# Patient Record
Sex: Male | Born: 1965
Health system: Southern US, Community
[De-identification: ages and names within clinical notes are randomized; demographics above are authoritative.]

## PROBLEM LIST (undated history)

## (undated) DIAGNOSIS — M8700 Idiopathic aseptic necrosis of unspecified bone: Secondary | ICD-10-CM

## (undated) DIAGNOSIS — I10 Essential (primary) hypertension: Secondary | ICD-10-CM

## (undated) DIAGNOSIS — D573 Sickle-cell trait: Secondary | ICD-10-CM

## (undated) DIAGNOSIS — D869 Sarcoidosis, unspecified: Secondary | ICD-10-CM

## (undated) DIAGNOSIS — D571 Sickle-cell disease without crisis: Secondary | ICD-10-CM

## (undated) HISTORY — DX: Sarcoidosis, unspecified: D86.9

## (undated) HISTORY — DX: Sickle-cell trait: D57.3

---

## 1998-05-14 ENCOUNTER — Emergency Department (HOSPITAL_COMMUNITY): Admission: EM | Admit: 1998-05-14 | Discharge: 1998-05-14 | Payer: Self-pay | Admitting: Emergency Medicine

## 2000-02-23 ENCOUNTER — Emergency Department (HOSPITAL_COMMUNITY): Admission: EM | Admit: 2000-02-23 | Discharge: 2000-02-23 | Payer: Self-pay | Admitting: Emergency Medicine

## 2002-09-14 ENCOUNTER — Encounter: Admission: RE | Admit: 2002-09-14 | Discharge: 2002-09-14 | Payer: Self-pay | Admitting: Family Medicine

## 2002-09-14 ENCOUNTER — Encounter: Payer: Self-pay | Admitting: Family Medicine

## 2003-11-01 ENCOUNTER — Encounter: Admission: RE | Admit: 2003-11-01 | Discharge: 2003-11-01 | Payer: Self-pay | Admitting: Family Medicine

## 2004-02-10 ENCOUNTER — Emergency Department (HOSPITAL_COMMUNITY): Admission: EM | Admit: 2004-02-10 | Discharge: 2004-02-11 | Payer: Self-pay | Admitting: Emergency Medicine

## 2004-02-19 ENCOUNTER — Emergency Department (HOSPITAL_COMMUNITY): Admission: EM | Admit: 2004-02-19 | Discharge: 2004-02-19 | Payer: Self-pay | Admitting: *Deleted

## 2004-05-22 ENCOUNTER — Emergency Department (HOSPITAL_COMMUNITY): Admission: EM | Admit: 2004-05-22 | Discharge: 2004-05-22 | Payer: Self-pay | Admitting: Emergency Medicine

## 2004-06-03 ENCOUNTER — Ambulatory Visit: Admission: RE | Admit: 2004-06-03 | Discharge: 2004-06-03 | Payer: Self-pay | Admitting: Internal Medicine

## 2004-06-03 ENCOUNTER — Encounter (INDEPENDENT_AMBULATORY_CARE_PROVIDER_SITE_OTHER): Payer: Self-pay | Admitting: Specialist

## 2004-09-29 ENCOUNTER — Ambulatory Visit: Payer: Self-pay | Admitting: Internal Medicine

## 2004-10-09 ENCOUNTER — Emergency Department (HOSPITAL_COMMUNITY): Admission: EM | Admit: 2004-10-09 | Discharge: 2004-10-09 | Payer: Self-pay | Admitting: Emergency Medicine

## 2004-10-19 ENCOUNTER — Emergency Department (HOSPITAL_COMMUNITY): Admission: EM | Admit: 2004-10-19 | Discharge: 2004-10-19 | Payer: Self-pay | Admitting: Emergency Medicine

## 2004-11-17 ENCOUNTER — Ambulatory Visit: Payer: Self-pay | Admitting: Internal Medicine

## 2004-12-17 ENCOUNTER — Ambulatory Visit: Payer: Self-pay | Admitting: Internal Medicine

## 2005-04-07 ENCOUNTER — Ambulatory Visit: Payer: Self-pay | Admitting: Internal Medicine

## 2005-05-24 ENCOUNTER — Ambulatory Visit: Payer: Self-pay | Admitting: Internal Medicine

## 2005-07-15 ENCOUNTER — Ambulatory Visit: Payer: Self-pay | Admitting: Internal Medicine

## 2005-08-16 ENCOUNTER — Ambulatory Visit: Payer: Self-pay | Admitting: Internal Medicine

## 2005-10-04 HISTORY — PX: SHOULDER OPEN ROTATOR CUFF REPAIR: SHX2407

## 2006-06-02 ENCOUNTER — Ambulatory Visit: Payer: Self-pay | Admitting: Internal Medicine

## 2006-06-23 ENCOUNTER — Ambulatory Visit: Payer: Self-pay | Admitting: Internal Medicine

## 2006-07-15 ENCOUNTER — Ambulatory Visit (HOSPITAL_COMMUNITY): Admission: RE | Admit: 2006-07-15 | Discharge: 2006-07-15 | Payer: Self-pay | Admitting: Orthopaedic Surgery

## 2006-08-16 ENCOUNTER — Encounter: Admission: RE | Admit: 2006-08-16 | Discharge: 2006-09-22 | Payer: Self-pay | Admitting: Orthopaedic Surgery

## 2007-11-10 DIAGNOSIS — D869 Sarcoidosis, unspecified: Secondary | ICD-10-CM | POA: Insufficient documentation

## 2007-11-27 ENCOUNTER — Emergency Department (HOSPITAL_COMMUNITY): Admission: EM | Admit: 2007-11-27 | Discharge: 2007-11-27 | Payer: Self-pay | Admitting: Emergency Medicine

## 2007-12-08 ENCOUNTER — Ambulatory Visit: Payer: Self-pay | Admitting: Internal Medicine

## 2007-12-25 ENCOUNTER — Ambulatory Visit: Payer: Self-pay | Admitting: Internal Medicine

## 2008-02-01 ENCOUNTER — Encounter: Admission: RE | Admit: 2008-02-01 | Discharge: 2008-02-01 | Payer: Self-pay | Admitting: Family Medicine

## 2008-02-01 ENCOUNTER — Ambulatory Visit: Payer: Self-pay | Admitting: Family Medicine

## 2008-02-01 ENCOUNTER — Encounter (INDEPENDENT_AMBULATORY_CARE_PROVIDER_SITE_OTHER): Payer: Self-pay | Admitting: Family Medicine

## 2008-02-01 DIAGNOSIS — G4700 Insomnia, unspecified: Secondary | ICD-10-CM | POA: Insufficient documentation

## 2008-02-02 LAB — CONVERTED CEMR LAB
AST: 20 units/L (ref 0–37)
Albumin: 4.5 g/dL (ref 3.5–5.2)
Chlamydia, Swab/Urine, PCR: NEGATIVE
Cholesterol: 196 mg/dL (ref 0–200)
GC Probe Amp, Urine: NEGATIVE
HCT: 45 % (ref 39.0–52.0)
HDL: 91 mg/dL (ref >39)
Hemoglobin: 16 g/dL (ref 13.0–17.0)
MCHC: 35.6 g/dL (ref 30.0–36.0)
PSA: 1.61 ng/mL (ref 0.10–4.00)
Platelets: 246 10*3/uL (ref 150–400)
Potassium: 4.2 meq/L (ref 3.5–5.3)
RDW: 14.3 % (ref 11.5–15.5)
TSH: 1.729 microintl units/mL (ref 0.350–5.50)
Total CHOL/HDL Ratio: 2.2
VLDL: 13 mg/dL (ref 0–40)

## 2008-08-07 ENCOUNTER — Encounter (INDEPENDENT_AMBULATORY_CARE_PROVIDER_SITE_OTHER): Payer: Self-pay | Admitting: Family Medicine

## 2008-12-13 ENCOUNTER — Ambulatory Visit: Payer: Self-pay | Admitting: Adult Health

## 2008-12-13 ENCOUNTER — Ambulatory Visit: Payer: Self-pay | Admitting: Pulmonary Disease

## 2008-12-13 ENCOUNTER — Encounter: Payer: Self-pay | Admitting: Gastroenterology

## 2008-12-17 ENCOUNTER — Encounter: Payer: Self-pay | Admitting: Adult Health

## 2008-12-17 ENCOUNTER — Telehealth: Payer: Self-pay | Admitting: Adult Health

## 2008-12-17 LAB — CONVERTED CEMR LAB: Angiotensin 1 Converting Enzyme: 55 units/L (ref 9–67)

## 2008-12-18 ENCOUNTER — Ambulatory Visit: Payer: Self-pay | Admitting: Internal Medicine

## 2009-01-02 ENCOUNTER — Ambulatory Visit: Payer: Self-pay | Admitting: Internal Medicine

## 2009-01-02 DIAGNOSIS — J4550 Severe persistent asthma, uncomplicated: Secondary | ICD-10-CM | POA: Insufficient documentation

## 2009-01-20 ENCOUNTER — Ambulatory Visit: Payer: Self-pay | Admitting: Family Medicine

## 2009-01-20 ENCOUNTER — Encounter (INDEPENDENT_AMBULATORY_CARE_PROVIDER_SITE_OTHER): Payer: Self-pay | Admitting: Family Medicine

## 2009-01-20 DIAGNOSIS — J3089 Other allergic rhinitis: Secondary | ICD-10-CM | POA: Insufficient documentation

## 2009-01-20 DIAGNOSIS — F1010 Alcohol abuse, uncomplicated: Secondary | ICD-10-CM | POA: Insufficient documentation

## 2009-01-21 LAB — CONVERTED CEMR LAB
Albumin: 4.6 g/dL (ref 3.5–5.2)
Alkaline Phosphatase: 52 units/L (ref 39–117)
BUN: 17 mg/dL (ref 6–23)
Calcium: 10.1 mg/dL (ref 8.4–10.5)
Creatinine, Ser: 0.98 mg/dL (ref 0.40–1.50)
Glucose, Bld: 118 mg/dL — ABNORMAL HIGH (ref 70–99)
MCV: 81.1 fL (ref 78.0–100.0)
RBC: 5.56 M/uL (ref 4.22–5.81)
RDW: 14.4 % (ref 11.5–15.5)
Sodium: 139 meq/L (ref 135–145)
WBC: 6 10*3/uL (ref 4.0–10.5)

## 2009-02-07 ENCOUNTER — Ambulatory Visit: Payer: Self-pay | Admitting: Internal Medicine

## 2009-11-03 ENCOUNTER — Encounter: Payer: Self-pay | Admitting: Family Medicine

## 2009-11-21 ENCOUNTER — Encounter: Payer: Self-pay | Admitting: *Deleted

## 2009-11-21 ENCOUNTER — Ambulatory Visit: Payer: Self-pay | Admitting: Family Medicine

## 2009-12-04 ENCOUNTER — Encounter: Payer: Self-pay | Admitting: *Deleted

## 2010-03-04 ENCOUNTER — Encounter: Payer: Self-pay | Admitting: Internal Medicine

## 2010-03-04 ENCOUNTER — Ambulatory Visit: Payer: Self-pay | Admitting: Internal Medicine

## 2010-03-09 ENCOUNTER — Encounter: Payer: Self-pay | Admitting: Internal Medicine

## 2010-03-13 ENCOUNTER — Telehealth: Payer: Self-pay | Admitting: Internal Medicine

## 2010-03-18 ENCOUNTER — Telehealth: Payer: Self-pay | Admitting: Family Medicine

## 2010-03-19 ENCOUNTER — Ambulatory Visit: Payer: Self-pay | Admitting: Family Medicine

## 2010-03-19 ENCOUNTER — Encounter: Payer: Self-pay | Admitting: Family Medicine

## 2010-03-19 ENCOUNTER — Encounter: Payer: Self-pay | Admitting: *Deleted

## 2010-03-20 ENCOUNTER — Encounter: Payer: Self-pay | Admitting: *Deleted

## 2010-03-20 ENCOUNTER — Telehealth: Payer: Self-pay | Admitting: *Deleted

## 2010-04-13 ENCOUNTER — Ambulatory Visit: Payer: Self-pay | Admitting: Internal Medicine

## 2010-05-13 ENCOUNTER — Telehealth: Payer: Self-pay | Admitting: Internal Medicine

## 2010-07-26 ENCOUNTER — Encounter: Payer: Self-pay | Admitting: Family Medicine

## 2010-10-06 ENCOUNTER — Ambulatory Visit: Admit: 2010-10-06 | Payer: Self-pay | Admitting: Internal Medicine

## 2010-10-19 ENCOUNTER — Encounter: Payer: Self-pay | Admitting: Family Medicine

## 2010-10-19 ENCOUNTER — Ambulatory Visit: Admission: RE | Admit: 2010-10-19 | Discharge: 2010-10-19 | Payer: Self-pay | Source: Home / Self Care

## 2010-10-19 DIAGNOSIS — M25569 Pain in unspecified knee: Secondary | ICD-10-CM | POA: Insufficient documentation

## 2010-10-19 LAB — CONVERTED CEMR LAB
Chloride: 101 meq/L (ref 96–112)
Glucose, Bld: 84 mg/dL (ref 70–99)
HCT: 44 % (ref 39.0–52.0)
HIV: NONREACTIVE
MCV: 80.1 fL (ref 78.0–100.0)
Platelets: 216 10*3/uL (ref 150–400)
Potassium: 4.3 meq/L (ref 3.5–5.3)
RBC: 5.49 M/uL (ref 4.22–5.81)
RDW: 14.1 % (ref 11.5–15.5)
Sodium: 138 meq/L (ref 135–145)
Total Protein: 7.9 g/dL (ref 6.0–8.3)

## 2010-10-21 ENCOUNTER — Encounter: Payer: Self-pay | Admitting: Family Medicine

## 2010-11-03 ENCOUNTER — Ambulatory Visit: Admit: 2010-11-03 | Payer: Self-pay

## 2010-11-03 NOTE — Assessment & Plan Note (Signed)
Summary: Pulmonary/ ext f/u ov with hfa teaching at 75%   Primary Provider/Referring Provider:  Marisue Ivan  MD  CC:  SOB/Wheezing/...paztient states he is working with chenicals and SOB has been worsened....patient  states he just found out he has been exposed to asbestos for last 5 yrs.  History of Present Illness: 45 yobm former smoker quit around 1995  exp to second hand smoke only dx with sarcoid 8/05 by tbbx on prednisone off and on since then with inconsistent adherence documented.   December 13, 2008--Last seen 3/09- did not return for follow up. Since last viist. Tapered off prednisone, he has taken on/off over last year. Would take when he was dyspneic, wheezing/ and cough. Over last 2 weeks increased cough, tightness, dyspnea, and wheezing. Almost out of prednisone. --CXR hyperinflation of the lungs with enlarged hilar tissue.  The hilar tissue has minimally changed. ACE at 55. Prendisone restarted at 20mg  once daily   December 18, 2008-- Returns compalining that breathing  no better. still has increased SOB since last OV, wheezing, prod cough with white sputum - worse since last OV. Pt misunderstood instructions, only on prednisone 10mg  once daily. Rec 20 mg per day  January 02, 2009 still on 20 mg per day and finally turning the corner with less sob and cough.  rec Prednisone 10 mg 2 each am until 100% better, then 1 daily x 2weeks then one half daily until seen  Feb 07, 2009 ov better sob, much less doe, less SABA use, not using symbicort consistently and so hasn't used up the 80 yet thought should have long before ov.     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 Pt denies any significant sore throat, dysphagia, itching, sneezing,  nasal congestion or excess secretions,  fever, chills, sweats, unintended wt loss, pleuritic or exertional cp,  hempoptysis, change in activity tolerance  orthopnea pnd or leg swelling. increasing need for saba x sev weeks, less responsive now and therefore increased prednisone to 20 mg per day x 2 weeks, minimally better.  Current Medications (verified): 1)  Prednisone 10 Mg  Tabs (Prednisone) .... One Half Daily 2)  Ventolin Hfa 108 (90 Base) Mcg/act Aers (Albuterol Sulfate) .... 2 Puffs Every 4 Hours As Needed  Allergies (verified): No Known Drug Allergies  Past History:  Past Medical History: SARCOIDOSIS (ICD-135).........................................................Marland KitchenWert    - FOB with lung biopsy 2005 positive NCG> variably on prednisone since then Sickle Cell Trait Asthma   - MDI 50% March 04, 2010   Family History: Reviewed history from 02/01/2008 and no changes required. Pos asthma, mother Mother - Sickle Cell Disease, Asthma, CAD s/p MI (prior to 43 yo), s/p CVA (frequent hospitalizations) Father - passed in 45 (59 yo) from Prostate Cancer  4 siblings Sister - multiple medical problems - details unk Brother - ? Cancer Brother - died 2/2 drug abuse  Social History: Married.  Lives with wife [name] and kids.  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  Quit smoking 1995.  Drinks 1 bottle Brandy every other day.  No drugs or hx of IVDU.  Does lots of walking with job, but no regular exercise.  Vital Signs:  Patient profile:   45 year old male Height:      68.5 inches Weight:  141 pounds O2 Sat:      97 % on Room air Temp:     97.9 degrees F Pulse rate:   116 / minute  Vitals Entered By: Vernie Murders (March 04, 2010 10:29 AM)  O2 Sat at Rest %:  97% O2 Flow:  Room air  Reason for Visit patients states SOB has worsened since he has been around chemicals x 1 month, pt states he chest feels tight, coughing which is productive and acconmpanied by brown sputum. pt c/o of feeling lightheaded at end of work day. Vernie Murders  March 04, 2010 10:32 AM   Physical Exam  Additional Exam:  Ambulatory healthy appearing in no acute distress.  wt 142 > 143 Feb 08, 2009 > 141 March 04, 2010  HEENT: nl dentition, turbinates, and orophanx. Nl external ear canals without cough reflex Neck without JVD/Nodes/TM Lungs bilateral mid  exp wheezes and mod increase exp time RRR no s3 or murmur or increase in P2 Abd soft and benign with nl excursion in the supine position. No bruits or organomegaly Ext warm without calf tenderness, cyanosis clubbing or edema Skin warm and dry without lesions   CXR  Procedure date:  03/04/2010  Findings:      IMPRESSION: Similar bilateral hilar soft tissue fullness.  Likely related to the history of sarcoidosis.  No acute superimposed process identified.  Impression & Recommendations:  Problem # 1:  ASTHMA (ICD-493.90)  DDX of  difficult airways managment all start with A and  include Adherence, Ace Inhibitors, Acid Reflux, Active Sinus Disease, Alpha 1 Antitripsin deficiency, Anxiety masquerading as Airways dz,  ABPA,  allergy(esp in young), Aspiration (esp in elderly), Adverse effects of DPI,  Active smokers, plus one B  = Beta blocker use..   In this case Adherence is the biggest issue and starts with  inability to use HFA effectively and also  understand that SABA treats the symptoms but doesn't get to the underlying problem (inflammation).  I used  the ananology of putting steroid cream on a rash to help explain the meaning of topical therapy and the need to get the drug to the target tissue.  Rec challenge with dulera 200  I spent extra time with the patient today explaining optimal mdi  technique.  This improved from  25-75%  Problem # 2:  SARCOIDOSIS (ICD-135) No evidence of progression of ild but clearly this could be the cause of poor airway control  The goal with a chronic steroid dependent illness is always arriving at the lowest effective dose that controls the disease/symptoms and not  accepting a set "formula" which is based on statistics that don't take into accound individual variability or the natural hx of the dz in every individual patient, which may well vary over time. for now use ceiling of 20 and floor of 10  Medications Added to Medication List This Visit: 1)  Dulera 200-5 Mcg/act Aero (Mometasone furo-formoterol fum) .... 2 puffs first thing  in am and 2 puffs again in pm about 12 hours later 2)  Prednisone 10 Mg Tabs (Prednisone) .... 2 daily until better then taper to one half daily once better 3)  Proair Hfa 108 (90 Base) Mcg/act Aers (Albuterol sulfate) .Marland Kitchen.. 1-2 puffs every 4-6 hours as needed  Other Orders: T-2 View CXR (71020TC) Est. Patient Level IV ([address])  Patient Instructions: 1)  Start dulera 200/5 2 puffs first thing  in am and 2 puffs again in pm about  12 hours later  2)  Work on inhaler technique:  relax and blow all the way out then take a nice smooth deep breath back in, triggering the inhaler at same time you start breathing in  3)  ok to use proaire 2 puffs every 4 hours if needed 4)  Prednisone 10 mg 2 daily until better then taper to onehalf daily 5)  Please schedule a follow-up appointment in 4 weeks, sooner if needed with cxr on return  Prevention & Chronic Care Immunizations   Influenza vaccine: Not documented    Tetanus booster: Not documented    Pneumococcal vaccine: Not documented  Other Screening   Smoking status: never  (11/03/2009)  Lipids   Total Cholesterol: 196  (02/01/2008)   LDL: 92  (02/01/2008)   LDL Direct: Not documented   HDL: 91  (02/01/2008)   Triglycerides: 63  (02/01/2008)    CXR  Procedure date:  03/04/2010  Findings:      IMPRESSION: Similar bilateral hilar soft tissue fullness.  Likely related to the history of sarcoidosis.  No acute superimposed process identified.  Appended Document: Pulmonary/ ext f/u ov with hfa teaching at 75% Reviewed

## 2010-11-03 NOTE — Assessment & Plan Note (Signed)
Summary: pt no show   Primary Provider/Referring Provider:  Marisue Ivan  MD   History of Present Illness: pt no show  Allergies: No Known Drug Allergies   Other Orders: No Charge Patient Arrived (NCPA0) (NCPA0)

## 2010-11-03 NOTE — Assessment & Plan Note (Signed)
Summary: Reschedule CPE in 01/2010   Vital Signs:  Patient profile:   45 year old male Height:      68.5 inches Weight:      142 pounds BMI:     21.35 O2 Sat:      96 % on Room air Temp:     98.1 degrees F oral Pulse rate:   102 / minute BP sitting:   141 / 96  (left arm) Cuff size:   regular  Vitals Entered By: Gladstone Pih (November 03, 2009 2:23 PM)  O2 Flow:  Room air CC: CPE, C/O wheezing Is Patient Diabetic? No Pain Assessment Patient in pain? no        CC:  CPE and C/O wheezing.  Habits & Providers  Alcohol-Tobacco-Diet     Tobacco Status: never  Allergies: No Known Drug Allergies   Complete Medication List: 1)  Prednisone 10 Mg Tabs (Prednisone) .... One half daily 2)  Symbicort 160-4.5 Mcg/act Aero (Budesonide-formoterol fumarate) .... 2 puffs inhaled two times a day - brush teeth after using 3)  Ventolin Hfa 108 (90 Base) Mcg/act Aers (Albuterol sulfate) .... 2 puffs every 4 hours as needed 4)  Aerochamber Plus Misc (Spacer/aero-holding chambers) .... Please dispense appropriate spacer to use with hfa inhaler - instruct on proper use  Other Orders: Pulse Oximetry- [facility] (94760) No Charge Patient Arrived (NCPA0) (NCPA0) Prescriptions: SYMBICORT 160-4.5 MCG/ACT AERO (BUDESONIDE-FORMOTEROL FUMARATE) 2 puffs inhaled two times a day - brush teeth after using  #1 x 2   Entered and Authorized by:   Marisue Ivan  MD   Signed by:   Marisue Ivan  MD on 11/03/2009   Method used:   Electronically to        [facility] [phone]* (retail)       [address]       [place], [place]  [address]       Ph: [phone]       Fax: [phone]   RxID:   [id number]

## 2010-11-03 NOTE — Letter (Signed)
Summary: Out of Work  [facility]  [address]   [place], [place] [address]   Phone: [phone]  Fax: [phone]    April 13, 2010   Employee:  [name]    To Whom It May Concern:   For Medical reasons, please excuse the above named employee from work for the following dates:  Start:   July 11th, 2011  End:   July 11th, 2011  If you need additional information, please feel free to contact our office.         Sincerely,    Sandrea Hughs, MD

## 2010-11-03 NOTE — Letter (Signed)
Summary: Out of Work  [facility]  [address]   [place], [place] [address]   Phone: [phone]  Fax: [phone]    November 21, 2009   Employee:  [name]    To Whom It May Concern:   For Medical reasons, please excuse the above named employee from work for the following dates:  Start:   11/21/2009    End:   11/24/2009  If you need additional information, please feel free to contact our office.         Sincerely,    Loralee Pacas CMA

## 2010-11-03 NOTE — Miscellaneous (Signed)
Summary: ROI/TS  Clinical Lists Changes ROI FAXED TO [facility].Arlyss Repress CMA,  March 19, 2010 10:33 AM

## 2010-11-03 NOTE — Miscellaneous (Signed)
  Clinical Lists Changes  Problems: Changed problem from ASTHMA (ICD-493.90) to ASTHMA, PERSISTENT (ICD-493.90) 

## 2010-11-03 NOTE — Letter (Signed)
Summary: Generic Electronics engineer Pulmonary  [address]   [place], [place] [address]   Phone: [phone]  Fax: [phone]    03/09/2010  [facility] [address] [place], [place]  [address]  Dear Mr. Claycomb,   We have tried to reach you with recent test results, but were unfortunately unable to speak with you.  Please contact our office for your results at [phone].  Thank You.        Sincerely,   Nature conservation officer Pulmonary Division

## 2010-11-03 NOTE — Progress Notes (Signed)
Summary: nos appt  Phone Note Call from Patient   Caller: [email or web address]  Call For: wert Summary of Call: LMTCB x2 to rsc nos from 8/9. Initial call taken by: Darletta Moll,  May 13, 2010 3:17 PM

## 2010-11-03 NOTE — Miscellaneous (Signed)
Summary: heel cushions  Clinical Lists Changes  Orders: Added new Service order of Heel Cushions [phone]) - Signed.Loralee Pacas CMA  December 04, 2009 9:32 AM

## 2010-11-03 NOTE — Progress Notes (Signed)
Summary: note needed   Phone Note Call from Patient Call back at Home Phone [phone]   Caller: Patient Summary of Call: Pt needs note saying he is going back to work Monday.  Was seen yesterday.  Fax to [phone]. Initial call taken by: Clydell Hakim,  March 20, 2010 9:11 AM    note written and faxed to number given.Loralee Pacas CMA  March 20, 2010 5:05 PM

## 2010-11-03 NOTE — Assessment & Plan Note (Signed)
Summary: feet feel like they have needles in them,tcb   Vital Signs:  Patient profile:   45 year old male Weight:      143.9 pounds Temp:     98.2 degrees F oral Pulse rate:   81 / minute Pulse rhythm:   regular BP sitting:   146 / 96  (right arm) Cuff size:   regular  Vitals Entered By: Loralee Pacas CMA (November 21, 2009 8:53 AM) CC: foot pain Pain Assessment Patient in pain? yes     Location: foot Intensity: 8 Comments left foot pain starts at high arch and radiates into his heel,  makes it difficult to walk and pain is worse at night.   Primary Care Provider:  Marisue Ivan  MD  CC:  foot pain.  History of Present Illness: 1.  foot pain--left heel.  past 3 weeks.  feels like a toothache.  worse first thing in the morning when he tries to step on it.  does a lot of walking at work.  has not tried any meds.  has tried soaking in epsom salt, which helps a little.   Pt agreed to video precepting.Loralee Pacas CMA  November 21, 2009 9:03 AM  Allergies: No Known Drug Allergies  Review of Systems General:  Denies fever and malaise. MS:  Denies joint pain, joint swelling, and loss of strength.  Physical Exam  General:  Well-developed,well-nourished,in no acute distress; alert,appropriate and cooperative throughout examination Msk:  normal stance.  limping gait, favoring right.  left foot/ankle:  ttp over the heel.  full rom of ankle.  no swelling or redness of foot.   Additional Exam:  vital signs reviewed    Impression & Recommendations:  Problem # 1:  PLANTAR FASCIITIS, LEFT (ICD-728.71) Assessment New  symptoms consistent with plantar fasciitis.  conservative tx with:  nsaids, ice, exercises.  gave pt handout that includes stretching exercises.  gave heel cups to try.  advised good athletic shoes.  advised supportive shoes even when inside house.  rtc 3 weeks.   His updated medication list for this problem includes:    Ibuprofen 800 Mg Tabs (Ibuprofen) .Marland Kitchen... 1  tab by mouth three times a day scheduled for 7 days.  then take three times a day as needed for foot pain  Orders: [facility]- Est Level  3 ([address])  Complete Medication List: 1)  Prednisone 10 Mg Tabs (Prednisone) .... One half daily 2)  Symbicort 160-4.5 Mcg/act Aero (Budesonide-formoterol fumarate) .... 2 puffs inhaled two times a day - brush teeth after using 3)  Ventolin Hfa 108 (90 Base) Mcg/act Aers (Albuterol sulfate) .... 2 puffs every 4 hours as needed 4)  Aerochamber Plus Misc (Spacer/aero-holding chambers) .... Please dispense appropriate spacer to use with hfa inhaler - instruct on proper use 5)  Ibuprofen 800 Mg Tabs (Ibuprofen) .Marland Kitchen.. 1 tab by mouth three times a day scheduled for 7 days.  then take three times a day as needed for foot pain  Other Orders: Heel Cushions-[facility] (U0454)  Patient Instructions: 1)  It was nice to see you today. 2)  Take the ibuprofen three times a day for the next 7 days with food.  then take three times a day as needed. 3)  Ice your foot three times a day. 4)  Try the exercises I gave you. 5)  Please schedule a follow-up appointment in 3 weeks.   If you are considerably better in 2 weeks, you can call and cancel.    Prescriptions: IBUPROFEN  800 MG TABS (IBUPROFEN) 1 tab by mouth three times a day scheduled for 7 days.  Then take three times a day as needed for foot pain  #90 x 1   Entered and Authorized by:   Asher Muir MD   Signed by:   Asher Muir MD on 11/21/2009   Method used:   Electronically to        [facility] [address] [phone]* (retail)       [address]       [place], [place]  [address]       Ph: [phone]       Fax: [phone]   RxID:   [id number]   Appended Document: feet feel like they have needles in them,tcb call from pharmacy that interaction between ibuprofen and inhaled steroids.  will change to naprosyn.   Clinical Lists Changes  Medications: Removed medication of IBUPROFEN 800 MG TABS (IBUPROFEN) 1 tab by  mouth three times a day scheduled for 7 days.  Then take three times a day as needed for foot pain Added new medication of NAPROSYN 500 MG TABS (NAPROXEN) 1 tab by mouth two times a day as needed pain.  dispense generic - Signed Rx of NAPROSYN 500 MG TABS (NAPROXEN) 1 tab by mouth two times a day as needed pain.  dispense generic;  #60 x 1;  Signed;  Entered by: Asher Muir MD;  Authorized by: Asher Muir MD;  Method used: Electronically to [facility] [address] [phone]*, [address] [place], [place]  [address], Ph: [phone], Fax: [phone]    Prescriptions: NAPROSYN 500 MG TABS (NAPROXEN) 1 tab by mouth two times a day as needed pain.  dispense generic  #60 x 1   Entered and Authorized by:   Asher Muir MD   Signed by:   Asher Muir MD on 11/21/2009   Method used:   Electronically to        [facility] [address] [phone]* (retail)       [address]       [place], [place]  [address]       Ph: [phone]       Fax: [phone]   RxID:   [id number]

## 2010-11-03 NOTE — Progress Notes (Signed)
Summary: returning call  Phone Note Call from Patient   Caller: Patient Call For: Terrence Wishon Summary of Call: Returning Leslie's call, can be reached at: [phone] or [phone]. Initial call taken by: Darletta Moll,  March 13, 2010 9:04 AM  Follow-up for Phone Call        pt advised of cxr resutls per append. He states numbers in EMR are correct but he can also be reached at the 2 numbers given in phone note today. I have added these to pt reg. Carron Curie CMA  March 13, 2010 9:19 AM

## 2010-11-03 NOTE — Letter (Signed)
Summary: Out of Work  [facility]  [address]   [place], [place] [address]   Phone: [phone]  Fax: [phone]    March 19, 2010   Employee:  LAURENS MATHENY    To Whom It May Concern:   For Medical reasons, please excuse the above named employee from work for the following dates:  Start:   March 19, 2010  End:   March 19, 2010  If you need additional information, please feel free to contact our office.         Sincerely,    Milinda Antis MD

## 2010-11-03 NOTE — Letter (Signed)
Summary: Work Excuse  [facility]  [address]   [place], [place] [address]   Phone: [phone]  Fax: [phone]    Today's Date: March 20, 2010  Name of Patient: [name]  The above named patient had a medical visit today at:  am / pm.  Please take this into consideration when reviewing the time away from work/school.    Special Instructions:  [  ] None  [  ] To be off the remainder of today, returning to the normal work / school schedule tomorrow.  [  ] To be off until the next scheduled appointment on ______________________.  [ x] Other:  Mr. Alesi can return to work on Monday March 23, 2010   Sincerely yours,   Loralee Pacas CMA

## 2010-11-03 NOTE — Progress Notes (Signed)
Summary: triage  Phone Note Call from Patient Call back at [phone]   Caller: Patient Summary of Call: Pt has a cracked tooth and needs referral to [facility] as soon as possible. Initial call taken by: Clydell Hakim,  March 18, 2010 9:58 AM  Follow-up for Phone Call        explained he will need a visit as they only take emergency pts who are on pain meds & antibiotics. will be here at 8:30am. meantime is taking ibu 800mg  q6h and tylenl & vicodin /oxycodine. Follow-up by: Golden Circle RN,  March 18, 2010 10:01 AM

## 2010-11-03 NOTE — Assessment & Plan Note (Signed)
Summary: broken tooth/[place]   Vital Signs:  Patient profile:   45 year old male Height:      68.75 inches Weight:      142.9 pounds BMI:     21.33 Temp:     98.2 degrees F Pulse rate:   85 / minute BP sitting:   134 / 101  Vitals Entered By: Golden Circle RN (March 19, 2010 8:35 AM)  Serial Vital Signs/Assessments:  Time      Position  BP       Pulse  Resp  Temp     By                     130/86                         Milinda Antis MD   Primary Care Provider:  Marisue Ivan  MD  CC:  Tooth Pain.  History of Present Illness:   Pt presents for tooth pain. 2 weeks ago,filling in right bottom tooth came out after eating "fat-back". Since then has had pain in tooth and gums, with some bleeding. Denies any pus or drainage from gums. Tried to call the [facility] but could not be seen as pt has medical insurance, but does not have dental insurance  ROS- denies fever, sore throat, jaw pain   Note records to be obtained from [facility] as pt being seen for foot injury and shoulder injury and asked if we would write his chronic pain meds. I told him we would have to see medical records prior to making that decision  Habits & Providers  Alcohol-Tobacco-Diet     Alcohol drinks/day: 1     Alcohol type: beer     Tobacco Status: quit > 6 months  Exercise-Depression-Behavior     Does Patient Exercise: yes     Type of exercise: AEROBIC     STD Risk: never     Seat Belt Use: always     Sun Exposure: frequent  Current Medications (verified): 1)  Dulera 200-5 Mcg/act Aero (Mometasone Furo-Formoterol Fum) .... 2 Puffs First Thing  in Am and 2 Puffs Again in Pm About 12 Hours Later 2)  Prednisone 10 Mg  Tabs (Prednisone) .... 2 Daily Until Better Then Taper To One Half Daily Once Better 3)  Proair Hfa 108 (90 Base) Mcg/act  Aers (Albuterol Sulfate) .Marland Kitchen.. 1-2 Puffs Every 4-6 Hours As Needed 4)  Hydrocodone-Acetaminophen 5-500 Mg Tabs  (Hydrocodone-Acetaminophen) .Marland Kitchen.. 1 By Mouth Q 4-6 Hours As Needed Pain 5)  Penicillin V Potassium 500 Mg Tabs (Penicillin V Potassium) .Marland Kitchen.. 1 By Mouth Three Times A Day X 10 Days Take 1 Hour Before Meals  Allergies (verified): No Known Drug Allergies  Social History: Smoking Status:  quit > 6 months Does Patient Exercise:  yes Seat Belt Use:  always Sun Exposure-Excessive:  frequent STD Risk:  never  Physical Exam  General:  NAD, alert Vital signs noted BP rechecked Mouth:  Poor denition - missing many teeth, gingivial inflammation, gums TTP no bleeding on exam, yellow plaque-like lesions on lower gumline Mandibular row- right side3rd tooth from incisors 1/2 of tooth missing with noted hole in middle of tooth, tooth was loose with palpation  Pharynx pink and moist Neck:  Supple  Lungs:  CTAB Cervical Nodes:  no anterior cervical adenopathy and no posterior cervical adenopathy.     Impression & Recommendations:  Problem # 1:  DENTAL PAIN (ICD-525.9) Assessment New  Pt with multiple dental caries as well as gingival disease, with new tooth and very poor dentition concern for developing infection. pt to call dentist for payment plan. Will give PCN and short term hydrocodone  Orders: [facility]- Est Level  3 ([address])  Complete Medication List: 1)  Dulera 200-5 Mcg/act Aero (Mometasone furo-formoterol fum) .... 2 puffs first thing  in am and 2 puffs again in pm about 12 hours later 2)  Prednisone 10 Mg Tabs (Prednisone) .... 2 daily until better then taper to one half daily once better 3)  Proair Hfa 108 (90 Base) Mcg/act Aers (Albuterol sulfate) .Marland Kitchen.. 1-2 puffs every 4-6 hours as needed 4)  Hydrocodone-acetaminophen 5-500 Mg Tabs (Hydrocodone-acetaminophen) .Marland Kitchen.. 1 by mouth q 4-6 hours as needed pain 5)  Penicillin V Potassium 500 Mg Tabs (Penicillin v potassium) .Marland Kitchen.. 1 by mouth three times a day x 10 days take 1 hour before meals  Patient Instructions: 1)  Please call for a dentist  for a payment plan 2)  We have given you a handout for dentist in the area 3)  Take the antibiotics and pain medications 4)  Return if you have incraeased, pain, fever or swelling in your jaw Prescriptions: PENICILLIN V POTASSIUM 500 MG TABS (PENICILLIN V POTASSIUM) 1 by mouth three times a day x 10 days take 1 hour before meals  #30 x 0   Entered and Authorized by:   Milinda Antis MD   Signed by:   Milinda Antis MD on 03/19/2010   Method used:   Electronically to        [facility] [phone]* (retail)       [address]       [place], [place]  [address]       Ph: [phone]       Fax: [phone]   RxID:   [id number] HYDROCODONE-ACETAMINOPHEN 5-500 MG TABS (HYDROCODONE-ACETAMINOPHEN) 1 by mouth q 4-6 hours as needed pain  #60 x 0   Entered by:   Milinda Antis MD   Authorized by:   Marland Kitchen FPCDEFAULTPROVIDER   Signed by:   Milinda Antis MD on 03/19/2010   Method used:   Handwritten   RxID:   [id number]

## 2010-11-03 NOTE — Letter (Signed)
Summary: Out of Work  [employer]  [address]   [place], [place] [address]   Phone: [phone]  Fax: [phone]    March 04, 2010   Employee:  [name]    To Whom It May Concern:   For Medical reasons, please excuse the above named employee from work for the following dates:  Start:   June 1st, 2011  End:   June 2nd, 2011  If you need additional information, please feel free to contact our office.         Sincerely,    Sandrea Hughs, MD

## 2010-11-05 NOTE — Assessment & Plan Note (Signed)
Summary: cpe/kh   Vital Signs:  Patient profile:   45 year old male Height:      68.75 inches Weight:      149.56 pounds BMI:     22.33 Temp:     98.4 degrees F oral Pulse rate:   83 / minute BP sitting:   105 / 70  (right arm)  Vitals Entered By: Renold Don MD (October 19, 2010 3:30 PM) CC: CPE Is Patient Diabetic? No Pain Assessment Patient in pain? yes     Location: right knee Intensity: 7 Type: aching   Primary Care Provider:  Marisue Ivan  MD  CC:  CPE.  History of Present Illness: 1.  CPE:  Patient here for CPE.  45 yo M with PMH significant for pulmonary sarcoid.  Asking to be "checked out head to toe."  Asking for blood work, including STD checks.  When asked, denies any extramarital sex, but states wants to make sure he didn't pick up something unknown in past.  Hx/o alcohol abuse, denies any IV or other drug use.  See PE and CPOE  2.  Knee pain:  Present for past 3 weeks.  Increasing in intensity.  Describes as chronic aching pain along medial aspect of RIGHT knee, aches "like a toothache."  Intermittent sharp stabbing pain at same area during increased activity.  Will notice pain after roughly 1 flight of stairs.  Takes 600-800 mg Ibuprofen two to three times a day with inconsistent relief.  Works as Veterinary surgeon for [employer], not extremely active but does walk stairs everyday at work. Of note, patient has sarcoidosis and has been on chronic Prednisone 20 mg for past several years, though he hasn't taken this for past several months.   No swelling in joint, no erythema.  No fevers or chills.  Wants to be checked out to make sure nothing is wrong with his knee.  No trauma to knee or leg  Habits & Providers  Alcohol-Tobacco-Diet     Tobacco Status: quit > 6 months  Current Problems (verified): 1)  Alcohol Abuse  (ICD-305.00) 2)  Allergic Rhinitis  (ICD-477.9) 3)  Health Maintenance Exam  (ICD-V70.0) 4)  Asthma, Persistent  (ICD-493.90) 5)  Special  Screening Malignant Neoplasm of Prostate  (ICD-V76.44) 6)  Screening Other&unspec Cardiovascular Conditions  (ICD-V81.2) 7)  Insomnia  (ICD-780.52) 8)  Sarcoidosis  (ICD-135)  Current Medications (verified): 1)  Dulera 200-5 Mcg/act Aero (Mometasone Furo-Formoterol Fum) .... 2 Puffs First Thing  in Am and 2 Puffs Again in Pm About 12 Hours Later 2)  Prednisone 10 Mg  Tabs (Prednisone) .... 2 Daily Until Better Then Taper To One Half Daily Once Better 3)  Proair Hfa 108 (90 Base) Mcg/act  Aers (Albuterol Sulfate) .Marland Kitchen.. 1-2 Puffs Every 4-6 Hours As Needed 4)  Hydrocodone-Acetaminophen 5-500 Mg Tabs (Hydrocodone-Acetaminophen) .Marland Kitchen.. 1 By Mouth Q 4-6 Hours As Needed Pain 5)  Penicillin V Potassium 500 Mg Tabs (Penicillin V Potassium) .Marland Kitchen.. 1 By Mouth Three Times A Day X 10 Days Take 1 Hour Before Meals 6)  Tramadol Hcl 50 Mg Tabs (Tramadol Hcl) .... Take 1-2 Tabs Every 6 Hours As Needed For Pain  Allergies (verified): No Known Drug Allergies  Past History:  Past medical, surgical, family and social histories (including risk factors) reviewed, and no changes noted (except as noted below).  Past Medical History: Reviewed history from 03/04/2010 and no changes required. SARCOIDOSIS (ICD-135).........................................................Marland KitchenWert    - FOB with lung biopsy 2005 positive NCG> variably on  prednisone since then Sickle Cell Trait Asthma   - MDI 50% March 04, 2010   Past Surgical History: Reviewed history from 02/01/2008 and no changes required. s/p Shoulder surgery 2007 for Rotator cuff injury - Dr. Rayburn Ma (released)  Family History: Reviewed history from 02/01/2008 and no changes required. Pos asthma, mother Mother - Sickle Cell Disease, Asthma, CAD s/p MI (prior to 43 yo), s/p CVA (frequent hospitalizations) Father - passed in 72 (36 yo) from Prostate Cancer  4 siblings Sister - multiple medical problems - details unk Brother - ? Cancer Brother - died 2/2 drug  abuse  Social History: Reviewed history from 03/04/2010 and no changes required. Married.  Lives with wife [name] and kids.  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  Quit smoking 1995.  Drinks 1 bottle Brandy every other day - decreased to only 1-2 drinks per week 2011.  No drugs or hx of IVDU.  Does lots of walking with job, but no regular exercise.  Review of Systems       ROS:  no headaches, pre-syncopal or syncopal episodes, chest pain, palpitations, shortness of breath or dyspnea, abdominal pain, diarrhea or constipation, melena, hematochezia, lower extremity swelling.  Denies dysuria, penile discharge, easy bruising   Physical Exam  General:  Vital signs reviewed. Well-developed, well-nourished patient in NAD.  Awake and cooperative  Head:  normocephalic and atraumatic.   Eyes:  PERRL, EOMI.  Conjunctiva white and non-icteric Ears:  canals clear, TMS clear Mouth:  Oral mucosa pink and moist Dentition:  fair, missing several teeth Neck:  supple without adenopathy Lungs:  clear to auscultation bilaterally without wheezing, rales, or rhonchi.  Normal work of breathing  Heart:  Regular rate and rhythm without murmur, rub, or gallop.  Normal S1/S2  Abdomen:  soft/nondistended/nontender.  Bowel sounds present and normoactive.  No organomegaly noted.   Msk:  Left knee wnl and without pain Right knee:  no erythema or edema noted.  Full active and passive range of motion.  Some mod tenderness over medial joint line.  Ant/pos drawer negative.  No lateral or medial laxity noted.   Pulses:  distal pulses +2 BL LEs Extremities:  No clubbing, cyanosis, edema, or deformity noted with normal full range of motion of all joints.    Neurologic:  No focal deficits.  CN II-XII intact.  No gait abnormalities, cerebellum testing WNL Skin:  no rashes or lesions noted Psych:  interactive, not depressed or anxious appearing    Impression &  Recommendations:  Problem # 1:  HEALTH MAINTENANCE EXAM (ICD-V70.0) Assessment Comment Only Patient doing well except knee pain, see below.  Low risk for hepatitis, history of unprotected sex when younger, denies sex with men, no IV drug use, no distant drug use in past.  Will obtain screening RPR and HIV today with patient's consent obtained.  BMET and CBC as well.  To fu in 1 year.  No red flags from history.   Orders: Comp Met-[facility] [id number]) CBC-[facility] [id number]) [facility] - Est  40-64 yrs ([address])  Problem # 2:  KNEE PAIN (QMV-784.69) Assessment: New  Acute issue.  Discussed trial of steroid injection today versus referral to sports medicine clinic for more thorough evaluation.  Patient decided on referral, will defer injection today.  Likely some element of osteoarthritis due to prolonged steroid use.  I did not appreciate any ligament laxity or red flags on exam today, appreciate input from Sports Med clinic.  Pt to make appt on his way out today.  Tramadol for pain relief.     The following medications were removed from the medication list:    Hydrocodone-acetaminophen 5-500 Mg Tabs (Hydrocodone-acetaminophen) .Marland Kitchen... 1 by mouth q 4-6 hours as needed pain His updated medication list for this problem includes:    Tramadol Hcl 50 Mg Tabs (Tramadol hcl) .Marland Kitchen... Take 1-2 tabs every 6 hours as needed for pain  Orders: [facility] - Est  40-64 yrs ([address])  Problem # 3:  ALCOHOL ABUSE (ICD-305.00) Assessment: Improved  Orders: HIV-[facility] ([id number]) RPR-[facility] ([id number])  Problem # 4:  ASTHMA, PERSISTENT (ICD-493.90) Refilled both Dulera and Proair.  Encouraged patient to see Dr. Sherene Sires again for sarcoid.  He has been told in past he needs to continue chronic Prednisone use, encouraged to continue as this is Pulmonology recommendation.   His updated medication list for this problem includes:    Dulera 200-5 Mcg/act Aero (Mometasone furo-formoterol fum) .Marland Kitchen... 2 puffs first thing  in am and 2 puffs again in  pm about 12 hours later    Prednisone 10 Mg Tabs (Prednisone) .Marland Kitchen... 2 daily until better then taper to one half daily once better    Proair Hfa 108 (90 Base) Mcg/act Aers (Albuterol sulfate) .Marland Kitchen... 1-2 puffs every 4-6 hours as needed  Complete Medication List: 1)  Dulera 200-5 Mcg/act Aero (Mometasone furo-formoterol fum) .... 2 puffs first thing  in am and 2 puffs again in pm about 12 hours later 2)  Prednisone 10 Mg Tabs (Prednisone) .... 2 daily until better then taper to one half daily once better 3)  Proair Hfa 108 (90 Base) Mcg/act Aers (Albuterol sulfate) .Marland Kitchen.. 1-2 puffs every 4-6 hours as needed 4)  Tramadol Hcl 50 Mg Tabs (Tramadol hcl) .... Take 1-2 tabs every 6 hours as needed for pain  Patient Instructions: 1)  We'll check your blood work today.   2)  We'll send in the Ultram and Dulera to [facility]. 3)  Make an appt to be seen at Sports Medicine hopefully this week.   4)  Come back and see me in 6 months.   Prescriptions: TRAMADOL HCL 50 MG TABS (TRAMADOL HCL) Take 1-2 tabs every 6 hours as needed for pain  #45 x 0   Entered and Authorized by:   Renold Don MD   Signed by:   Renold Don MD on 10/19/2010   Method used:   Electronically to        [facility] [phone]* (retail)       [address]       [place], [place]  [address]       Ph: [phone]       Fax: [phone]   RxID:   [id number] DULERA 200-5 MCG/ACT AERO (MOMETASONE FURO-FORMOTEROL FUM) 2 puffs first thing  in am and 2 puffs again in pm about 12 hours later  #1 x 0   Entered and Authorized by:   Renold Don MD   Signed by:   Renold Don MD on 10/19/2010   Method used:   Electronically to        [facility] [address] [phone]* (retail)       [address]       [place], [place]  [address]       Ph: [phone]       Fax: [phone]   RxID:   [id number]    Orders Added: 1)  Comp Met-[facility] [[id number]] 2)  CBC-[facility] [  [address]] 3)  HIV-[facility] [[id number]] 4)  RPR-[facility] [[id number]] 5)  [facility] - Est   40-64 yrs [[address]]

## 2010-11-05 NOTE — Letter (Signed)
Summary: Generic Letter  [facility]  [address]   [place], [place] [address]   Phone: [phone]  Fax: [phone]    10/21/2010  [name] [address] [place], [place]  [address]  Dear [name],  Your lab results have returned.  We did a blood screening as we discussed.  I will include a copy of your labs below, but in short everything returned completely normal.    Sodium                    138 mEq/L                   135-145   Potassium                 4.3 mEq/L                   3.5-5.3   Chloride                  101 mEq/L                   96-112   CO2                       29 mEq/L                    19-32   Glucose                   84 mg/dL                    77-82   BUN                       15 mg/dL                    4-23   Creatinine                1.16 mg/dL                  0.40-1.50   Bilirubin, Total          0.5 mg/dL                   5.3-6.1   Alkaline Phosphatase      57 U/L                      39-117   AST/SGOT                  19 U/L                      0-37   ALT/SGPT                  13 U/L                      0-53   Total Protein             7.9 g/dL                    4.4-3.1   Albumin                   4.9 g/dL  3.5-5.2   Calcium                   9.6 mg/dL                   1.6-10.9  Tests: (2) CBC NO Diff (Complete Blood Count) (10000)   WBC                       5.1 K/uL                    4.0-10.5   RBC                       5.49 MIL/uL                 4.22-5.81   Hemoglobin                15.9 g/dL                   60.4-54.0   Hematocrit                44.0 %                      39.0-52.0   MCV                       80.1 fL                     78.0-100.0   MCH                       29.0 pg                     26.0-34.0   MCHC                      36.1 g/dL                   98.1-19.1   RDW                       14.1 %                      11.5-15.5   Platelet Count            216 K/uL                     150-400    HIV Antibody              NON REACTIVE                NON REACTIVE   RPR                       NON REAC                    NON REAC   It was good to meet you yesterday and I apologize for the long wait.  I hope your knee is feeling better.  I would also recommend again that you follow up with Dr. Sherene Sires regarding your sarcoidosis.    Good to see you and let me know if you have any questions.    Sincerely,  Renold Don MD   Appended Document: Generic Letter mailed

## 2010-11-13 ENCOUNTER — Encounter: Payer: Self-pay | Admitting: *Deleted

## 2010-12-02 ENCOUNTER — Telehealth: Payer: Self-pay | Admitting: Family Medicine

## 2010-12-02 NOTE — Telephone Encounter (Signed)
Would like to know what vitamins or something he can take for his tiredness.  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  Has a hard time resting also.

## 2010-12-02 NOTE — Telephone Encounter (Signed)
Spoke with patient and told him that Dr. Gwendolyn Grant would like for him to come in to be evaluated to ensure that there are no other health concerns.  Patient understood and agreed. He will call back to schedule an appt.Loralee Pacas [place]

## 2010-12-02 NOTE — Telephone Encounter (Signed)
If he is experiencing this much fatigue he should be seen to make sure its not something else going on.

## 2011-02-19 NOTE — Assessment & Plan Note (Signed)
[facility]                               PULMONARY OFFICE NOTE   [name], [name]                       MRN:          [id number]  DATE:06/02/2006                            DOB:          [birth year]    REFERRING PHYSICIAN:  Dr. Simeon Craft   REASON FOR CONSULTATION:  Preoperative evaluation.   HISTORY:  This is a very complicated 45 year old black male diagnosed with  sarcoid in 2005 by transbronchial biopsy, with recurrent cough that appeared  to be still responsive and was supposed to return for followup every 4 weeks  after his last visit on August 16, 2005, but never returned.  He comes  back now, having been turned down for surgery for active wheezing that has  been present for weeks, for which he has been self-medicating with  albuterol.  He was planning to undergo surgery on the right shoulder for  cuff repair.   The patient denies any ocular or articular complaints, but does complain of  dyspnea with anything more than slow ADLs, with frequent nocturnal awakening  as well with cough and subjective wheezing, with dyspnea with minimal  activity.   The patient presently is on short-term disability because of inability to  lift his arms above his head at work.   PAST MEDICAL HISTORY:  Pulmonary sarcoidosis, documented April 2005, with  previous response to prednisone also documented.   ALLERGIES:  None known.   MEDICATIONS:  Albuterol p.r.n.   SOCIAL HISTORY:  He has smoked socially.   FAMILY HISTORY:  Positive for sickle cell in his mother, who also had  asthma.  No family history of any sarcoid to his knowledge.   REVIEW OF SYSTEMS:  Taken in detail and essentially negative, except for the  respiratory complaints and the right shoulder pain, with no fevers, chills,  sweats, or weight loss.   PHYSICAL EXAMINATION:  GENERAL:  This is a pleasant, ambulatory black male  in no acute distress.  VITAL SIGNS:  Normal, with no  increased work of breathing at rest.  HEENT:  Unremarkable. Nasal turbinates are normal.  Oropharynx is clear.  NECK:  Supple, without cervical adenopathy or tenderness.  Trachea is  midline.  LUNG FIELDS:  Revealed panexpiratory wheezing bilaterally.  HEART:  Regular rate and rhythm, without murmur, gallop, or rub.  ABDOMEN:  Soft benign.  EXTREMITIES:  Warm, without calf tenderness, cyanosis, clubbing, or edema.   Chest x-ray shows prominent adenopathy, but no definite interstitial  disease.  There is mild to moderate hyperinflation.   IMPRESSION:  Poorly controlled asthma is most likely the result of airway  involvement with sarcoid, and previously apparently responded nicely to  prednisone.  In his present exacerbation, I do not believe we can clear him  for surgery.  I would recommend at this point restarting prednisone at 20  mg per day until better, then 10 mg per day until he returns.  In addition,  I recommend Zegerid 40 mg at bedtime in case there is any nocturnal wheeze.   I spent a good deal of time teaching this patient  how to use also rescue  inhaler in the form of Xopenex HFA 2 puffs q.4 h. to reduce the risk of  tachyphylaxis from racemic albuterol, and documented that he could be  improved from approximately 25% effectiveness up to 75% effectiveness.   I do not believe this patient will be able to work until he is both  breathing better and has had a chance to recuperate from his shoulder  surgery, which now will need to be put off several weeks until there has  been time to reduce his perioperative risk for asthma exacerbation.                                   Charlaine Dalton. Sherene Sires, MD, [facility]   MBW/MedQ  DD:  06/02/2006  DT:  06/03/2006  Job #:  [id number]   cc:   Vanita Panda. Magnus Ivan, MD

## 2011-02-19 NOTE — Assessment & Plan Note (Signed)
[facility]                               PULMONARY OFFICE NOTE   [name], [name]                       MRN:          [id number]  DATE:06/23/2006                            DOB:          [birth year]    HISTORY:  This a 45 year old black male with sarcoidosis documented in  August of 2005 and now back on Prednisone consistently since June 02, 2006  for symptoms of dyspnea with subjective wheezing (note that he had severe  cobblestoning at the time of initial evaluation). He states he is 100%  better with no significant dyspnea, cough, chest pain, fever, chills,  sweats, orthopnea, PND or leg swelling. His present regiment consisting of  Prednisone which he has tapered now down to 10 mg per day plus continuing on  Zegerid at bedtime with minimal need for Xopenex.   PHYSICAL EXAMINATION:  GENERAL: He is a pleasant ventilatory black man in no  acute distress.  VITAL SIGNS: He is afebrile with normal vital signs.  HEENT: Unremarkable.  LUNGS: Fields perfectly clear bilaterally.  HEART:  Regular rate and rhythm without murmurs.  ABDOMEN: Soft.  EXTREMITIES:  Warm without calf tenderness.   IMPRESSION:  Complete resolution of all sarcoid symptoms which consist  mostly of subjective wheezing with objective evidence of airflow obstruction  documented a year ago with a significant asthmatic component.   Therefore, it may be well that as he tapers the Prednisone he will have  flare ups.  I want him to stay on the same dose of Prednisone through  surgery and then post-op, I want to see him so we can introduce to the use  of Symbicort or Advair for maintenance purposes (if he will return to the  clinic, since adherence has been a major issue with this patient). I  discussed all these openly and directly with him in detail and will advise  Dr. Simeon Craft that he is cleared for surgery, but will be need perioperative  steroids and may need p.r.n.  nebulizer treatments with albuterol 2.5 q.4  p.r.n.                                   Charlaine Dalton. Sherene Sires, MD, [facility]   MBW/MedQ  DD:  06/24/2006  DT:  06/26/2006  Job #:  [id number]

## 2011-02-19 NOTE — Op Note (Signed)
[name], [name]              ACCOUNT NO.:  [id number]   MEDICAL RECORD NO.:  [id number]          PATIENT TYPE:  AMB   LOCATION:  SDS                          FACILITY:  [facility]   PHYSICIAN:  Vanita Panda. Magnus Ivan, M.D.DATE OF BIRTH:  November 09, 1965   DATE OF PROCEDURE:  07/15/2006  DATE OF DISCHARGE:                                 OPERATIVE REPORT   PREOPERATIVE DIAGNOSES:  Right shoulder impingement and labral tear.   POSTOPERATIVE DIAGNOSES:  Right shoulder impingement and labral tear.   PROCEDURES:  1. Right shoulder arthroscopy with extensive debridement in the      glenohumeral joint and debridement of labral tear.  2. Right shoulder subacromial decompression.   SURGEON:  Vanita Panda. Magnus Ivan, M.D.   ANESTHESIA:  1. Right shoulder regional interscalene block.  2. General anesthesia.   ANTIBIOTICS:  Ancef 1 g IV.   BLOOD LOSS:  Minimal.   COMPLICATIONS:  None.   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  He has  had a history of right shoulder pain and he attributes this to his work and  a possible work-related injury.  He has had difficulty with mainly over-head  activities and pain in the axillary region.  I did obtain an MRI, which was  suggestive of anterior inferior labral tearing.  I recommended he undergo  arthroscopy with the potential for a labral tear versus debridement.  He  never had symptoms of subluxation of the shoulder or instability of the  shoulder.   PROCEDURE DESCRIPTION:  After informed consent was obtained and the right  arm was marked, an interscalene block was obtained by Anesthesia, and he was  brought to the operating room and placed supine on the operating table.  General anesthesia was then obtained and he was placed into a lateral  decubitus position and a beanbag was used with appropriate padding.  His  left shoulder was down and  his right shoulder was up.  He was then placed in  in-line longitudinal traction using the fishing-pole traction with 10 pounds  of traction.  The arm was prepped and draped with DuraPrep and sterile  drapes.  I first made a posterior incision just off the posterolateral edge  of the acromion 2 fingerbreadths distal and one fingerbreadth medial, and I  was able to enter the glenohumeral joint.  Right away, I could see that  there was anterior labral tearing down to the inferior aspect of the  shoulder, but the glenohumeral joint itself was pristine in terms of no  cartilage irregularities whatsoever.  The humeral head seemed to sit nicely  in the socket.  I next made an anterior portal just lateral to the coracoid  process in the interval between the biceps tendon and the subscapularis.  Both the biceps and subscapularis were found to be intact to probing.  There  was extensive labral tearing, but, again, significant fraying of the tissue  as well.  I inserted a soft tissue ablation wand and performed a debridement  extensively in the glenohumeral joint.  I then placed a rasp along the  anterior edge of the glenoid and was able to rasp the bone to get to a  bleeding surface for the labrum to scar down.  I chose not to pursue any  suture anchoring because what I did find, I felt, could star down because  the shoulder was not unstable.  I did notice undersurface tearing of the  rotator cuff and I debrided this as well.  I then next went to the  subacromial space through the posterior portal and found abundant bursitis  in this area.  I made a lateral portal just off the lateral edge of the  acromion about 2 cm and was able to use the ablation wand and a shaver to do  a subacromial decompression.  I did not do a distal clavicle resection, and  a minimal acromioplasty.  I did watch the rotator cuff move as a unit,  putting his arm through a range of motion, and there was only mild  impingement  and I did not do a CA ligament release either.  All  instrumentation was then removed, and I closed the portal sites with  interrupted 4-0 nylon suture to all three portal sites.  A well-padded  sterile dressing was applied.  His arm was placed in a sling.  He was  awakened, extubated and taken to the recovery room in stable condition.  There were no complications whatsoever.  Postoperatively, we will start  extensive rehabilitation of his shoulder, once I give it a period of rest.           ______________________________  Vanita Panda. Magnus Ivan, M.D.     CYB/MEDQ  D:  07/15/2006  T:  07/15/2006  Job:  [id number]

## 2011-02-19 NOTE — Op Note (Signed)
NAMECLANCE, BAQUERO                          ACCOUNT NO.:  [id number]   MEDICAL RECORD NO.:  [id number]                   PATIENT TYPE:  AMB   LOCATION:  CARD                                 FACILITY:  [facility]   PHYSICIAN:  Casimiro Needle B. Sherene Sires, M.D. [facility]           DATE OF BIRTH:  [birth year]   DATE OF PROCEDURE:  06/03/2004  DATE OF DISCHARGE:                                 OPERATIVE REPORT   REFERRED BY:  Carleene Cooper, M.D.   PROCEDURE:  Fiberoptic bronchoscopy with transbronchial biopsy to the right  lower lobe.   HISTORY:  A 45 year old black male with a two year history of asthma  previously responsive to Advair and dramatically improved with prednisone  but on chest x-ray has evidence of bulky adenopathy consistent with sarcoid  and therefore is felt to be appropriate for a transbronchial biopsy to the  extent to which sarcoid involves his airways (explaining may of his  symptoms) and/or is an innocent bystander in terms of his symptoms but  might explain his adenopathy.   The patient agreed to the procedure after a full discussion of the risks,  benefits, and alternatives in the office yesterday.  Please see the dictated  office records for further information.   The procedure was performed in the bronchoscopy suite with continuous  monitoring by surface ECG and oximetry.  He received a total of 5 mg IV  Versed and 25 mg of IV Demerol with adequate sedation and cough suppression.   The right naris was initially prepared with 2% lidocaine jelly and 1%  lidocaine by updraft nebulizer.   Using a standard flexible video bronchoscope, the right naris was easily  cannulated with good visualization of the entire oropharynx and larynx. The  cords moved normally and there were no apparent upper airway lesions. Using  an additional 1% lidocaine as needed, the entire tracheobronchial tree was  explored bilaterally with the following findings:   There was diffuse mild to moderate  airway edema with minimal cobblestoning.  There were no focal endobronchial lesions.   DESCRIPTION OF PROCEDURE:  Using the standard wedge position in the right  lower lobe, multiple transbronchial biopsies were obtained with adequate  tissue.  There was minimal bleeding. There was no evidence of pneumothorax  by fluoroscopy but a followup chest x-ray is pending.   IMPRESSION:  Probable sarcoid with only mild airway involvement.   RECOMMENDATIONS:  Because he has such difficult to control asthma  historically and evidence of radiographic and airway involvement with  sarcoid, albeit relatively mild, I am going to start him on prednisone at 20  mg per day and bring him back to the office with the next week pending  tissue confirmation to review tissue results and evaluate his response to  prednisone.  Charlaine Dalton. Sherene Sires, M.D. [facility]    MBW/MEDQ  D:  06/03/2004  T:  06/03/2004  Job:  [id number]

## 2011-05-21 ENCOUNTER — Other Ambulatory Visit: Payer: Self-pay | Admitting: Internal Medicine

## 2011-05-26 ENCOUNTER — Telehealth: Payer: Self-pay | Admitting: Internal Medicine

## 2011-05-26 MED ORDER — ALBUTEROL SULFATE HFA 108 (90 BASE) MCG/ACT IN AERS: 1.0000 | INHALATION_SPRAY | Freq: Four times a day (QID) | RESPIRATORY_TRACT | Status: DC | PRN

## 2011-05-26 NOTE — Telephone Encounter (Signed)
Ok to give prednisone 10 mg #30 2 daily until better then 1 daily until ov No refills

## 2011-05-26 NOTE — Telephone Encounter (Signed)
Spoke with pt. He has sarcoid, last seen in 2010. He is c/o increased SOB x several days, cough, wheezing and states wakes up at night with SOB. He has been out of prednisone and proair x several wks and is requesting refill. I have sched him for appt on Friday- this was the soonest he could come in. Advised seek emergency care sooner if necc. I advised will refill his proair, but need to ask MW about refilling the prednisone. Last ov with directions to take pred 20 mg per day with floor of 5 mg. Please advise, thanks!

## 2011-05-27 ENCOUNTER — Encounter: Payer: Self-pay | Admitting: Internal Medicine

## 2011-05-27 ENCOUNTER — Telehealth: Payer: Self-pay | Admitting: Internal Medicine

## 2011-05-27 MED ORDER — PREDNISONE 10 MG PO TABS: 20.0000 mg | ORAL_TABLET | Freq: Every day | ORAL | Status: DC

## 2011-05-27 NOTE — Telephone Encounter (Signed)
Called to inform pt of MW's recs and he advised he will not be able to afford medication until next week and requesting sample of rescue inhaler due to increased SOB and wheezing. I called [facility] office and asked Aundra Millet R to place one at the front desk for pt. Pt is aware and is coming in the AM to see MW. I also informed pt if he gets worse before his appointment with MW to go to the nearest emergency center.  Medication sent to [facility].

## 2011-05-27 NOTE — Telephone Encounter (Signed)
Previous phone note discussed with pt that rescue inhaler will be left at front desk at [facility] office. Pt scheduled to see MW in the morning and will address same during visit.

## 2011-05-28 ENCOUNTER — Encounter: Payer: Self-pay | Admitting: *Deleted

## 2011-05-28 ENCOUNTER — Encounter: Payer: Self-pay | Admitting: Internal Medicine

## 2011-05-28 ENCOUNTER — Ambulatory Visit (INDEPENDENT_AMBULATORY_CARE_PROVIDER_SITE_OTHER)
Admission: RE | Admit: 2011-05-28 | Discharge: 2011-05-28 | Disposition: A | Discharge status: Home / Self Care | Payer: BC Managed Care – PPO | Source: Ambulatory Visit | Attending: Internal Medicine | Admitting: Internal Medicine

## 2011-05-28 ENCOUNTER — Ambulatory Visit (INDEPENDENT_AMBULATORY_CARE_PROVIDER_SITE_OTHER): Payer: BC Managed Care – PPO | Admitting: Internal Medicine

## 2011-05-28 VITALS — BP 100/80 | HR 87 | Temp 98.5°F | Ht 71.0 in | Wt 142.8 lb

## 2011-05-28 DIAGNOSIS — D869 Sarcoidosis, unspecified: Secondary | ICD-10-CM

## 2011-05-28 DIAGNOSIS — J45909 Unspecified asthma, uncomplicated: Secondary | ICD-10-CM

## 2011-05-28 MED ORDER — METHYLPREDNISOLONE ACETATE 80 MG/ML IJ SUSP
120.0000 mg | Freq: Once | INTRAMUSCULAR | Status: AC
  Administered 2011-05-28: 120 mg via INTRAMUSCULAR

## 2011-05-28 NOTE — Assessment & Plan Note (Addendum)
The goal with a chronic steroid dependent illness is always arriving at the lowest effective dose that controls the disease/symptoms and not accepting a set "formula" which is based on statistics or guidelines that don't always take into account patient  variability or the natural hx of the dz in every individual patient, which may well vary over time.  For now therefore I recommend the patient maintain  20 mg per day then taper to 5 mg per day and only try to stop the pred  If doing well  while maintaining on dulera 200.    I used  the analogy of putting steroid cream on a rash to help explain the meaning of topical therapy and the need to get the drug to the target tissue.    The proper method of use, as well as anticipated side effects, of this metered-dose inhaler are discussed and demonstrated to the patient. Improved to 75% with coaching

## 2011-05-28 NOTE — Progress Notes (Signed)
Subjective:    Patient ID: [name], male    DOB: [birth year], 45 y.o.   MRN: [id number]  HPI  86 yobm former smoker quit around 1995 exp to second hand smoke only dx with sarcoid 8/05 by tbbx on prednisone off and on since then with inconsistent adherence documented.   December 13, 2008--Last seen 3/09- did not return for follow up. Since last viist. Tapered off prednisone, he has taken on/off over last year. Would take when he was dyspneic, wheezing/ and cough. Over last 2 weeks increased cough, tightness, dyspnea, and wheezing. Almost out of prednisone. --CXR hyperinflation of the lungs with enlarged hilar tissue. The hilar tissue has minimally changed. ACE at 55. Prendisone restarted at 20mg  once daily   December 18, 2008-- Returns compalining that breathing no better. still has increased SOB since last OV, wheezing, prod cough with white sputum - worse since last OV. Pt misunderstood instructions, only on prednisone 10mg  once daily. Rec 20 mg per day   January 02, 2009 still on 20 mg per day and finally turning the corner with less sob and cough. rec Prednisone 10 mg 2 each am until 100% better, then 1 daily x 2weeks then one half daily until seen   Feb 07, 2009 ov better sob, much less doe, less SABA use, not using symbicort consistently and so hasn't used up the 80 yet thought should have long before ov.   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 Pt denies any significant sore throat, dysphagia, itching, sneezing, nasal congestion or excess secretions, fever, chills, sweats, unintended wt loss, pleuritic or exertional cp, hempoptysis, change in activity tolerance orthopnea pnd or leg swelling. increasing need for saba x sev weeks, less responsive now and therefore increased prednisone to 20 mg per day x 2 weeks, minimally better.   rec add dulera 200, taper  pred to 5 mg if tolerates, f/u 4 weeks > lost insurance, did not return   05/28/2011 f/u ov/Wert cc much worse since ran out of prednisone x 2 weeks ago had taperd down to 10 mg per day. Thinks dulera helped while on it but also ran out of it over a year ago and had just been relying on the prednisone.  Main problems are minimally prod cough and sob with minimal adls and noct wheeze with R > L ant chest tightness. Called in prednisone, could not afford to fill rx. No nausea, ocular or articular co's or rash.  Pt denies any significant sore throat, dysphagia, itching, sneezing,  nasal congestion or excess/ purulent secretions,  fever, chills, sweats, unintended wt loss, pleuritic or exertional cp, hempoptysis, orthopnea pnd or leg swelling.    Also denies any obvious fluctuation of symptoms with weather or environmental changes or other aggravating or alleviating factors.     Past Medical History:  SARCOIDOSIS (ICD-135).........................................................Marland KitchenWert  - FOB with lung biopsy 2005 positive NCG> variably on prednisone since then  Sickle Cell Trait  Asthma  - MDI 50% March 04, 2010 > 75% 05/28/11  Family History:  Pos asthma, mother  Mother - Sickle Cell Disease, Asthma, CAD s/p MI (prior to 43 yo), s/p CVA (frequent hospitalizations)  Father - passed in 33 (12 yo) from Prostate Cancer  4 siblings  Sister - multiple medical problems - details unk  Brother - ? Cancer  Brother - died 2/2 drug abuse  Social History:  Married. Lives with wife [name] and kids. 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 Quit smoking 1995. Drinks 1 bottle Brandy every other day. No drugs or hx of IVDU. Does lots of walking with job, but no regular exercise.      Review of Systems     Objective:   Physical Exam Ambulatory healthy bm appearing in no acute distress.  wt 142 > 143 Feb 08, 2009 > 141 March 04, 2010 > 05/28/2011  142 HEENT: nl  dentition, turbinates, and orophanx. Nl external ear canals without cough reflex  Neck without JVD/Nodes/TM  Lungs bilateral mid exp wheezes and mod increase exp time  RRR no s3 or murmur or increase in P2  Abd soft and benign with nl excursion in the supine position. No bruits or organomegaly  Ext warm without calf tenderness, cyanosis clubbing or edema  Skin warm and dry without lesions  CXR  05/28/2011 :  Stable mild bilateral hilar lymphadenopathy. .Stable COPD. No acute findings.         Assessment & Plan:

## 2011-05-28 NOTE — Patient Instructions (Addendum)
Dulera 200 Take 2 puffs first thing in am and then another 2 puffs about 12 hours later.   Work on inhaler technique:  relax and gently blow all the way out then take a nice smooth deep breath back in, triggering the inhaler at same time you start breathing in.  Hold for up to 5 seconds if you can.  Rinse and gargle with water when done   If your mouth or throat starts to bother you,   I suggest you time the inhaler to your dental care and after using the inhaler(s) brush teeth and tongue with a baking soda containing toothpaste and when you rinse this out, gargle with it first to see if this helps your mouth and throat.     Prednisone tablets 10mg   2 daily until 100% then 1 daily x 1 week then 1/2 daily x 1 week then try stopping (only going to work if stay on dulera 200)  Please schedule a follow up office visit in 4 weeks, sooner if needed

## 2011-05-29 ENCOUNTER — Encounter: Payer: Self-pay | Admitting: Internal Medicine

## 2011-05-29 NOTE — Assessment & Plan Note (Signed)
Not clear if this copd/ab or sarcoid/ab but treat it as potentially reversible for now

## 2011-06-08 ENCOUNTER — Encounter: Payer: Self-pay | Admitting: *Deleted

## 2011-06-08 NOTE — Progress Notes (Signed)
Quick Note:  Still unable to reach this pt so will mail letter and ask that he call for results. ______

## 2011-06-14 ENCOUNTER — Telehealth: Payer: Self-pay | Admitting: Internal Medicine

## 2011-06-14 NOTE — Telephone Encounter (Signed)
    Call pt: Reviewed cxr and no acute change so no change in recommendations made at ov   I spoke with patient about results and he verbalized understanding and had no questions 

## 2011-06-22 ENCOUNTER — Ambulatory Visit: Payer: BC Managed Care – PPO | Admitting: Internal Medicine

## 2011-07-14 ENCOUNTER — Encounter: Payer: BC Managed Care – PPO | Admitting: Internal Medicine

## 2011-07-14 NOTE — Progress Notes (Signed)
Subjective:    Patient ID: [name], male    DOB: May 15, 1966, 45 y.o.   MRN: [id number]  HPI  46 yobm former smoker quit around 1995 exp to second hand smoke only dx with sarcoid 8/05 by tbbx on prednisone off and on since then with inconsistent adherence documented.   December 13, 2008--Last seen 3/09- did not return for follow up. Since last viist. Tapered off prednisone, he has taken on/off over last year. Would take when he was dyspneic, wheezing/ and cough. Over last 2 weeks increased cough, tightness, dyspnea, and wheezing. Almost out of prednisone. --CXR hyperinflation of the lungs with enlarged hilar tissue. The hilar tissue has minimally changed. ACE at 55. Prendisone restarted at 20mg  once daily   December 18, 2008-- Returns compalining that breathing no better. still has increased SOB since last OV, wheezing, prod cough with white sputum - worse since last OV. Pt misunderstood instructions, only on prednisone 10mg  once daily. Rec 20 mg per day   January 02, 2009 still on 20 mg per day and finally turning the corner with less sob and cough. rec Prednisone 10 mg 2 each am until 100% better, then 1 daily x 2weeks then one half daily until seen   Feb 07, 2009 ov better sob, much less doe, less SABA use, not using symbicort consistently and so hasn't used up the 80 yet thought should have long before ov.   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 Pt denies any significant sore throat, dysphagia, itching, sneezing, nasal congestion or excess secretions, fever, chills, sweats, unintended wt loss, pleuritic or exertional cp, hempoptysis, change in activity tolerance orthopnea pnd or leg swelling. increasing need for saba x sev weeks, less responsive now and therefore increased prednisone to 20 mg per day x 2 weeks, minimally better.   rec add dulera 200, taper  pred to 5 mg if tolerates, f/u 4 weeks > lost insurance, did not return   05/28/2011 f/u ov/[name] cc much worse since ran out of prednisone x 2 weeks ago had taperd down to 10 mg per day. Thinks dulera helped while on it but also ran out of it over a year ago and had just been relying on the prednisone.  Main problems are minimally prod cough and sob with minimal adls and noct wheeze with R > L ant chest tightness. Called in prednisone, could not afford to fill rx. No nausea, ocular or articular co's or rash. rec Dulera 200 Take 2 puffs first thing in am and then another 2 puffs about 12 hours later.   Work on inhaler technique:  relax and gently blow all the way out then take a nice smooth deep breath back in, triggering the inhaler at same time you start breathing in.  Hold for up to 5 seconds if you can.  Rinse and gargle with water when done   If your mouth or throat starts to bother you,   I suggest you time the inhaler to your dental care and after using the inhaler(s) brush teeth and tongue with a baking soda containing toothpaste and when you rinse this out, gargle with it first to see if this helps your mouth and throat.     Prednisone tablets 10mg   2 daily until 100% then 1 daily x 1 week then 1/2 daily x 1 week then try stopping (only  going to work if stay on dulera 200)  07/14/2011 f/u ov/[name] cc  Pt denies any significant sore throat, dysphagia, itching, sneezing,  nasal congestion or excess/ purulent secretions,  fever, chills, sweats, unintended wt loss, pleuritic or exertional cp, hempoptysis, orthopnea pnd or leg swelling.    Also denies any obvious fluctuation of symptoms with weather or environmental changes or other aggravating or alleviating factors.     Past Medical History:  SARCOIDOSIS (ICD-135).........................................................Marland KitchenWert  - FOB with lung biopsy 2005 positive NCG> variably on prednisone since then  Sickle Cell Trait  Asthma  - MDI 50%  March 04, 2010 > 75% 05/28/11  Family History:  Pos asthma, mother  Mother - Sickle Cell Disease, Asthma, CAD s/p MI (prior to 43 yo), s/p CVA (frequent hospitalizations)  Father - passed in 35 (72 yo) from Prostate Cancer  4 siblings  Sister - multiple medical problems - details unk  Brother - ? Cancer  Brother - died 2/2 drug abuse   Social History:  Married. Lives with wife [name] and kids. 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 Quit smoking 1995. Drinks 1 bottle Brandy every other day. No drugs or hx of IVDU. Does lots of walking with job, but no regular exercise.      Review of Systems     Objective:   Physical Exam Ambulatory healthy bm appearing in no acute distress.  wt 142 > 143 Feb 08, 2009 > 141 March 04, 2010 > 05/28/2011  142 > 07/14/2011  HEENT: nl dentition, turbinates, and orophanx. Nl external ear canals without cough reflex  Neck without JVD/Nodes/TM  Lungs bilateral mid exp wheezes and mod increase exp time  RRR no s3 or murmur or increase in P2  Abd soft and benign with nl excursion in the supine position. No bruits or organomegaly  Ext warm without calf tenderness, cyanosis clubbing or edema  Skin warm and dry without lesions  CXR  05/28/2011 :  Stable mild bilateral hilar lymphadenopathy. .Stable COPD. No acute findings.         Assessment & Plan:

## 2011-07-22 ENCOUNTER — Ambulatory Visit: Payer: BC Managed Care – PPO | Admitting: Internal Medicine

## 2011-07-23 ENCOUNTER — Encounter: Payer: Self-pay | Admitting: *Deleted

## 2011-07-23 ENCOUNTER — Encounter: Payer: Self-pay | Admitting: Internal Medicine

## 2011-07-23 ENCOUNTER — Ambulatory Visit (INDEPENDENT_AMBULATORY_CARE_PROVIDER_SITE_OTHER): Payer: BC Managed Care – PPO | Admitting: Internal Medicine

## 2011-07-23 DIAGNOSIS — D869 Sarcoidosis, unspecified: Secondary | ICD-10-CM

## 2011-07-23 DIAGNOSIS — J45909 Unspecified asthma, uncomplicated: Secondary | ICD-10-CM

## 2011-07-23 MED ORDER — MOMETASONE FURO-FORMOTEROL FUM 200-5 MCG/ACT IN AERO: INHALATION_SPRAY | RESPIRATORY_TRACT | Status: DC

## 2011-07-23 MED ORDER — LEVALBUTEROL TARTRATE 45 MCG/ACT IN AERO: INHALATION_SPRAY | RESPIRATORY_TRACT | Status: DC

## 2011-07-23 NOTE — Patient Instructions (Signed)
Remember the dulera Take 2 puffs first thing in am and then another 2 puffs about 12 hours later.   Only use your xopenex sample as a rescue medication to be used if you can't catch your breath by resting or doing a relaxed purse lip breathing pattern. The less you use it, the better it will work when you need it. .    Please schedule a follow up office visit in 6 weeks, call sooner if needed

## 2011-07-23 NOTE — Assessment & Plan Note (Addendum)
DDX of  difficult airways managment all start with A and  include Adherence, Ace Inhibitors, Acid Reflux, Active Sinus Disease, Alpha 1 Antitripsin deficiency, Anxiety masquerading as Airways dz,  ABPA,  allergy(esp in young), Aspiration (esp in elderly), Adverse effects of DPI,  Active smokers, plus two Bs  = Bronchiectasis and Beta blocker use..and one C= CHF  In this case Adherence is the biggest issue and starts with  inability to use HFA effectively and also  understand that SABA treats the symptoms but doesn't get to the underlying problem (inflammation).  I used  the analogy of putting steroid cream on a rash to help explain the meaning of topical therapy and the need to get the drug to the target tissue.   The proper method of use, as well as anticipated side effects, of this metered-dose inhaler are discussed and demonstrated to the patient. Improved to 90% p  Coaching-   See instructions for specific recommendations which were reviewed directly with the patient who was given a copy with highlighter outlining the key components.

## 2011-07-23 NOTE — Progress Notes (Signed)
Subjective:    Patient ID: [name], male    DOB: 05-30-1966, 45 y.o.   MRN: [id number]  HPI  38 yobm former smoker quit around 1995 exp to second hand smoke only dx with sarcoid 8/05 by tbbx on prednisone off and on since then with inconsistent adherence documented.   December 13, 2008--Last seen 3/09- did not return for follow up. Since last viist. Tapered off prednisone, he has taken on/off over last year. Would take when he was dyspneic, wheezing/ and cough. Over last 2 weeks increased cough, tightness, dyspnea, and wheezing. Almost out of prednisone. --CXR hyperinflation of the lungs with enlarged hilar tissue. The hilar tissue has minimally changed. ACE at 57. Prendisone restarted at 20mg  once daily   December 18, 2008-- Returns compalining that breathing no better. still has increased SOB since last OV, wheezing, prod cough with white sputum - worse since last OV. Pt misunderstood instructions, only on prednisone 10mg  once daily. Rec 20 mg per day   January 02, 2009 still on 20 mg per day and finally turning the corner with less sob and cough. rec Prednisone 10 mg 2 each am until 100% better, then 1 daily x 2weeks then one half daily until seen   Feb 07, 2009 ov better sob, much less doe, less SABA use, not using symbicort consistently and so hasn't used up the 80 yet thought should have long before ov.   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 Pt denies any significant sore throat, dysphagia, itching, sneezing, nasal congestion or excess secretions, fever, chills, sweats, unintended wt loss, pleuritic or exertional cp, hempoptysis, change in activity tolerance orthopnea pnd or leg swelling. increasing need for saba x sev weeks, less responsive now and therefore increased prednisone to 20 mg per day x 2 weeks, minimally better.   rec add dulera 200, taper  pred to 5 mg if tolerates, f/u 4 weeks > lost insurance, did not return   05/28/2011 f/u ov/[name] cc much worse since ran out of prednisone x 2 weeks ago had taperd down to 10 mg per day. Thinks dulera helped while on it but also ran out of it over a year ago and had just been relying on the prednisone.  Main problems are minimally prod cough and sob with minimal adls and noct wheeze with R > L ant chest tightness. Called in prednisone, could not afford to fill rx. No nausea, ocular or articular co's or rash. rec Dulera 200 Take 2 puffs first thing in am and then another 2 puffs about 12 hours later.  Work on inhaler technique: .    Prednisone tablets 10mg   2 daily until 100% then 1 daily x 1 week then 1/2 daily x 1 week then try stopping (only going to work if stay on dulera 200)    07/23/2011 f/u ov/[name] cc 100% while on prednisone and dulera 200 but not consistent and worse p finished prednisone x  Days before ov / has also run out of albuterol.  No purulent  sputum, ocular or articular symptoms   Pt denies any significant sore throat, dysphagia, itching, sneezing,  nasal congestion or excess/ purulent secretions,  fever, chills, sweats, unintended wt loss, pleuritic or exertional cp, hempoptysis, orthopnea pnd or leg swelling.    Also denies any obvious fluctuation of symptoms with weather or environmental changes or other aggravating  or alleviating factors.     Past Medical History:  SARCOIDOSIS (ICD-135).........................................................Marland KitchenWert  - FOB with lung biopsy 2005 positive NCG> variably on prednisone since then  Sickle Cell Trait  Asthma  - MDI 50% March 04, 2010 > 75% 05/28/11  Family History:  Pos asthma, mother  Mother - Sickle Cell Disease, Asthma, CAD s/p MI (prior to 43 yo), s/p CVA (frequent hospitalizations)  Father - passed in 25 (56 yo) from Prostate Cancer  4 siblings  Sister - multiple medical problems - details unk  Brother - ? Cancer    Brother - died 2/2 drug abuse   Social History:  Married. Lives with wife [name] and kids. 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 Quit smoking 1995. Drinks 1 bottle Brandy every other day. No drugs or hx of IVDU. Does lots of walking with job, but no regular exercise.      Review of Systems     Objective:   Physical Exam Ambulatory healthy bm appearing in no acute distress.  wt 142 > 143 Feb 08, 2009 > 141 March 04, 2010 > 05/28/2011  142 > 07/14/2011 > 147 07/23/2011  HEENT: nl dentition, turbinates, and orophanx. Nl external ear canals without cough reflex  Neck without JVD/Nodes/TM  Lungs bilateral mid exp wheezes and mod increase exp time  RRR no s3 or murmur or increase in P2  Abd soft and benign with nl excursion in the supine position. No bruits or organomegaly  Ext warm without calf tenderness, cyanosis clubbing or edema  Skin warm and dry without lesions  CXR  05/28/2011 :  Stable mild bilateral hilar lymphadenopathy. .Stable COPD. No acute findings.         Assessment & Plan:

## 2011-07-25 ENCOUNTER — Encounter: Payer: Self-pay | Admitting: Internal Medicine

## 2011-09-03 ENCOUNTER — Encounter: Payer: BC Managed Care – PPO | Admitting: Internal Medicine

## 2011-09-03 NOTE — Progress Notes (Signed)
Subjective:    Patient ID: [name], male    DOB: 1966-04-28, 45 y.o.   MRN: [id number]  HPI  45 yobm former smoker quit around 1995 exp to second hand smoke only dx with sarcoid 8/05 by tbbx on prednisone off and on since then with inconsistent adherence documented.   December 13, 2008--Last seen 3/09- did not return for follow up. Since last viist. Tapered off prednisone, he has taken on/off over last year. Would take when he was dyspneic, wheezing/ and cough. Over last 2 weeks increased cough, tightness, dyspnea, and wheezing. Almost out of prednisone. --CXR hyperinflation of the lungs with enlarged hilar tissue. The hilar tissue has minimally changed. ACE at 57. Prendisone restarted at 20mg  once daily   December 18, 2008-- Returns compalining that breathing no better. still has increased SOB since last OV, wheezing, prod cough with white sputum - worse since last OV. Pt misunderstood instructions, only on prednisone 10mg  once daily. Rec 20 mg per day   January 02, 2009 still on 20 mg per day and finally turning the corner with less sob and cough. rec Prednisone 10 mg 2 each am until 100% better, then 1 daily x 2weeks then one half daily until seen   Feb 07, 2009 ov better sob, much less doe, less SABA use, not using symbicort consistently and so hasn't used up the 80 yet thought should have long before ov.   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 Pt denies any significant sore throat, dysphagia, itching, sneezing, nasal congestion or excess secretions, fever, chills, sweats, unintended wt loss, pleuritic or exertional cp, hempoptysis, change in activity tolerance orthopnea pnd or leg swelling. increasing need for saba x sev weeks, less responsive now and therefore increased prednisone to 20 mg per day x 2 weeks, minimally better.   rec add dulera 200, taper  pred to 5 mg if tolerates, f/u 4 weeks > lost insurance, did not return   05/28/2011 f/u ov/Waylen Depaolo cc much worse since ran out of prednisone x 2 weeks ago had taperd down to 10 mg per day. Thinks dulera helped while on it but also ran out of it over a year ago and had just been relying on the prednisone.  Main problems are minimally prod cough and sob with minimal adls and noct wheeze with R > L ant chest tightness. Called in prednisone, could not afford to fill rx. No nausea, ocular or articular co's or rash. rec Dulera 200 Take 2 puffs first thing in am and then another 2 puffs about 12 hours later.  Work on inhaler technique: .    Prednisone tablets 10mg   2 daily until 100% then 1 daily x 1 week then 1/2 daily x 1 week then try stopping (only going to work if stay on dulera 200)    07/23/2011 f/u ov/Tameka Hoiland cc 100% while on prednisone and dulera 200 but not consistent and worse p finished prednisone x  Days before ov / has also run out of albuterol.  No purulent  sputum, ocular or articular symptoms rec Remember the dulera Take 2 puffs first thing in am and then another 2 puffs about 12 hours later.   Only use your xopenex sample as a rescue medication    09/03/2011 f/u ov/Jeda Pardue cc  Pt denies any significant sore throat, dysphagia, itching, sneezing,  nasal congestion or excess/  purulent secretions,  fever, chills, sweats, unintended wt loss, pleuritic or exertional cp, hempoptysis, orthopnea pnd or leg swelling.    Also denies any obvious fluctuation of symptoms with weather or environmental changes or other aggravating or alleviating factors.     Past Medical History:  SARCOIDOSIS (ICD-135).........................................................Marland KitchenWert  - FOB with lung biopsy 2005 positive NCG> variably on prednisone since then  Sickle Cell Trait  Asthma  - MDI 50% March 04, 2010 > 75% 05/28/11  Family History:  Pos asthma, mother  Mother - Sickle Cell Disease, Asthma, CAD s/p MI (prior to  43 yo), s/p CVA (frequent hospitalizations)  Father - passed in 5 (65 yo) from Prostate Cancer  4 siblings  Sister - multiple medical problems - details unk  Brother - ? Cancer  Brother - died 2/2 drug abuse   Social History:  Married. Lives with wife [name] and kids. 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 Quit smoking 1995. Drinks 1 bottle Brandy every other day. No drugs or hx of IVDU. Does lots of walking with job, but no regular exercise.           Objective:   Physical Exam Ambulatory healthy bm appearing in no acute distress.  wt 142 > 143 Feb 08, 2009 > 141 March 04, 2010  > 147 07/23/2011 > 09/03/2011  HEENT: nl dentition, turbinates, and orophanx. Nl external ear canals without cough reflex  Neck without JVD/Nodes/TM  Lungs bilateral mid exp wheezes and mod increase exp time  RRR no s3 or murmur or increase in P2  Abd soft and benign with nl excursion in the supine position. No bruits or organomegaly  Ext warm without calf tenderness, cyanosis clubbing or edema  Skin warm and dry without lesions  CXR  05/28/2011 :  Stable mild bilateral hilar lymphadenopathy. .Stable COPD. No acute findings.         Assessment & Plan:

## 2011-09-21 ENCOUNTER — Ambulatory Visit: Payer: BC Managed Care – PPO | Admitting: Internal Medicine

## 2011-09-27 ENCOUNTER — Ambulatory Visit: Payer: BC Managed Care – PPO | Admitting: Internal Medicine

## 2011-10-01 ENCOUNTER — Encounter: Payer: Self-pay | Admitting: Internal Medicine

## 2011-10-01 ENCOUNTER — Ambulatory Visit (INDEPENDENT_AMBULATORY_CARE_PROVIDER_SITE_OTHER): Payer: BC Managed Care – PPO | Admitting: Internal Medicine

## 2011-10-01 ENCOUNTER — Encounter: Payer: Self-pay | Admitting: *Deleted

## 2011-10-01 VITALS — BP 120/90 | HR 93 | Temp 98.3°F | Ht 69.0 in | Wt 150.6 lb

## 2011-10-01 DIAGNOSIS — D869 Sarcoidosis, unspecified: Secondary | ICD-10-CM

## 2011-10-01 DIAGNOSIS — R209 Unspecified disturbances of skin sensation: Secondary | ICD-10-CM

## 2011-10-01 DIAGNOSIS — J45909 Unspecified asthma, uncomplicated: Secondary | ICD-10-CM

## 2011-10-01 DIAGNOSIS — R200 Anesthesia of skin: Secondary | ICD-10-CM

## 2011-10-01 MED ORDER — PREDNISONE (PAK) 10 MG PO TABS: ORAL_TABLET | ORAL | Status: AC

## 2011-10-01 NOTE — Patient Instructions (Addendum)
Remember the dulera Take 2 puffs first thing in am and then another 2 puffs about 12 hours later.   Prednisone 10 mg take  4 each am x 2 days,   2 each am x 2 days,  1 each am x2days and stop   Only use your xopenex sample as a rescue medication to be used if you can't catch your breath by resting or doing a relaxed purse lip breathing pattern. The less you use it, the better it will work when you need it. .    Please schedule a follow up visit in 3 months but call sooner if needed with PFTs

## 2011-10-01 NOTE — Progress Notes (Signed)
Subjective:    Patient ID: [name], male    DOB: Sep 27, 1966     MRN: [id number]  HPI  45 yobm former smoker quit around 1995,  dx with sarcoid 05/2004  by tbbx on prednisone off and on since then with inconsistent adherence documented.   December 13, 2008--Last seen 3/09- did not return for follow up. Since last viist. Tapered off prednisone, he has taken on/off x one year. Would take when he was dyspneic, wheezing/ and cough. Over last 2 weeks increased cough, tightness, dyspnea, and wheezing. Almost out of prednisone. --CXR hyperinflation of the lungs with enlarged hilar tissue  Minimally increased.  ACE at 45. Predisone restarted at 20mg  once daily.  December 18, 2008-- Returns compalining that breathing no better. still has increased SOB since last OV, wheezing, prod cough with white sputum - worse since last OV. Pt misunderstood instructions, only on prednisone 10mg  once daily. Rec 20 mg per day   January 02, 2009 still on 20 mg per day and finally turning the corner with less sob and cough. 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 Pt denies any significant sore throat, dysphagia, itching, sneezing, nasal congestion or excess secretions, fever, chills, sweats, unintended wt loss, pleuritic or exertional cp, hempoptysis, change in activity tolerance orthopnea pnd or leg swelling. increasing need for saba x sev weeks, less responsive now and therefore increased prednisone to 20 mg per day x 2 weeks, minimally better.   rec add dulera 200, taper pred to 5 mg if tolerates, f/u 4 weeks > lost insurance, did not return   05/28/2011 f/u ov/Wert cc much worse since ran out of prednisone x 2 weeks ago had taperd down to 10 mg per day. Thinks dulera helped while  on it but also ran out of it over a year ago and had just been relying on the prednisone.  Main problems are minimally prod cough and sob with minimal adls and noct wheeze with R > L ant chest tightness. Called in prednisone, could not afford to fill rx. No nausea, ocular or articular co's or rash. rec Dulera 200 Take 2 puffs first thing in am and then another 2 puffs about 12 hours later.  Work on inhaler technique: .    Prednisone tablets 10mg   2 daily until 100% then 1 daily x 1 week then 1/2 daily x 1 week then try stopping (only going to work if stay on dulera 200)    07/23/2011 f/u ov/Wert cc 100% while on prednisone and dulera 200 but not consistent and worse p finished prednisone x  Days before ov / has also run out of albuterol.  No purulent  sputum, ocular or articular symptoms rec Remember the dulera Take 2 puffs first thing in am and then another 2 puffs about 12 hours later.  Only use your xopenex sample as a rescue medication    10/01/2011 ov/Wert off prednisone since Oct 2012 again ran out of both inhlaers before ov> worse sob since, minimal cough.   Main cc is indolent onset x sev weeks  of  worsening R shoulder pain radiating down to his forearm assoc with tingling and numbness in all 5 fingers. No neck pain, no fever. Has f/u with  his othopedic doctor already scheduled.  Denies trauma.  Pt denies any significant sore throat, dysphagia, itching, sneezing,  nasal congestion or excess/ purulent secretions,  fever, chills, sweats, unintended wt loss, pleuritic or exertional cp, hempoptysis, orthopnea pnd or leg swelling.    Also denies any obvious fluctuation of symptoms with weather or environmental changes or other aggravating or alleviating factors.     Past Medical History:  SARCOIDOSIS (ICD-135).........................................................Marland KitchenWert  - FOB with lung biopsy 2005 positive NCG> variably on prednisone since then  Sickle Cell Trait  Asthma  - MDI 50%  March 04, 2010 > 75% 05/28/11  Family History:  Pos asthma, mother  Mother - Sickle Cell Disease, Asthma, CAD s/p MI (prior to 43 yo), s/p CVA (frequent hospitalizations)  Father - passed in 22 (33 yo) from Prostate Cancer  4 siblings  Sister - multiple medical problems - details unk  Brother - ? Cancer  Brother - died 2/2 drug abuse   Social History:  Married. Lives with wife [name] and kids. 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 Quit smoking 1995. Drinks 1 bottle Brandy every other day. No drugs or hx of IVDU. Does lots of walking with job, but no regular exercise.           Objective:   Physical Exam Ambulatory healthy bm appearing in no acute distress.  wt 142 > 143 Feb 08, 2009 > 141 March 04, 2010  > 147 07/23/2011 > 150 10/01/2011  HEENT: nl dentition, turbinates, and orophanx. Nl external ear canals without cough reflex  Neck without JVD/Nodes/TM  Lungs bilateral mid exp wheezes and mod increase exp time  RRR no s3 or murmur or increase in P2  Abd soft and benign with nl excursion in the supine position. No bruits or organomegaly  Ext warm without calf tenderness, cyanosis clubbing or edema  Skin warm and dry without lesions R hand nl pulses, pain on clenching fist but good motor fxn   CXR  05/28/2011 :  Stable mild bilateral hilar lymphadenopathy.  Stable COPD. No acute findings.         Assessment & Plan:

## 2011-10-02 DIAGNOSIS — G5601 Carpal tunnel syndrome, right upper limb: Secondary | ICD-10-CM | POA: Insufficient documentation

## 2011-10-02 NOTE — Assessment & Plan Note (Signed)
DDX of  difficult airways managment all start with A and  include Adherence, Ace Inhibitors, Acid Reflux, Active Sinus Disease, Alpha 1 Antitripsin deficiency, Anxiety masquerading as Airways dz,  ABPA,  allergy(esp in young), Aspiration (esp in elderly), Adverse effects of DPI,  Active smokers, plus two Bs  = Bronchiectasis and Beta blocker use..and one C= CHF  Adherence is always the initial "prime suspect" and is a multilayered concern that requires a "trust but verify" approach in every patient - starting with knowing how to use medications, especially inhalers, correctly, keeping up with refills and understanding the fundamental difference between maintenance and prns vs those medications only taken for a very short course and then stopped and not refilled. The proper method of use, as well as anticipated side effects, of this metered-dose inhaler are discussed and demonstrated to the patient. Confirmed 90% effective.  See instructions for specific recommendations which were reviewed directly with the patient who was given a copy with highlighter outlining the key components.

## 2011-10-02 NOTE — Assessment & Plan Note (Signed)
The goal with a chronic steroid dependent illness is always arriving at the lowest effective dose that controls the disease/symptoms and not accepting a set "formula" which is based on statistics or guidelines that don't always take into account patient  variability or the natural hx of the dz in every individual patient, which may well vary over time.  For now therefore I recommend the patient maintain  A floor of 0 with only very short course for airways today.  Strongly doubt the numbness in his hand represents sarcoid and needs evaluation asap as planned

## 2011-10-02 NOTE — Assessment & Plan Note (Signed)
No evidence of radicular features or compartment syndrome, referrred back to ortho first and ? Then needs neuro eval next but strongly doubt sarcoid or a vascular cause for the numbness

## 2011-12-27 ENCOUNTER — Telehealth: Payer: Self-pay | Admitting: Internal Medicine

## 2011-12-27 NOTE — Telephone Encounter (Signed)
Pt requesting sample of Dulera 200 and Xopenex HFa. Also rescheduled PFT and OV with MW to a sooner appt date. This has been rescheduled for Wed., 4/24, PFT @ 12pm and OV with MW @ 2:15pm.

## 2012-01-19 ENCOUNTER — Encounter (HOSPITAL_COMMUNITY)
Admission: EM | Disposition: A | Discharge status: Home / Self Care | Payer: Self-pay | Source: Home / Self Care | Attending: Emergency Medicine

## 2012-01-19 ENCOUNTER — Encounter (HOSPITAL_COMMUNITY): Payer: Self-pay | Admitting: Anesthesiology

## 2012-01-19 ENCOUNTER — Emergency Department (HOSPITAL_COMMUNITY): Payer: BC Managed Care – PPO | Admitting: Anesthesiology

## 2012-01-19 ENCOUNTER — Observation Stay (HOSPITAL_COMMUNITY)
Admission: EM | Admit: 2012-01-19 | Discharge: 2012-01-20 | Disposition: A | Discharge status: Home / Self Care | Payer: BC Managed Care – PPO | Attending: Orthopedic Surgery | Admitting: Orthopedic Surgery

## 2012-01-19 ENCOUNTER — Emergency Department (HOSPITAL_COMMUNITY): Payer: BC Managed Care – PPO

## 2012-01-19 DIAGNOSIS — Z23 Encounter for immunization: Secondary | ICD-10-CM | POA: Insufficient documentation

## 2012-01-19 DIAGNOSIS — W319XXA Contact with unspecified machinery, initial encounter: Secondary | ICD-10-CM | POA: Insufficient documentation

## 2012-01-19 DIAGNOSIS — S91309A Unspecified open wound, unspecified foot, initial encounter: Secondary | ICD-10-CM

## 2012-01-19 DIAGNOSIS — Y92009 Unspecified place in unspecified non-institutional (private) residence as the place of occurrence of the external cause: Secondary | ICD-10-CM | POA: Insufficient documentation

## 2012-01-19 DIAGNOSIS — S91319A Laceration without foreign body, unspecified foot, initial encounter: Secondary | ICD-10-CM

## 2012-01-19 DIAGNOSIS — S91109A Unspecified open wound of unspecified toe(s) without damage to nail, initial encounter: Secondary | ICD-10-CM | POA: Insufficient documentation

## 2012-01-19 DIAGNOSIS — S98139A Complete traumatic amputation of one unspecified lesser toe, initial encounter: Principal | ICD-10-CM | POA: Insufficient documentation

## 2012-01-19 HISTORY — DX: Sickle-cell disease without crisis: D57.1

## 2012-01-19 HISTORY — PX: I & D EXTREMITY: SHX5045

## 2012-01-19 SURGERY — IRRIGATION AND DEBRIDEMENT EXTREMITY
Anesthesia: General | Site: Foot | Laterality: Right | Wound class: Dirty or Infected

## 2012-01-19 MED ORDER — BUPIVACAINE HCL 0.5 % IJ SOLN
50.0000 mL | Freq: Once | INTRAMUSCULAR | Status: AC
  Administered 2012-01-19: 50 mL

## 2012-01-19 MED ORDER — HYDROMORPHONE HCL PF 1 MG/ML IJ SOLN
1.0000 mg | Freq: Once | INTRAMUSCULAR | Status: AC
  Administered 2012-01-19: 1 mg via INTRAVENOUS
  Filled 2012-01-19: qty 1

## 2012-01-19 MED ORDER — FENTANYL CITRATE 0.05 MG/ML IJ SOLN
INTRAMUSCULAR | Status: DC | PRN
  Administered 2012-01-19 (×2): 50 ug via INTRAVENOUS

## 2012-01-19 MED ORDER — MIDAZOLAM HCL 5 MG/5ML IJ SOLN
INTRAMUSCULAR | Status: DC | PRN
  Administered 2012-01-19: 2 mg via INTRAVENOUS

## 2012-01-19 MED ORDER — ONDANSETRON HCL 4 MG/2ML IJ SOLN
4.0000 mg | Freq: Once | INTRAMUSCULAR | Status: AC
  Administered 2012-01-19: 4 mg via INTRAVENOUS
  Filled 2012-01-19: qty 2

## 2012-01-19 MED ORDER — SODIUM CHLORIDE 0.9 % IV SOLN
INTRAVENOUS | Status: DC | PRN
  Administered 2012-01-19: 22:00:00 via INTRAVENOUS

## 2012-01-19 MED ORDER — BUPIVACAINE HCL (PF) 0.5 % IJ SOLN
INTRAMUSCULAR | Status: AC
  Filled 2012-01-19: qty 20

## 2012-01-19 MED ORDER — HYDROMORPHONE HCL PF 1 MG/ML IJ SOLN: 0.2500 mg | INTRAMUSCULAR | Status: DC | PRN

## 2012-01-19 MED ORDER — ALBUTEROL SULFATE (2.5 MG/3ML) 0.083% IN NEBU
INHALATION_SOLUTION | RESPIRATORY_TRACT | Status: DC | PRN
  Administered 2012-01-19: 1 mg via RESPIRATORY_TRACT

## 2012-01-19 MED ORDER — GENTAMICIN IN SALINE 1-0.9 MG/ML-% IV SOLN
100.0000 mg | Freq: Once | INTRAVENOUS | Status: DC
  Filled 2012-01-19: qty 100

## 2012-01-19 MED ORDER — VANCOMYCIN HCL 1000 MG IV SOLR
1000.0000 mg | INTRAVENOUS | Status: DC | PRN
  Administered 2012-01-19: 1000 mg via INTRAVENOUS

## 2012-01-19 MED ORDER — TETANUS-DIPHTH-ACELL PERTUSSIS 5-2.5-18.5 LF-MCG/0.5 IM SUSP
0.5000 mL | Freq: Once | INTRAMUSCULAR | Status: AC
  Administered 2012-01-19: 0.5 mL via INTRAMUSCULAR
  Filled 2012-01-19: qty 0.5

## 2012-01-19 MED ORDER — CEFAZOLIN SODIUM 1-5 GM-% IV SOLN
1.0000 g | Freq: Once | INTRAVENOUS | Status: AC
  Administered 2012-01-19: 1 g via INTRAVENOUS
  Filled 2012-01-19: qty 50

## 2012-01-19 MED ORDER — SODIUM CHLORIDE 0.9 % IV BOLUS (SEPSIS)
1000.0000 mL | Freq: Once | INTRAVENOUS | Status: AC
  Administered 2012-01-19: 1000 mL via INTRAVENOUS

## 2012-01-19 MED ORDER — ONDANSETRON HCL 4 MG/2ML IJ SOLN
INTRAMUSCULAR | Status: DC | PRN
  Administered 2012-01-19: 4 mg via INTRAVENOUS

## 2012-01-19 MED ORDER — ONDANSETRON HCL 4 MG/2ML IJ SOLN: 4.0000 mg | Freq: Once | INTRAMUSCULAR | Status: AC | PRN

## 2012-01-19 MED ORDER — LACTATED RINGERS IV SOLN
INTRAVENOUS | Status: DC | PRN
  Administered 2012-01-19: 22:00:00 via INTRAVENOUS

## 2012-01-19 MED ORDER — SUCCINYLCHOLINE CHLORIDE 20 MG/ML IJ SOLN
INTRAMUSCULAR | Status: DC | PRN
  Administered 2012-01-19: 120 mg via INTRAVENOUS

## 2012-01-19 MED ORDER — SODIUM CHLORIDE 0.9 % IR SOLN
Status: DC | PRN
  Administered 2012-01-19: 1000 mL

## 2012-01-19 MED ORDER — PROPOFOL 10 MG/ML IV EMUL
INTRAVENOUS | Status: DC | PRN
  Administered 2012-01-19: 170 mg via INTRAVENOUS

## 2012-01-19 SURGICAL SUPPLY — 43 items
BANDAGE GAUZE ELAST BULKY 4 IN (GAUZE/BANDAGES/DRESSINGS) ×1 IMPLANT
BLADE SURG 10 STRL SS (BLADE) ×1 IMPLANT
BNDG COHESIVE 4X5 TAN STRL (GAUZE/BANDAGES/DRESSINGS) ×2 IMPLANT
BNDG COHESIVE 6X5 TAN STRL LF (GAUZE/BANDAGES/DRESSINGS) ×2 IMPLANT
BNDG GAUZE STRTCH 6 (GAUZE/BANDAGES/DRESSINGS) ×6 IMPLANT
CLOTH BEACON ORANGE TIMEOUT ST (SAFETY) ×2 IMPLANT
COTTON STERILE ROLL (GAUZE/BANDAGES/DRESSINGS) ×1 IMPLANT
COVER SURGICAL LIGHT HANDLE (MISCELLANEOUS) ×2 IMPLANT
CUFF TOURNIQUET SINGLE 18IN (TOURNIQUET CUFF) ×1 IMPLANT
CUFF TOURNIQUET SINGLE 24IN (TOURNIQUET CUFF) IMPLANT
CUFF TOURNIQUET SINGLE 34IN LL (TOURNIQUET CUFF) IMPLANT
CUFF TOURNIQUET SINGLE 44IN (TOURNIQUET CUFF) IMPLANT
DRAPE U-SHAPE 47X51 STRL (DRAPES) ×2 IMPLANT
DRSG ADAPTIC 3X8 NADH LF (GAUZE/BANDAGES/DRESSINGS) ×1 IMPLANT
DURAPREP 26ML APPLICATOR (WOUND CARE) ×1 IMPLANT
ELECT CAUTERY BLADE 6.4 (BLADE) IMPLANT
ELECT REM PT RETURN 9FT ADLT (ELECTROSURGICAL) ×2
ELECTRODE REM PT RTRN 9FT ADLT (ELECTROSURGICAL) IMPLANT
GLOVE BIOGEL PI IND STRL 9 (GLOVE) ×1 IMPLANT
GLOVE BIOGEL PI INDICATOR 9 (GLOVE) ×1
GLOVE SURG ORTHO 9.0 STRL STRW (GLOVE) ×2 IMPLANT
GOWN PREVENTION PLUS XLARGE (GOWN DISPOSABLE) ×2 IMPLANT
GOWN SRG XL XLNG 56XLVL 4 (GOWN DISPOSABLE) ×1 IMPLANT
GOWN STRL NON-REIN XL XLG LVL4 (GOWN DISPOSABLE) ×2
HANDPIECE INTERPULSE COAX TIP (DISPOSABLE) ×2
KIT BASIN OR (CUSTOM PROCEDURE TRAY) ×2 IMPLANT
KIT ROOM TURNOVER OR (KITS) ×2 IMPLANT
MANIFOLD NEPTUNE II (INSTRUMENTS) ×2 IMPLANT
NS IRRIG 1000ML POUR BTL (IV SOLUTION) ×2 IMPLANT
PACK ORTHO EXTREMITY (CUSTOM PROCEDURE TRAY) ×2 IMPLANT
PAD ARMBOARD 7.5X6 YLW CONV (MISCELLANEOUS) ×4 IMPLANT
PADDING CAST COTTON 6X4 STRL (CAST SUPPLIES) ×2 IMPLANT
SET HNDPC FAN SPRY TIP SCT (DISPOSABLE) IMPLANT
SPONGE GAUZE 4X4 12PLY (GAUZE/BANDAGES/DRESSINGS) ×2 IMPLANT
SPONGE LAP 18X18 X RAY DECT (DISPOSABLE) ×2 IMPLANT
STOCKINETTE IMPERVIOUS 9X36 MD (GAUZE/BANDAGES/DRESSINGS) ×2 IMPLANT
TOWEL OR 17X24 6PK STRL BLUE (TOWEL DISPOSABLE) ×2 IMPLANT
TOWEL OR 17X26 10 PK STRL BLUE (TOWEL DISPOSABLE) ×2 IMPLANT
TUBE ANAEROBIC SPECIMEN COL (MISCELLANEOUS) IMPLANT
TUBE CONNECTING 12X1/4 (SUCTIONS) ×2 IMPLANT
UNDERPAD 30X30 INCONTINENT (UNDERPADS AND DIAPERS) ×2 IMPLANT
WATER STERILE IRR 1000ML POUR (IV SOLUTION) ×2 IMPLANT
YANKAUER SUCT BULB TIP NO VENT (SUCTIONS) ×2 IMPLANT

## 2012-01-19 NOTE — ED Notes (Signed)
Lac to 2 toes on right foot from lawn mower.

## 2012-01-19 NOTE — Progress Notes (Signed)
ANTIBIOTIC CONSULT NOTE - INITIAL  Pharmacy Consult for Gentamicin Indication: open toe injuries, pre-op  No Known Allergies  Patient Measurements:   Wt ~70 kg  Vital Signs: Temp: 97.9 F (36.6 C) (04/17 1742) Temp src: Oral (04/17 1742) BP: 146/88 mmHg (04/17 1853) Pulse Rate: 82  (04/17 1853) Intake/Output from previous day:   Intake/Output from this shift:    Labs: No results found for this basename: WBC:3,HGB:3,PLT:3,LABCREA:3,CREATININE:3 in the last 72 hours The CrCl is unknown because both a height and weight (above a minimum accepted value) are required for this calculation.  Microbiology: No results found for this or any previous visit (from the past 720 hour(s)).  Medical History: Past Medical History  Diagnosis Date  . Asthma   . Sarcoidosis   . Sickle cell trait    Assessment: 46 yo M admitted with open fracture of toes, to go to OR.  Gentamicin ordered x 1 dose, will dose 1.5 mg/kg.  Received Ancef 1 gm IV at 1939 in ED.   Plan:  1.  Gentamicin 100 mg IV x 1 dose pre-op 2.  F/up post OR for additional doses if warranted  Rolland Porter, Pharm.D., BCPS Clinical Pharmacist Pager: [phone]

## 2012-01-19 NOTE — ED Provider Notes (Signed)
History     CSN: [id number]  Arrival date & time 01/19/12  1739   First MD Initiated Contact with Patient 01/19/12 1759      Chief Complaint  Patient presents with  . Laceration    (Consider location/radiation/quality/duration/timing/severity/associated sxs/prior treatment) Patient is a 46 y.o. male presenting with skin laceration. The history is provided by the patient.  Laceration   He was mowing his lawn when he slipped on the grass and his right foot she slid under the mower and he suffered a laceration to the toes of his right foot. He denies other injury. He is up-to-date on tetanus immunizations. Pain is severe and he rates it at 10 out of 10.  Past Medical History  Diagnosis Date  . Asthma   . Sarcoidosis   . Sickle cell trait     Past Surgical History  Procedure Date  . Shoulder open rotator cuff repair 2007    Family History  Problem Relation Age of Onset  . Asthma Mother   . Coronary artery disease Mother   . Stroke Mother   . Heart attack Mother   . Sickle cell trait Mother   . Prostate cancer Father   . Cancer Brother     ? type  . Drug abuse Brother     History  Substance Use Topics  . Smoking status: Former Smoker -- 0.5 packs/day for 10 years    Quit date: 10/04/1993  . Smokeless tobacco: Never Used  . Alcohol Use: 1.5 oz/week    3 drink(s) per week      Review of Systems  All other systems reviewed and are negative.    Allergies  Review of patient's allergies indicates no known allergies.  Home Medications   Current Outpatient Rx  Name Route Sig Dispense Refill  . HYDROCODONE-ACETAMINOPHEN 10-325 MG PO TABS Oral Take 1 tablet by mouth every 6 (six) hours as needed. For pain    . IBUPROFEN 600 MG PO TABS Oral Take 600 mg by mouth every 6 (six) hours as needed. For pain    . LEVALBUTEROL TARTRATE 45 MCG/ACT IN AERO Inhalation Inhale 2 puffs into the lungs every 4 (four) hours as needed. For shortness of breath    . MOMETASONE  FURO-FORMOTEROL FUM 200-5 MCG/ACT IN AERO Inhalation Inhale 2 puffs into the lungs 2 (two) times daily.      BP 152/93  Pulse 108  Temp(Src) 97.9 F (36.6 C) (Oral)  Resp 24  Physical Exam  Nursing note and vitals reviewed.  46 year old male who is in pain. Vital signs are significant for mild tachycardia with heart rate of 108 and mild tachypnea with respiratory rate of 24 and mild hypertension with blood pressure 152/93. Oxygen saturation is 98% which is normal. Head is normocephalic and atraumatic. PERRLA, EOMI. Oropharynx is clear. Neck is nontender and supple. Back is nontender. Lungs are clear without rales, wheezes, or rhonchi. Heart has regular rate and rhythm without murmur. Abdomen is soft, flat, nontender without masses or hepatosplenomegaly. Extremities: Minor abrasions are seen in the anterior aspect of the right lower leg. There are lacerations of the plantar surface of the right first and second toes and a laceration to the nailbed of the first toe. No deformity is seen. No other extremity injuries seen. Skin is diaphoretic without rash. Neurologic: Mental status is normal, cranial nerves are intact, there no focal motor or sensory deficits.  ED Course  Procedures (including critical care time)  Dg Foot Complete Right  01/19/2012  *RADIOLOGY REPORT*  Clinical Data: Foot caught body along normal.  RIGHT FOOT COMPLETE - 3+ VIEW  Comparison: None.  Findings: The patient has a fracture of the tuft of the great toe. Also seen is a chip fracture off the base of the distal phalanx of the great toe on the lateral side.  Tuft fracture of the second toe is identified.  There is a comminuted fracture of the distal phalanx of the third toe.  Nondisplaced fracture of the neck of the second metatarsal is identified.  IMPRESSION: Fractures of the distal phalanges of the first, second and third toes and neck of the second metatarsal as above.  Original Report Authenticated By: Bernadene Bell. D'ALESSIO,  M.D.     X-rays show fractures of the first second and third toes in the distal metatarsal of the second to. Because of the contracture, he was given a dose of Ancef. Consultation has been obtained with Dr. Lajoyce Corners who also requests a dose of gentamicin be given and he will take the patient to the operating room for her wound irrigation..  1. Foot laceration with complication    open fracture of right first and second toes, closed fracture of right third toe    MDM  Lacerations to the toes of the right foot appear to be obtained to rule out underlying fracture.        Dione Booze, MD 01/21/12 [phone]

## 2012-01-19 NOTE — ED Notes (Signed)
Pt reporting cut right big toe and second toe from lawnmower. Wrapped foot in towel. Upon examination pt has laceration to bottom of great toe and second toe. No bone visible. Bleeding controlled. Pt alert, talking, pt anxious. Family at bedside.

## 2012-01-19 NOTE — Anesthesia Preprocedure Evaluation (Addendum)
Anesthesia Evaluation  Patient identified by MRN, date of birth, ID band Patient awake    Reviewed: Allergy & Precautions, H&P , NPO status , Patient's Chart, lab work & pertinent test results  Airway Mallampati: II TM Distance: >3 FB Neck ROM: Full    Dental  (+) Teeth Intact and Dental Advisory Given   Pulmonary asthma ,  Pt has sarcoidosis x 7 yrs. breath sounds clear to auscultation        Cardiovascular Rhythm:Regular Rate:Normal     Neuro/Psych    GI/Hepatic negative GI ROS, Neg liver ROS,   Endo/Other    Renal/GU   negative genitourinary   Musculoskeletal negative musculoskeletal ROS (+)   Abdominal   Peds  Hematology negative hematology ROS (+) Pt Has SS trait   Anesthesia Other Findings   Reproductive/Obstetrics negative OB ROS                          Anesthesia Physical Anesthesia Plan  ASA: II  Anesthesia Plan: General   Post-op Pain Management:    Induction: Intravenous  Airway Management Planned: Oral ETT  Additional Equipment:   Intra-op Plan:   Post-operative Plan: Extubation in OR  Informed Consent: I have reviewed the patients History and Physical, chart, labs and discussed the procedure including the risks, benefits and alternatives for the proposed anesthesia with the patient or authorized representative who has indicated his/her understanding and acceptance.   Dental advisory given  Plan Discussed with: CRNA and Surgeon  Anesthesia Plan Comments: (H/O Sarcoidosis with RAD Wound R. Foot)        Anesthesia Quick Evaluation

## 2012-01-19 NOTE — Anesthesia Postprocedure Evaluation (Signed)
  Anesthesia Post-op Note  Patient: [name]  Procedure(s) Performed: Procedure(s) (LRB): IRRIGATION AND DEBRIDEMENT EXTREMITY (Right)  Patient Location: PACU  Anesthesia Type: General and GA combined with regional for post-op pain  Level of Consciousness: awake, alert  and oriented  Airway and Oxygen Therapy: Patient Spontanous Breathing  Post-op Pain: none  Post-op Assessment: Post-op Vital signs reviewed and Patient's Cardiovascular Status Stable  Post-op Vital Signs: stable  Complications: No apparent anesthesia complications

## 2012-01-19 NOTE — ED Notes (Signed)
Pt. Went to x-ray

## 2012-01-19 NOTE — H&P (Signed)
[name] is an 46 y.o. male.   Chief Complaint: Lawnmower injury to the right foot. HPI: Patient is a 46 year old gentleman who slipped mowing his yard his foot went under the lawnmower and sustained lacerations to the toes of the right foot. Radiographs shows a fracture of the metatarsals and phalanx to the toes patient is open lacerations on the plantar aspect of his foot and patient presents at this time for her foot salvage intervention for irrigation and debridement and wound closure his.  Past Medical History  Diagnosis Date  . Asthma   . Sarcoidosis   . Sickle cell trait     Past Surgical History  Procedure Date  . Shoulder open rotator cuff repair 2007    Family History  Problem Relation Age of Onset  . Asthma Mother   . Coronary artery disease Mother   . Stroke Mother   . Heart attack Mother   . Sickle cell trait Mother   . Prostate cancer Father   . Cancer Brother     ? type  . Drug abuse Brother    Social History:  reports that he quit smoking about 18 years ago. He has never used smokeless tobacco. He reports that he drinks about 1.5 ounces of alcohol per week. His drug history not on file.  Allergies: No Known Allergies  Medications Prior to Admission  Medication Dose Route Frequency Provider Last Rate Last Dose  . bupivacaine (MARCAINE) 0.5 % (with pres) injection 50 mL  50 mL Infiltration Once Dione Booze, MD   50 mL at 01/19/12 2053  . ceFAZolin (ANCEF) IVPB 1 g/50 mL premix  1 g Intravenous Once Dione Booze, MD   1 g at 01/19/12 1939  . gentamicin (GARAMYCIN) IVPB 100 mg  100 mg Intravenous Once Elonda Husky, PHARMD      . HYDROmorphone (DILAUDID) injection 1 mg  1 mg Intravenous Once Dione Booze, MD   1 mg at 01/19/12 1816  . HYDROmorphone (DILAUDID) injection 1 mg  1 mg Intravenous Once Dione Booze, MD   1 mg at 01/19/12 2059  . ondansetron (ZOFRAN) injection 4 mg  4 mg Intravenous Once Dione Booze, MD   4 mg at 01/19/12 1816  . sodium chloride 0.9  % bolus 1,000 mL  1,000 mL Intravenous Once Dione Booze, MD   1,000 mL at 01/19/12 1815  . TDaP (BOOSTRIX) injection 0.5 mL  0.5 mL Intramuscular Once Dione Booze, MD   0.5 mL at 01/19/12 1940   Medications Prior to Admission  Medication Sig Dispense Refill  . ibuprofen (ADVIL,MOTRIN) 600 MG tablet Take 600 mg by mouth every 6 (six) hours as needed. For pain      . levalbuterol (XOPENEX HFA) 45 MCG/ACT inhaler Inhale 2 puffs into the lungs every 4 (four) hours as needed. For shortness of breath      . Mometasone Furo-Formoterol Fum 200-5 MCG/ACT AERO Inhale 2 puffs into the lungs 2 (two) times daily.      Marland Kitchen DISCONTD: levalbuterol (XOPENEX HFA) 45 MCG/ACT inhaler 2 puffs every 4 hours if you can't catch your breath  1 Inhaler  2  . DISCONTD: Mometasone Furo-Formoterol Fum (DULERA) 200-5 MCG/ACT AERO First thing in am and 2 puffs again in pm about 12 hours later  1 Inhaler  11    No results found for this or any previous visit (from the past 48 hour(s)). Dg Foot Complete Right  01/19/2012  *RADIOLOGY REPORT*  Clinical Data: Foot caught body along  normal.  RIGHT FOOT COMPLETE - 3+ VIEW  Comparison: None.  Findings: The patient has a fracture of the tuft of the great toe. Also seen is a chip fracture off the base of the distal phalanx of the great toe on the lateral side.  Tuft fracture of the second toe is identified.  There is a comminuted fracture of the distal phalanx of the third toe.  Nondisplaced fracture of the neck of the second metatarsal is identified.  IMPRESSION: Fractures of the distal phalanges of the first, second and third toes and neck of the second metatarsal as above.  Original Report Authenticated By: Bernadene Bell. Maricela Curet, M.D.    Review of Systems  All other systems reviewed and are negative.    Blood pressure 146/88, pulse 82, temperature 97.9 F (36.6 C), temperature source Oral, resp. rate 14, SpO2 99.00%. Physical Exam  On examination patient has multiple lacerations to  his toes and forefoot. He has good pulses. There are no ischemic changes. His foot is generally neurovascularly intact. Radiographs shows the multiple fractures of the forefoot. Assessment/Plan Assessment: Multiple fractures of the right forefoot with multiple lacerations and open fractures secondary to a lawnmower injury.  Plan: We will plan for irrigation debridement and closure of the wounds. We'll admit placed on IV Kefzol and IV gentamicin he did receive a tetanus booster.  Shaleka Brines V 01/19/2012, 9:32 PM

## 2012-01-19 NOTE — Transfer of Care (Signed)
Immediate Anesthesia Transfer of Care Note  Patient: [name]  Procedure(s) Performed: Procedure(s) (LRB): IRRIGATION AND DEBRIDEMENT EXTREMITY (Right)  Patient Location: PACU  Anesthesia Type: General  Level of Consciousness: awake, alert  and oriented  Airway & Oxygen Therapy: Patient Spontanous Breathing and Patient connected to nasal cannula oxygen  Post-op Assessment: Report given to PACU RN, Post -op Vital signs reviewed and stable and Patient moving all extremities  Post vital signs: Reviewed and stable  Complications: No apparent anesthesia complications

## 2012-01-19 NOTE — Op Note (Signed)
OPERATIVE REPORT  DATE OF SURGERY: 01/19/2012  PATIENT:  [name],  46 y.o. male  PRE-OPERATIVE DIAGNOSIS:  Right Foot Injury, lawnmower injury to the forefoot  POST-OPERATIVE DIAGNOSIS:  Right Foot lawn mower Injury  PROCEDURE:  Procedure(s): IRRIGATION AND DEBRIDEMENT EXTREMITY. Partial amputation of the tuft of the right great toe. Nail bed repair right great toe. Repair traumatic injury right second toe  SURGEON:  Surgeon(s): Nadara Mustard, MD  ANESTHESIA:   regional and general  EBL:  Minimal ML  SPECIMEN:  No Specimen  TOURNIQUET:  * No tourniquets in log *  PROCEDURE DETAILS: Patient is a 46 year old gentleman who was mowing the yard going down a hill when his foot slipped when the lawnmower and sustained a partial amputations to the great toe and second toe right foot. Patient was brought into the emergency room was not felt to be able to be surgically debrided in the emergency room and presents at this time for surgical debridement repair and foot salvage of the forefoot. Risks and benefits were discussed including infection neurovascular injury nonhealing of the wounds potential for amputation. Patient states he understands and wished to proceed at this time. Description of procedure patient was brought to OR room 1 and underwent a general anesthetic after a popliteal block. After adequate levels of anesthesia were obtained patient's right lower extremity was first prepped using a 9 paint and Betadine scrub dried and then prepped into a sterile field using DuraPrep. The wounds were again irrigated with pulsatile lavage patient had partial amputation of the tuft of the right great toe this was debrided back to a stable viable bone. The nailbed was injured and patient underwent nail bed repair with 3-0 Monocryl. The laceration to the toe was repaired using 2-0 nylon. Examination of the second toe again was irrigated debrided and the traumatic wound 2 cm in length was repaired  using 2-0 nylon. The wounds were irrigated were irrigated covered with Adaptic orthopedic sponges labral and Coban. Patient was extubated taken to the PACU in stable condition plan to continue and obtain 124 hour dose of gentamicin. Plan to discharge tomorrow.  PLAN OF CARE: Admit for overnight observation  PATIENT DISPOSITION:  PACU - hemodynamically stable.   Nadara Mustard, MD 01/19/2012 10:37 PM

## 2012-01-19 NOTE — Anesthesia Procedure Notes (Signed)
Anesthesia Regional Block:  Popliteal block  Pre-Anesthetic Checklist: ,, timeout performed, Correct Patient, Correct Site, Correct Laterality, Correct Procedure, Correct Position, site marked, Risks and benefits discussed,  Surgical consent,  Pre-op evaluation,  At surgeon's request and post-op pain management  Laterality: Right  Prep: chloraprep       Needles:  Injection technique: Single-shot  Needle Type: Echogenic Stimulator Needle     Needle Length:cm 9 cm Needle Gauge: 22 and 22 G    Additional Needles:  Procedures: ultrasound guided and nerve stimulator Popliteal block Narrative:  Start time: 01/19/2012 9:45 PM End time: 01/19/2012 9:55 PM  Performed by: Personally   Additional Notes: 25 cc 0.5% marcaine with 1:200 Epi  Kipp Brood, MD  Popliteal block

## 2012-01-19 NOTE — ED Notes (Signed)
Pt. Back from xray. Family at bedside.

## 2012-01-20 ENCOUNTER — Encounter (HOSPITAL_COMMUNITY): Payer: Self-pay | Admitting: Neurology

## 2012-01-20 MED ORDER — ONDANSETRON HCL 4 MG PO TABS: 4.0000 mg | ORAL_TABLET | Freq: Four times a day (QID) | ORAL | Status: DC | PRN

## 2012-01-20 MED ORDER — OXYCODONE-ACETAMINOPHEN 5-325 MG PO TABS
1.0000 | ORAL_TABLET | ORAL | Status: AC | PRN
Start: 2012-01-20 — End: 2012-01-30

## 2012-01-20 MED ORDER — CEFAZOLIN SODIUM 1-5 GM-% IV SOLN
1.0000 g | Freq: Four times a day (QID) | INTRAVENOUS | Status: DC
  Administered 2012-01-20 (×2): 1 g via INTRAVENOUS
  Filled 2012-01-20 (×3): qty 50

## 2012-01-20 MED ORDER — ONDANSETRON HCL 4 MG/2ML IJ SOLN: 4.0000 mg | Freq: Four times a day (QID) | INTRAMUSCULAR | Status: DC | PRN

## 2012-01-20 MED ORDER — OXYCODONE-ACETAMINOPHEN 5-325 MG PO TABS
1.0000 | ORAL_TABLET | ORAL | Status: DC | PRN
  Administered 2012-01-20: 2 via ORAL
  Filled 2012-01-20 (×2): qty 2

## 2012-01-20 MED ORDER — METOCLOPRAMIDE HCL 5 MG PO TABS: 5.0000 mg | ORAL_TABLET | Freq: Three times a day (TID) | ORAL | Status: DC | PRN

## 2012-01-20 MED ORDER — HYDROMORPHONE HCL PF 1 MG/ML IJ SOLN: 0.5000 mg | INTRAMUSCULAR | Status: DC | PRN

## 2012-01-20 MED ORDER — HYDROCODONE-ACETAMINOPHEN 5-325 MG PO TABS: 1.0000 | ORAL_TABLET | ORAL | Status: DC | PRN

## 2012-01-20 MED ORDER — METOCLOPRAMIDE HCL 5 MG/ML IJ SOLN
5.0000 mg | Freq: Three times a day (TID) | INTRAMUSCULAR | Status: DC | PRN
  Filled 2012-01-20: qty 2

## 2012-01-20 MED ORDER — FLUTICASONE-SALMETEROL 250-50 MCG/DOSE IN AEPB
1.0000 | INHALATION_SPRAY | Freq: Two times a day (BID) | RESPIRATORY_TRACT | Status: DC
  Administered 2012-01-20: 1 via RESPIRATORY_TRACT
  Filled 2012-01-20: qty 14

## 2012-01-20 MED ORDER — LEVALBUTEROL TARTRATE 45 MCG/ACT IN AERO: 1.0000 | INHALATION_SPRAY | Freq: Four times a day (QID) | RESPIRATORY_TRACT | Status: DC | PRN

## 2012-01-20 MED ORDER — SODIUM CHLORIDE 0.9 % IV SOLN
INTRAVENOUS | Status: DC
  Administered 2012-01-20: 02:00:00 via INTRAVENOUS

## 2012-01-20 MED ORDER — LEVALBUTEROL TARTRATE 45 MCG/ACT IN AERO
2.0000 | INHALATION_SPRAY | RESPIRATORY_TRACT | Status: DC | PRN
  Administered 2012-01-20: 2 via RESPIRATORY_TRACT
  Filled 2012-01-20: qty 15

## 2012-01-20 MED ORDER — MOMETASONE FURO-FORMOTEROL FUM 200-5 MCG/ACT IN AERO: 2.0000 | INHALATION_SPRAY | Freq: Two times a day (BID) | RESPIRATORY_TRACT | Status: DC

## 2012-01-20 NOTE — Discharge Summary (Signed)
Physician Discharge Summary  Patient ID: [name] MRN: [id number] DOB/AGE: 1966/07/10 46 y.o.  Admit date: 01/19/2012 Discharge date: 01/20/2012  Admission Diagnoses:lawnmower laceration right foot  Discharge Diagnoses: same Active Problems:  * No active hospital problems. *    Discharged Condition: stable  Hospital Course: irrigation debridment and repair right foot, and IV antibiotics  Consults: None  Significant Diagnostic Studies: labs: routine labs  Treatments: surgery: see operative note  Discharge Exam: Blood pressure 127/83, pulse 88, temperature 97.6 F (36.4 C), temperature source Oral, resp. rate 20, height 5\' 7"  (1.702 m), weight 69.7 kg (153 lb 10.6 oz), SpO2 97.00%. Incision/Wound:clean and dry  Disposition: Final discharge disposition not confirmed  Discharge Orders    Future Appointments: Provider: Department: Dept Phone: Center:   01/26/2012 12:00 PM [facility] [phone] None   01/26/2012 2:15 PM Nyoka Cowden, MD [facility] [phone] None     Future Orders Please Complete By Expires   Diet - low sodium heart healthy      Call MD / Call 911      Comments:   If you experience chest pain or shortness of breath, CALL 911 and be transported to the hospital emergency room.  If you develope a fever above 101 F, pus (white drainage) or increased drainage or redness at the wound, or calf pain, call your surgeon's office.   Constipation Prevention      Comments:   Drink plenty of fluids.  Prune juice may be helpful.  You may use a stool softener, such as Colace (over the counter) 100 mg twice a day.  Use MiraLax (over the counter) for constipation as needed.   Increase activity slowly as tolerated      Weight Bearing as taught in Physical Therapy      Comments:   Use a walker or crutches as instructed.     Medication List  As of 01/20/2012  7:18 AM   TAKE these medications         HYDROcodone-acetaminophen 10-325 MG  per tablet   Commonly known as: NORCO   Take 1 tablet by mouth every 6 (six) hours as needed. For pain      ibuprofen 600 MG tablet   Commonly known as: ADVIL,MOTRIN   Take 600 mg by mouth every 6 (six) hours as needed. For pain      levalbuterol 45 MCG/ACT inhaler   Commonly known as: XOPENEX HFA   Inhale 2 puffs into the lungs every 4 (four) hours as needed. For shortness of breath      Mometasone Furo-Formoterol Fum 200-5 MCG/ACT Aero   Inhale 2 puffs into the lungs 2 (two) times daily.      oxyCODONE-acetaminophen 5-325 MG per tablet   Commonly known as: PERCOCET   Take 1 tablet by mouth every 4 (four) hours as needed for pain.           Follow-up Information    Follow up with Anastaisa Wooding V, MD in 2 weeks.   Contact information:   [address] [place] [address] [phone]          Signed: Nadara Morris 01/20/2012, 7:18 AM

## 2012-01-20 NOTE — Evaluation (Signed)
Agree with student PT evaluation.  Jettie Lazare, PT DPT [phone]  

## 2012-01-20 NOTE — Discharge Instructions (Signed)
Elevation, non weight bearing right foot

## 2012-01-20 NOTE — Evaluation (Signed)
Physical Therapy Evaluation Patient Details Name: [name] MRN: [id number] DOB: Aug 12, 1966 Today's Date: 01/20/2012  Problem List:  Patient Active Problem List  Diagnoses  . Sarcoidosis  . ALCOHOL ABUSE  . ALLERGIC RHINITIS  . ASTHMA, PERSISTENT  . Numbness and tingling in right hand    Past Medical History:  Past Medical History  Diagnosis Date  . Asthma   . Sarcoidosis   . Sickle cell trait   . Sickle cell anemia   . Sarcoidosis    Past Surgical History:  Past Surgical History  Procedure Date  . Shoulder open rotator cuff repair 2007    PT Assessment/Plan/Recommendation PT Assessment Clinical Impression Statement: Pt is a 46 y.o. male admitted s/p laceration to the right first and second toe.  Pt is NWB on RLE and demonstrates resulting decreased moblity, impaired gait, and impaired knowledge of DME.  Pt does not need skilled physical therapy in the acute setting due to adequate care giver support. PT Recommendation/Assessment: Patent does not need any further PT services No Skilled PT: All education completed;Patient will have necessary level of assist by caregiver at discharge PT Recommendation Follow Up Recommendations: No PT follow up;Supervision - Intermittent Equipment Recommended: Rolling walker with 5" wheels;Tub/shower bench PT Goals     PT Evaluation Precautions/Restrictions  Restrictions Weight Bearing Restrictions: Yes RLE Weight Bearing: Non weight bearing Prior Functioning  Home Living Lives With: Family Available Help at Discharge: Family Type of Home: House Home Access: Stairs to enter Secretary/administrator of Steps: 6 Entrance Stairs-Rails: Right;Left;Can reach both Home Layout: One level Bathroom Shower/Tub: Engineer, manufacturing systems: Standard Bathroom Accessibility: Yes How Accessible: Accessible via walker Home Adaptive Equipment: None Prior Function Level of Independence: Independent Able to Take Stairs?:  Reciprically Driving: Yes Vocation: Full time employment Cognition Cognition Overall Cognitive Status: Appears within functional limits for tasks assessed/performed Arousal/Alertness: Awake/alert Orientation Level: Oriented X4 / Intact Behavior During Session: WFL for tasks performed Sensation/Coordination Sensation Light Touch: Appears Intact Coordination Gross Motor Movements are Fluid and Coordinated: Yes Fine Motor Movements are Fluid and Coordinated: Yes Extremity Assessment RLE Assessment RLE Assessment: Exceptions to [facility] RLE Strength RLE Overall Strength: Unable to assess RLE Overall Strength Comments: UTA due to surgery and NWB LLE Assessment LLE Assessment: Within Functional Limits Mobility (including Balance) Bed Mobility Bed Mobility: Yes Supine to Sit: 5: Supervision Supine to Sit Details (indicate cue type and reason): Pt required supervision fro safety. Sitting - Scoot to Edge of Bed: 7: Independent Transfers Transfers: Yes Sit to Stand: 4: Min assist;From bed;From chair/3-in-1;With upper extremity assist;With armrests Sit to Stand Details (indicate cue type and reason): Pt required min assist to initiate transfer due to RLE NWB. Stand to Sit: 4: Min assist;To chair/3-in-1;With upper extremity assist;With armrests Stand to Sit Details: Pt required min assist to control descent into chair. Ambulation/Gait Ambulation/Gait: Yes Ambulation/Gait Assistance: Other (comment) (Min guard) Ambulation/Gait Assistance Details (indicate cue type and reason): Pt required min guard assist for safety. Ambulation Distance (Feet): 230 Feet Assistive device: Rolling walker Gait Pattern:  (Hop on LLE) Gait velocity: Decreased Stairs: Yes Stairs Assistance: Other (comment) (Min guard) Stairs Assistance Details (indicate cue type and reason): Pt required min assist for safety with VC for sequencing. Stair Management Technique: Two rails;Backwards Number of Stairs: 3    Posture/Postural Control Posture/Postural Control: No significant limitations Balance Balance Assessed: Yes Static Sitting Balance Static Sitting - Balance Support: Bilateral upper extremity supported;Feet unsupported Static Sitting - Level of Assistance: 7: Independent Static Sitting -  Comment/# of Minutes: 5 minutes Exercise    End of Session PT - End of Session Equipment Utilized During Treatment: Gait belt Activity Tolerance: Patient limited by fatigue;Patient limited by pain Patient left: in chair;with call bell in reach Nurse Communication: Mobility status for transfers;Mobility status for ambulation General Behavior During Session: [facility] for tasks performed Cognition: [facility] for tasks performed  Ezzard Standing SPT 01/20/2012, 3:58 PM

## 2012-01-20 NOTE — Progress Notes (Signed)
CARE MANAGEMENT NOTE 01/20/2012  Patient:  [name], [name]   Account Number:  [id number]  Date Initiated:  01/20/2012  Documentation initiated by:  Vance Peper  Subjective/Objective Assessment:   46 yr old male s/p I & D and repair of traumatic injury to right second toe, partial amputation of right great toe secondary to lawn mower accident.     Action/Plan:   DME needed   Anticipated DC Date:  01/20/2012   Anticipated DC Plan:  HOME W HOME HEALTH SERVICES      DC Planning Services  CM consult      [facility] Choice  DURABLE MEDICAL EQUIPMENT   Choice offered to / List presented to:     DME arranged  WALKER - ROLLING  TUB BENCH      DME agency  [facility]     HH arranged  NA      HH agency  NA   Status of service:  Completed, signed off  Discharge Disposition:  HOME/SELF CARE

## 2012-01-24 ENCOUNTER — Telehealth: Payer: Self-pay

## 2012-01-24 NOTE — Telephone Encounter (Signed)
Spoke to pt and he stated he decided to keep his appt because his breathing seems to be a little worse than normal and he feels he is wheezing more--so nothing further needed pt will keep appt for Wednesday 01/26/12

## 2012-01-26 ENCOUNTER — Ambulatory Visit (INDEPENDENT_AMBULATORY_CARE_PROVIDER_SITE_OTHER): Payer: BC Managed Care – PPO | Admitting: Internal Medicine

## 2012-01-26 ENCOUNTER — Encounter: Payer: Self-pay | Admitting: Internal Medicine

## 2012-01-26 VITALS — BP 90/60 | HR 100 | Temp 98.2°F | Ht 69.0 in | Wt 158.0 lb

## 2012-01-26 DIAGNOSIS — D869 Sarcoidosis, unspecified: Secondary | ICD-10-CM

## 2012-01-26 DIAGNOSIS — J45909 Unspecified asthma, uncomplicated: Secondary | ICD-10-CM

## 2012-01-26 LAB — PULMONARY FUNCTION TEST

## 2012-01-26 MED ORDER — MOMETASONE FURO-FORMOTEROL FUM 200-5 MCG/ACT IN AERO: 2.0000 | INHALATION_SPRAY | Freq: Two times a day (BID) | RESPIRATORY_TRACT | Status: DC

## 2012-01-26 NOTE — Patient Instructions (Addendum)
dulera 200 Take 2 puffs first thing in am and then another 2 puffs about 12 hours later.   Please schedule a follow up office visit in 6 weeks, call sooner if needed - bring all inhalers with you

## 2012-01-26 NOTE — Assessment & Plan Note (Signed)
-   hfa 90% 07/23/2011 > confirmed 10/01/2011     - PFT's 01/26/2012 FEV1  1.46 (39%) ratio 49 with 31% p B2, DLCO100%  DDX of  difficult airways managment all start with A and  include Adherence, Ace Inhibitors, Acid Reflux, Active Sinus Disease, Alpha 1 Antitripsin deficiency, Anxiety masquerading as Airways dz,  ABPA,  allergy(esp in young), Aspiration (esp in elderly), Adverse effects of DPI,  Active smokers, plus two Bs  = Bronchiectasis and Beta blocker use..and one C= CHF  Adherence is always the initial "prime suspect" and is a multilayered concern that requires a "trust but verify" approach in every patient - starting with knowing how to use medications, especially inhalers, correctly, keeping up with refills and understanding the fundamental difference between maintenance and prns vs those medications only taken for a very short course and then stopped and not refilled. The proper method of use, as well as anticipated side effects, of this metered-dose inhaler are discussed and demonstrated to the patient. Improved to 90%  See instructions for specific recommendations which were reviewed directly with the patient who was given a copy with highlighter outlining the key components.

## 2012-01-26 NOTE — Progress Notes (Signed)
PFT done today. 

## 2012-01-26 NOTE — Progress Notes (Signed)
Subjective:    Patient ID: [name], male    DOB: Sep 27, 1966     MRN: [id number]  HPI  46 yobm former smoker quit around 1995,  dx with sarcoid 05/2004  by tbbx on prednisone off and on since then with inconsistent adherence documented.   December 13, 2008--Last seen 3/09- did not return for follow up. Since last viist. Tapered off prednisone, he has taken on/off x one year. Would take when he was dyspneic, wheezing/ and cough. Over last 2 weeks increased cough, tightness, dyspnea, and wheezing. Almost out of prednisone. --CXR hyperinflation of the lungs with enlarged hilar tissue  Minimally increased.  ACE at 46. Predisone restarted at 20mg  once daily.  December 18, 2008-- Returns compalining that breathing no better. still has increased SOB since last OV, wheezing, prod cough with white sputum - worse since last OV. Pt misunderstood instructions, only on prednisone 10mg  once daily. Rec 20 mg per day   January 02, 2009 still on 20 mg per day and finally turning the corner with less sob and cough. rec Prednisone 10 mg 2 each am until 100% better, then 1 daily x 2weeks then one half daily until seen   March 04, 2010 SOB/Wheezing/...paztient states he is working with [name] and SOB has been worsened....patient states he just found out he has been exposed to asbestos for last 5 yrs. Pt denies any significant sore throat, dysphagia, itching, sneezing, nasal congestion or excess secretions, fever, chills, sweats, unintended wt loss, pleuritic or exertional cp, hempoptysis, change in activity tolerance orthopnea pnd or leg swelling. increasing need for [name] x sev weeks, less responsive now and therefore increased prednisone to 20 mg per day x 2 weeks, minimally better.   rec add dulera 200, taper pred to 5 mg if tolerates, f/u 4 weeks > lost insurance, did not return   05/28/2011 f/u ov/[name] cc much worse since ran out of prednisone x 2 weeks ago had taperd down to 10 mg per day. Thinks dulera helped while  on it but also ran out of it over a year ago and had just been relying on the prednisone.  Main problems are minimally prod cough and sob with minimal adls and noct wheeze with R > L ant chest tightness. Called in prednisone, could not afford to fill rx. No nausea, ocular or articular co's or rash. rec Dulera 200 Take 2 puffs first thing in am and then another 2 puffs about 12 hours later.  Work on inhaler technique: .    Prednisone tablets 10mg   2 daily until 100% then 1 daily x 1 week then 1/2 daily x 1 week then try stopping (only going to work if stay on dulera 200)    07/23/2011 f/u ov/[name] cc 100% while on prednisone and dulera 200 but not consistent and worse p finished prednisone x  Days before ov / has also run out of albuterol.  No purulent  sputum, ocular or articular symptoms rec Remember the dulera Take 2 puffs first thing in am and then another 2 puffs about 12 hours later.  Only use your [name] sample as a rescue medication    10/01/2011 ov/[name] off prednisone since Oct 2012 again ran out of both inhlaers before ov> worse sob since, minimal cough.   Main cc is indolent onset x sev weeks  of  worsening R shoulder pain radiating down to his forearm assoc with tingling and numbness in all 5 fingers. No neck pain, no fever. Has f/u with  his othopedic doctor already scheduled.  Denies trauma. rec Remember the dulera Take 2 puffs first thing in am and then another 2 puffs about 12 hours later.   Prednisone 10 mg take  4 each am x 2 days,   2 each am x 2 days,  1 each am x2days and stop   Only use your [name] sample as a rescue medication    01/26/2012 f/u ov/[name] cc doe x chasing kids, using xopenox every night but none during the day, returns for pft's with outdated dulera 200 down to zero, not clear from records he's filled any precriptions. No purulent sputum, ocular or articular symptoms or rash.  Also denies any obvious fluctuation of symptoms with weather or environmental  changes or other aggravating or alleviating factors except as outlined above   ROS  At present neg for  any significant sore throat, dysphagia, dental problems, itching, sneezing,  nasal congestion or excess/ purulent secretions, ear ache,   fever, chills, sweats, unintended wt loss, pleuritic or exertional cp, hemoptysis, palpitations, orthopnea pnd or leg swelling.  Also denies presyncope, palpitations, heartburn, abdominal pain, anorexia, nausea, vomiting, diarrhea  or change in bowel or urinary habits, change in stools or urine, dysuria,hematuria,  rash, arthralgias, visual complaints, headache, numbness weakness or ataxia or problems with walking or coordination. No noted change in mood/affect or memory.                     Past Medical History:  SARCOIDOSIS (ICD-135).........................................................Marland KitchenWert  - FOB with lung biopsy 2005 positive NCG> variably on prednisone since then  Sickle Cell Trait  Asthma  - MDI 90% 01/26/2012   Family History:  Pos asthma, mother  Mother - Sickle Cell Disease, Asthma, CAD s/p MI (prior to 44 yo), s/p CVA (frequent hospitalizations)  Father - passed in 30 (44 yo) from Prostate Cancer  4 siblings  Sister - multiple medical problems - details unk  Brother - ? Cancer  Brother - died 2/2 drug abuse   Social History:  Married. Lives with wife [name] and kids. 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 Quit smoking 1995. Drinks 1 bottle Brandy every other day. No drugs or hx of IVDU. Does lots of walking with job, but no regular exercise.           Objective:   Physical Exam Ambulatory healthy bm appearing in no acute distress.  wt 142 > 143 Feb 08, 2009 > 141 March 04, 2010  > 147 07/23/2011 > 150 10/01/2011 > 01/26/2012  158 HEENT: nl dentition, turbinates, and orophanx. Nl external ear canals without cough reflex  Neck without JVD/Nodes/TM  Lungs bilateral end  exp wheezes and  mod increase exp time  RRR no s3 or murmur or increase in P2  Abd soft and benign with nl excursion in the supine position. No bruits or organomegaly  Ext warm without calf tenderness, cyanosis clubbing or edema  Skin warm and dry without lesions              Assessment & Plan:

## 2012-01-27 ENCOUNTER — Encounter: Payer: Self-pay | Admitting: Internal Medicine

## 2012-01-28 ENCOUNTER — Encounter: Payer: Self-pay | Admitting: Internal Medicine

## 2012-02-02 ENCOUNTER — Ambulatory Visit: Payer: BC Managed Care – PPO | Admitting: Internal Medicine

## 2012-03-03 ENCOUNTER — Encounter (HOSPITAL_COMMUNITY): Payer: Self-pay | Admitting: *Deleted

## 2012-03-03 ENCOUNTER — Emergency Department (HOSPITAL_COMMUNITY)
Admission: EM | Admit: 2012-03-03 | Discharge: 2012-03-03 | Disposition: A | Discharge status: Home / Self Care | Payer: Worker's Compensation | Attending: Emergency Medicine | Admitting: Emergency Medicine

## 2012-03-03 DIAGNOSIS — R21 Rash and other nonspecific skin eruption: Secondary | ICD-10-CM | POA: Insufficient documentation

## 2012-03-03 DIAGNOSIS — Z79899 Other long term (current) drug therapy: Secondary | ICD-10-CM | POA: Insufficient documentation

## 2012-03-03 DIAGNOSIS — I891 Lymphangitis: Secondary | ICD-10-CM

## 2012-03-03 DIAGNOSIS — IMO0001 Reserved for inherently not codable concepts without codable children: Secondary | ICD-10-CM | POA: Insufficient documentation

## 2012-03-03 DIAGNOSIS — W57XXXA Bitten or stung by nonvenomous insect and other nonvenomous arthropods, initial encounter: Secondary | ICD-10-CM

## 2012-03-03 MED ORDER — METHYLPREDNISOLONE SODIUM SUCC 125 MG IJ SOLR
125.0000 mg | Freq: Once | INTRAMUSCULAR | Status: AC
  Administered 2012-03-03: 125 mg via INTRAVENOUS
  Filled 2012-03-03: qty 2

## 2012-03-03 MED ORDER — IPRATROPIUM BROMIDE 0.02 % IN SOLN
0.5000 mg | Freq: Once | RESPIRATORY_TRACT | Status: AC
  Administered 2012-03-03: 0.5 mg via RESPIRATORY_TRACT
  Filled 2012-03-03: qty 2.5

## 2012-03-03 MED ORDER — ALBUTEROL SULFATE (5 MG/ML) 0.5% IN NEBU
5.0000 mg | INHALATION_SOLUTION | Freq: Once | RESPIRATORY_TRACT | Status: AC
  Administered 2012-03-03: 5 mg via RESPIRATORY_TRACT
  Filled 2012-03-03: qty 1

## 2012-03-03 MED ORDER — PREDNISONE 20 MG PO TABS: 60.0000 mg | ORAL_TABLET | Freq: Every day | ORAL | Status: AC

## 2012-03-03 NOTE — ED Provider Notes (Signed)
History     CSN: [id number]  Arrival date & time 03/03/12  1145   First MD Initiated Contact with Patient 03/03/12 1200      Chief Complaint  Patient presents with  . Insect Bite    RT arm    (Consider location/radiation/quality/duration/timing/severity/associated sxs/prior treatment) The history is provided by the patient.  The pt is a 23 y male with hx of asthma and bee allergy. He says he was bitten by an insect this am.  Now he has pain in his right forearm, swelling with redness extending up his right arm, wheezing.  He DENIES throat tightness, light headedness or sob.  He was not wheezing before he was stung.   Level 5 caveat for urgent need for intervention.  Wheezing after insect bite/sting.  Past Medical History  Diagnosis Date  . Asthma   . Sarcoidosis   . Sickle cell trait   . Sickle cell anemia   . Sarcoidosis     Past Surgical History  Procedure Date  . Shoulder open rotator cuff repair 2007  . I&d extremity 01/19/2012    Procedure: IRRIGATION AND DEBRIDEMENT EXTREMITY;  Surgeon: Nadara Mustard, MD;  Location: [facility] OR;  Service: Orthopedics;  Laterality: Right;    Family History  Problem Relation Age of Onset  . Asthma Mother   . Coronary artery disease Mother   . Stroke Mother   . Heart attack Mother   . Sickle cell trait Mother   . Prostate cancer Father   . Cancer Brother     ? type  . Drug abuse Brother     History  Substance Use Topics  . Smoking status: Former Smoker -- 0.5 packs/day for 10 years    Quit date: 10/04/1993  . Smokeless tobacco: Never Used  . Alcohol Use: 1.5 oz/week    3 drink(s) per week      Review of Systems  HENT: Negative for trouble swallowing and voice change.   Respiratory: Positive for wheezing. Negative for apnea, cough, chest tightness, shortness of breath and stridor.   Gastrointestinal: Negative for nausea, vomiting and abdominal pain.  Skin: Positive for color change and rash.  Neurological: Negative for  light-headedness and headaches.  All other systems reviewed and are negative.    Allergies  Review of patient's allergies indicates no known allergies.  Home Medications   Current Outpatient Rx  Name Route Sig Dispense Refill  . IBUPROFEN 600 MG PO TABS Oral Take 600 mg by mouth every 6 (six) hours as needed. For pain    . LEVALBUTEROL TARTRATE 45 MCG/ACT IN AERO Inhalation Inhale 2 puffs into the lungs every 4 (four) hours as needed. For shortness of breath    . MOMETASONE FURO-FORMOTEROL FUM 200-5 MCG/ACT IN AERO Inhalation Inhale 2 puffs into the lungs 2 (two) times daily. 1 Inhaler 11    BP 141/99  Pulse 97  Temp(Src) 98.3 F (36.8 C) (Oral)  Resp 18  SpO2 98%  Physical Exam  Nursing note and vitals reviewed. Constitutional: He is oriented to person, place, and time. He appears well-developed and well-nourished. No distress.  HENT:  Head: Normocephalic and atraumatic.  Mouth/Throat: Oropharynx is clear and moist.       No OP edema No uvula edema  Eyes: Conjunctivae and EOM are normal.  Neck: Normal range of motion. Neck supple.  Cardiovascular: Normal rate.   No murmur heard. Pulmonary/Chest: Effort normal. No respiratory distress. He has wheezes.  Abdominal: He exhibits no distension.  Musculoskeletal: Normal range of motion. He exhibits edema and tenderness.       Right arm Ulnar forearm.  Approx. 4 X6 cm erythematous and edematous area with 4 mm pustule.   Mild ttp. Erythematous streak extends up to axilla.    Neurological: He is alert and oriented to person, place, and time.  Skin: Skin is warm and dry. There is erythema.  Psychiatric: He has a normal mood and affect. Thought content normal.    ED Course  Procedures (including critical care time) Insect bite with edema, lymphangitis, and wheezing. No hypotension. No resp distress.  Not hypoxic.   Labs Reviewed - No data to display No results found.   No diagnosis found.  2:08 PM Feels better. OP  still no edema.  Lungs cta, wheezing resolved.  Swelling and lymphangitis in right arm still present;  MDM  Insect bite No anaphylaxis OP clear. No wheezing after tx in ed.  No resp distress.  No hypotension        Cheri Guppy, MD 03/03/12 1410

## 2012-03-03 NOTE — Discharge Instructions (Signed)
Use ibuprofen 600 mg every 6 hours to reduce swelling. Use ice to reduce swelling as well.   Use steroids to reduce swelling and prevent recurrence of your symptoms.   Follow up with your doctor as needed. Return for worse symptoms, throat tightness, shortness of breath or light headedness or fainting.

## 2012-03-03 NOTE — ED Notes (Addendum)
Bite to RT arm-unknown culprit.  Swelling and pain.  Pt states a red mark is starting to move up arm.  Pt is wheezing and states it is getting worse.

## 2012-03-03 NOTE — ED Notes (Signed)
Pt reports while at work noticed swelling and pain to right forearm, thinks he got bit by something. Streaking noted to right arm with redness and swelling with blister noted. Inspiratory and expiratory wheezing noted to bilateral lung fields reports hx of asthma with exacerbation today.

## 2012-03-15 ENCOUNTER — Telehealth: Payer: Self-pay | Admitting: Internal Medicine

## 2012-03-15 ENCOUNTER — Ambulatory Visit: Payer: BC Managed Care – PPO | Admitting: Internal Medicine

## 2012-03-15 MED ORDER — LEVALBUTEROL TARTRATE 45 MCG/ACT IN AERO: 2.0000 | INHALATION_SPRAY | RESPIRATORY_TRACT | Status: DC | PRN

## 2012-03-15 NOTE — Telephone Encounter (Signed)
Called, spoke with pt.  States she had an OV scheduled with Dr. Sherene Sires for today but had to cancel this d/t a family emergency and will be leaving to go out of town for this.  He is requesting a sample of dulera 200 and xopenex hfa to take with him.  I have placed 1 sample of dulera at the front for pick up but no xopenex samples available at this time.  Pt is aware of this and would like xopenex hfa rx sent to [facility].  This has been done.  Pt is aware.  Asked to pls keep f/u on April 07, 2012.  He verbalized understanding and voiced no further questions/concerns at this time.  Dulera 200/5 Lot # [id number], Exp Date Jan 2015

## 2012-04-07 ENCOUNTER — Encounter: Payer: Self-pay | Admitting: *Deleted

## 2012-04-07 ENCOUNTER — Encounter: Payer: Self-pay | Admitting: Internal Medicine

## 2012-04-07 ENCOUNTER — Ambulatory Visit (INDEPENDENT_AMBULATORY_CARE_PROVIDER_SITE_OTHER): Payer: BC Managed Care – PPO | Admitting: Internal Medicine

## 2012-04-07 ENCOUNTER — Ambulatory Visit (INDEPENDENT_AMBULATORY_CARE_PROVIDER_SITE_OTHER)
Admission: RE | Admit: 2012-04-07 | Discharge: 2012-04-07 | Disposition: A | Discharge status: Home / Self Care | Payer: BC Managed Care – PPO | Source: Ambulatory Visit | Attending: Internal Medicine | Admitting: Internal Medicine

## 2012-04-07 VITALS — BP 138/72 | HR 93 | Temp 98.0°F | Ht 69.0 in | Wt 151.6 lb

## 2012-04-07 DIAGNOSIS — D869 Sarcoidosis, unspecified: Secondary | ICD-10-CM

## 2012-04-07 DIAGNOSIS — J45909 Unspecified asthma, uncomplicated: Secondary | ICD-10-CM

## 2012-04-07 MED ORDER — PREDNISONE (PAK) 10 MG PO TABS: ORAL_TABLET | ORAL | Status: AC

## 2012-04-07 NOTE — Assessment & Plan Note (Addendum)
- -   FOB with lung biopsy 2005 positive NCG - on and off chronic prednisone until 09/2011 tapered off  No evidence of clinical or radiographic progression off systemic steroids  Will dose x 6 days just to see if helps airway inflammation, sarcoid related or otherwise.

## 2012-04-07 NOTE — Assessment & Plan Note (Signed)
-   hfa 90% 07/23/2011 > confirmed 10/01/2011     - PFT's 01/26/2012 FEV1  1.46 (39%) ratio 49 with 31% p B2, DLCO100%  DDX of  difficult airways managment all start with A and  include Adherence, Ace Inhibitors, Acid Reflux, Active Sinus Disease, Alpha 1 Antitripsin deficiency, Anxiety masquerading as Airways dz,  ABPA,  allergy(esp in young), Aspiration (esp in elderly), Adverse effects of DPI,  Active smokers, plus two Bs  = Bronchiectasis and Beta blocker use..and one C= CHF  Adherence is always the initial "prime suspect" and is a multilayered concern that requires a "trust but verify" approach in every patient - starting with knowing how to use medications, especially inhalers, correctly, keeping up with refills and understanding the fundamental difference between maintenance and prns vs those medications only taken for a very short course and then stopped and not refilled. The proper method of use, as well as anticipated side effects, of a metered-dose inhaler are discussed and demonstrated to the patient. Improved effectiveness after extensive coaching during this visit to a level of approximately  90%  Not clear he's really compliant with dulera 200 so will do a trust but verify approach then consider adding tudorza next ov as this may be more of a fixed asthma component than I had previously appreciated  ? Acid reflux > also consider adding pepcid 20 mg at hs.

## 2012-04-07 NOTE — Patient Instructions (Signed)
Prednisone 10 mg take  4 each am x 2 days,   2 each am x 2 days,  1 each am x 2 days and stop  Please remember to go to the  x-ray department downstairs for your tests - we will call you with the results when they are available.     Please schedule a follow up office visit in 4 weeks, sooner if needed with all inhalers in hand and don't fill any more prescriptions in meantime.

## 2012-04-07 NOTE — Progress Notes (Signed)
Subjective:    Patient ID: [name], male    DOB: 1966-10-01     MRN: [id number]  HPI  71 yobm former smoker quit around 1995,  dx with sarcoid 05/2004  by tbbx on prednisone off and on since then with inconsistent adherence documented.   December 13, 2008--Last seen 3/09- did not return for follow up. Since last viist. Tapered off prednisone, he has taken on/off x one year. Would take when he was dyspneic, wheezing/ and cough. Over last 2 weeks increased cough, tightness, dyspnea, and wheezing. Almost out of prednisone. --CXR hyperinflation of the lungs with enlarged hilar tissue  Minimally increased.  ACE at 57. Predisone restarted at 20mg  once daily.  December 18, 2008-- Returns compalining that breathing no better. still has increased SOB since last OV, wheezing, prod cough with white sputum - worse since last OV. Pt misunderstood instructions, only on prednisone 10mg  once daily. Rec 20 mg per day   January 02, 2009 still on 20 mg per day and finally turning the corner with less sob and cough. rec Prednisone 10 mg 2 each am until 100% better, then 1 daily x 2weeks then one half daily until seen   March 04, 2010 SOB/Wheezing/.. cc working with chemicals and SOB has been worsened....patient states he just found out he has been exposed to asbestos for last 5 yrs.   rec prednisone to 20 mg per day x 2 weeks, minimally better.   rec add dulera 200, taper pred to 5 mg if tolerates, f/u 4 weeks > lost insurance, did not return   05/28/2011 f/u ov/[name] cc much worse since ran out of prednisone x 2 weeks ago had taperd down to 10 mg per day. Thinks dulera helped while on it but also ran out of it over a year ago and had just been relying on the prednisone.  Main problems are minimally prod cough and sob with minimal adls and noct wheeze with R > L ant chest tightness. Called in prednisone, could not afford to fill rx. No nausea, ocular or articular co's or rash. rec Dulera 200 Take 2 puffs first thing in am  and then another 2 puffs about 12 hours later.  Work on inhaler technique: .    Prednisone tablets 10mg   2 daily until 100% then 1 daily x 1 week then 1/2 daily x 1 week then try stopping (only going to work if stay on dulera 200).  07/23/2011 f/u ov/[name] cc 100% while on prednisone and dulera 200 but not consistent and worse p finished prednisone x  Days before ov / has also run out of albuterol.  No purulent  sputum, ocular or articular symptoms rec Remember the dulera Take 2 puffs first thing in am and then another 2 puffs about 12 hours later.  Only use your xopenex sample as a rescue medication    10/01/2011 ov/[name] off prednisone since Oct 2012 again ran out of both inhlaers before ov> worse sob since, minimal cough.   Main cc is indolent onset x sev weeks  of  worsening R shoulder pain radiating down to his forearm assoc with tingling and numbness in all 5 fingers. No neck pain, no fever. Has f/u with his othopedic doctor already scheduled.  Denies trauma. rec Remember the dulera Take 2 puffs first thing in am and then another 2 puffs about 12 hours later.  Prednisone 10 mg take  4 each am x 2 days,   2 each am x 2 days,  1 each am x2days and stop  Only use your xopenex sample as a rescue medication    01/26/2012 f/u ov/[name] cc doe x chasing kids, using xopenox every night but none during the day, returns for pft's with outdated dulera 200 down to zero, not clear from records he's filled any precriptions. No purulent sputum, ocular or articular symptoms or rash. rec dulera 200 Take 2 puffs first thing in am and then another 2 puffs about 12 hours later.  office visit in 6 weeks, call sooner if needed - bring all inhalers with you   04/07/2012 f/u ov/[name] still on dulera 200 bid with second dose 8 pm and wakes up 2-4 am take 1 xopenex and goes back. Unable to cut grass 7/4 due to sob. No purulent sputum or sinus/ hb symptoms.  Also denies any obvious fluctuation of symptoms with weather  or environmental changes or other aggravating or alleviating factors except as outlined above   ROS  At present neg for  any significant sore throat, dysphagia, dental problems, itching, sneezing,  nasal congestion or excess/ purulent secretions, ear ache,   fever, chills, sweats, unintended wt loss, pleuritic or exertional cp, hemoptysis,  orthopnea pnd or leg swelling.  Also denies presyncope, palpitations, heartburn, abdominal pain, anorexia, nausea, vomiting, diarrhea  or change in bowel or urinary habits, change in stools or urine, dysuria,hematuria,  rash, arthralgias, visual complaints, headache, numbness weakness or ataxia or problems with walking or coordination. No noted change in mood/affect or memory.          Past Medical History:  SARCOIDOSIS (ICD-135).........................................................Marland KitchenWert  - FOB with lung biopsy 2005 positive NCG   Sickle Cell Trait  Asthma  - MDI 90% 01/26/2012   Family History:  Pos asthma, mother  Mother - Sickle Cell Disease, Asthma, CAD s/p MI (prior to 44 yo), s/p CVA (frequent hospitalizations)  Father - passed in 36 (5 yo) from Prostate Cancer  4 siblings  Sister - multiple medical problems - details unk  Brother - ? Cancer  Brother - died 2/2 drug abuse   Social History:  Married. Lives with wife [name] and kids. 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 Quit smoking 1995. Drinks 1 bottle Brandy every other day. No drugs or hx of IVDU. Does lots of walking with job, but no regular exercise.           Objective:   Physical Exam Ambulatory healthy bm appearing in no acute distress.  wt   143 Feb 08, 2009 > 141 March 04, 2010  > 147 07/23/2011 >> 01/26/2012  158 > 04/07/2012  151 HEENT: nl dentition, turbinates, and orophanx. Nl external ear canals without cough reflex  Neck without JVD/Nodes/TM  Lungs bilateral end  exp wheezes and mod increase exp time  RRR no s3 or murmur or  increase in P2  Abd soft and benign with nl excursion in the supine position. No bruits or organomegaly  Ext warm without calf tenderness, cyanosis clubbing or edema  Skin warm and dry without lesions     CXR  04/07/2012 :    History given of sarcoidosis. There is a mild hyperinflation  configuration. History given of asthma. No acute superimposed  pulmonary or pleural abnormalities are identified. Stable  appearance of chest          Assessment & Plan:

## 2012-04-10 NOTE — Progress Notes (Signed)
Quick Note:  Called telephone number in patients chart, no option to leave voicemail. WCB ______

## 2012-04-12 NOTE — Progress Notes (Signed)
Quick Note:  ATC pt, still no answer and no option to leave msg. ______

## 2012-05-08 ENCOUNTER — Encounter: Payer: Self-pay | Admitting: Internal Medicine

## 2012-05-08 NOTE — Progress Notes (Signed)
 This encounter was created in error - please disregard.

## 2012-05-19 ENCOUNTER — Encounter: Payer: Self-pay | Admitting: Internal Medicine

## 2012-05-19 NOTE — Progress Notes (Signed)
 This encounter was created in error - please disregard.

## 2012-10-30 ENCOUNTER — Telehealth: Payer: Self-pay | Admitting: Internal Medicine

## 2012-10-30 MED ORDER — MOMETASONE FURO-FORMOTEROL FUM 200-5 MCG/ACT IN AERO: 2.0000 | INHALATION_SPRAY | Freq: Two times a day (BID) | RESPIRATORY_TRACT | Status: DC

## 2012-10-30 NOTE — Telephone Encounter (Signed)
Spoke with patient, patient requesting sample of Dulera be placed upfront for pick up.  Patient states he will not get paid until Friday and needs an inhaler now.  Patient aware inhaler is upfront for pickup.  I have also attempt to schedule patient for f/u with Dr. Sherene Sires. Patient states he will call back to schedule appt.  Nothing further needed at this time.

## 2013-01-26 ENCOUNTER — Telehealth: Payer: Self-pay | Admitting: Internal Medicine

## 2013-01-26 MED ORDER — MOMETASONE FURO-FORMOTEROL FUM 200-5 MCG/ACT IN AERO: 2.0000 | INHALATION_SPRAY | Freq: Two times a day (BID) | RESPIRATORY_TRACT | Status: DC

## 2013-01-26 MED ORDER — ALBUTEROL SULFATE HFA 108 (90 BASE) MCG/ACT IN AERS: 2.0000 | INHALATION_SPRAY | Freq: Four times a day (QID) | RESPIRATORY_TRACT | Status: DC | PRN

## 2013-01-26 NOTE — Telephone Encounter (Signed)
Pt aware sample of dulera and ventolin left upfront for p/u. He isaware he must keep appt for further samples. Nothing further was needed

## 2013-02-09 ENCOUNTER — Ambulatory Visit (INDEPENDENT_AMBULATORY_CARE_PROVIDER_SITE_OTHER): Payer: BC Managed Care – PPO | Admitting: Internal Medicine

## 2013-02-09 ENCOUNTER — Encounter: Payer: Self-pay | Admitting: *Deleted

## 2013-02-09 ENCOUNTER — Encounter: Payer: Self-pay | Admitting: Internal Medicine

## 2013-02-09 VITALS — BP 118/64 | HR 90 | Temp 97.3°F | Ht 69.0 in | Wt 158.4 lb

## 2013-02-09 DIAGNOSIS — J45909 Unspecified asthma, uncomplicated: Secondary | ICD-10-CM

## 2013-02-09 MED ORDER — ALBUTEROL SULFATE HFA 108 (90 BASE) MCG/ACT IN AERS: 2.0000 | INHALATION_SPRAY | Freq: Four times a day (QID) | RESPIRATORY_TRACT | Status: DC | PRN

## 2013-02-09 MED ORDER — PREDNISONE (PAK) 10 MG PO TABS: ORAL_TABLET | ORAL | Status: DC

## 2013-02-09 NOTE — Patient Instructions (Addendum)
Prednisone 10 mg take  4 each am x 2 days,   2 each am x 2 days,  1 each am x2days and stop   Plan A = Automatically Dulera 200 Take 2 puffs first thing in am and then another 2 puffs about 12 hours later.   Only use your albuterol (Plan B ventolin or proaire) as a rescue medication to be used if you can't catch your breath by resting or doing a relaxed purse lip breathing pattern. The less you use it, the better it will work when you need it.  Goal is less than twice a week but ok to use up to 2 puffs every 4h if needed   Please schedule a follow up office visit in 4 weeks, sooner if needed  Given 2 dulera 60's to last 4 weeks

## 2013-02-09 NOTE — Progress Notes (Signed)
Subjective:    Patient ID: [name], male    DOB: 1966-10-01     MRN: [id number]  HPI  71 yobm former smoker quit around 1995,  dx with sarcoid 05/2004  by tbbx on prednisone off and on since then with inconsistent adherence documented.   December 13, 2008--Last seen 3/09- did not return for follow up. Since last viist. Tapered off prednisone, he has taken on/off x one year. Would take when he was dyspneic, wheezing/ and cough. Over last 2 weeks increased cough, tightness, dyspnea, and wheezing. Almost out of prednisone. --CXR hyperinflation of the lungs with enlarged hilar tissue  Minimally increased.  ACE at 57. Predisone restarted at 20mg  once daily.  December 18, 2008-- Returns compalining that breathing no better. still has increased SOB since last OV, wheezing, prod cough with white sputum - worse since last OV. Pt misunderstood instructions, only on prednisone 10mg  once daily. Rec 20 mg per day   January 02, 2009 still on 20 mg per day and finally turning the corner with less sob and cough. rec Prednisone 10 mg 2 each am until 100% better, then 1 daily x 2weeks then one half daily until seen   March 04, 2010 SOB/Wheezing/.. cc working with chemicals and SOB has been worsened....patient states he just found out he has been exposed to asbestos for last 5 yrs.   rec prednisone to 20 mg per day x 2 weeks, minimally better.   rec add dulera 200, taper pred to 5 mg if tolerates, f/u 4 weeks > lost insurance, did not return   05/28/2011 f/u ov/Anthone Prieur cc much worse since ran out of prednisone x 2 weeks ago had taperd down to 10 mg per day. Thinks dulera helped while on it but also ran out of it over a year ago and had just been relying on the prednisone.  Main problems are minimally prod cough and sob with minimal adls and noct wheeze with R > L ant chest tightness. Called in prednisone, could not afford to fill rx. No nausea, ocular or articular co's or rash. rec Dulera 200 Take 2 puffs first thing in am  and then another 2 puffs about 12 hours later.  Work on inhaler technique: .    Prednisone tablets 10mg   2 daily until 100% then 1 daily x 1 week then 1/2 daily x 1 week then try stopping (only going to work if stay on dulera 200).  07/23/2011 f/u ov/Mariona Scholes cc 100% while on prednisone and dulera 200 but not consistent and worse p finished prednisone x  Days before ov / has also run out of albuterol.  No purulent  sputum, ocular or articular symptoms rec Remember the dulera Take 2 puffs first thing in am and then another 2 puffs about 12 hours later.  Only use your xopenex sample as a rescue medication    10/01/2011 ov/Mija Effertz off prednisone since Oct 2012 again ran out of both inhlaers before ov> worse sob since, minimal cough.   Main cc is indolent onset x sev weeks  of  worsening R shoulder pain radiating down to his forearm assoc with tingling and numbness in all 5 fingers. No neck pain, no fever. Has f/u with his othopedic doctor already scheduled.  Denies trauma. rec Remember the dulera Take 2 puffs first thing in am and then another 2 puffs about 12 hours later.  Prednisone 10 mg take  4 each am x 2 days,   2 each am x 2 days,  1 each am x2days and stop  Only use your xopenex sample as a rescue medication    01/26/2012 f/u ov/Aspyn Warnke cc doe x chasing kids, using xopenox every night but none during the day, returns for pft's with outdated dulera 200 down to zero, not clear from records he's filled any precriptions. No purulent sputum, ocular or articular symptoms or rash. rec dulera 200 Take 2 puffs first thing in am and then another 2 puffs about 12 hours later.  office visit in 6 weeks, call sooner if needed - bring all inhalers with you   04/07/2012 f/u ov/Kechia Yahnke still on dulera 200 bid with second dose 8 pm and wakes up 2-4 am take 1 xopenex and goes back. Unable to cut grass 7/4 due to sob. No purulent sputum or sinus/ hb symptoms. rec Prednisone 10 mg take  4 each am x 2 days,   2 each am x 2  days,  1 each am x 2 days and stop Please schedule a follow up office visit in 4 weeks, sooner if needed with all inhalers in hand and don't fill any more prescriptions in meantime  05/08/2012 f/u ov/Dong Nimmons cc missed appt  05/19/2012 f/u ov/Lynessa Almanzar cc missed appt  02/09/2013 f/u ov/Cheyne Bungert  Chief Complaint  Patient presents with  . Follow-up    Pt c/o increased SOB, wheezing, and chest tightness x 3-4 wks. Using rescue inhaler several times per day with little relief. Waking up at least 4 times per night with wheezing and SOB.   worse x one month using rescue once every other day at baseline But states even then uses extra dulera daytime and dulera counts / refills don't add up to adherence, much worse when runs out of dulera.  No obvious daytime variabilty or assoc chronic cough or cp or chest tightness, subjective wheeze overt sinus or hb symptoms. No unusual exp hx or h/o childhood pna/ asthma or premature birth to his knowledge.   Sleeping ok without nocturnal  or early am exacerbation  of respiratory  c/o's or need for noct saba. Also denies any obvious fluctuation of symptoms with weather or environmental changes or other aggravating or alleviating factors except as outlined above  Current Medications, Allergies, Past Medical History, Past Surgical History, Family History, and Social History were reviewed in [employer] record.  ROS  The following are not active complaints unless bolded sore throat, dysphagia, dental problems, itching, sneezing,  nasal congestion or excess/ purulent secretions, ear ache,   fever, chills, sweats, unintended wt loss, pleuritic or exertional cp, hemoptysis,  orthopnea pnd or leg swelling, presyncope, palpitations, heartburn, abdominal pain, anorexia, nausea, vomiting, diarrhea  or change in bowel or urinary habits, change in stools or urine, dysuria,hematuria,  rash, arthralgias, visual complaints, headache, numbness weakness or ataxia or problems  with walking or coordination,  change in mood/affect or memory.                Past Medical History:  SARCOIDOSIS (ICD-135).........................................................Marland KitchenWert  - FOB with lung biopsy 2005 positive NCG   Sickle Cell Trait  Asthma      Family History:  Pos asthma, mother  Mother - Sickle Cell Disease, Asthma, CAD s/p MI (prior to 45 yo), s/p CVA (frequent hospitalizations)  Father - passed in 4 (24 yo) from Prostate Cancer  4 siblings  Sister - multiple medical problems - details unk  Brother - ? Cancer  Brother - died 2/2 drug abuse   Social History:  Married. Lives  with wife [name] and kids. 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 Quit smoking 1995. Drinks 1 bottle Brandy every other day. No drugs or hx of IVDU. Does lots of walking with job, but no regular exercise.           Objective:   Physical Exam Ambulatory healthy bm appearing mild increased wob better p neb xopenex.  wt   143 Feb 08, 2009 > 141 March 04, 2010  >   01/26/2012  158 > 04/07/2012  151>  158 02/09/2013  HEENT: nl dentition, turbinates, and orophanx. Nl external ear canals without cough reflex  Neck without JVD/Nodes/TM  Lungs bilateral early exp wheezes and mod increase exp time  RRR no s3 or murmur or increase in P2  Abd soft and benign with nl excursion in the supine position. No bruits or organomegaly  Ext warm without calf tenderness, cyanosis clubbing or edema  Skin warm and dry without lesions     CXR  04/07/2012 :   History given of sarcoidosis. There is a mild hyperinflation  configuration. History given of asthma. No acute superimposed  pulmonary or pleural abnormalities are identified. Stable  appearance of chest          Assessment & Plan:

## 2013-02-10 NOTE — Assessment & Plan Note (Signed)
-   hfa 75% 02/09/2013     - PFT's 01/26/2012 FEV1  1.46 (39%) ratio 49 with 31% p B2, DLCO100%  Better p xopenex 1.25 mg but he has clearly difficult to control asthma  DDX of  difficult airways managment all start with A and  include Adherence, Ace Inhibitors, Acid Reflux, Active Sinus Disease, Alpha 1 Antitripsin deficiency, Anxiety masquerading as Airways dz,  ABPA,  allergy(esp in young), Aspiration (esp in elderly), Adverse effects of DPI,  Active smokers, plus two Bs  = Bronchiectasis and Beta blocker use..and one C= CHF   Adherence is always the initial "prime suspect" and is a multilayered concern that requires a "trust but verify" approach in every patient - starting with knowing how to use medications, especially inhalers, correctly, keeping up with refills and understanding the fundamental difference between maintenance and prns vs those medications only taken for a very short course and then stopped and not refilled. The proper method of use, as well as anticipated side effects, of a metered-dose inhaler are discussed and demonstrated to the patient. Improved effectiveness after extensive coaching during this visit to a level of approximately  75%  But needs to get to 100%  See instructions for specific recommendations which were reviewed directly with the patient who was given a copy with highlighter outlining the key components.

## 2013-03-09 ENCOUNTER — Encounter: Payer: Self-pay | Admitting: Internal Medicine

## 2013-03-09 ENCOUNTER — Ambulatory Visit (INDEPENDENT_AMBULATORY_CARE_PROVIDER_SITE_OTHER): Payer: BC Managed Care – PPO | Admitting: Internal Medicine

## 2013-03-09 ENCOUNTER — Encounter: Payer: Self-pay | Admitting: *Deleted

## 2013-03-09 VITALS — BP 124/80 | HR 89 | Temp 97.6°F | Ht 67.0 in | Wt 154.0 lb

## 2013-03-09 DIAGNOSIS — J45909 Unspecified asthma, uncomplicated: Secondary | ICD-10-CM

## 2013-03-09 MED ORDER — MOMETASONE FURO-FORMOTEROL FUM 200-5 MCG/ACT IN AERO: 2.0000 | INHALATION_SPRAY | Freq: Two times a day (BID) | RESPIRATORY_TRACT | Status: DC

## 2013-03-09 MED ORDER — LEVALBUTEROL TARTRATE 45 MCG/ACT IN AERO: 2.0000 | INHALATION_SPRAY | RESPIRATORY_TRACT | Status: DC | PRN

## 2013-03-09 NOTE — Progress Notes (Signed)
Subjective:    Patient ID: [name], male    DOB: Aug 12, 1966     MRN: [id number]  HPI  47 yobm former smoker quit around 1995,  dx with sarcoid 05/2004  by tbbx on prednisone off and on since then with inconsistent adherence documented.   December 13, 2008--Last seen 3/09- did not return for follow up. Since last viist. Tapered off prednisone, he has taken on/off x one year. Would take when he was dyspneic, wheezing/ and cough. Over last 2 weeks increased cough, tightness, dyspnea, and wheezing. Almost out of prednisone. --CXR hyperinflation of the lungs with enlarged hilar tissue  Minimally increased.  ACE at 57. Predisone restarted at  once daily.  December 18, 2008-- Returns compalining that breathing no better. still has increased SOB since last OV, wheezing, prod cough with white sputum - worse since last OV. Pt misunderstood instructions, only on prednisone  once daily. Rec 20 mg per day   January 02, 2009 still on 20 mg per day and finally turning the corner with less sob and cough. rec Prednisone 10 mg 2 each am until 100% better, then 1 daily x 2weeks then one half daily until seen   March 04, 2010 SOB/Wheezing/.. cc working with chemicals and SOB has been worsened....patient states he just found out he has been exposed to asbestos for last 5 yrs.   rec prednisone to 20 mg per day x 2 weeks, minimally better.   rec add dulera 200, taper pred to 5 mg if tolerates, f/u 4 weeks > lost insurance, did not return   05/28/2011 f/u ov/[name] cc much worse since ran out of prednisone x 2 weeks ago had taperd down to 10 mg per day. Thinks dulera helped while on it but also ran out of it over a year ago and had just been relying on the prednisone.  Main problems are minimally prod cough and sob with minimal adls and noct wheeze with R > L ant chest tightness. Called in prednisone, could not afford to fill rx. No nausea, ocular or articular co's or rash. rec Dulera 200 Take 2 puffs first thing in am  and then another 2 puffs about 12 hours later.  Work on inhaler technique: .    Prednisone tablets   2 daily until 100% then 1 daily x 1 week then 1/2 daily x 1 week then try stopping (only going to work if stay on dulera 200).  07/23/2011 f/u ov/[name] cc 100% while on prednisone and dulera 200 but not consistent and worse p finished prednisone x  Days before ov / has also run out of albuterol.  No purulent  sputum, ocular or articular symptoms rec Remember the dulera Take 2 puffs first thing in am and then another 2 puffs about 12 hours later.  Only use your xopenex sample as a rescue medication    10/01/2011 ov/[name] off prednisone since Oct 2012 again ran out of both inhlaers before ov> worse sob since, minimal cough.   Main cc is indolent onset x sev weeks  of  worsening R shoulder pain radiating down to his forearm assoc with tingling and numbness in all 5 fingers. No neck pain, no fever. Has f/u with his othopedic doctor already scheduled.  Denies trauma. rec Remember the dulera Take 2 puffs first thing in am and then another 2 puffs about 12 hours later.  Prednisone 10 mg take  4 each am x 2 days,   2 each am x 2 days,  1 each am x2days and stop  Only use your xopenex sample as a rescue medication    01/26/2012 f/u ov/[name] cc doe x chasing kids, using xopenox every night but none during the day, returns for pft's with outdated dulera 200 down to zero, not clear from records he's filled any precriptions. No purulent sputum, ocular or articular symptoms or rash. rec dulera 200 Take 2 puffs first thing in am and then another 2 puffs about 12 hours later.  office visit in 6 weeks, call sooner if needed - bring all inhalers with you   04/07/2012 f/u ov/[name] still on dulera 200 bid with second dose 8 pm and wakes up 2-4 am take 1 xopenex and goes back. Unable to cut grass 7/4 due to sob. No purulent sputum or sinus/ hb symptoms. rec Prednisone 10 mg take  4 each am x 2 days,   2 each am x 2  days,  1 each am x 2 days and stop Please schedule a follow up office visit in 4 weeks, sooner if needed with all inhalers in hand and don't fill any more prescriptions in meantime  05/08/2012 f/u ov/[name] cc missed appt  05/19/2012 f/u ov/[name] cc missed appt  02/09/2013 f/u ov/[name]  Chief Complaint  Patient presents with  . Follow-up    Pt c/o increased SOB, wheezing, and chest tightness x 3-4 wks. Using rescue inhaler several times per day with little relief. Waking up at least 4 times per night with wheezing and SOB.   worse x one month using rescue once every other day at baseline But states even then uses extra dulera daytime and dulera counts / refills don't add up to adherence, much worse when runs out of dulera. rec Prednisone 10 mg take  4 each am x 2 days,   2 each am x 2 days,  1 each am x2days and stop  Plan A = Automatically Dulera 200 Take 2 puffs first thing in am and then another 2 puffs about 12 hours later.  Only use your albuterol (Plan B ventolin or proaire) as a rescue medication   03/09/2013 f/u ov/[name] asthma on dulera 200 2bid and xopenex maybe 2x week, counts add up Chief Complaint  Patient presents with  . Follow-up    Pt states breathing is much improved since last visit. No new co's today.     No obvious daytime variabilty or assoc chronic cough or cp or chest tightness, subjective wheeze overt sinus or hb symptoms. No unusual exp hx or h/o childhood pna/ asthma or premature birth to his knowledge.   Sleeping ok without nocturnal  or early am exacerbation  of respiratory  c/o's or need for noct saba. Also denies any obvious fluctuation of symptoms with weather or environmental changes or other aggravating or alleviating factors except as outlined above  Current Medications, Allergies, Past Medical History, Past Surgical History, Family History, and Social History were reviewed in [employer] record.  ROS  The following are not active  complaints unless bolded sore throat, dysphagia, dental problems, itching, sneezing,  nasal congestion or excess/ purulent secretions, ear ache,   fever, chills, sweats, unintended wt loss, pleuritic or exertional cp, hemoptysis,  orthopnea pnd or leg swelling, presyncope, palpitations, heartburn, abdominal pain, anorexia, nausea, vomiting, diarrhea  or change in bowel or urinary habits, change in stools or urine, dysuria,hematuria,  rash, arthralgias, visual complaints, headache, numbness weakness or ataxia or problems with walking or coordination,  change in mood/affect  or memory.                Past Medical History:  SARCOIDOSIS (ICD-135).........................................................Marland KitchenWert  - FOB with lung biopsy 2005 positive NCG   Sickle Cell Trait  Asthma      Family History:  Pos asthma, mother  Mother - Sickle Cell Disease, Asthma, CAD s/p MI (prior to 45 yo), s/p CVA (frequent hospitalizations)  Father - passed in 75 (78 yo) from Prostate Cancer  4 siblings  Sister - multiple medical problems - details unk  Brother - ? Cancer  Brother - died 2/2 drug abuse   Social History:  Married. Lives with wife [name] and kids. 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 Quit smoking 1995. Drinks 1 bottle Brandy every other day. No drugs or hx of IVDU. Does lots of walking with job, but no regular exercise.           Objective:   Physical Exam Ambulatory healthy bm appearing mild increased wob better p neb xopenex.  wt   143 Feb 08, 2009 > 141 March 04, 2010  >   01/26/2012  158 > 04/07/2012  151>  158 02/09/2013 > 03/10/13 154 HEENT: nl dentition, turbinates, and orophanx. Nl external ear canals without cough reflex  Neck without JVD/Nodes/TM  Lungs completely clear to A and P RRR no s3 or murmur or increase in P2  Abd soft and benign with nl excursion in the supine position. No bruits or organomegaly  Ext warm without calf tenderness,  cyanosis clubbing or edema  Skin warm and dry without lesions     CXR  04/07/2012 :   History given of sarcoidosis. There is a mild hyperinflation  configuration. History given of asthma. No acute superimposed  pulmonary or pleural abnormalities are identified. Stable  appearance of chest          Assessment & Plan:

## 2013-03-09 NOTE — Patient Instructions (Addendum)
Continue dulera 200 Take 2 puffs first thing in am and then another 2 puffs about 12 hours later.   Only use your albuterol(xopenex) as a rescue medication to be used if you can't catch your breath by resting or doing a relaxed purse lip breathing pattern. The less you use it, the better it will work when you need it. Ok to use it up to every 4 hours but goal is less than twice a week. Don't leave home without it  Be sure to let us know if your insurance company prefers alternatives to the above recommendations and I will help you choose one  Please schedule a follow up visit in 3 months but call sooner if needed

## 2013-03-10 NOTE — Assessment & Plan Note (Signed)
-   hfa 90% 03/09/2013     - PFT's 01/26/2012 FEV1  1.46 (39%) ratio 49 with 31% p B2, DLCO100%  All goals of chronic asthma control met including optimal function( though probably not nl)  and elimination of symptoms with minimal need for rescue therapy.  Contingencies discussed in full including contacting this office immediately if not controlling the symptoms using the rule of two's.

## 2013-04-18 ENCOUNTER — Ambulatory Visit (INDEPENDENT_AMBULATORY_CARE_PROVIDER_SITE_OTHER): Payer: BC Managed Care – PPO | Admitting: Family Medicine

## 2013-04-18 VITALS — BP 131/84 | HR 83 | Temp 98.7°F | Ht 69.0 in | Wt 152.1 lb

## 2013-04-18 DIAGNOSIS — F1020 Alcohol dependence, uncomplicated: Secondary | ICD-10-CM

## 2013-04-18 DIAGNOSIS — J45909 Unspecified asthma, uncomplicated: Secondary | ICD-10-CM

## 2013-04-18 DIAGNOSIS — F1010 Alcohol abuse, uncomplicated: Secondary | ICD-10-CM

## 2013-04-18 DIAGNOSIS — D869 Sarcoidosis, unspecified: Secondary | ICD-10-CM

## 2013-04-18 DIAGNOSIS — M6530 Trigger finger, unspecified finger: Secondary | ICD-10-CM

## 2013-04-18 LAB — COMPREHENSIVE METABOLIC PANEL
AST: 23 U/L (ref 0–37)
Alkaline Phosphatase: 46 U/L (ref 39–117)
BUN: 13 mg/dL (ref 6–23)
Glucose, Bld: 102 mg/dL — ABNORMAL HIGH (ref 70–99)
Total Bilirubin: 0.6 mg/dL (ref 0.3–1.2)

## 2013-04-18 LAB — CBC
HCT: 46 % (ref 39.0–52.0)
Hemoglobin: 16.5 g/dL (ref 13.0–17.0)
MCH: 28.7 pg (ref 26.0–34.0)
MCHC: 35.9 g/dL (ref 30.0–36.0)
RDW: 14.8 % (ref 11.5–15.5)

## 2013-04-18 MED ORDER — DICLOFENAC SODIUM 1 % TD GEL: 2.0000 g | Freq: Two times a day (BID) | TRANSDERMAL | Status: DC

## 2013-04-18 NOTE — Assessment & Plan Note (Signed)
F/u by pulmonology.

## 2013-04-18 NOTE — Patient Instructions (Addendum)
Trigger Finger Trigger finger (digital tendinitis and stenosing tenosynovitis) is a common disorder that causes an often painful catching of the fingers or thumb. It occurs as a clicking, snapping or locking of a finger in the palm of the hand. The reason for this is that there is a problem with the tendons which flex the fingers sliding smoothly through their sheaths. The cause of this may be inflammation of the tendon and sheath, or from a thickening or nodule in the tendon. The condition may occur in any finger or a couple fingers at the same time. The cause may be overuse while doing the same activity over and over again with your hands.  Tendons are the tough cords that connect the muscles to bones. Muscles and tendons are part of the system which allows your body to move. When muscles contract in the forearm on the palm side, they pull the tendons toward the elbow and cause the fingers and thumb to bend (flex) toward the palm. These are the flexor tendons. The tendons slide through a slippery smooth membrane (synovium) which is called the tendon sheath. The sheaths have areas of tough fibrous tissues surrounding them which hold the tendons close to the bone. These are called pulleys because they work like a pulley. The first pulley is in the palm of the hand near the crease which runs across your palm. If the area of the tendon thickening is near the pulley, the tendon cannot slide smoothly through the pulley and this causes the trigger finger. The finger may lock with the finger curled or suddenly straighten out with a snap. This is more common in patients with rheumatoid arthritis and diabetes. Left untreated, the condition may get worse to the point where the finger becomes locked in flexion, like making a fist, or less commonly locked with the finger straightened out. DIAGNOSIS  Your caregiver will easily make this diagnosis on examination. TREATMENT   Splinting for 6 to 8 weeks of time may be  helpful. Use the splints as your caregiver suggests.  Heat used for twenty minutes at least four times a day followed by ice packs for twenty minutes unless directed otherwise by your caregiver may be helpful. If you find either heat or cold seems to be making the problem worse, quit using them and ask your caregiver for directions.  Cortisone injections along with splinting may speed up recovery. Several injections may be required. Cortisone may give relief after one injection.  Only take over-the-counter or prescription medicines for pain, discomfort  Surgery is another treatment that may be used if conservative treatments using injection and splinting does not work. Surgery can be minor without incisions (a cut does not have to be made) and can be done with a needle through the skin. No stitches are needed and most patients may return to work the same day.  Other surgical choices involve an open procedure where the surgeon opens the hand through a small incision (cut) and cuts the pulley so the tendon can again slide smoothly. Your hand will still work fine. This small operation requires stitches and the recovery will be a little longer and the incisions will need to be protected until completely healed. You may have to limit your activities for up to 6 months.  Occupational or hand therapy may be required if there is stiffness remaining in the finger. RISKS AND COMPLICATIONS Complications are uncommon but some problems that may occur are:  Recurrence of the trigger finger. This does  not mean that the surgery was not well done. It simply means that you may have formed scar tissue following surgery that causes the problem to reoccur.  Infection which could ruin the results of the surgery and can result in a finger which is frozen and can not move normally.  Nerve injury is possible which could result in permanent numbness of one or more fingers. CARE AFTER SURGERY  Elevate your hand above  your heart and use ice as instructed.  Follow instructions regarding finger motion/exercise.  Keep the surgical wound dry for at least 48 hrs or longer if instructed.  Keep your follow-up appointments.  Return to work and normal activities as instructed. SEEK IMMEDIATE MEDICAL CARE IF:  Your problems are getting worse or you do not obtain relief from the treatment. Document Released: 07/10/2004 Document Revised: 12/13/2011 Document Reviewed: 03/04/2009 [facility] Patient Information 2014 [place], [place].

## 2013-04-18 NOTE — Assessment & Plan Note (Signed)
F/u by Pulmonology. Reports taking Dulera and Ventolin and this seems to control his symptoms.

## 2013-04-18 NOTE — Assessment & Plan Note (Signed)
Drinks 1/2 a pint of Brandy and up to 5 beers daily. Declines resources to quit drinking.

## 2013-04-18 NOTE — Progress Notes (Addendum)
Family Medicine Office Visit Note   Subjective:   Patient ID: [name], male  DOB: [birth year], 47 y.o.. MRN: [id number]   This is my first time seen Mr. [name]. He comes today to re-establish care in our center. He has PMHx of Sarcoidosis and Asthma followed up by Pulmonology. He also reports alcohol abuse and denies desires of changing his habits. He denies the use of recreational drugs or smoking. He reports being a blood donor.   His concern today is about his left hand 3rd finger. He reports waking up in the middle of the night and having to extend his finger with the help of his right hand. Denies pain but states the involuntary flexion and locking of his finger is bothering him. He has had this symptoms for 5-6 months with no apparent relieve.   Review of Systems:  Pt denies SOB, chest pain, palpitations, headaches, dizziness, numbness or weakness. No changes on urinary or BM habits. No unintentional weigh loss/gain.  Objective:   Physical Exam: Gen:  NAD HEENT: Moist mucous membranes  CV: Regular rate and rhythm, no murmurs rubs or gallops PULM: Clear to auscultation bilaterally. No wheezes/rales/rhonchi ABD: Soft, non tender, non distended, normal bowel sounds EXT: No edema or erythema. Left 3rd finger with delay of complete extension after flexion. No masses felt on examination.  Neuro: Alert and oriented x3. No focalization  Assessment & Plan:

## 2013-04-18 NOTE — Assessment & Plan Note (Addendum)
Recommended partial immobilization using splint for 6 weeks. Local Ice/heat F/u if not improvement for SM referral ( steroid injection )

## 2013-04-19 ENCOUNTER — Encounter: Payer: Self-pay | Admitting: Family Medicine

## 2013-04-19 ENCOUNTER — Telehealth: Payer: Self-pay | Admitting: *Deleted

## 2013-04-19 NOTE — Telephone Encounter (Signed)
Received prior authorization form from [facility] for Voltaren gel   Form placed in doctor's box for completion,please complete highlighted areas and fax to number at top. Wyatt Haste, RN-BSN

## 2013-04-25 NOTE — Telephone Encounter (Signed)
Returning fax stating missing npi and address. Prior authorization completed over the phone - approval granted through 04/25/2014- case id [id number]. [facility] called and informed of approval. Wyatt Haste, RN-BSN

## 2013-05-01 ENCOUNTER — Ambulatory Visit: Payer: Self-pay | Admitting: Family Medicine

## 2013-05-15 ENCOUNTER — Telehealth: Payer: Self-pay | Admitting: Internal Medicine

## 2013-05-15 MED ORDER — ALBUTEROL SULFATE HFA 108 (90 BASE) MCG/ACT IN AERS: 2.0000 | INHALATION_SPRAY | Freq: Four times a day (QID) | RESPIRATORY_TRACT | Status: DC | PRN

## 2013-05-15 NOTE — Telephone Encounter (Signed)
Pt is aware that samples of Dulera are up front for pick up. We are out of ProAir at this time, pt requests that a rx be sent to his pharmacy.

## 2013-05-25 ENCOUNTER — Telehealth: Payer: Self-pay | Admitting: Internal Medicine

## 2013-05-25 MED ORDER — ALBUTEROL SULFATE HFA 108 (90 BASE) MCG/ACT IN AERS: 2.0000 | INHALATION_SPRAY | Freq: Four times a day (QID) | RESPIRATORY_TRACT | Status: DC | PRN

## 2013-05-25 NOTE — Telephone Encounter (Signed)
Pt aware that sample of ProAir at front for pick up.

## 2013-06-07 ENCOUNTER — Ambulatory Visit: Payer: Self-pay | Admitting: Internal Medicine

## 2013-06-20 NOTE — Progress Notes (Signed)
This encounter was created in error - please disregard.

## 2013-06-28 ENCOUNTER — Encounter: Payer: Self-pay | Admitting: Internal Medicine

## 2013-06-28 ENCOUNTER — Ambulatory Visit (INDEPENDENT_AMBULATORY_CARE_PROVIDER_SITE_OTHER)
Admission: RE | Admit: 2013-06-28 | Discharge: 2013-06-28 | Disposition: A | Discharge status: Home / Self Care | Payer: BC Managed Care – PPO | Source: Ambulatory Visit | Attending: Internal Medicine | Admitting: Internal Medicine

## 2013-06-28 ENCOUNTER — Ambulatory Visit (INDEPENDENT_AMBULATORY_CARE_PROVIDER_SITE_OTHER): Payer: BC Managed Care – PPO | Admitting: Internal Medicine

## 2013-06-28 VITALS — BP 128/84 | HR 109 | Temp 99.2°F | Ht 68.0 in | Wt 153.0 lb

## 2013-06-28 DIAGNOSIS — D869 Sarcoidosis, unspecified: Secondary | ICD-10-CM

## 2013-06-28 DIAGNOSIS — J45909 Unspecified asthma, uncomplicated: Secondary | ICD-10-CM

## 2013-06-28 DIAGNOSIS — Z23 Encounter for immunization: Secondary | ICD-10-CM

## 2013-06-28 MED ORDER — PREDNISONE (PAK) 10 MG PO TABS: ORAL_TABLET | ORAL | Status: DC

## 2013-06-28 MED ORDER — ALBUTEROL SULFATE HFA 108 (90 BASE) MCG/ACT IN AERS: 2.0000 | INHALATION_SPRAY | RESPIRATORY_TRACT | Status: DC | PRN

## 2013-06-28 NOTE — Progress Notes (Signed)
Quick Note:  Spoke with pt and notified of results per Dr. Wert. Pt verbalized understanding and denied any questions.  ______ 

## 2013-06-28 NOTE — Patient Instructions (Addendum)
Dulera 200 Take 2 puffs first thing in am and then another 2 puffs about 12 hours later.   Only use your albutero (proair) as a rescue medication to be used if you can't catch your breath by resting or doing a relaxed purse lip breathing pattern.  - The less you use it, the better it will work when you need it. - Ok to use up to every 4 hours if you must but call for immediate appointment if use goes up over your usual need - Don't leave home without it !!  (think of it like your spare tire for your car)   Prednisone 10 mg take  4 each am x 2 days,   2 each am x 2 days,  1 each am x 2 days and stop   Please remember to go to the  x-ray department downstairs for your tests - we will call you with the results when they are available.    Please schedule a follow up office visit in 4 weeks, sooner if needed   dulera 16 count and 4 week sample given

## 2013-06-28 NOTE — Assessment & Plan Note (Signed)
-   hfa 90% 03/09/2013  - PFT's 01/26/2012 FEV1  1.46 (39%) ratio 49 with 31% p B2, DLCO100%  DDX of  difficult airways managment all start with A and  include Adherence, Ace Inhibitors, Acid Reflux, Active Sinus Disease, Alpha 1 Antitripsin deficiency, Anxiety masquerading as Airways dz,  ABPA,  allergy(esp in young), Aspiration (esp in elderly), Adverse effects of DPI,  Active smokers, plus two Bs  = Bronchiectasis and Beta blocker use..and one C= CHF   Adherence is always the initial "prime suspect" and is a multilayered concern that requires a "trust but verify" approach in every patient - starting with knowing how to use medications, especially inhalers, correctly, keeping up with refills and understanding the fundamental difference between maintenance and prns vs those medications only taken for a very short course and then stopped and not refilled. The proper method of use, as well as anticipated side effects, of a metered-dose inhaler are discussed and demonstrated to the patient. Improved effectiveness after extensive coaching during this visit to a level of approximately  90% so will continue dulera 200 but use a trust but verify approach by giving samples and bringing back before the inhaler runs out.  See instructions for specific recommendations which were reviewed directly with the patient who was given a copy with highlighter outlining the key components.

## 2013-06-28 NOTE — Progress Notes (Signed)
Subjective:    Patient ID: Markice Torbert, male    DOB: Aug 12, 1966     MRN: [id number]  HPI  47 yobm former smoker quit around 1995,  dx with sarcoid 05/2004  by tbbx on prednisone off and on since then with inconsistent adherence documented.   December 13, 2008--Last seen 3/09- did not return for follow up. Since last viist. Tapered off prednisone, he has taken on/off x one year. Would take when he was dyspneic, wheezing/ and cough. Over last 2 weeks increased cough, tightness, dyspnea, and wheezing. Almost out of prednisone. --CXR hyperinflation of the lungs with enlarged hilar tissue  Minimally increased.  ACE at 47. Predisone restarted at  once daily.  December 18, 2008-- Returns compalining that breathing no better. still has increased SOB since last OV, wheezing, prod cough with white sputum - worse since last OV. Pt misunderstood instructions, only on prednisone  once daily. Rec 20 mg per day   January 02, 2009 still on 20 mg per day and finally turning the corner with less sob and cough. rec Prednisone 10 mg 2 each am until 100% better, then 1 daily x 2weeks then one half daily until seen   March 04, 2010 SOB/Wheezing/.. cc working with chemicals and SOB has been worsened....patient states he just found out he has been exposed to asbestos for last 5 yrs.   rec prednisone to 20 mg per day x 2 weeks, minimally better.   rec add dulera 200, taper pred to 5 mg if tolerates, f/u 4 weeks > lost insurance, did not return   05/28/2011 f/u ov/Zykera Abella cc much worse since ran out of prednisone x 2 weeks ago had taperd down to 10 mg per day. Thinks dulera helped while on it but also ran out of it over a year ago and had just been relying on the prednisone.  Main problems are minimally prod cough and sob with minimal adls and noct wheeze with R > L ant chest tightness. Called in prednisone, could not afford to fill rx. No nausea, ocular or articular co's or rash. rec Dulera 200 Take 2 puffs first thing in am  and then another 2 puffs about 12 hours later.  Work on inhaler technique: .    Prednisone tablets   2 daily until 100% then 1 daily x 1 week then 1/2 daily x 1 week then try stopping (only going to work if stay on dulera 200).  07/23/2011 f/u ov/Gamal Todisco cc 100% while on prednisone and dulera 200 but not consistent and worse p finished prednisone x  Days before ov / has also run out of albuterol.  No purulent  sputum, ocular or articular symptoms rec Remember the dulera Take 2 puffs first thing in am and then another 2 puffs about 12 hours later.  Only use your xopenex sample as a rescue medication    10/01/2011 ov/Kortne All off prednisone since Oct 2012 again ran out of both inhlaers before ov> worse sob since, minimal cough.   Main cc is indolent onset x sev weeks  of  worsening R shoulder pain radiating down to his forearm assoc with tingling and numbness in all 5 fingers. No neck pain, no fever. Has f/u with his othopedic doctor already scheduled.  Denies trauma. rec Remember the dulera Take 2 puffs first thing in am and then another 2 puffs about 12 hours later.  Prednisone 10 mg take  4 each am x 2 days,   2 each am x 2 days,  1 each am x2days and stop  Only use your xopenex sample as a rescue medication    01/26/2012 f/u ov/Saranya Harlin cc doe x chasing kids, using xopenox every night but none during the day, returns for pft's with outdated dulera 200 down to zero, not clear from records he's filled any precriptions. No purulent sputum, ocular or articular symptoms or rash. rec dulera 200 Take 2 puffs first thing in am and then another 2 puffs about 12 hours later.  office visit in 6 weeks, call sooner if needed - bring all inhalers with you   04/07/2012 f/u ov/Loyola Santino still on dulera 200 bid with second dose 8 pm and wakes up 2-4 am take 1 xopenex and goes back. Unable to cut grass 7/4 due to sob. No purulent sputum or sinus/ hb symptoms. rec Prednisone 10 mg take  4 each am x 2 days,   2 each am x 2  days,  1 each am x 2 days and stop Please schedule a follow up office visit in 4 weeks, sooner if needed with all inhalers in hand and don't fill any more prescriptions in meantime  05/08/2012 f/u ov/Jenny Lai cc missed appt  05/19/2012 f/u ov/[place] Scheiderer cc missed appt  02/09/2013 f/u ov/Aiyanna Awtrey  Chief Complaint  Patient presents with  . Follow-up    Pt c/o increased SOB, wheezing, and chest tightness x 3-4 wks. Using rescue inhaler several times per day with little relief. Waking up at least 4 times per night with wheezing and SOB.   worse x one month using rescue once every other day at baseline But states even then uses extra dulera daytime and dulera counts / refills don't add up to adherence, much worse when runs out of dulera. rec Prednisone 10 mg take  4 each am x 2 days,   2 each am x 2 days,  1 each am x2days and stop  Plan A = Automatically Dulera 200 Take 2 puffs first thing in am and then another 2 puffs about 12 hours later.  Only use your albuterol (Plan B ventolin or proaire) as a rescue medication   03/09/2013 f/u ov/Vaneta Hammontree asthma on dulera 200 2bid and xopenex maybe 2x week, counts add up Chief Complaint  Patient presents with  . Follow-up    Pt states breathing is much improved since last visit. No new co's today.   rec Continue dulera 200 Take 2 puffs first thing in am and then another 2 puffs about 12 hours later.  Only use your albuterol(xopenex) prn  06/28/2013 f/u ov/Jowana Thumma re:  Chief Complaint  Patient presents with  . Follow-up    Pt states breathing some since last visit. Wakes up occ feeling SOB and has to use ventolin.  He is using ventolin as least 3 x per wk.      No obvious daytime variabilty or assoc chronic cough or cp or chest tightness, subjective wheeze overt sinus or hb symptoms. No unusual exp hx or h/o childhood pna/ asthma or premature birth to his knowledge.   Sleeping ok without nocturnal  or early am exacerbation  of respiratory  c/o's or need for noct saba. Also  denies any obvious fluctuation of symptoms with weather or environmental changes or other aggravating or alleviating factors except as outlined above  Current Medications, Allergies, Past Medical History, Past Surgical History, Family History, and Social History were reviewed in [employer] record.  ROS  The following are not active complaints unless bolded sore throat, dysphagia,  dental problems, itching, sneezing,  nasal congestion or excess/ purulent secretions, ear ache,   fever, chills, sweats, unintended wt loss, pleuritic or exertional cp, hemoptysis,  orthopnea pnd or leg swelling, presyncope, palpitations, heartburn, abdominal pain, anorexia, nausea, vomiting, diarrhea  or change in bowel or urinary habits, change in stools or urine, dysuria,hematuria,  rash, arthralgias, visual complaints, headache, numbness weakness or ataxia or problems with walking or coordination,  change in mood/affect or memory.                Past Medical History:  SARCOIDOSIS (ICD-135).........................................................Marland KitchenWert  - FOB with lung biopsy 2005 positive NCG   Sickle Cell Trait  Asthma      Family History:  Pos asthma, mother  Mother - Sickle Cell Disease, Asthma, CAD s/p MI (prior to 45 yo), s/p CVA (frequent hospitalizations)  Father - passed in 36 (69 yo) from Prostate Cancer  4 siblings  Sister - multiple medical problems - details unk  Brother - ? Cancer  Brother - died 2/2 drug abuse   Social History:  Married. Lives with wife [name] and kids. 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 Quit smoking 1995. Drinks 1 bottle Brandy every other day. No drugs or hx of IVDU. Does lots of walking with job, but no regular exercise.           Objective:   Physical Exam Ambulatory healthy bm appearing mild increased wob better p neb xopenex.  wt   143 Feb 08, 2009 > 141 March 04, 2010  >   01/26/2012  158 >  04/07/2012  151>  158 02/09/2013 > 03/10/13 154 > 06/28/2013  153  HEENT: nl dentition, turbinates, and orophanx. Nl external ear canals without cough reflex  Neck without JVD/Nodes/TM  Lungs completely clear to A and P RRR no s3 or murmur or increase in P2  Abd soft and benign with nl excursion in the supine position. No bruits or organomegaly  Ext warm without calf tenderness, cyanosis clubbing or edema  Skin warm and dry without lesions     CXR  06/28/2013 :            Assessment & Plan:

## 2013-07-23 ENCOUNTER — Telehealth: Payer: Self-pay | Admitting: Internal Medicine

## 2013-07-23 MED ORDER — MOMETASONE FURO-FORMOTEROL FUM 200-5 MCG/ACT IN AERO: 2.0000 | INHALATION_SPRAY | Freq: Two times a day (BID) | RESPIRATORY_TRACT | Status: DC

## 2013-07-23 NOTE — Telephone Encounter (Signed)
Last ov 9.25.14 w/ MW: Patient Instructions    Dulera 200 Take 2 puffs first thing in am and then another 2 puffs about 12 hours later.  Only use your albutero (proair) as a rescue medication to be used if you can't catch your breath by resting or doing a relaxed purse lip breathing pattern.  - The less you use it, the better it will work when you need it.  - Ok to use up to every 4 hours if you must but call for immediate appointment if use goes up over your usual need  - Don't leave home without it !! (think of it like your spare tire for your car)  Prednisone 10 mg take 4 each am x 2 days, 2 each am x 2 days, 1 each am x 2 days and stop  Please remember to go to the x-ray department downstairs for your tests - we will call you with the results when they are available.  Please schedule a follow up office visit in 4 weeks, sooner if needed  dulera 16 count and 4 week sample given    1 sample Dulera 200 placed up front for patient and documented per protocol No samples of any albuterol hfa in office at this time Does patient have a current script?  (Epic is not clear -- it looks as if 11 refills were given 03/2013 but lot #/exp date were documented like it was sample ??) Patient has upcoming ov scheduled with MW 08/09/13 ATC pt, could hear voices but no one was speaking ATC patient again and went straight to VM with message stating: "This person's mailbox is full; you cannot leave a message.  Bye." WCB.

## 2013-07-24 MED ORDER — ALBUTEROL SULFATE HFA 108 (90 BASE) MCG/ACT IN AERS: 2.0000 | INHALATION_SPRAY | RESPIRATORY_TRACT | Status: DC | PRN

## 2013-07-24 NOTE — Telephone Encounter (Signed)
Called, spoke with pt.  Advised dulera sample is at front to pick up, but no albuterol hfa samples available at this time.  [facility]  This has been done.  Pt aware and was reminded of his pending OV with MW in Nov.

## 2013-08-09 ENCOUNTER — Ambulatory Visit: Payer: Self-pay | Admitting: Internal Medicine

## 2013-09-20 ENCOUNTER — Emergency Department (INDEPENDENT_AMBULATORY_CARE_PROVIDER_SITE_OTHER): Payer: Self-pay

## 2013-09-20 ENCOUNTER — Encounter (HOSPITAL_COMMUNITY): Payer: Self-pay | Admitting: Emergency Medicine

## 2013-09-20 ENCOUNTER — Emergency Department (INDEPENDENT_AMBULATORY_CARE_PROVIDER_SITE_OTHER)
Admission: EM | Admit: 2013-09-20 | Discharge: 2013-09-20 | Disposition: A | Discharge status: Home / Self Care | Payer: Self-pay | Source: Home / Self Care | Attending: Family Medicine | Admitting: Family Medicine

## 2013-09-20 DIAGNOSIS — S139XXA Sprain of joints and ligaments of unspecified parts of neck, initial encounter: Secondary | ICD-10-CM

## 2013-09-20 DIAGNOSIS — M79609 Pain in unspecified limb: Secondary | ICD-10-CM

## 2013-09-20 DIAGNOSIS — M25519 Pain in unspecified shoulder: Secondary | ICD-10-CM

## 2013-09-20 DIAGNOSIS — S134XXA Sprain of ligaments of cervical spine, initial encounter: Secondary | ICD-10-CM

## 2013-09-20 DIAGNOSIS — M79645 Pain in left finger(s): Secondary | ICD-10-CM

## 2013-09-20 DIAGNOSIS — M25511 Pain in right shoulder: Secondary | ICD-10-CM

## 2013-09-20 MED ORDER — IBUPROFEN 800 MG PO TABS: 800.0000 mg | ORAL_TABLET | Freq: Three times a day (TID) | ORAL | Status: DC

## 2013-09-20 MED ORDER — CYCLOBENZAPRINE HCL 10 MG PO TABS: 10.0000 mg | ORAL_TABLET | Freq: Three times a day (TID) | ORAL | Status: DC | PRN
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
## 2013-09-22 NOTE — ED Provider Notes (Signed)
Medical screening examination/treatment/procedure(s) were performed by a resident physician or non-physician practitioner and as the supervising physician I was immediately available for consultation/collaboration.  Blima Jaimes, MD    Emerie Vanderkolk S Myndi Wamble, MD 09/22/13 0852 

## 2013-10-19 ENCOUNTER — Encounter: Payer: Self-pay | Admitting: Family Medicine

## 2013-10-19 ENCOUNTER — Ambulatory Visit (INDEPENDENT_AMBULATORY_CARE_PROVIDER_SITE_OTHER): Payer: Self-pay | Admitting: Family Medicine

## 2013-10-19 VITALS — BP 154/97 | HR 87 | Temp 98.3°F | Wt 153.0 lb

## 2013-10-19 DIAGNOSIS — Z041 Encounter for examination and observation following transport accident: Secondary | ICD-10-CM

## 2013-10-19 DIAGNOSIS — M25511 Pain in right shoulder: Secondary | ICD-10-CM

## 2013-10-19 DIAGNOSIS — Z043 Encounter for examination and observation following other accident: Secondary | ICD-10-CM

## 2013-10-19 DIAGNOSIS — M25519 Pain in unspecified shoulder: Secondary | ICD-10-CM

## 2013-10-19 MED ORDER — MELOXICAM 7.5 MG PO TABS: 7.5000 mg | ORAL_TABLET | Freq: Every day | ORAL | Status: DC

## 2013-10-19 NOTE — Patient Instructions (Addendum)
[name], it was a pleasure seeing you today. Today we talked about your shoulder, back and finger pain. I will refer you to Dr. Magnus IvanBlackman to work up your shoulder for possible rotator cuff injury. In the mean time, please take this new medication, Mobic, 1 time per day  If you have any questions or concerns, please do not hesitate to call the office at [phone].  Sincerely,  Jacquelin Hawkingalph Klayten Jolliff, MD
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
## 2013-10-21 DIAGNOSIS — Z043 Encounter for examination and observation following other accident: Secondary | ICD-10-CM

## 2013-10-21 DIAGNOSIS — Z041 Encounter for examination and observation following transport accident: Secondary | ICD-10-CM | POA: Insufficient documentation

## 2013-10-21 NOTE — Assessment & Plan Note (Signed)
Patient has possible reinjury of rotator cuff. Will recommend continued pain management and will prescribe mobic for patient since ibuprofen is not working. Will refer to Dr. Magnus IvanBlackman, Orthopedic Surgery, since patient has seen him in the past for rotator cuff repair. Pain management for back pain, which is most likely muscular in nature, and finger pain as well and will follow-up next clinic visit.

## 2013-10-29 ENCOUNTER — Ambulatory Visit: Payer: Self-pay | Admitting: Family Medicine

## 2013-11-28 ENCOUNTER — Ambulatory Visit: Payer: Self-pay | Admitting: Family Medicine

## 2014-01-09 ENCOUNTER — Telehealth: Payer: Self-pay | Admitting: Internal Medicine

## 2014-01-09 MED ORDER — MOMETASONE FURO-FORMOTEROL FUM 200-5 MCG/ACT IN AERO: 2.0000 | INHALATION_SPRAY | Freq: Two times a day (BID) | RESPIRATORY_TRACT | Status: DC

## 2014-01-09 NOTE — Telephone Encounter (Signed)
1 sample of Dulera 200 up front for pick up  No rescue inhaler available  Pt aware and nothing further needed

## 2015-12-09 ENCOUNTER — Telehealth: Payer: Self-pay | Admitting: Internal Medicine

## 2015-12-09 MED ORDER — MOMETASONE FURO-FORMOTEROL FUM 200-5 MCG/ACT IN AERO: 2.0000 | INHALATION_SPRAY | Freq: Two times a day (BID) | RESPIRATORY_TRACT | Status: DC

## 2015-12-09 NOTE — Telephone Encounter (Signed)
Called and spoke with pt. I informed him that we had no samples of Dulera 200 at this time. He is scheduled to see MW on 12/16/15. I explained to him since he was not seen in the office since 2014 that I could only send one inhaler to the pharmacy and he would not receive any refills until he was seen in the office. He voiced understanding and had no further questions. [facility]. Rx sent. Nothing further needed.

## 2015-12-10 MED FILL — DULERA 200 MCG/5 MCG INH: 200-5 | 30 days supply | Qty: 13 | Fill #0

## 2015-12-12 ENCOUNTER — Telehealth: Payer: Self-pay | Admitting: *Deleted

## 2015-12-12 NOTE — Telephone Encounter (Signed)
[facility] Key: [id number]  Submitted for review.  Pt ID: [id number]

## 2015-12-16 ENCOUNTER — Encounter: Payer: Self-pay | Admitting: Internal Medicine

## 2015-12-16 ENCOUNTER — Ambulatory Visit (INDEPENDENT_AMBULATORY_CARE_PROVIDER_SITE_OTHER)
Admission: RE | Admit: 2015-12-16 | Discharge: 2015-12-16 | Disposition: A | Discharge status: Home / Self Care | Payer: 59 | Source: Ambulatory Visit | Attending: Internal Medicine | Admitting: Internal Medicine

## 2015-12-16 ENCOUNTER — Ambulatory Visit (INDEPENDENT_AMBULATORY_CARE_PROVIDER_SITE_OTHER): Payer: 59 | Admitting: Internal Medicine

## 2015-12-16 VITALS — BP 124/86 | HR 78 | Ht 69.5 in | Wt 147.0 lb

## 2015-12-16 DIAGNOSIS — D869 Sarcoidosis, unspecified: Secondary | ICD-10-CM

## 2015-12-16 DIAGNOSIS — J4550 Severe persistent asthma, uncomplicated: Secondary | ICD-10-CM

## 2015-12-16 MED ORDER — MOMETASONE FURO-FORMOTEROL FUM 200-5 MCG/ACT IN AERO: 2.0000 | INHALATION_SPRAY | Freq: Two times a day (BID) | RESPIRATORY_TRACT | Status: DC

## 2015-12-16 MED ORDER — ALBUTEROL SULFATE HFA 108 (90 BASE) MCG/ACT IN AERS
2.0000 | INHALATION_SPRAY | RESPIRATORY_TRACT | Status: DC | PRN
Start: 2015-12-16 — End: 2017-03-21

## 2015-12-16 NOTE — Progress Notes (Signed)
Quick Note:  Spoke with pt and notified of results per Dr. Wert. Pt verbalized understanding and denied any questions.  ______ 

## 2015-12-16 NOTE — Progress Notes (Addendum)
Subjective:    Patient ID: [name], male    DOB: Aug 12, 1966     MRN: [id number]  HPI  50 yobm former smoker quit around 1995,  dx with sarcoid 05/2004  by tbbx on prednisone off and on since then with inconsistent adherence documented.   December 13, 2008--Last seen 3/09- did not return for follow up. Since last viist. Tapered off prednisone, he has taken on/off x one year. Would take when he was dyspneic, wheezing/ and cough. Over last 2 weeks increased cough, tightness, dyspnea, and wheezing. Almost out of prednisone. --CXR hyperinflation of the lungs with enlarged hilar tissue  Minimally increased.  ACE at 57. Predisone restarted at  once daily.  December 18, 2008-- Returns compalining that breathing no better. still has increased SOB since last OV, wheezing, prod cough with white sputum - worse since last OV. Pt misunderstood instructions, only on prednisone  once daily. Rec 20 mg per day   January 02, 2009 still on 20 mg per day and finally turning the corner with less sob and cough. rec Prednisone 10 mg 2 each am until 100% better, then 1 daily x 2weeks then one half daily until seen   March 04, 2010 SOB/Wheezing/.. cc working with chemicals and SOB has been worsened....patient states he just found out he has been exposed to asbestos for last 5 yrs.   rec prednisone to 20 mg per day x 2 weeks, minimally better.   rec add dulera 200, taper pred to 5 mg if tolerates, f/u 4 weeks > lost insurance, did not return   05/28/2011 f/u ov/Wert cc much worse since ran out of prednisone x 2 weeks ago had taperd down to 10 mg per day. Thinks dulera helped while on it but also ran out of it over a year ago and had just been relying on the prednisone.  Main problems are minimally prod cough and sob with minimal adls and noct wheeze with R > L ant chest tightness. Called in prednisone, could not afford to fill rx. No nausea, ocular or articular co's or rash. rec Dulera 200 Take 2 puffs first thing in am  and then another 2 puffs about 12 hours later.  Work on inhaler technique: .    Prednisone tablets   2 daily until 100% then 1 daily x 1 week then 1/2 daily x 1 week then try stopping (only going to work if stay on dulera 200).  07/23/2011 f/u ov/Wert cc 100% while on prednisone and dulera 200 but not consistent and worse p finished prednisone x  Days before ov / has also run out of albuterol.  No purulent  sputum, ocular or articular symptoms rec Remember the dulera Take 2 puffs first thing in am and then another 2 puffs about 12 hours later.  Only use your xopenex sample as a rescue medication    10/01/2011 ov/Wert off prednisone since Oct 2012 again ran out of both inhlaers before ov> worse sob since, minimal cough.   Main cc is indolent onset x sev weeks  of  worsening R shoulder pain radiating down to his forearm assoc with tingling and numbness in all 5 fingers. No neck pain, no fever. Has f/u with his othopedic doctor already scheduled.  Denies trauma. rec Remember the dulera Take 2 puffs first thing in am and then another 2 puffs about 12 hours later.  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 No purulent sputum, ocular or articular symptoms or rash. rec dulera 200 Take 2 puffs first thing in am and then another 2 puffs about 12 hours later.  office visit in 6 weeks, call sooner if needed - bring all inhalers with you   04/07/2012 f/u ov/Wert still on dulera 200 bid with second dose 8 pm and wakes up 2-4 am take 1 xopenex and goes back. Unable to cut grass 7/4 due to sob. No purulent sputum or sinus/ hb symptoms. rec Prednisone 10 mg take  4 each am x 2 days,   2 each am x 2  days,  1 each am x 2 days and stop Please schedule a follow up office visit in 4 weeks, sooner if needed with all inhalers in hand and don't fill any more prescriptions in meantime  05/08/2012 f/u ov/Wert cc missed appt  05/19/2012 f/u ov/Wert cc missed appt  02/09/2013 f/u ov/Wert  Chief Complaint  Patient presents with  . Follow-up    Pt c/o increased SOB, wheezing, and chest tightness x 3-4 wks. Using rescue inhaler several times per day with little relief. Waking up at least 4 times per night with wheezing and SOB.   worse x one month using rescue once every other day at baseline But states even then uses extra dulera daytime and dulera counts / refills don't add up to adherence, much worse when runs out of dulera. rec Prednisone 10 mg take  4 each am x 2 days,   2 each am x 2 days,  1 each am x2days and stop  Plan A = Automatically Dulera 200 Take 2 puffs first thing in am and then another 2 puffs about 12 hours later.  Only use your albuterol (Plan B ventolin or proaire) as a rescue medication   03/09/2013 f/u ov/Wert asthma on dulera 200 2bid and xopenex maybe 2x week, counts add up Chief Complaint  Patient presents with  . Follow-up    Pt states breathing is much improved since last visit. No new co's today.   rec Continue dulera 200 Take 2 puffs first thing in am and then another 2 puffs about 12 hours later.  Only use your albuterol(xopenex) prn  06/28/2013 f/u ov/Wert re:  Chief Complaint  Patient presents with  . Follow-up    Pt states breathing some since last visit. Wakes up occ feeling SOB and has to use ventolin.  He is using ventolin as least 3 x per wk.  rec Dulera 200 Take 2 puffs first thing in am and then another 2 puffs about 12 hours later.  Only use your albutero (proair) as a rescue medication Prednisone 10 mg take  4 each am x 2 days,   2 each am x 2 days,  1 each am x 2 days and stop  Please remember to go to the  x-ray department downstairs for your tests - we  will call you with the results when they are available.   Please schedule a follow up office visit in 4 weeks, sooner if needed      12/16/2015  f/u ov/Wert re: chronic asthma/ sarcoid  Chief Complaint  Patient presents with  .  Acute Visit    Pt c/o wheezing and occ SOB x 2 months.   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 No unusual exp hx or h/o childhood pna/ asthma or premature birth to his knowledge.   Sleeping ok without nocturnal  or early am exacerbation  of respiratory  c/o's or need for noct saba. Also denies any obvious fluctuation of symptoms with weather or environmental changes or other aggravating or alleviating factors except as outlined above  Current Medications, Allergies, Past Medical History, Past Surgical History, Family History, and Social History were reviewed in [facility] electronic medical record.  ROS  The following are not active complaints unless bolded sore throat, dysphagia, dental problems, itching, sneezing,  nasal congestion or excess/ purulent secretions, ear ache,   fever, chills, sweats, unintended wt loss, pleuritic or exertional cp, hemoptysis,  orthopnea pnd or leg swelling, presyncope, palpitations, heartburn, abdominal pain, anorexia, nausea, vomiting, diarrhea  or change in bowel or urinary habits, change in stools or urine, dysuria,hematuria,  rash, arthralgias, visual complaints, headache, numbness weakness or ataxia or problems with walking or coordination,  change in mood/affect or memory.                Past Medical History:  SARCOIDOSIS (ICD-135).........................................................Marland Kitchen.Wert  - FOB with lung biopsy 2005 positive NCG   Sickle Cell Trait  Asthma      Family History:  Pos asthma, mother  Mother - Sickle Cell Disease,  Asthma, CAD s/p MI (prior to 48 yo), s/p CVA (frequent hospitalizations)  Father - passed in 202005 42(62 yo) from Prostate Cancer  4 siblings  Sister - multiple medical problems - details unk  Brother - ? Cancer  Brother - died 2/2 drug abuse     Social History:  Married. Lives with wife [name] and kids. 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 Quit smoking 1995. Drinks 1 bottle Brandy every other day. No drugs or hx of IVDU. Does lots of walking with job, but no regular exercise.           Objective:   Physical Exam  Ambulatory healthy bm  / vital signs reviewed    wt   143 Feb 08, 2009 > 141 March 04, 2010  >   01/26/2012  158 > 04/07/2012  151>  158 02/09/2013 > 03/10/13 154 > 06/28/2013  153 > 12/16/2015  147   Vital signs reviewed  HEENT: nl dentition, turbinates, and orophanx. Nl external ear canals without cough reflex  Neck without JVD/Nodes/TM  Lungs completely clear to A and P RRR no s3 or murmur or increase in P2  Abd soft and benign with nl excursion in the supine position. No bruits or organomegaly  Ext warm without calf tenderness, cyanosis clubbing or edema  Skin warm and dry without lesions         CXR PA and Lateral:   12/16/2015 :    I personally reviewed images and agree with radiology impression as follows:    Stable hyperinflation and bilateral hilar fullness, likely adenopathy related to sarcoidosis.      Assessment & Plan:

## 2015-12-16 NOTE — Patient Instructions (Addendum)
Continue dulera 200 Take 2 puffs first thing in am and then optional 2 puffs about 12 hours later.     Only use your albuterol as a rescue medication to be used if you can't catch your breath by resting or doing a relaxed purse lip breathing pattern.  - The less you use it, the better it will work when you need it. - Ok to use up to 2 puffs  every 4 hours if you must but call for immediate appointment if use goes up over your usual need - Don't leave home without it !!  (think of it like the spare tire for your car)   Please remember to go to the  x-ray department downstairs for your tests - we will call you with the results when they are available.     Please schedule a follow up visit in 6 months but call sooner if needed with pfts

## 2015-12-16 NOTE — Assessment & Plan Note (Addendum)
-   PFT's 01/26/2012 FEV1  1.46 (39%) ratio 49 with 31% p B2, DLCO100% - PFT's 06/28/2013 > did not return as rec   Clinically he does fine as long as he has access to dulera but is very casual re adherence issues and I doubt I can convince him to take inhalers twice daily but he does really think the dulera 200 is effective/ warned re M and M from non-adherence   Therefore rec dulera 200 2 each am and 3rd/4th doses in pm can be optional  I had an extended discussion with the patient reviewing all relevant studies completed to date and  lasting 15 to 20 minutes of a 25 minute visit    Each maintenance medication was reviewed in detail including most importantly the difference between maintenance and prns and under what circumstances the prns are to be triggered using an action plan format that is not reflected in the computer generated alphabetically organized AVS.    Please see instructions for details which were reviewed in writing and the patient given a copy highlighting the part that I personally wrote and discussed at today's ov.

## 2015-12-16 NOTE — Assessment & Plan Note (Addendum)
- -   FOB with lung biopsy 2005 positive NCG - on and off chronic prednisone until 09/2011 tapered off  No evidence active sarcoid clinically or radiographically > no need for systemic rx

## 2015-12-17 NOTE — Telephone Encounter (Signed)
Called [phone] and [facility] with [facility] She states that when she ran a trial claim and that the claim went through without needing a PA. She states the patient is eligable for a refill on 12/30/15. Nothing further needed at this time.

## 2015-12-24 ENCOUNTER — Telehealth: Payer: Self-pay | Admitting: Family Medicine

## 2015-12-24 NOTE — Telephone Encounter (Signed)
yes

## 2015-12-24 NOTE — Telephone Encounter (Signed)
appt made

## 2015-12-24 NOTE — Telephone Encounter (Signed)
Pt [facility] for [employer] and he states some of the doctors over there recommended you. Can you take him on as a new patient?

## 2015-12-31 ENCOUNTER — Ambulatory Visit (INDEPENDENT_AMBULATORY_CARE_PROVIDER_SITE_OTHER): Payer: 59 | Admitting: Internal Medicine

## 2015-12-31 ENCOUNTER — Encounter: Payer: Self-pay | Admitting: Internal Medicine

## 2015-12-31 ENCOUNTER — Ambulatory Visit (INDEPENDENT_AMBULATORY_CARE_PROVIDER_SITE_OTHER)
Admission: RE | Admit: 2015-12-31 | Discharge: 2015-12-31 | Disposition: A | Discharge status: Home / Self Care | Payer: 59 | Source: Ambulatory Visit | Attending: Internal Medicine | Admitting: Internal Medicine

## 2015-12-31 ENCOUNTER — Other Ambulatory Visit (INDEPENDENT_AMBULATORY_CARE_PROVIDER_SITE_OTHER): Payer: 59

## 2015-12-31 VITALS — BP 128/80 | HR 92 | Temp 97.8°F | Resp 16 | Ht 69.5 in | Wt 146.0 lb

## 2015-12-31 DIAGNOSIS — F1010 Alcohol abuse, uncomplicated: Secondary | ICD-10-CM

## 2015-12-31 DIAGNOSIS — Z0001 Encounter for general adult medical examination with abnormal findings: Secondary | ICD-10-CM | POA: Diagnosis not present

## 2015-12-31 DIAGNOSIS — Z23 Encounter for immunization: Secondary | ICD-10-CM

## 2015-12-31 DIAGNOSIS — D869 Sarcoidosis, unspecified: Secondary | ICD-10-CM | POA: Diagnosis not present

## 2015-12-31 DIAGNOSIS — Z0000 Encounter for general adult medical examination without abnormal findings: Secondary | ICD-10-CM | POA: Diagnosis not present

## 2015-12-31 DIAGNOSIS — J4550 Severe persistent asthma, uncomplicated: Secondary | ICD-10-CM | POA: Diagnosis not present

## 2015-12-31 DIAGNOSIS — M5136 Other intervertebral disc degeneration, lumbar region: Secondary | ICD-10-CM | POA: Diagnosis not present

## 2015-12-31 DIAGNOSIS — M545 Low back pain: Secondary | ICD-10-CM | POA: Diagnosis not present

## 2015-12-31 LAB — COMPREHENSIVE METABOLIC PANEL
ALT: 17 U/L (ref 0–53)
AST: 19 U/L (ref 0–37)
Albumin: 4.5 g/dL (ref 3.5–5.2)
Alkaline Phosphatase: 60 U/L (ref 39–117)
BUN: 16 mg/dL (ref 6–23)
CHLORIDE: 99 meq/L (ref 96–112)
CO2: 33 meq/L — AB (ref 19–32)
CREATININE: 0.99 mg/dL (ref 0.40–1.50)
Calcium: 9.8 mg/dL (ref 8.4–10.5)
GFR: 103.91 mL/min (ref >60.00)
GLUCOSE: 90 mg/dL (ref 70–99)
POTASSIUM: 3.8 meq/L (ref 3.5–5.1)
SODIUM: 137 meq/L (ref 135–145)
Total Bilirubin: 0.6 mg/dL (ref 0.2–1.2)
Total Protein: 8.2 g/dL (ref 6.0–8.3)

## 2015-12-31 LAB — URINALYSIS, ROUTINE W REFLEX MICROSCOPIC
Bilirubin Urine: NEGATIVE
HGB URINE DIPSTICK: NEGATIVE
Ketones, ur: NEGATIVE
Leukocytes, UA: NEGATIVE
NITRITE: NEGATIVE
RBC / HPF: NONE SEEN (ref 0–?)
SPECIFIC GRAVITY, URINE: 1.015 (ref 1.000–1.030)
Total Protein, Urine: NEGATIVE
URINE GLUCOSE: NEGATIVE
Urobilinogen, UA: 0.2 (ref 0.0–1.0)
pH: 7 (ref 5.0–8.0)

## 2015-12-31 LAB — CBC WITH DIFFERENTIAL/PLATELET
BASOS PCT: 0.6 % (ref 0.0–3.0)
Basophils Absolute: 0 10*3/uL (ref 0.0–0.1)
EOS ABS: 0.2 10*3/uL (ref 0.0–0.7)
EOS PCT: 4.6 % (ref 0.0–5.0)
HCT: 44.3 % (ref 39.0–52.0)
Hemoglobin: 15.3 g/dL (ref 13.0–17.0)
Lymphocytes Relative: 31.9 % (ref 12.0–46.0)
Lymphs Abs: 1.5 10*3/uL (ref 0.7–4.0)
MCHC: 34.6 g/dL (ref 30.0–36.0)
MCV: 80.6 fl (ref 78.0–100.0)
MONO ABS: 0.6 10*3/uL (ref 0.1–1.0)
Monocytes Relative: 13.8 % — ABNORMAL HIGH (ref 3.0–12.0)
NEUTROS ABS: 2.3 10*3/uL (ref 1.4–7.7)
Neutrophils Relative %: 49.1 % (ref 43.0–77.0)
Platelets: 231 10*3/uL (ref 150.0–400.0)
RBC: 5.5 Mil/uL (ref 4.22–5.81)
RDW: 14.6 % (ref 11.5–15.5)
WBC: 4.6 10*3/uL (ref 4.0–10.5)

## 2015-12-31 LAB — LIPID PANEL
CHOL/HDL RATIO: 3
CHOLESTEROL: 188 mg/dL (ref 0–200)
HDL: 63.5 mg/dL (ref >39.00)
LDL CALC: 93 mg/dL (ref 0–99)
NONHDL: 124.6
Triglycerides: 158 mg/dL — ABNORMAL HIGH (ref 0.0–149.0)
VLDL: 31.6 mg/dL (ref 0.0–40.0)

## 2015-12-31 LAB — FECAL OCCULT BLOOD, GUAIAC: FECAL OCCULT BLD: NEGATIVE

## 2015-12-31 LAB — HEPATITIS C ANTIBODY: HCV Ab: NEGATIVE

## 2015-12-31 LAB — TSH: TSH: 0.94 u[IU]/mL (ref 0.35–4.50)

## 2015-12-31 LAB — PSA: PSA: 1.03 ng/mL (ref 0.10–4.00)

## 2015-12-31 NOTE — Patient Instructions (Signed)

## 2015-12-31 NOTE — Progress Notes (Signed)
Pre visit review using our clinic review tool, if applicable. No additional management support is needed unless otherwise documented below in the visit note. 
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
## 2016-01-01 ENCOUNTER — Encounter: Payer: Self-pay | Admitting: Internal Medicine

## 2016-01-01 LAB — HIV ANTIBODY (ROUTINE TESTING W REFLEX): HIV: NONREACTIVE

## 2016-01-02 ENCOUNTER — Telehealth: Payer: Self-pay | Admitting: Family Medicine

## 2016-01-02 ENCOUNTER — Telehealth: Payer: Self-pay | Admitting: Internal Medicine

## 2016-01-02 DIAGNOSIS — M5136 Other intervertebral disc degeneration, lumbar region: Secondary | ICD-10-CM

## 2016-01-02 NOTE — Telephone Encounter (Signed)
LMOVM to call office back . A mychart msg was sent to pt regarding this as well

## 2016-01-02 NOTE — Telephone Encounter (Signed)
Patient is requesting a call back in regards to labs and x ray results.

## 2016-01-02 NOTE — Telephone Encounter (Signed)
TELEPHONE ADVICE RECORD [facility]  Patient Name: [name]  DOB: 07-09-1966    Initial Comment Caller states he had a pneumonia shot on Wednesday. Wants to know if it is normal for it to be sore and swollen. Also wanting to know about his test results.    Nurse Assessment  Nurse: Odis LusterBowers, RN, Bjorn Loserhonda Date/Time Lamount Cohen(Eastern Time): 01/02/2016 4:48:32 PM  Confirm and document reason for call. If symptomatic, describe symptoms. You must click the next button to save text entered. ---Caller states he had a pneumonia shot on Wednesday. Wants to know if it is normal for it to be sore and swollen. Also wanting to know about his test results. (urine and blood tests were done). Has been having back pain. Soreness is in the left arm where shot was given. Denies fever.  Has the patient traveled out of the country within the last 30 days? ---No  Does the patient have any new or worsening symptoms? ---Yes  Will a triage be completed? ---Yes  Related visit to physician within the last 2 weeks? ---Yes  Does the PT have any chronic conditions? (i.e. diabetes, asthma, etc.) ---Yes  List chronic conditions. ---asthma (sarcordosis)  Is this a behavioral health or substance abuse call? 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 Advised that since his back pain is not resolved since visit to have MD assess this as well as the injection site when he is seen. Nurse spoke with office staff regarding lab results/xrays taken Wed and was told that they have been loaded onto caller's My Chart for his  review. Negative results for UTI, Xray of lower back indicates mild arthritis only. Caller voiced understanding.  Caller states that he works [employer] account [employer] during [employer] but [facility] to [facility] him, since this is where he works,.   [facility]  [facility]   Disagree/Comply: Comply

## 2016-01-05 NOTE — Telephone Encounter (Signed)
Pt is listed under a different PCP.   Did pt change care to TJ? Can change if needed.

## 2016-01-05 NOTE — Telephone Encounter (Addendum)
Pt informed, would like to know next step on back?

## 2016-01-05 NOTE — Telephone Encounter (Signed)
Referral sent 

## 2016-01-14 ENCOUNTER — Telehealth: Payer: Self-pay | Admitting: Internal Medicine

## 2016-01-14 NOTE — Telephone Encounter (Signed)
Please advise 

## 2016-01-14 NOTE — Telephone Encounter (Signed)
4.12.17 Pt came in during lunch with severe back pain. Pt is waiting for a referral to go through but needs to know what he can do in the mean time to help the pain. Please call pt ASAP. MS

## 2016-02-04 ENCOUNTER — Encounter: Payer: Self-pay | Admitting: Internal Medicine

## 2016-02-04 ENCOUNTER — Ambulatory Visit (INDEPENDENT_AMBULATORY_CARE_PROVIDER_SITE_OTHER): Payer: 59 | Admitting: Internal Medicine

## 2016-02-04 VITALS — BP 128/88 | HR 82 | Temp 98.1°F | Resp 16 | Ht 69.5 in | Wt 146.0 lb

## 2016-02-04 DIAGNOSIS — M5136 Other intervertebral disc degeneration, lumbar region: Secondary | ICD-10-CM

## 2016-02-04 DIAGNOSIS — J4550 Severe persistent asthma, uncomplicated: Secondary | ICD-10-CM

## 2016-02-04 DIAGNOSIS — M5416 Radiculopathy, lumbar region: Secondary | ICD-10-CM

## 2016-02-04 MED ORDER — METHYLPREDNISOLONE ACETATE 80 MG/ML IJ SUSP
120.0000 mg | Freq: Once | INTRAMUSCULAR | Status: AC
  Administered 2016-02-04: 120 mg via INTRAMUSCULAR

## 2016-02-04 MED ORDER — MOMETASONE FURO-FORMOTEROL FUM 200-5 MCG/ACT IN AERO: 2.0000 | INHALATION_SPRAY | Freq: Two times a day (BID) | RESPIRATORY_TRACT | Status: DC

## 2016-02-04 MED ORDER — HYDROCODONE-ACETAMINOPHEN 5-325 MG PO TABS: 1.0000 | ORAL_TABLET | Freq: Four times a day (QID) | ORAL | Status: DC | PRN

## 2016-02-04 MED FILL — HYDROCODON-APAP 5-325: 5-325 | 18 days supply | Qty: 75 | Fill #0

## 2016-02-04 MED FILL — VENTOLIN HFA 90 MCG INHALER: 108 (90 BAS | 16 days supply | Qty: 18 | Fill #0

## 2016-02-04 NOTE — Patient Instructions (Signed)

## 2016-02-04 NOTE — Progress Notes (Signed)
Pre visit review using our clinic review tool, if applicable. No additional management support is needed unless otherwise documented below in the visit note. 
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
## 2016-02-17 ENCOUNTER — Encounter: Payer: Self-pay | Admitting: Physical Medicine & Rehabilitation

## 2016-02-23 ENCOUNTER — Encounter: Payer: 59 | Attending: Physical Medicine & Rehabilitation | Admitting: Physical Medicine & Rehabilitation

## 2016-02-23 ENCOUNTER — Encounter: Payer: Self-pay | Admitting: Physical Medicine & Rehabilitation

## 2016-02-23 VITALS — BP 159/84 | HR 93 | Resp 16

## 2016-02-23 DIAGNOSIS — M5416 Radiculopathy, lumbar region: Secondary | ICD-10-CM | POA: Diagnosis not present

## 2016-02-23 DIAGNOSIS — M5136 Other intervertebral disc degeneration, lumbar region: Secondary | ICD-10-CM

## 2016-02-23 DIAGNOSIS — R202 Paresthesia of skin: Secondary | ICD-10-CM | POA: Diagnosis not present

## 2016-02-23 DIAGNOSIS — Z87828 Personal history of other (healed) physical injury and trauma: Secondary | ICD-10-CM | POA: Diagnosis not present

## 2016-02-23 DIAGNOSIS — D869 Sarcoidosis, unspecified: Secondary | ICD-10-CM

## 2016-02-23 DIAGNOSIS — R200 Anesthesia of skin: Secondary | ICD-10-CM | POA: Insufficient documentation

## 2016-02-23 DIAGNOSIS — D573 Sickle-cell trait: Secondary | ICD-10-CM | POA: Insufficient documentation

## 2016-02-23 DIAGNOSIS — Z87891 Personal history of nicotine dependence: Secondary | ICD-10-CM | POA: Insufficient documentation

## 2016-02-23 DIAGNOSIS — Z79899 Other long term (current) drug therapy: Secondary | ICD-10-CM | POA: Diagnosis not present

## 2016-02-23 DIAGNOSIS — R208 Other disturbances of skin sensation: Secondary | ICD-10-CM | POA: Diagnosis not present

## 2016-02-23 DIAGNOSIS — Z9889 Other specified postprocedural states: Secondary | ICD-10-CM | POA: Insufficient documentation

## 2016-02-23 DIAGNOSIS — J45909 Unspecified asthma, uncomplicated: Secondary | ICD-10-CM | POA: Diagnosis not present

## 2016-02-23 MED ORDER — METHOCARBAMOL 500 MG PO TABS: 500.0000 mg | ORAL_TABLET | Freq: Four times a day (QID) | ORAL | Status: DC | PRN

## 2016-02-23 MED ORDER — NAPROXEN 500 MG PO TABS: 500.0000 mg | ORAL_TABLET | Freq: Two times a day (BID) | ORAL | Status: DC

## 2016-02-23 MED FILL — METHOCARBAMOL 500 MG TABLET: 500 | 15 days supply | Qty: 60 | Fill #0

## 2016-02-23 MED FILL — NAPROXEN 500 MG TABLET: 500 | 30 days supply | Qty: 60 | Fill #0
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
## 2016-02-23 NOTE — Patient Instructions (Addendum)
PLEASE CALL ME WITH ANY PROBLEMS OR QUESTIONS (#[phone]).     YOU SHOULD RESTRICT YOUR LIFTING 25 LBS OR LESS

## 2016-03-02 ENCOUNTER — Ambulatory Visit (HOSPITAL_COMMUNITY)
Admission: RE | Admit: 2016-03-02 | Discharge: 2016-03-02 | Disposition: A | Discharge status: Home / Self Care | Payer: PRIVATE HEALTH INSURANCE | Source: Ambulatory Visit | Attending: Physical Medicine & Rehabilitation | Admitting: Physical Medicine & Rehabilitation

## 2016-03-02 DIAGNOSIS — M5416 Radiculopathy, lumbar region: Secondary | ICD-10-CM | POA: Insufficient documentation

## 2016-03-02 DIAGNOSIS — D869 Sarcoidosis, unspecified: Secondary | ICD-10-CM | POA: Diagnosis not present

## 2016-03-02 DIAGNOSIS — M5136 Other intervertebral disc degeneration, lumbar region: Secondary | ICD-10-CM | POA: Insufficient documentation

## 2016-03-02 DIAGNOSIS — R202 Paresthesia of skin: Secondary | ICD-10-CM | POA: Diagnosis not present

## 2016-03-02 DIAGNOSIS — M5126 Other intervertebral disc displacement, lumbar region: Secondary | ICD-10-CM | POA: Diagnosis not present

## 2016-03-02 DIAGNOSIS — R938 Abnormal findings on diagnostic imaging of other specified body structures: Secondary | ICD-10-CM | POA: Diagnosis not present

## 2016-03-02 DIAGNOSIS — R200 Anesthesia of skin: Secondary | ICD-10-CM

## 2016-03-03 ENCOUNTER — Other Ambulatory Visit: Payer: Self-pay

## 2016-03-03 DIAGNOSIS — M5136 Other intervertebral disc degeneration, lumbar region: Secondary | ICD-10-CM

## 2016-03-03 DIAGNOSIS — M5416 Radiculopathy, lumbar region: Secondary | ICD-10-CM

## 2016-03-03 MED ORDER — HYDROCODONE-ACETAMINOPHEN 5-325 MG PO TABS: 1.0000 | ORAL_TABLET | Freq: Four times a day (QID) | ORAL | Status: DC | PRN

## 2016-03-03 NOTE — Telephone Encounter (Signed)
HYDROcodone-acetaminophen (NORCO/VICODIN) 5-325   Patient is requesting a refill on his RX.  Also he wanted to make sure that you got his results for the CT, he goes back there on June 14th. He wanted to know if he needed to keep the app with you on the 14th as well or if he needs to come in soon. Please follow up. THank you.

## 2016-03-03 NOTE — Telephone Encounter (Signed)
Ok to fill. Pain med referral sent 5/3. Has not been called for appt yet

## 2016-03-04 NOTE — Telephone Encounter (Signed)
Pt informed in front cabinet

## 2016-03-05 ENCOUNTER — Telehealth: Payer: Self-pay | Admitting: Physical Medicine & Rehabilitation

## 2016-03-05 MED FILL — HYDROCODON-APAP 5-325: 5-325 | 18 days supply | Qty: 75 | Fill #0

## 2016-03-05 NOTE — Telephone Encounter (Signed)
I reviewed Mr. [name]'s MRI. Results are as follows: 1. Mild broad-based disc protrusion and facet hypertrophy at L4-5 resulting and mild subarticular narrowing, worse on the right. 2. Leftward disc bulge at L3-4 without significant stenosis. 3. T2 marrow changes in the pedicles bilaterally at L4-5 and L5-S1 is worse on the left. These are compatible with stress reactions. No definite fracture is present. CT of the lumbar spine may be useful for further evaluation of possible fracture.  At this point I would recommend physical therapy before pursuing any injections. Please contact him and find out how he would like to proceed.   thanks

## 2016-03-11 NOTE — Telephone Encounter (Signed)
Left message to return call 

## 2016-03-17 ENCOUNTER — Encounter: Payer: Self-pay | Admitting: Internal Medicine

## 2016-03-17 ENCOUNTER — Encounter: Payer: Self-pay | Admitting: Physical Medicine & Rehabilitation

## 2016-03-17 ENCOUNTER — Encounter: Payer: 59 | Attending: Physical Medicine & Rehabilitation | Admitting: Physical Medicine & Rehabilitation

## 2016-03-17 ENCOUNTER — Ambulatory Visit (INDEPENDENT_AMBULATORY_CARE_PROVIDER_SITE_OTHER)
Admission: RE | Admit: 2016-03-17 | Discharge: 2016-03-17 | Disposition: A | Discharge status: Home / Self Care | Payer: 59 | Source: Ambulatory Visit | Attending: Internal Medicine | Admitting: Internal Medicine

## 2016-03-17 ENCOUNTER — Ambulatory Visit (INDEPENDENT_AMBULATORY_CARE_PROVIDER_SITE_OTHER): Payer: 59 | Admitting: Internal Medicine

## 2016-03-17 VITALS — BP 128/77 | HR 109 | Resp 14

## 2016-03-17 VITALS — BP 122/86 | HR 85 | Temp 98.3°F | Resp 20 | Ht 69.5 in | Wt 146.0 lb

## 2016-03-17 DIAGNOSIS — M5136 Other intervertebral disc degeneration, lumbar region: Secondary | ICD-10-CM | POA: Diagnosis not present

## 2016-03-17 DIAGNOSIS — R0602 Shortness of breath: Secondary | ICD-10-CM | POA: Diagnosis not present

## 2016-03-17 DIAGNOSIS — R202 Paresthesia of skin: Secondary | ICD-10-CM | POA: Diagnosis not present

## 2016-03-17 DIAGNOSIS — D573 Sickle-cell trait: Secondary | ICD-10-CM | POA: Diagnosis not present

## 2016-03-17 DIAGNOSIS — Z87828 Personal history of other (healed) physical injury and trauma: Secondary | ICD-10-CM | POA: Insufficient documentation

## 2016-03-17 DIAGNOSIS — R05 Cough: Secondary | ICD-10-CM | POA: Diagnosis not present

## 2016-03-17 DIAGNOSIS — Z9889 Other specified postprocedural states: Secondary | ICD-10-CM | POA: Insufficient documentation

## 2016-03-17 DIAGNOSIS — Z87891 Personal history of nicotine dependence: Secondary | ICD-10-CM | POA: Insufficient documentation

## 2016-03-17 DIAGNOSIS — R059 Cough, unspecified: Secondary | ICD-10-CM | POA: Insufficient documentation

## 2016-03-17 DIAGNOSIS — D869 Sarcoidosis, unspecified: Secondary | ICD-10-CM | POA: Insufficient documentation

## 2016-03-17 DIAGNOSIS — J45909 Unspecified asthma, uncomplicated: Secondary | ICD-10-CM | POA: Diagnosis not present

## 2016-03-17 DIAGNOSIS — Z79899 Other long term (current) drug therapy: Secondary | ICD-10-CM | POA: Insufficient documentation

## 2016-03-17 DIAGNOSIS — J4550 Severe persistent asthma, uncomplicated: Secondary | ICD-10-CM | POA: Diagnosis not present

## 2016-03-17 DIAGNOSIS — R208 Other disturbances of skin sensation: Secondary | ICD-10-CM | POA: Diagnosis not present

## 2016-03-17 DIAGNOSIS — R200 Anesthesia of skin: Secondary | ICD-10-CM

## 2016-03-17 DIAGNOSIS — M5416 Radiculopathy, lumbar region: Secondary | ICD-10-CM | POA: Insufficient documentation

## 2016-03-17 MED ORDER — MOMETASONE FURO-FORMOTEROL FUM 200-5 MCG/ACT IN AERO: 2.0000 | INHALATION_SPRAY | Freq: Two times a day (BID) | RESPIRATORY_TRACT | Status: DC

## 2016-03-17 MED ORDER — CYCLOBENZAPRINE HCL 5 MG PO TABS: 5.0000 mg | ORAL_TABLET | Freq: Three times a day (TID) | ORAL | Status: DC | PRN

## 2016-03-17 MED ORDER — HYDROCODONE-ACETAMINOPHEN 5-325 MG PO TABS: 1.0000 | ORAL_TABLET | Freq: Four times a day (QID) | ORAL | Status: DC | PRN

## 2016-03-17 MED ORDER — METHYLPREDNISOLONE ACETATE 80 MG/ML IJ SUSP
120.0000 mg | Freq: Once | INTRAMUSCULAR | Status: AC
  Administered 2016-03-17: 120 mg via INTRAMUSCULAR

## 2016-03-17 NOTE — Progress Notes (Signed)
Subjective:HPI    Review of Systems      Objective:  Physical Exam
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
## 2016-03-17 NOTE — Telephone Encounter (Signed)
Patient was present for appointment 03-17-16

## 2016-03-17 NOTE — Progress Notes (Signed)
Pre visit review using our clinic review tool, if applicable. No additional management support is needed unless otherwise documented below in the visit note. 

## 2016-03-17 NOTE — Patient Instructions (Signed)

## 2016-03-17 NOTE — Progress Notes (Signed)
Subjective:    Patient ID: [name], male    DOB: 09/25/1966, 50 y.o.   MRN: [id number]  HPI   [name] is here in follow up of his back and right arm pain. His back pain seems to have gotten worse. It is primarily in the left low back with some radiation to the left thigh. It bothers him most with prolonged sitting, standing and walking. 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 I asked him to stay below 25lb with work but it sounds as if he's having to do more.   The MRI of his lumbar spine revealed:  L1-2 and L2-3: Normal  L3-4: A leftward disc bulge is present without focal stenosis. Mild facet hypertrophy is noted.  L4-5: Moderate facet hypertrophy is slightly worse on the right. A broad-based disc protrusion is present. Mild subarticular narrowing is worse on the right. The foramina are patent.  The naproxen hasn't helped. The robaxin made him too sleepy. He uses a vicodin occasionally for pain. He is not using any heat,ice, or stretching.  Pain Inventory Average Pain 9 Pain Right Now 9 My pain is sharp, stabbing, tingling and aching  In the last 24 hours, has pain interfered with the following? General activity 7 Relation with others 4 Enjoyment of life 10 What TIME of day is your pain at its worst? all Sleep (in general) Poor  Pain is worse with: walking, bending, sitting, standing and some activites Pain improves with: medication Relief from Meds: 2  Mobility walk without assistance ability to climb steps?  no do you drive?  yes  Function employed # of hrs/week 40  Neuro/Psych tingling spasms  Prior Studies Any changes since last visit?  no CT/MRI  Physicians involved in your care Any changes since last visit?  no   Family History  Problem Relation Age of Onset  . Asthma Mother   . Coronary artery disease Mother   . Stroke Mother   . Heart attack Mother   . Sickle cell trait Mother   . Prostate cancer Father    . Cancer Father [id number]    prostate  . Alcohol abuse Brother   . Early death Brother   . Drug abuse Brother   . Diabetes Neg Hx   . Heart disease Neg Hx   . Hyperlipidemia Neg Hx   . Hypertension Neg Hx   . Kidney disease Neg Hx    Social History   Social History  . Marital Status: Married    Spouse Name: N/A  . Number of Children: N/A  . Years of Education: N/A   Social History Main Topics  . Smoking status: Former Smoker -- 0.50 packs/day for 10 years    Quit date: 10/04/1993  . Smokeless tobacco: Never Used  . Alcohol Use: 28.8 oz/week    3 Standard drinks or equivalent, 25 Shots of liquor, 20 Cans of beer per week  . Drug Use: No  . Sexual Activity: Yes   Other Topics Concern  . None   Social History Narrative   Past Surgical History  Procedure Laterality Date  . Shoulder open rotator cuff repair  2007  . I&d extremity  01/19/2012    Procedure: IRRIGATION AND DEBRIDEMENT EXTREMITY;  Surgeon: Nadara MustardMarcus V Duda, MD;  Location: [facility] OR;  Service: Orthopedics;  Laterality: Right;   Past Medical History  Diagnosis Date  . Asthma   . Sarcoidosis (HCC)   . Sickle cell trait (  HCC)   . Sickle cell anemia (HCC)   . Sarcoidosis (HCC)    BP 128/77 mmHg  Pulse 109  Resp 14  SpO2 96%  Opioid Risk Score:   Fall Risk Score:  `1  Depression screen PHQ 2/9  Depression screen PHQ 2/9 02/23/2016  Decreased Interest 1  Down, Depressed, Hopeless 0  PHQ - 2 Score 1  Altered sleeping 2  Tired, decreased energy 2  Change in appetite 0  Feeling bad or failure about yourself  0  Trouble concentrating 0  Moving slowly or fidgety/restless 2  Suicidal thoughts 0  PHQ-9 Score 7  Difficult doing work/chores Somewhat difficult     Review of Systems  All other systems reviewed and are negative.      Objective:   Physical Exam  General: Alert and oriented x 3, No apparent distress  HEENT: Head is normocephalic, atraumatic, PERRLA, EOMI, sclera anicteric, oral mucosa pink and  moist, dentition intact, ext ear canals clear,  Neck: Supple without JVD or lymphadenopathy  Heart: Reg rate and rhythm. No murmurs rubs or gallops  Chest: CTA bilaterally without wheezes, rales, or rhonchi; no distress  Abdomen: Soft, non-tender, non-distended, bowel sounds positive.  Extremities: No clubbing, cyanosis, or edema. Pulses are 2+  Skin: Clean and intact without signs of breakdown  Neuro: Pt is cognitively appropriate with normal insight, memory, and awareness. Cranial nerves 2-12 are intact. Sensory exam is normal except subjectively at the palmar thumb and ring finger. Tinel's remains equivocal to negative at right wrist and elbow. Reflexes are 2+ in all 4's. Fine motor coordination is intact. No tremors. Motor function is grossly 5/5 except for pain inhibition at the right shoulder and left hip.  Musculoskeletal: Bilateral lumbar paraspinals are tight. He has difficulty moving in all planes. any movement tended to exacerbate left low back pain. SLR and SST only precipitated back pain. Extension provocation exacerbated his back pian the most as did facet maneuvers. Pelvic positioning fairly equal. Gait was well balanced and only mild antalgia noted on left. Spurling's negative  Psych: Pt's affect is appropriate. Pt is cooperative    Assessment & Plan:   1. Lumbar spondylosis with DDD and Facet arthropathy. His exam is most consistent with facet-mediated pain. His MRI is not overwhelming. The majority of his pain is left/axial and the referred pain in his left leg is not consistent with a true radiculopathy on exam.  2. Right hand dysesthesias. ?Carpal Tunnel Syndrome?--exam findings inconsistent  3. Right rotator cuff injury with repair  4. Remote hx of right foot injury/near toe amputations    Plan:  1. Refer to outpt PT for low back strengthening, lengthening, modalities, HEP 2. Continue naproxen  BID  3. Flexeril  for spasms --hopefully this will be less sedating.  Asked him to use heat and ice also. 4. Change to light duty with rest breaks every 2 hours. If unable to tolerate this, will need to pull from work all together until symptoms subside 5. Arrange NCS/EMG RUE  6.Refilled his hydrocodone 5/325 for breakthrough pain.  7. Follow up in about a month. Twenty-five minutes of face to face patient care time were spent during this visit. All questions were encouraged and answered.

## 2016-03-17 NOTE — Patient Instructions (Signed)
Light duty work only until you follow up with me next month

## 2016-03-21 MED FILL — HYDROCODON-APAP 5-325: 5-325 | 18 days supply | Qty: 75 | Fill #0

## 2016-04-08 MED FILL — HYDROCODON-APAP 5-325: 5-325 | 30 days supply | Qty: 60 | Fill #0

## 2016-04-13 ENCOUNTER — Ambulatory Visit: Payer: PRIVATE HEALTH INSURANCE | Attending: Orthopedic Surgery

## 2016-04-13 DIAGNOSIS — R252 Cramp and spasm: Secondary | ICD-10-CM | POA: Diagnosis present

## 2016-04-13 DIAGNOSIS — M545 Low back pain: Secondary | ICD-10-CM | POA: Insufficient documentation

## 2016-04-13 NOTE — Patient Instructions (Signed)
Knee to Chest    Lying supine, bend involved knee to chest 3-5___ times. Repeat with other leg.   10-20 sec stretch Do __3Rotation: Lumbar Cranial - Side-Lying    Lie on right side, hips and knees bent 90, neck supported. Lower top arm behind, breathing out. Hold for _3-5___ breaths. Repeat ___3_ times per set. Do _1___ sets per session. Do __3__ sessions per week.   RIGHT AND LEFTprone on elbow On Elbows (Prone)    Rise up on elbows as high as possible, keeping hips on floor. Hold 30-60____ seconds. Repeat _3___ times per set. Do _2___ sets per session. Do __3__ sessions per day.  http://[email or web address].exer.[email or web address]   Copyright  VHI. All rights reserved.    Copyright  VHI. All rights reserved.  _ times per day.  Copyright  VHI. All rights reserved.  TRY HEAT

## 2016-04-13 NOTE — Therapy (Signed)
[facility] Outpatient Rehabilitation [facility] [address] [place], [place], [address] Phone: [phone]   Fax:  [phone]  Physical Therapy Evaluation  Patient Details  Name: [name] MRN: [id number] Date of Birth: [birth year] Referring Provider: Gean Birchwood , MD    Faith Rogue, MD  Encounter Date: 04/13/2016      PT End of Session - 04/13/16 1453    Visit Number 1   Number of Visits 12   Date for PT Re-Evaluation 05/28/16   Authorization Type workers compensation   Authorization - Visit Number 1   Authorization - Number of Visits 12   PT Start Time 0215   PT Stop Time 0310   PT Time Calculation (min) 55 min   Activity Tolerance Patient tolerated treatment well   Behavior During Therapy [facility] for tasks assessed/performed      Past Medical History  Diagnosis Date  . Asthma   . Sarcoidosis (HCC)   . Sickle cell trait (HCC)   . Sickle cell anemia (HCC)   . Sarcoidosis [facility])     Past Surgical History  Procedure Laterality Date  . Shoulder open rotator cuff repair  2007  . I&d extremity  01/19/2012    Procedure: IRRIGATION AND DEBRIDEMENT EXTREMITY;  Surgeon: Nadara Mustard, MD;  Location: [facility] OR;  Service: Orthopedics;  Laterality: Right;    There were no vitals filed for this visit.       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  He is now on light duty  running T3 on floors. 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    Pertinent History labrum repair on RT shoulder.    Limitations --  Not able to do normal work activity and leisure activity  and sleep is disturbed   How long can you sit comfortably? 15 min   How long can you stand comfortably? 20 min   How long can you walk comfortably? 200 feet   Diagnostic tests MRI:  worse on LT side of lower back   Patient Stated Goals To get back 100%    Currently in Pain? Yes   Pain Score 7    Pain Location Back   Pain Orientation Lower;Posterior;Right;Left   Pain Descriptors / Indicators Sharp;Shooting;Aching   Pain Type Acute pain   Pain Radiating Towards Lt leg anterior thigh   Pain Onset More than a month ago   Pain Frequency Constant   Aggravating Factors  activity and static postitions   Pain Relieving Factors heat, medication   Multiple Pain Sites No            [facility] PT Assessment - 04/13/16 1413    Assessment   Medical Diagnosis lumbar radiculopathy, facet arthropathy   Referring Provider Gean Birchwood , MD    Faith Rogue, MD   Onset Date/Surgical Date --  LBP for 6 months then Feb 12, 2016 he was stripping floor,   Hand Dominance Right   Next MD Visit 04/29/16   Prior Therapy No   Precautions   Precautions None;Other (comment)   Precaution Comments lifting limit of 8 pounds, light duty for work   Restrictions   Weight Bearing Restrictions No   Balance Screen   Has the patient fallen in the past 6 months Yes   How many times? 5  stripping floor at work   Has  the patient had a decrease in activity level because of a fear of falling?  Yes   Is the patient reluctant to leave their home because of a fear of falling?  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,  change curtains in patient rooms,  , clean carpet   Cognition   Overall Cognitive Status Within Functional Limits for tasks assessed   Posture/Postural Control   Posture/Postural Control No significant limitations   ROM / Strength   AROM / PROM / Strength Strength;AROM   AROM   AROM Assessment Site Lumbar   Lumbar Flexion he can reacch mid tibia   Lumbar Extension 20   Lumbar - Right Side Bend 10   Lumbar - Left Side Bend 15   Lumbar - Right Rotation --  passive WNL    Lumbar - Left Rotation --  WNL   Strength   Overall Strength Comments WNL in LE with some cogwheeling   Flexibility   Soft Tissue Assessment /Muscle Length yes   Hamstrings 45-50 degrees bilateral    Palpation   SI assessment  posterior PSIS    Palpation comment muscle guardiing and tenderness bilateral paraspinals and quads  and quadratus   Ambulation/Gait   Gait Comments WNL                   [facility] Adult PT Treatment/Exercise - 04/13/16 1413    Modalities   Modalities Electrical Stimulation;Moist Heat;Ultrasound   Moist Heat Therapy   Number Minutes Moist Heat 18 Minutes   Moist Heat Location Lumbar Spine   Electrical Stimulation   Electrical Stimulation Location lower back   Electrical Stimulation Action IFC   Electrical Stimulation Parameters L15   Electrical Stimulation Goals Pain                PT Education - 04/13/16 1459    Education provided Yes   Education Details POC, HEP   Person(s) Educated Patient   Methods Explanation;Verbal cues;Handout   Comprehension Returned demonstration;Verbalized understanding          PT Short Term Goals - 04/13/16 1411    PT SHORT TERM GOAL #1   Title He will be independent with inital HEP   Time 2   Period Weeks   Status New   PT SHORT TERM GOAL #2   Title He wil report pain decreased 25% or more in leg   Time 3   Period Weeks   Status New   PT SHORT TERM GOAL #3   Title He will report improved tolerance for walking , standing and sitting with pain  decr 35% or more and increased to > 30 min for each   Time 3   Period Weeks   Status New   PT SHORT TERM GOAL #4   Title PAssive knee flexion prone full with min to no quad pain RT and LT   Time 3   Period Weeks   Status New   PT SHORT TERM GOAL #5   Title He will walk 500 feet or more without increased pain   Time 3   Period Weeks   Status New           PT Long Term Goals - 04/13/16 1412    PT LONG TERM GOAL #1  Title He will be  independent with all HEP issued   Time 6   Period Weeks   Status New   PT LONG TERM GOAL #2   Title He will report pain in leg intermittant and decr 75% or more   Time 6   Period Weeks   Status New   PT LONG TERM GOAL #3   Title He will be able to touch his toes with min to no pain to allow for bending at work   Time 6   Period Weeks   Status New   PT LONG TERM GOAL #4   Title He will be able to stand at work for 60 min or more without increased pain   Time 6   Period Weeks   Status New   PT LONG TERM GOAL #5   Title He will be able to sit for 45-60 min without incr pain   Time 6   Period Weeks   Status New   Additional Long Term Goals   Additional Long Term Goals Yes   PT LONG TERM GOAL #6   Title He will be able to lift 30 pounds x10 floor to waist with 1-2/10 pain to begin to tolerate lifting at work   Time 6   Period Weeks   Status New               Plan - 04/13/16 1410    Clinical Impression Statement Mr Willeen CassBennett presents for moderate complexity evaluation after slipping while cleaning floor with buffer and sustained back injury with pain , trunk stiffness ,spasms, limiting ability to perform normal home and work tasks He should improve with PT  He may need more than 6 weeks to RTW   Rehab Potential Good   PT Frequency 2x / week   PT Duration 6 weeks   PT Treatment/Interventions Cryotherapy;Electrical Stimulation;Iontophoresis 4mg /ml Dexamethasone;Moist Heat;Ultrasound;Therapeutic exercise;Patient/family education;Passive range of motion;Dry needling;Manual techniques;Taping   PT Next Visit Plan Stabilization exercises , modalities for pain releif,  manual   PT Home Exercise Plan knee to chest, LTR, prone lye   Consulted and Agree with Plan of Care Patient      Patient will benefit from skilled therapeutic intervention in order to improve the following deficits and impairments:  Pain, Postural dysfunction, Increased muscle spasms, Decreased activity tolerance,  Decreased range of motion  Visit Diagnosis: Cramp and spasm  Bilateral low back pain, with sciatica presence unspecified     Problem List Patient Active Problem List   Diagnosis Date Noted  . Cough 03/17/2016  . Numbness and tingling in right hand 02/23/2016  . Left lumbar radiculitis 02/04/2016  . Routine general medical examination at a health care facility 12/31/2015  . Degenerative disc disease, lumbar 12/31/2015  . Alcohol abuse 01/20/2009  . ALLERGIC RHINITIS 01/20/2009  . Severe persistent chronic asthma without complication 01/02/2009  . Sarcoidosis (HCC) 11/10/2007    Caprice RedChasse, Imari Reen M PT 04/13/2016, 3:21 PM  [facility] [address] [place], [place], [address] Phone: [phone]   Fax:  [phone]  Name: [name] MRN: [id number] Date of Birth: 02-May-1966

## 2016-04-20 ENCOUNTER — Ambulatory Visit: Payer: PRIVATE HEALTH INSURANCE | Attending: Physical Medicine & Rehabilitation

## 2016-04-20 DIAGNOSIS — R252 Cramp and spasm: Secondary | ICD-10-CM | POA: Insufficient documentation

## 2016-04-20 DIAGNOSIS — M545 Low back pain: Secondary | ICD-10-CM | POA: Insufficient documentation

## 2016-04-20 NOTE — Therapy (Signed)
[facility] [address] [place], [place], [address] Phone: [phone]   Fax:  [phone]  Physical Therapy Treatment  Patient Details  Name: [name] MRN: [id number] Date of Birth: Apr 15, 1966 Referring Provider: Gean BirchwoodFrank Rowan , MD    Faith RogueZachary Swartz, MD  Encounter Date: 04/20/2016      PT End of Session - 04/20/16 1454    Visit Number 2   Number of Visits 12   Date for PT Re-Evaluation 05/28/16   Authorization Type workers compensation   Authorization - Visit Number 2   Authorization - Number of Visits 12   PT Start Time 0252   PT Stop Time 0350   PT Time Calculation (min) 58 min   Activity Tolerance Patient tolerated treatment well;Patient limited by pain   Behavior During Therapy [facility] for tasks assessed/performed      Past Medical History  Diagnosis Date  . Asthma   . Sarcoidosis (HCC)   . Sickle cell trait (HCC)   . Sickle cell anemia (HCC)   . Sarcoidosis [facility](HCC)     Past Surgical History  Procedure Laterality Date  . Shoulder open rotator cuff repair  2007  . I&d extremity  01/19/2012    Procedure: IRRIGATION AND DEBRIDEMENT EXTREMITY;  Surgeon: Nadara MustardMarcus V Duda, MD;  Location: [facility] OR;  Service: Orthopedics;  Laterality: Right;    There were no vitals filed for this visit.      Subjective Assessment - 04/20/16 1459    Subjective Hanging in .  Leg pain still bothering him. Doing exercises.    Currently in Pain? Yes   Pain Score 7    Pain Location Back   Pain Orientation Right;Left;Lower;Posterior   Pain Descriptors / Indicators Sharp;Aching;Shooting   Pain Type Acute pain   Pain Onset More than a month ago   Pain Frequency Constant   Aggravating Factors  activity and static positions   Pain Relieving Factors meds , heat   Multiple Pain Sites No                         [facility] Adult PT Treatment/Exercise - 04/20/16 1455    Lumbar Exercises: Stretches   Single Knee to Chest Stretch 2  reps;20 seconds  RT and LT    Double Knee to Chest Stretch 2 reps;30 seconds   Pelvic Tilt Limitations 15 reps with cues  tactile and manual for technique.  and also side to sid epaelvic tilt x 15  both followed bu bilateral knee to chest 15-25 sec.    Lumbar Exercises: Aerobic   Stationary Bike Nustep L3  5 min  UE and LE   Lumbar Exercises: Supine   Clam 10 reps   Clam Limitations single leg RT and LT    Other Supine Lumbar Exercises ball squeeze x 12    Lumbar Exercises: Sidelying   Clam 10 reps  RT and LT    Moist Heat Therapy   Number Minutes Moist Heat 18 Minutes   Moist Heat Location Lumbar Spine   Electrical Stimulation   Electrical Stimulation Location lower back   Electrical Stimulation Action IFC   Electrical Stimulation Parameters L13   Electrical Stimulation Goals Pain   Manual Therapy   Manual Therapy Manual Traction;Joint mobilization;Soft tissue mobilization   Joint Mobilization PA gr 2 L1-5 and sacrum    Soft tissue mobilization to both quadratus RT and LT and fascial release to central and diagonals    Manual  Traction Both legs in supine 30 sec x 5                   PT Short Term Goals - 04/13/16 1411    PT SHORT TERM GOAL #1   Title He will be independent with inital HEP   Time 2   Period Weeks   Status New   PT SHORT TERM GOAL #2   Title He wil report pain decreased 25% or more in leg   Time 3   Period Weeks   Status New   PT SHORT TERM GOAL #3   Title He will report improved tolerance for walking , standing and sitting with pain  decr 35% or more and increased to > 30 min for each   Time 3   Period Weeks   Status New   PT SHORT TERM GOAL #4   Title PAssive knee flexion prone full with min to no quad pain RT and LT   Time 3   Period Weeks   Status New   PT SHORT TERM GOAL #5   Title He will walk 500 feet or more without increased pain   Time 3   Period Weeks   Status New           PT Long Term Goals - 04/13/16 1412    PT LONG  TERM GOAL #1   Title He will be independent with all HEP issued   Time 6   Period Weeks   Status New   PT LONG TERM GOAL #2   Title He will report pain in leg intermittant and decr 75% or more   Time 6   Period Weeks   Status New   PT LONG TERM GOAL #3   Title He will be able to touch his toes with min to no pain to allow for bending at work   Time 6   Period Weeks   Status New   PT LONG TERM GOAL #4   Title He will be able to stand at work for 60 min or more without increased pain   Time 6   Period Weeks   Status New   PT LONG TERM GOAL #5   Title He will be able to sit for 45-60 min without incr pain   Time 6   Period Weeks   Status New   Additional Long Term Goals   Additional Long Term Goals Yes   PT LONG TERM GOAL #6   Title He will be able to lift 30 pounds x10 floor to waist with 1-2/10 pain to begin to tolerate lifting at work   Time 6   Period Weeks   Status New               Plan - 04/20/16 1454    Clinical Impression Statement All activity cause some pain . He did bette rat breathing and relaxing with stretching but still use whole body at times to express  level of pain.  Continue manual and exercise to see if he can decr pain   PT Treatment/Interventions Cryotherapy;Electrical Stimulation;Iontophoresis /ml Dexamethasone;Moist Heat;Ultrasound;Therapeutic exercise;Patient/family education;Passive range of motion;Dry needling;Manual techniques;Taping   PT Next Visit Plan Stabilization exercises , modalities for pain releif,  manual   Consulted and Agree with Plan of Care Patient      Patient will benefit from skilled therapeutic intervention in order to improve the following deficits and impairments:  Pain, Postural dysfunction, Increased muscle spasms, Decreased activity  tolerance, Decreased range of motion  Visit Diagnosis: Cramp and spasm  Bilateral low back pain, with sciatica presence unspecified     Problem List Patient Active Problem  List   Diagnosis Date Noted  . Cough 03/17/2016  . Numbness and tingling in right hand 02/23/2016  . Left lumbar radiculitis 02/04/2016  . Routine general medical examination at a health care facility 12/31/2015  . Degenerative disc disease, lumbar 12/31/2015  . Alcohol abuse 01/20/2009  . ALLERGIC RHINITIS 01/20/2009  . Severe persistent chronic asthma without complication 01/02/2009  . Sarcoidosis (HCC) 11/10/2007    Caprice Red PT 04/20/2016, 3:38 PM  [facility] [address] [place], [place], [address] Phone: [phone]   Fax:  [phone]  Name: [name] MRN: [id number] Date of Birth: 09/15/1966

## 2016-04-22 ENCOUNTER — Ambulatory Visit: Payer: PRIVATE HEALTH INSURANCE

## 2016-04-22 ENCOUNTER — Telehealth: Payer: Self-pay | Admitting: Physical Medicine & Rehabilitation

## 2016-04-22 DIAGNOSIS — R252 Cramp and spasm: Secondary | ICD-10-CM

## 2016-04-22 DIAGNOSIS — M5136 Other intervertebral disc degeneration, lumbar region: Secondary | ICD-10-CM

## 2016-04-22 DIAGNOSIS — M545 Low back pain: Secondary | ICD-10-CM

## 2016-04-22 DIAGNOSIS — M5416 Radiculopathy, lumbar region: Secondary | ICD-10-CM

## 2016-04-22 NOTE — Patient Instructions (Signed)
Issued from cabinet prone pelvic lift RT and LT x15  2x/day

## 2016-04-22 NOTE — Therapy (Signed)
[facility] [address] [place], [place], [address] Phone: [phone]   Fax:  [phone]  Physical Therapy Treatment  Patient Details  Name: [name] MRN: [id number] Date of Birth: Mar 01, 1966 Referring Provider: Gean Birchwood , MD    Faith Rogue, MD  Encounter Date: 04/22/2016      PT End of Session - 04/22/16 1146    Visit Number 3   Number of Visits 12   Date for PT Re-Evaluation 05/28/16   Authorization Type workers compensation   PT Start Time 1145   PT Stop Time 1245   PT Time Calculation (min) 60 min   Activity Tolerance Patient tolerated treatment well;Patient limited by pain   Behavior During Therapy [facility] for tasks assessed/performed      Past Medical History  Diagnosis Date  . Asthma   . Sarcoidosis (HCC)   . Sickle cell trait (HCC)   . Sickle cell anemia (HCC)   . Sarcoidosis [facility])     Past Surgical History  Procedure Laterality Date  . Shoulder open rotator cuff repair  2007  . I&d extremity  01/19/2012    Procedure: IRRIGATION AND DEBRIDEMENT EXTREMITY;  Surgeon: Nadara Mustard, MD;  Location: [facility] OR;  Service: Orthopedics;  Laterality: Right;    There were no vitals filed for this visit.      Subjective Assessment - 04/22/16 1151    Subjective Doing some better but leg and hip on LT bothering alot. NCV to hand next week. See MD's next week. Going to torchure me today?   Currently in Pain? Yes   Pain Score 7    Pain Location Back   Pain Orientation Left;Right;Lower;Posterior   Pain Descriptors / Indicators Spasm;Sharp;Aching   Pain Type Acute pain   Pain Radiating Towards LT leg ant thigh   Pain Onset More than a month ago   Pain Frequency Constant   Aggravating Factors  activity   Pain Relieving Factors meds heat   Multiple Pain Sites No                         [facility] Adult PT Treatment/Exercise - 04/22/16 1146    Lumbar Exercises: Stretches   Single Knee to Chest  Stretch 2 reps;20 seconds  RT ,LT   Pelvic Tilt Limitations 15 reps with cues  tactile and manual for technique.  and also side to side pelvic tilt x 15  both  in prone for multifidus      Prone on Elbows Stretch 60 seconds;2 reps   Lumbar Exercises: Aerobic   Stationary Bike Nustep L3  5 min  UE and LE   Moist Heat Therapy   Number Minutes Moist Heat 15 Minutes   Electrical Stimulation   Electrical Stimulation Location lower back   Electrical Stimulation Action IFC   Electrical Stimulation Parameters L12   Electrical Stimulation Goals Pain   Manual Therapy   Joint Mobilization PA gr 2 L1-5 and sacrum    Soft tissue mobilization to both quadratus RT and LT and fascial release to central and diagonals    Manual Traction LT leg in sidelye 30 sec x 5 then quad stretch RT and LT x 3 20 sec each                PT Education - 04/22/16 1225    Education provided Yes   Education Details multifidus, transvers abdominus   Person(s) Educated Patient   Methods Explanation;Tactile cues;Verbal  cues;Handout   Comprehension Returned demonstration;Verbalized understanding          PT Short Term Goals - 04/22/16 1227    PT SHORT TERM GOAL #1   Title He will be independent with inital HEP   Status On-going   PT SHORT TERM GOAL #2   Title He wil report pain decreased 25% or more in leg   Status On-going   PT SHORT TERM GOAL #3   Title He will report improved tolerance for walking , standing and sitting with pain  decr 35% or more and increased to > 30 min for each   Status On-going   PT SHORT TERM GOAL #4   Title PAssive knee flexion prone full with min to no quad pain RT and LT   Status On-going   PT SHORT TERM GOAL #5   Title He will walk 500 feet or more without increased pain   Status On-going           PT Long Term Goals - 04/13/16 1412    PT LONG TERM GOAL #1   Title He will be independent with all HEP issued   Time 6   Period Weeks   Status New   PT LONG TERM  GOAL #2   Title He will report pain in leg intermittant and decr 75% or more   Time 6   Period Weeks   Status New   PT LONG TERM GOAL #3   Title He will be able to touch his toes with min to no pain to allow for bending at work   Time 6   Period Weeks   Status New   PT LONG TERM GOAL #4   Title He will be able to stand at work for 60 min or more without increased pain   Time 6   Period Weeks   Status New   PT LONG TERM GOAL #5   Title He will be able to sit for 45-60 min without incr pain   Time 6   Period Weeks   Status New   Additional Long Term Goals   Additional Long Term Goals Yes   PT LONG TERM GOAL #6   Title He will be able to lift 30 pounds x10 floor to waist with 1-2/10 pain to begin to tolerate lifting at work   Time 6   Period Weeks   Status New               Plan - 04/22/16 1225    Clinical Impression Statement Still has difficulty doing exercise  except for stretches. He reacts less with complaint of pain and "locking up" in LT lower back.    PT Treatment/Interventions Cryotherapy;Electrical Stimulation;Iontophoresis 4mg /ml Dexamethasone;Moist Heat;Ultrasound;Therapeutic exercise;Patient/family education;Passive range of motion;Dry needling;Manual techniques;Taping   PT Next Visit Plan Stabilization exercises , modalities for pain relief,  manual   PT Home Exercise Plan mutifidus contraction   Consulted and Agree with Plan of Care Patient      Patient will benefit from skilled therapeutic intervention in order to improve the following deficits and impairments:  Pain, Postural dysfunction, Increased muscle spasms, Decreased activity tolerance, Decreased range of motion  Visit Diagnosis: Cramp and spasm  Bilateral low back pain, with sciatica presence unspecified     Problem List Patient Active Problem List   Diagnosis Date Noted  . Cough 03/17/2016  . Numbness and tingling in right hand 02/23/2016  . Left lumbar radiculitis 02/04/2016  .  Routine general  medical examination at a health care facility 12/31/2015  . Degenerative disc disease, lumbar 12/31/2015  . Alcohol abuse 01/20/2009  . ALLERGIC RHINITIS 01/20/2009  . Severe persistent chronic asthma without complication 01/02/2009  . Sarcoidosis (HCC) 11/10/2007    Caprice Red  PT 04/22/2016, 12:29 PM  [facility] [address] [place], [place], [address] Phone: [phone]   Fax:  [phone]  Name: [name] MRN: [id number] Date of Birth: 08/23/1966

## 2016-04-22 NOTE — Telephone Encounter (Signed)
I checked and Mr [name] received hydrocodone from Dr Riley KillSwartz on 03/17/16 #75 and this was filled 03/21/16 according to [facility].  He also got #60 from Dr Turner Danielsowan filled on 04/08/16.  I spoke with him and explained he is not to get pain meds from 2 different MDs.  He says he did not know that. He will need to sign a CSA if Dr Riley KillSwartz is going to continue to prescribe.  He has an appt 04/28/16 with Dr Riley KillSwartz.

## 2016-04-22 NOTE — Telephone Encounter (Signed)
Patient is needing a refill on pain medication.  Please call him at [phone].

## 2016-04-23 NOTE — Telephone Encounter (Signed)
No meds without CSA

## 2016-04-23 NOTE — Telephone Encounter (Signed)
Noted on schedule note next visit NEEDS CSA.

## 2016-04-27 ENCOUNTER — Ambulatory Visit: Payer: PRIVATE HEALTH INSURANCE | Admitting: Physical Therapy

## 2016-04-27 DIAGNOSIS — M545 Low back pain: Secondary | ICD-10-CM

## 2016-04-27 DIAGNOSIS — R252 Cramp and spasm: Secondary | ICD-10-CM | POA: Diagnosis not present

## 2016-04-27 NOTE — Therapy (Signed)
[facility] [address] [place], [place], [address] Phone: [phone]   Fax:  [phone]  Physical Therapy Treatment  Patient Details  Name: [name] MRN: [id number] Date of Birth: [birth year] Referring Provider: Gean Birchwood , MD    Faith Rogue, MD  Encounter Date: 04/27/2016      PT End of Session - 04/27/16 1524    Visit Number 4   Number of Visits 12   Date for PT Re-Evaluation 05/28/16   Authorization Type workers compensation   PT Start Time 0215   PT Stop Time 0315   PT Time Calculation (min) 60 min      Past Medical History:  Diagnosis Date  . Asthma   . Sarcoidosis (HCC)   . Sarcoidosis (HCC)   . Sickle cell anemia (HCC)   . Sickle cell trait [facility])     Past Surgical History:  Procedure Laterality Date  . I&D EXTREMITY  01/19/2012   Procedure: IRRIGATION AND DEBRIDEMENT EXTREMITY;  Surgeon: Nadara Mustard, MD;  [facility] OR;  Service: Orthopedics;  Laterality: Right;  . SHOULDER OPEN ROTATOR CUFF REPAIR  2007    There were no vitals filed for this visit.      Subjective Assessment - 04/27/16 1428    Currently in Pain? Yes   Pain Score 8    Pain Location Back   Pain Orientation Left;Lower   Pain Radiating Towards Lt leg and thigh                         [facility] Adult PT Treatment/Exercise - 04/27/16 0001      Lumbar Exercises: Stretches   Prone on Elbows Stretch 60 seconds;2 reps     Lumbar Exercises: Standing   Other Standing Lumbar Exercises Right hip press into wall 5 sec x 5     Lumbar Exercises: Prone   Straight Leg Raise 10 reps   Straight Leg Raises Limitations alternating with Tra contract   Other Prone Lumbar Exercises Prone right lateral shift of pelvis- abolished leg pain   Other Prone Lumbar Exercises Prone Tra contract requires max cues for correct technique , added h/s curls x 10 each     Electrical Stimulation   Electrical Stimulation Location low  back   Electrical Stimulation Action IFC   Electrical Stimulation Parameters L 12   Electrical Stimulation Goals Pain                  PT Short Term Goals - 04/22/16 1227      PT SHORT TERM GOAL #1   Title He will be independent with inital HEP   Status On-going     PT SHORT TERM GOAL #2   Title He wil report pain decreased 25% or more in leg   Status On-going     PT SHORT TERM GOAL #3   Title He will report improved tolerance for walking , standing and sitting with pain  decr 35% or more and increased to > 30 min for each   Status On-going     PT SHORT TERM GOAL #4   Title PAssive knee flexion prone full with min to no quad pain RT and LT   Status On-going     PT SHORT TERM GOAL #5   Title He will walk 500 feet or more without increased pain   Status On-going           PT Long Term Goals -  04/13/16 1412      PT LONG TERM GOAL #1   Title He will be independent with all HEP issued   Time 6   Period Weeks   Status New     PT LONG TERM GOAL #2   Title He will report pain in leg intermittant and decr 75% or more   Time 6   Period Weeks   Status New     PT LONG TERM GOAL #3   Title He will be able to touch his toes with min to no pain to allow for bending at work   Time 6   Period Weeks   Status New     PT LONG TERM GOAL #4   Title He will be able to stand at work for 60 min or more without increased pain   Time 6   Period Weeks   Status New     PT LONG TERM GOAL #5   Title He will be able to sit for 45-60 min without incr pain   Time 6   Period Weeks   Status New     Additional Long Term Goals   Additional Long Term Goals Yes     PT LONG TERM GOAL #6   Title He will be able to lift 30 pounds x10 floor to waist with 1-2/10 pain to begin to tolerate lifting at work   Time 6   Period Weeks   Status New               Plan - 04/27/16 1453    Clinical Impression Statement Pt reports decreased lumbar pain since beginning PT however  no change to side and leg pain. Began in prone on elbows with no change in leg pain. After shifting pelvis laterally to the right pt reports abolishment of left leg pain and centralization in low back. Worked on prone Tra contraction with pt requiring max cues. Upon standing leg pain returned. Right hip press into wall again abolished radicular pain. Encouraged pt to try right lateral shift in prone and standing for HEP when leg pain present. Repeated IFC and HMP as pt reports this is helpful. Primary PT may consider adding mechanical traction to plan if pt guarding decreases.    PT Next Visit Plan Stabilization exercises , modalities for pain relief,  manual, check radicualr pain and if right shift is helping?      Patient will benefit from skilled therapeutic intervention in order to improve the following deficits and impairments:  Pain, Postural dysfunction, Increased muscle spasms, Decreased activity tolerance, Decreased range of motion  Visit Diagnosis: Cramp and spasm  Bilateral low back pain, with sciatica presence unspecified     Problem List Patient Active Problem List   Diagnosis Date Noted  . Cough 03/17/2016  . Numbness and tingling in right hand 02/23/2016  . Left lumbar radiculitis 02/04/2016  . Routine general medical examination at a health care facility 12/31/2015  . Degenerative disc disease, lumbar 12/31/2015  . Alcohol abuse 01/20/2009  . ALLERGIC RHINITIS 01/20/2009  . Severe persistent chronic asthma without complication 01/02/2009  . Sarcoidosis (HCC) 11/10/2007    Sherrie Mustache, PTA 04/27/2016, 3:45 PM  [facility] [address] [place], [place], [address] Phone: [phone]   Fax:  [phone]  Name: Harol Shabazz MRN: [id number] Date of Birth: [birth year]

## 2016-04-28 ENCOUNTER — Encounter: Payer: PRIVATE HEALTH INSURANCE | Admitting: Physical Medicine & Rehabilitation

## 2016-04-29 ENCOUNTER — Ambulatory Visit: Payer: PRIVATE HEALTH INSURANCE | Admitting: Physical Therapy

## 2016-05-04 ENCOUNTER — Ambulatory Visit: Payer: PRIVATE HEALTH INSURANCE | Attending: Orthopedic Surgery | Admitting: Physical Therapy

## 2016-05-04 ENCOUNTER — Ambulatory Visit: Payer: PRIVATE HEALTH INSURANCE | Admitting: Physical Therapy

## 2016-05-04 DIAGNOSIS — M545 Low back pain: Secondary | ICD-10-CM | POA: Diagnosis not present

## 2016-05-04 DIAGNOSIS — R252 Cramp and spasm: Secondary | ICD-10-CM | POA: Diagnosis present

## 2016-05-04 NOTE — Therapy (Signed)
[facility] [address] [place], [place], [address] Phone: [phone]   Fax:  [phone]  Physical Therapy Treatment  Patient Details  Name: [name] MRN: [id number] Date of Birth: [birth year] Referring Provider: Gean Birchwood , MD    Faith Rogue, MD  Encounter Date: 05/04/2016      PT End of Session - 05/04/16 1020    Visit Number 5   Number of Visits 12   Date for PT Re-Evaluation 05/28/16   Authorization Type workers compensation   PT Start Time 1015   PT Stop Time 1115   PT Time Calculation (min) 60 min      Past Medical History:  Diagnosis Date  . Asthma   . Sarcoidosis (HCC)   . Sarcoidosis (HCC)   . Sickle cell anemia (HCC)   . Sickle cell trait [facility])     Past Surgical History:  Procedure Laterality Date  . I&D EXTREMITY  01/19/2012   Procedure: IRRIGATION AND DEBRIDEMENT EXTREMITY;  Surgeon: Nadara Mustard, MD;  [facility];  Service: Orthopedics;  Laterality: Right;  . SHOULDER OPEN ROTATOR CUFF REPAIR  2007    There were no vitals filed for this visit.                       [facility] Adult PT Treatment/Exercise - 05/04/16 0001      Lumbar Exercises: Supine   Clam 10 reps   Clam Limitations bilateral   Bent Knee Raise 20 reps     Lumbar Exercises: Sidelying   Clam 10 reps  RT and LT    Hip Abduction 10 reps     Lumbar Exercises: Prone   Single Arm Raise --  h/o rt shoulder surgery    Straight Leg Raise 10 reps   Straight Leg Raises Limitations alternating with Tra contract   Other Prone Lumbar Exercises Prone scap squeeze x 10   Other Prone Lumbar Exercises Prone Tra contract requires max cues for correct technique , added h/s curls x 10 each     Moist Heat Therapy   Number Minutes Moist Heat 15 Minutes   Moist Heat Location Lumbar Spine     Electrical Stimulation   Electrical Stimulation Location low back   Electrical Stimulation Action IFC x 15 minutes   Electrical Stimulation Parameters L 12   Electrical Stimulation Goals Pain                  PT Short Term Goals - 04/22/16 1227      PT SHORT TERM GOAL #1   Title He will be independent with inital HEP   Status On-going     PT SHORT TERM GOAL #2   Title He wil report pain decreased 25% or more in leg   Status On-going     PT SHORT TERM GOAL #3   Title He will report improved tolerance for walking , standing and sitting with pain  decr 35% or more and increased to > 30 min for each   Status On-going     PT SHORT TERM GOAL #4   Title PAssive knee flexion prone full with min to no quad pain RT and LT   Status On-going     PT SHORT TERM GOAL #5   Title He will walk 500 feet or more without increased pain   Status On-going           PT Long Term Goals - 04/13/16 1412  PT LONG TERM GOAL #1   Title He will be independent with all HEP issued   Time 6   Period Weeks   Status New     PT LONG TERM GOAL #2   Title He will report pain in leg intermittant and decr 75% or more   Time 6   Period Weeks   Status New     PT LONG TERM GOAL #3   Title He will be able to touch his toes with min to no pain to allow for bending at work   Time 6   Period Weeks   Status New     PT LONG TERM GOAL #4   Title He will be able to stand at work for 60 min or more without increased pain   Time 6   Period Weeks   Status New     PT LONG TERM GOAL #5   Title He will be able to sit for 45-60 min without incr pain   Time 6   Period Weeks   Status New     Additional Long Term Goals   Additional Long Term Goals Yes     PT LONG TERM GOAL #6   Title He will be able to lift 30 pounds x10 floor to waist with 1-2/10 pain to begin to tolerate lifting at work   Time 6   Period Weeks   Status New               Plan - 05/04/16 1021    Clinical Impression Statement 10 minutes limitation for sitting, standing 30 minutes. He has been referred for injections. He is  concerned about his constant leg pain. Prone position centralizes leg pain. He c/ o "back locking up" twice duing treatment today which was releived by rest. No leg pain with sidelying exercises today.    PT Next Visit Plan Stabilization exercises , modalities for pain relief,  manual, check radicualr pain, check specific goals.       Patient will benefit from skilled therapeutic intervention in order to improve the following deficits and impairments:  Pain, Postural dysfunction, Increased muscle spasms, Decreased activity tolerance, Decreased range of motion  Visit Diagnosis: Bilateral low back pain, with sciatica presence unspecified  Cramp and spasm     Problem List Patient Active Problem List   Diagnosis Date Noted  . Cough 03/17/2016  . Numbness and tingling in right hand 02/23/2016  . Left lumbar radiculitis 02/04/2016  . Routine general medical examination at a health care facility 12/31/2015  . Degenerative disc disease, lumbar 12/31/2015  . Alcohol abuse 01/20/2009  . ALLERGIC RHINITIS 01/20/2009  . Severe persistent chronic asthma without complication 01/02/2009  . Sarcoidosis (HCC) 11/10/2007    Sherrie Mustache, PTA 05/04/2016, 12:25 PM  [facility] [address] [place], [place], [address] Phone: [phone]   Fax:  [phone]  Name: [name] MRN: [id number] Date of Birth: May 05, 1966

## 2016-05-06 ENCOUNTER — Ambulatory Visit: Payer: PRIVATE HEALTH INSURANCE | Admitting: Physical Therapy

## 2016-05-06 DIAGNOSIS — M545 Low back pain: Secondary | ICD-10-CM | POA: Diagnosis not present

## 2016-05-06 DIAGNOSIS — R252 Cramp and spasm: Secondary | ICD-10-CM

## 2016-05-06 MED FILL — HYDROCODON-APAP 5-325: 5-325 | 30 days supply | Qty: 60 | Fill #0

## 2016-05-06 NOTE — Therapy (Signed)
[place], [place], [address] Phone: [phone]   Fax:  [phone]  Physical Therapy Treatment  Patient Details  Name: [name] MRN: [id number] Date of Birth: Dec 26, 1965 Referring Provider: Frederik Pear , MD    Alger Simons, MD  Encounter Date: 05/06/2016      PT End of Session - 05/06/16 1503    Visit Number 6   Number of Visits 12   Date for PT Re-Evaluation 05/28/16   Authorization Type workers compensation   PT Start Time 0300   PT Stop Time 0350   PT Time Calculation (min) 50 min      Past Medical History:  Diagnosis Date  . Asthma   . Sarcoidosis ([place])   . Sarcoidosis ([place])   . Sickle cell anemia (HCC)   . Sickle cell trait [facility])     Past Surgical History:  Procedure Laterality Date  . I&D EXTREMITY  01/19/2012   Procedure: IRRIGATION AND DEBRIDEMENT EXTREMITY;  Surgeon: Newt Minion, MD;  Location: [place];  Service: Orthopedics;  Laterality: Right;  . SHOULDER OPEN ROTATOR CUFF REPAIR  2007    There were no vitals filed for this visit.      Subjective Assessment - 05/06/16 1502    Currently in Pain? Yes   Pain Score 8    Pain Location Back   Pain Orientation Left   Pain Descriptors / Indicators Aching   Pain Radiating Towards LT leg and thigh   Aggravating Factors  job duties, bending   Pain Relieving Factors meds, heat, e stim                         [facility] Adult PT Treatment/Exercise - 05/06/16 0001      Lumbar Exercises: Stretches   Prone on Elbows Stretch 60 seconds;2 reps   Press Ups 5 reps;10 seconds     Lumbar Exercises: Aerobic   Stationary Bike Nustep L5  5 min  UE and LE     Lumbar Exercises: Sidelying   Clam 20 reps  RT and LT    Hip Abduction --     Lumbar Exercises: Prone   Straight Leg Raise 10 reps   Straight Leg Raises Limitations alternating with Tra contract   Other Prone Lumbar Exercises Prone Tra contract requires max  cues for correct technique , added h/s curls x 10 each     Modalities   Modalities Traction     Traction   Type of Traction Lumbar   Min (lbs) 30   Max (lbs) 60   Hold Time 60   Rest Time 10   Time 15     Manual Therapy   Manual Therapy Manual Traction   Manual Traction Both legs in supine 30 sec x 5                   PT Short Term Goals - 05/06/16 1548      PT SHORT TERM GOAL #1   Title He will be independent with inital HEP   Time 2   Period Weeks   Status Achieved     PT SHORT TERM GOAL #2   Title He wil report pain decreased 25% or more in leg   Baseline no change 05/06/2016   Time 3   Period Weeks   Status On-going     PT SHORT TERM GOAL #3   Title He will report improved tolerance  for walking , standing and sitting with pain  decr 35% or more and increased to > 30 min for each   Baseline can stand now for 30 minutes however still leg pain   Time 3   Period Weeks   Status Partially Met     PT SHORT TERM GOAL #4   Title PAssive knee flexion prone full with min to no quad pain RT and LT   Time 3   Period Weeks   Status Unable to assess     PT SHORT TERM GOAL #5   Title He will walk 500 feet or more without increased pain   Baseline unable   Time 3   Period Weeks   Status On-going           PT Long Term Goals - 04/13/16 1412      PT LONG TERM GOAL #1   Title He will be independent with all HEP issued   Time 6   Period Weeks   Status New     PT LONG TERM GOAL #2   Title He will report pain in leg intermittant and decr 75% or more   Time 6   Period Weeks   Status New     PT LONG TERM GOAL #3   Title He will be able to touch his toes with min to no pain to allow for bending at work   Time 6   Period Weeks   Status New     PT LONG TERM GOAL #4   Title He will be able to stand at work for 60 min or more without increased pain   Time 6   Period Weeks   Status New     PT LONG TERM GOAL #5   Title He will be able to sit for 45-60  min without incr pain   Time 6   Period Weeks   Status New     Additional Long Term Goals   Additional Long Term Goals Yes     PT LONG TERM GOAL #6   Title He will be able to lift 30 pounds x10 floor to waist with 1-2/10 pain to begin to tolerate lifting at work   Time 6   Period Weeks   Status New               Plan - 05/06/16 1543    Clinical Impression Statement Pt reports radicular sx reduced during manual traction and prone exercises while in PT.  He also feels IFC and HMP are effective however his pain returns in his left leg immediately after PT visits. He continues to work and reports he is still performing most job activites and does not take breaks as he should. He plans to talk to his supervisor tomorrow to discuss possible transfer to  light duty position as prescribed by his MD. He is independnent with HEP. STG#1 Met. He can stand for 30 minutes. Wallking and sitting limited to 10 minutes. STG# 3 partially met. he reports no change in overall leg pain. Trail of Mechanical Traction today per verbal discussion with primjary PT. Pt tolerated well. Will assess benefit next visit.    PT Next Visit Plan assess benefit of mechanical traction. Stabilization exercises , modalities for pain relief,  manual, check radicualr pain,       Patient will benefit from skilled therapeutic intervention in order to improve the following deficits and impairments:  Pain, Postural dysfunction, Increased muscle spasms, Decreased activity  tolerance, Decreased range of motion  Visit Diagnosis: Bilateral low back pain, with sciatica presence unspecified  Cramp and spasm     Problem List Patient Active Problem List   Diagnosis Date Noted  . Cough 03/17/2016  . Numbness and tingling in right hand 02/23/2016  . Left lumbar radiculitis 02/04/2016  . Routine general medical examination at a health care facility 12/31/2015  . Degenerative disc disease, lumbar 12/31/2015  . Alcohol abuse  01/20/2009  . ALLERGIC RHINITIS 01/20/2009  . Severe persistent chronic asthma without complication 95/06/3266  . Sarcoidosis ([place]) 11/10/2007    [name], PTA 05/06/2016, 4:10 PM  [place], [place], [address] Phone: [phone]   Fax:  [phone]  Name: [name] MRN: [id number] Date of Birth: 08-18-1966

## 2016-05-07 MED FILL — NAPROXEN 500 MG TABLET: 500 | 30 days supply | Qty: 60 | Fill #0

## 2016-05-11 ENCOUNTER — Ambulatory Visit: Payer: PRIVATE HEALTH INSURANCE | Attending: Orthopedic Surgery | Admitting: Physical Therapy

## 2016-05-11 DIAGNOSIS — M545 Low back pain: Secondary | ICD-10-CM | POA: Diagnosis not present

## 2016-05-11 DIAGNOSIS — R252 Cramp and spasm: Secondary | ICD-10-CM | POA: Diagnosis present

## 2016-05-11 NOTE — Therapy (Signed)
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 Asthma   . Sarcoidosis ([place])   . Sarcoidosis ([place])   . Sickle cell anemia (HCC)   . Sickle cell trait [facility])     Past Surgical History:  Procedure Laterality Date  . I&D EXTREMITY  01/19/2012   Procedure: IRRIGATION AND DEBRIDEMENT EXTREMITY;  Surgeon: Newt Minion, MD;  Location: [place];  Service: Orthopedics;  Laterality: Right;  . SHOULDER OPEN ROTATOR CUFF REPAIR  2007    There were no vitals filed for this visit.      Subjective Assessment - 05/11/16 1506    Subjective I am having a lumbar injection on Monday. The leg pain was better after traction. Came back the next morning. Back more painful today.    Currently in Pain? Yes   Pain Score 7    Pain Location Back   Pain Orientation Left   Pain Type Acute pain                         [facility] Adult PT Treatment/Exercise - 05/11/16 0001      Lumbar Exercises: Stretches   Standing Extension 3 reps;10 seconds   Standing Extension Limitations and right lateral hip press with left side bend 10sec  x 3      Lumbar Exercises: Aerobic   Stationary Bike Nustep L5  10 min  UE and LE     Modalities   Modalities Ultrasound     Ultrasound   Ultrasound Location L PSIS   Ultrasound Parameters 1 mhz, 1.3 w/cm2, 100% x 8  min    Ultrasound Goals Pain     Manual Therapy   Soft tissue mobilization Right lower lumbar paraspinals and around L PSIS, proximal glutes                  PT Short Term Goals - 05/06/16 1548      PT SHORT TERM GOAL #1   Title He will be independent with inital HEP   Time 2   Period Weeks   Status Achieved     PT SHORT TERM GOAL #2   Title He wil report pain decreased 25% or more in leg   Baseline no change 05/06/2016   Time 3   Period Weeks   Status On-going     PT SHORT TERM GOAL #3   Title He will report improved tolerance for walking , standing and sitting with pain  decr 35% or more and increased to > 30 min for each   Baseline can stand now for 30 minutes however still leg pain   Time 3   Period Weeks   Status Partially Met     PT  SHORT TERM GOAL #4   Title PAssive knee flexion prone full with min to no quad pain RT and LT   Time 3   Period Weeks   Status Unable to assess     PT SHORT TERM GOAL #5   Title He will walk 500 feet or more without increased pain   Baseline unable   Time 3   Period Weeks   Status On-going           PT Long Term Goals - 04/13/16 1412      PT LONG TERM GOAL #1   Title He will be independent with all HEP issued   Time 6   Period Weeks   Status New     PT LONG TERM GOAL #2   Title He will report pain in leg intermittant and decr 75% or more   Time 6   Period Weeks   Status New     PT LONG TERM GOAL #3   Title He will be able to touch his toes with min to no pain to allow for bending at work   Time 6   Period Weeks   Status New     PT LONG TERM GOAL #4   Title He will be able to stand at work for 60 min or more without increased pain   Time 6   Period Weeks   Status New     PT LONG TERM GOAL #5   Title He will be able to sit for 45-60 min without incr pain   Time 6   Period Weeks   Status New     Additional Long Term Goals   Additional Long Term Goals Yes     PT LONG TERM GOAL #6   Title He will  be able to lift 30 pounds x10 floor to waist with 1-2/10 pain to begin to tolerate lifting at work   Time 6   Period Weeks   Status New               Plan - 05/11/16 1509    Clinical Impression Statement Pt reports decreased leg pain after traction that returned the next day as well as increased left side lumbar soreness and pain. He reports lumbar pain is worse than back pain today. Focused soft tissue work to left lower paraspinals and around PSIS with pt reporting abolishment of leg and lumbar pain. Trial of ultrasound at L PSIS. Upon standing, pt reports no lumbar pain however return of leg pain. After standing lumbar extensions and left side bends, left leg pain abolished. Continued to encourage pt to perform lumbar extensions and left side bends throughout the day to decrease radicular symptoms.    PT Next Visit Plan Stabilization exercises , modalities for pain relief,  manual, check radicualr pain, assess benefit of manual/ [place]   PT Home Exercise Plan CHECK LTGs ; mutifidus contraction, prone lye, press ups, standing lumbar extension, standing right lateral hip press/left side bend   Consulted and Agree with Plan of Care Patient      Patient will benefit from skilled therapeutic intervention in order to improve the following deficits and impairments:  Pain, Postural dysfunction, Increased muscle spasms, Decreased activity tolerance, Decreased range of motion  Visit Diagnosis: Bilateral low back pain, with sciatica presence unspecified  Cramp and spasm     Problem List Patient Active Problem List   Diagnosis Date Noted  . Cough 03/17/2016  . Numbness and tingling in right  hand 02/23/2016  . Left lumbar radiculitis 02/04/2016  . Routine general medical examination at a health care facility 12/31/2015  . Degenerative disc disease, lumbar 12/31/2015  . Alcohol abuse 01/20/2009  . ALLERGIC RHINITIS 01/20/2009  . Severe persistent chronic asthma without complication  41/14/6431  . Sarcoidosis ([place]) 11/10/2007    Dorene Ar, PTA 05/11/2016, 3:21 PM  [facility] [address] [place], [place], [address] Phone: [phone]   Fax:  [phone]  Name: [name] MRN: [id number] Date of Birth: [birth year]

## 2016-05-13 ENCOUNTER — Ambulatory Visit: Payer: 59 | Attending: Orthopedic Surgery | Admitting: Physical Therapy

## 2016-05-13 DIAGNOSIS — R252 Cramp and spasm: Secondary | ICD-10-CM | POA: Insufficient documentation

## 2016-05-13 DIAGNOSIS — M545 Low back pain: Secondary | ICD-10-CM | POA: Insufficient documentation

## 2016-05-13 NOTE — Therapy (Signed)
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 Asthma   . Sarcoidosis ([place])   . Sarcoidosis ([place])   . Sickle cell anemia (H[facility])   . Sickle cell trait [facility])     Past Surgical History:  Procedure Laterality Date  . I&D EXTREMITY  01/19/2012   Procedure: IRRIGATION AND DEBRIDEMENT EXTREMITY;  Surgeon: Newt Minion, MD;  Location: [facility];  Service: Orthopedics;  Laterality: Right;  . SHOULDER OPEN ROTATOR CUFF REPAIR  2007    There were no vitals filed for this visit.      Subjective Assessment - 05/13/16 1339    Subjective My job agreed that I can sit down 50% of the time but they still call me for things to do all the time. My pain was 10/10 last night and this morning. I took Naproxen this morning it it finally kicked in this afternoon.    Currently in Pain? Yes   Pain Score 7    Pain Location Back   Pain Orientation Left   Pain Descriptors / Indicators Sharp;Constant   Pain Radiating Towards Lt leg and thigh   Aggravating Factors  twisting, bending   Pain Relieving Factors meds, heat, e-stim, leaning back            [facility] PT Assessment - 05/13/16 0001      AROM   Lumbar Flexion he can reach distal  tibia   Lumbar Extension 20   Lumbar - Right Side Bend 25   Lumbar - Left Side Bend 26     Flexibility   Hamstrings Rt  68 left 62  passive SLR                     [facility] Adult PT Treatment/Exercise - 05/13/16 0001      Lumbar Exercises: Stretches   Standing Extension 5 reps;10 seconds   Standing Extension Limitations and right lateral hip press with left side bend 10sec  x 3    Prone on Elbows Stretch 60 seconds;2 reps   Press Ups 5 reps;10 seconds     Lumbar Exercises: Aerobic   Stationary Bike Nustep L5  10 min  UE and LE     Lumbar Exercises: Standing   Other Standing Lumbar Exercises Right hip press into wall 5 sec x 5     Lumbar Exercises: Prone   Straight Leg Raise 10 reps   Straight Leg Raises Limitations alternating with Tra contract   Other Prone Lumbar Exercises Prone Tra contract requires max cues for correct technique , added h/s curls x 10 each, then bilateral x 10      Electrical Stimulation   Electrical Stimulation Location low back   Electrical Stimulation Action FC x 15 minutes  Electrical Stimulation Parameters L 10   Electrical Stimulation Goals Pain                  PT Short Term Goals - 05/13/16 1400      PT SHORT TERM GOAL #1   Title He will be independent with inital HEP   Time 2   Period Weeks   Status Achieved     PT SHORT TERM GOAL #2   Title He wil report pain decreased 25% or more in leg   Baseline no change 05/13/2016   Time 3   Period Weeks   Status On-going     PT SHORT TERM GOAL #3   Title He will report improved tolerance for walking , standing and sitting with pain  decr 35% or more and increased to > 30 min for each   Baseline can stand now for 30 minutes however still leg pain   Time 3   Period Weeks   Status Partially Met     PT SHORT TERM GOAL #4   Title PAssive knee flexion prone full with min to no quad pain RT and LT   Baseline quad stretch felt right, Low back pain felt left. Left ROM decreased compared to right   Time 3   Period Weeks   Status On-going     PT SHORT TERM GOAL #5   Title He will walk 500 feet  or more without increased pain   Baseline 500 ft with pain 7/10, did not go up   Time 3   Period Weeks   Status Partially Met           PT Long Term Goals - 05/13/16 1403      PT LONG TERM GOAL #1   Title He will be independent with all HEP issued   Time 6   Period Weeks   Status On-going     PT LONG TERM GOAL #2   Title He will report pain in leg intermittant and decr 75% or more   Time 6   Period Weeks   Status On-going     PT LONG TERM GOAL #3   Title He will be able to touch his toes with min to no pain to allow for bending at work   Baseline distal tibia 05/13/16 improved from mids tibia   Time 6   Period Weeks   Status Unable to assess     PT LONG TERM GOAL #4   Title He will be able to stand at work for 60 min or more without increased pain   Baseline 10 minutes max   Time 6   Period Weeks     PT LONG TERM GOAL #5   Title He will be able to sit for 45-60 min without incr pain   Baseline 20 minutes max   Time 6   Period Weeks     PT LONG TERM GOAL #6   Title He will be able to lift 30 pounds x10 floor to waist with 1-2/10 pain to begin to tolerate lifting at work   Time 6   Period Weeks   Status Unable to assess               Plan - 05/13/16 1351    Clinical Impression Statement Pt reports his leg and bak pain returned immediatley after last treatment. He was in alot of pain last night and this morning however did not try his exercises. Reminded him of  standing back extensions and right hip presses that decrease his leg pain. Ambulated in clinic 558f with back and LT leg pain remaining at 7/10, no increase. STG# 5 partially Met. Performed back extensions and right hip press into wall. Pt reports a decrease in intensity of leg pain. In prone, performed 5 prone press ups and pt reports leg pain abolished. Continued prone core and hip strength with good tolerance. Pt scheduled for lumbar injections Monday. He may cancel his next appointment per MD  instructions.    PT Next Visit Plan see how injections went? continue extension biased treatment. possible discharge soon if not better.    PT Home Exercise Plan mutifidus contraction, prone lye, press ups, standing lumbar extension, standing right lateral hip press/left side bend      Patient will benefit from skilled therapeutic intervention in order to improve the following deficits and impairments:  Pain, Postural dysfunction, Increased muscle spasms, Decreased activity tolerance, Decreased range of motion  Visit Diagnosis: Bilateral low back pain, with sciatica presence unspecified  Cramp and spasm     Problem List Patient Active Problem List   Diagnosis Date Noted  . Cough 03/17/2016  . Numbness and tingling in right hand 02/23/2016  . Left lumbar radiculitis 02/04/2016  . Routine general medical examination at a health care facility 12/31/2015  . Degenerative disc disease, lumbar 12/31/2015  . Alcohol abuse 01/20/2009  . ALLERGIC RHINITIS 01/20/2009  . Severe persistent chronic asthma without complication 018/86/7737 . [place]oidosis [place] 11/10/2007    DDorene Ar PTA 05/13/2016, 3:33 PM  [facility] [place] [address]Phone: [phone]  Fax:  [phone] Name: Ryan BonaMRN: [id number] of Birth: [birth year]

## 2016-05-17 ENCOUNTER — Telehealth: Payer: Self-pay | Admitting: Internal Medicine

## 2016-05-17 NOTE — Telephone Encounter (Signed)
Called spoke with pt. He is scheduled to come in and see MW tomorrow at 11:30 for acute visit. Nothing further needed

## 2016-05-18 ENCOUNTER — Ambulatory Visit: Payer: 59

## 2016-05-18 ENCOUNTER — Encounter: Payer: Self-pay | Admitting: Internal Medicine

## 2016-05-18 ENCOUNTER — Ambulatory Visit (INDEPENDENT_AMBULATORY_CARE_PROVIDER_SITE_OTHER): Payer: 59 | Admitting: Internal Medicine

## 2016-05-18 VITALS — BP 134/78 | HR 104 | Ht 69.5 in | Wt 145.6 lb

## 2016-05-18 DIAGNOSIS — J4550 Severe persistent asthma, uncomplicated: Secondary | ICD-10-CM

## 2016-05-18 DIAGNOSIS — D869 Sarcoidosis, unspecified: Secondary | ICD-10-CM | POA: Diagnosis not present

## 2016-05-18 MED ORDER — PREDNISONE 10 MG PO TABS: ORAL_TABLET | ORAL | 0 refills | Status: DC

## 2016-05-18 MED FILL — predniSONE 10 MG TABS: 10 | 6 days supply | Qty: 14 | Fill #0

## 2016-05-18 NOTE — Patient Instructions (Addendum)
Prednisone 10 mg take  4 each am x 2 days,   2 each am x 2 days,  1 each am x 2 days and stop   Plan A = Automatic =  dulera 200 Take 2 puffs first thing in am and then another 2 puffs about 12 hours later.  Take it when you do your dental care  Work on inhaler technique:  relax and gently blow all the way out then take a nice smooth deep breath back in, triggering the inhaler at same time you start breathing in.  Hold for up to 5 seconds if you can. Blow out thru nose. Rinse and gargle with water when done      Plan B = Backup Only use your albuterol as a rescue medication to be used if you can't catch your breath by resting or doing a relaxed purse lip breathing pattern.  - The less you use it, the better it will work when you need it. - Ok to use the inhaler up to 2 puffs  every 4 hours if you must but call for appointment if use goes up over your usual need - Don't leave home without it !!  (think of it like the spare tire for your car)   If your knee continues to bother you, you may need a second opinion    Be sure to keep appt for pfts

## 2016-05-18 NOTE — Progress Notes (Addendum)
Subjective:    Patient ID: [name], male    DOB: 06/07/1966     MRN: [id number]    Brief patient profile:  50 yobm former smoker quit around 1995,  dx with sarcoid 05/2004  by tbbx on prednisone off and on since then with inconsistent adherence documented.   December 13, 2008--Last seen 3/09- did not return for follow up. Since last viist. Tapered off prednisone, he has taken on/off x one year. Would take when he was dyspneic, wheezing/ and cough. Over last 2 weeks increased cough, tightness, dyspnea, and wheezing. Almost out of prednisone. --CXR hyperinflation of the lungs with enlarged hilar tissue  Minimally increased.  ACE at 57. Predisone restarted at 20mg  once daily.  December 18, 2008-- Returns compalining that breathing no better. still has increased SOB since last OV, wheezing, prod cough with white sputum - worse since last OV. Pt misunderstood instructions, only on prednisone 10mg  once daily. Rec 20 mg per day   January 02, 2009 still on 20 mg per day and finally turning the corner with less sob and cough. rec Prednisone 10 mg 2 each am until 100% better, then 1 daily x 2weeks then one half daily until seen   March 04, 2010 SOB/Wheezing/.. cc working with chemicals and SOB has been worsened....patient states he just found out he has been exposed to asbestos for last 5 yrs.   rec prednisone to 20 mg per day x 2 weeks, minimally better.   rec add dulera 200, taper pred to 5 mg if tolerates, f/u 4 weeks > lost insurance, did not return   05/28/2011 f/u ov/[name] cc much worse since ran out of prednisone x 2 weeks ago had taperd down to 10 mg per day. Thinks dulera helped while on it but also ran out of it over a year ago and had just been relying on the prednisone.  Main problems are minimally prod cough and sob with minimal adls and noct wheeze with R > L ant chest tightness. Called in prednisone, could not afford to fill rx. No nausea, ocular or articular co's or rash. rec Dulera 200 Take 2  puffs first thing in am and then another 2 puffs about 12 hours later.  Work on inhaler technique: .    Prednisone tablets 10mg   2 daily until 100% then 1 daily x 1 week then 1/2 daily x 1 week then try stopping (only going to work if stay on dulera 200).  07/23/2011 f/u ov/[name] cc 100% while on prednisone and dulera 200 but not consistent and worse p finished prednisone x  Days before ov / has also run out of albuterol.  No purulent  sputum, ocular or articular symptoms rec Remember the dulera Take 2 puffs first thing in am and then another 2 puffs about 12 hours later.  Only use your xopenex sample as a rescue medication    10/01/2011 ov/[name] off prednisone since Oct 2012 again ran out of both inhlaers before ov> worse sob since, minimal cough.   Main cc is indolent onset x sev weeks  of  worsening R shoulder pain radiating down to his forearm assoc with tingling and numbness in all 5 fingers. No neck pain, no fever. Has f/u with his othopedic doctor already scheduled.  Denies trauma. rec Remember the dulera Take 2 puffs first thing in am and then another 2 puffs about 12 hours later.  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 No purulent sputum, ocular or articular symptoms or rash. rec dulera 200 Take 2 puffs first thing in am and then another 2 puffs about 12 hours later.  office visit in 6 weeks, call sooner if needed - bring all inhalers with you   04/07/2012 f/u ov/[name] still on dulera 200 bid with second dose 8 pm and wakes up 2-4 am take 1 xopenex and goes back. Unable to cut grass 7/4 due to sob. No purulent sputum or sinus/ hb symptoms. rec Prednisone 10 mg take  4 each am x  2 days,   2 each am x 2 days,  1 each am x 2 days and stop Please schedule a follow up office visit in 4 weeks, sooner if needed with all inhalers in hand and don't fill any more prescriptions in meantime  05/08/2012 f/u ov/[name] cc missed appt  05/19/2012 f/u ov/[name] cc missed appt  02/09/2013 f/u ov/[name]  Chief Complaint  Patient presents with  . Follow-up    Pt c/o increased SOB, wheezing, and chest tightness x 3-4 wks. Using rescue inhaler several times per day with little relief. Waking up at least 4 times per night with wheezing and SOB.   worse x one month using rescue once every other day at baseline But states even then uses extra dulera daytime and dulera counts / refills don't add up to adherence, much worse when runs out of dulera. rec Prednisone 10 mg take  4 each am x 2 days,   2 each am x 2 days,  1 each am x2days and stop  Plan A = Automatically Dulera 200 Take 2 puffs first thing in am and then another 2 puffs about 12 hours later.  Only use your albuterol (Plan B ventolin or proaire) as a rescue medication   03/09/2013 f/u ov/[name] asthma on dulera 200 2bid and xopenex maybe 2x week, counts add up Chief Complaint  Patient presents with  . Follow-up    Pt states breathing is much improved since last visit. No new co's today.   rec Continue dulera 200 Take 2 puffs first thing in am and then another 2 puffs about 12 hours later.  Only use your albuterol(xopenex) prn  06/28/2013 f/u ov/[name] re:  Chief Complaint  Patient presents with  . Follow-up    Pt states breathing some since last visit. Wakes up occ feeling SOB and has to use ventolin.  He is using ventolin as least 3 x per wk.  rec Dulera 200 Take 2 puffs first thing in am and then another 2 puffs about 12 hours later.  Only use your albutero (proair) as a rescue medication Prednisone 10 mg take  4 each am x 2 days,   2 each am x 2 days,  1 each am x 2 days and stop  Please remember to go to the  x-ray department  downstairs for your tests - we will call you with the results when they are available.   Please schedule a follow up office visit in 4 weeks, sooner if needed      12/16/2015  f/u ov/[name] re: chronic asthma/ sarcoid  Chief Complaint  Patient presents with  . Acute Visit    Pt c/o wheezing and occ SOB x 2 months.   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  Only use your albuterol as a rescue medication and call if need goes up     05/18/2016  f/u ov/[name] re: dtca asthma/ documented non-adherence  Chief Complaint  Patient presents with  . Acute Visit    Pt c/o increased SOB, back pain, PND, losing voice X1 month.    admits not using dulera regularly, has "new rx" in hand = sample size only Onset of worse sob / chest tightness x one month indolent/ progressive with wheezing some better with saba which overuses at baseline and did not call as instructed    No obvious daytime variabilty or assoc excess/ purulent sputum or mucus plugs  or cp or  overt sinus or hb symptoms. No unusual exp hx or h/o childhood pna/ asthma or premature birth to his knowledge.   Sleeping ok without nocturnal  or early am exacerbation  of respiratory  c/o's or need for noct saba. Also denies any obvious fluctuation of symptoms with weather or environmental changes or other aggravating or alleviating factors except as outlined above  Current Medications, Allergies, Past Medical History, Past Surgical History, Family History, and Social History were reviewed in [facility] electronic medical record.  ROS  The following are not active complaints unless bolded sore throat, dysphagia, dental problems, itching, sneezing,  nasal congestion or excess/ purulent secretions, ear ache,   fever, chills, sweats, unintended wt loss, pleuritic or exertional cp, hemoptysis,   orthopnea pnd or leg swelling, presyncope, palpitations, heartburn, abdominal pain, anorexia, nausea, vomiting, diarrhea  or change in bowel or urinary habits, change in stools or urine, dysuria,hematuria,  rash, arthralgias, visual complaints, headache, numbness weakness or ataxia or problems with walking or coordination,  change in mood/affect or memory.         Past Medical History:  SARCOIDOSIS (ICD-135).........................................................Marland Kitchen.Brooke Steinhilber  - FOB with lung biopsy 2005 positive NCG   Sickle Cell Trait  Asthma      Family History:  Pos asthma, mother  Mother - Sickle Cell Disease, Asthma, CAD s/p MI (prior to 48 yo), s/p CVA (frequent hospitalizations)  Father - passed in 1012005 79(62 yo) from Prostate Cancer  4 siblings  Sister - multiple medical problems - details unk  Brother - ? Cancer  Brother - died 2/2 drug abuse     Social History:  Married. Lives with wife [name] and kids. 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 Quit smoking 1995. Drinks 1 bottle Brandy every other day. No drugs or hx of IVDU. Does lots of walking with job, but no regular exercise.           Objective:   Physical Exam  Ambulatory healthy bm  / vital signs reviewed    wt   143 Feb 08, 2009 > 141 March 04, 2010  >   01/26/2012  158 > 04/07/2012  151>  158 02/09/2013 > 03/10/13 154 > 06/28/2013  153 > 12/16/2015  147 > 05/19/2016 145  Vital signs reviewed  HEENT: nl dentition, turbinates, and orophanx. Nl external ear canals without cough reflex  Neck without JVD/Nodes/TM  Lungs late exp  wheeze bilaterally  RRR no s3 or murmur or increase in P2  Abd soft and benign with nl excursion in the supine position. No bruits or organomegaly  Ext warm without calf tenderness, cyanosis clubbing or edema  Skin warm and dry without lesions      I personally reviewed images and agree with radiology impression as follows:  CXR:   03/17/16 No acute  abnormality noted. Stable hilar adenopathy.      Assessment & Plan:

## 2016-05-19 ENCOUNTER — Encounter: Payer: Self-pay | Admitting: Internal Medicine

## 2016-05-19 NOTE — Assessment & Plan Note (Signed)
- -   FOB with lung biopsy 2005 positive NCG - on and off chronic prednisone until 09/2011 tapered off  No evidence of radiographic worsening but very well could have airway involvement at this point.  The goal with a chronic steroid dependent illness is always arriving at the lowest effective dose that controls the disease/symptoms and not accepting a set "formula" which is based on statistics or guidelines that don't always take into account patient  variability or the natural hx of the dz in every individual patient, which may well vary over time.  For now therefore I recommend only 6 days prednisone then regroup

## 2016-05-19 NOTE — Assessment & Plan Note (Signed)
-   PFT's 01/26/2012 FEV1  1.46 (39%) ratio 49 with 31% p B2, DLCO100% - PFT's 06/28/2013 > did not return as rec  - 05/18/2016  extensive coaching HFA effectiveness =    90%   DDX of  difficult airways management almost all start with A and  include Adherence, Ace Inhibitors, Acid Reflux, Active Sinus Disease, Alpha 1 Antitripsin deficiency, Anxiety masquerading as Airways dz,  ABPA,  Allergy(esp in young), Aspiration (esp in elderly), Adverse effects of meds,  Active smokers, A bunch of PE's (a small clot burden can't cause this syndrome unless there is already severe underlying pulm or vascular dz with poor reserve) plus two Bs  = Bronchiectasis and Beta blocker use..and one C= CHF  In this case Adherence is the biggest issue and starts with  inability to use HFA effectively and also  understand that SABA treats the symptoms but doesn't get to the underlying problem (inflammation).  I used  the analogy of putting steroid cream on a rash to help explain the meaning of topical therapy and the need to get the drug to the target tissue.    ? Allergy component > max dose of dulera/ pred x 6 days only then regroup here   I had an extended discussion with the patient reviewing all relevant studies completed to date and  lasting 15 to 20 minutes of a 25 minute visit    Each maintenance medication was reviewed in detail including most importantly the difference between maintenance and prns and under what circumstances the prns are to be triggered using an action plan format that is not reflected in the computer generated alphabetically organized AVS.    Please see instructions for details which were reviewed in writing and the patient given a copy highlighting the part that I personally wrote and discussed at today's ov.

## 2016-05-20 ENCOUNTER — Ambulatory Visit: Payer: Self-pay | Admitting: Internal Medicine

## 2016-05-20 ENCOUNTER — Ambulatory Visit: Payer: 59 | Admitting: Physical Therapy

## 2016-05-24 ENCOUNTER — Encounter: Payer: Self-pay | Admitting: Internal Medicine

## 2016-05-24 ENCOUNTER — Ambulatory Visit (INDEPENDENT_AMBULATORY_CARE_PROVIDER_SITE_OTHER)
Admission: RE | Admit: 2016-05-24 | Discharge: 2016-05-24 | Disposition: A | Discharge status: Home / Self Care | Payer: 59 | Source: Ambulatory Visit | Attending: Internal Medicine | Admitting: Internal Medicine

## 2016-05-24 ENCOUNTER — Telehealth: Payer: Self-pay

## 2016-05-24 ENCOUNTER — Ambulatory Visit (INDEPENDENT_AMBULATORY_CARE_PROVIDER_SITE_OTHER): Payer: 59 | Admitting: Internal Medicine

## 2016-05-24 VITALS — BP 148/98 | HR 85 | Temp 98.2°F | Resp 16 | Ht 69.5 in | Wt 145.4 lb

## 2016-05-24 DIAGNOSIS — M5136 Other intervertebral disc degeneration, lumbar region: Secondary | ICD-10-CM | POA: Diagnosis not present

## 2016-05-24 DIAGNOSIS — M179 Osteoarthritis of knee, unspecified: Secondary | ICD-10-CM | POA: Diagnosis not present

## 2016-05-24 DIAGNOSIS — I10 Essential (primary) hypertension: Secondary | ICD-10-CM | POA: Diagnosis not present

## 2016-05-24 DIAGNOSIS — M25562 Pain in left knee: Secondary | ICD-10-CM | POA: Diagnosis not present

## 2016-05-24 DIAGNOSIS — Z23 Encounter for immunization: Secondary | ICD-10-CM

## 2016-05-24 DIAGNOSIS — M5416 Radiculopathy, lumbar region: Secondary | ICD-10-CM | POA: Diagnosis not present

## 2016-05-24 DIAGNOSIS — M25569 Pain in unspecified knee: Secondary | ICD-10-CM | POA: Insufficient documentation

## 2016-05-24 MED ORDER — HYDROCODONE-ACETAMINOPHEN 5-325 MG PO TABS: 1.0000 | ORAL_TABLET | Freq: Four times a day (QID) | ORAL | 0 refills | Status: DC | PRN

## 2016-05-24 NOTE — Telephone Encounter (Signed)
OK with me.

## 2016-05-24 NOTE — Telephone Encounter (Signed)
Patient is requesting a note stating that he can not do physical therapy this week.   I can write the note if you agree:   [address] [address]

## 2016-05-24 NOTE — Progress Notes (Signed)
Subjective:  Patient ID: [name], male    DOB: 09/28/1966  Age: 50 y.o. MRN: [id number]  CC: Knee Pain; Hypertension; and Back Pain   HPI [name] presents for concerns about his right knee for 3 weeks. He denies any recent trauma or injury but says that 3 weeks ago his right knee was painful and swollen. He has rested it and applied ice and said the swelling has resolved but now he has pain. His wife has also noticed that there is a scab in front of his right knee but he doesn't know how he acquired that. He is currently undergoing treatment for low back pain and is scheduled to have an epidural injection in his back in the next few weeks. He requests a refill on Norco for pain relief.  Outpatient Medications Prior to Visit  Medication Sig Dispense Refill  . albuterol (PROAIR HFA) 108 (90 Base) MCG/ACT inhaler Inhale 2 puffs into the lungs every 4 (four) hours as needed for wheezing. 1 Inhaler 1  . naproxen (NAPROSYN) 500 MG tablet Take 1 tablet (500 mg total) by mouth 2 (two) times daily with a meal. 60 tablet 3  . cyclobenzaprine (FLEXERIL) 5 MG tablet Take 1 tablet (5 mg total) by mouth 3 (three) times daily as needed for muscle spasms. 90 tablet 2  . HYDROcodone-acetaminophen (NORCO/VICODIN) 5-325 MG tablet Take 1 tablet by mouth every 6 (six) hours as needed for moderate pain. 75 tablet 0  . predniSONE (DELTASONE) 10 MG tablet Take  4 each am x 2 days,   2 each am x 2 days,  1 each am x 2 days and stop 14 tablet 0  . mometasone-formoterol (DULERA) 200-5 MCG/ACT AERO Inhale 2 puffs into the lungs 2 (two) times daily. 1 Inhaler 11   No facility-administered medications prior to visit.     ROS Review of Systems  Constitutional: Negative.  Negative for activity change, chills and fatigue.  HENT: Negative.   Eyes: Negative.  Negative for visual disturbance.  Respiratory: Negative for cough, choking, chest tightness, shortness of breath, wheezing and stridor.     Cardiovascular: Negative for chest pain, palpitations and leg swelling.  Gastrointestinal: Negative.  Negative for abdominal pain, constipation, diarrhea, nausea and vomiting.  Endocrine: Negative.   Genitourinary: Negative.   Musculoskeletal: Positive for arthralgias and back pain. Negative for joint swelling, myalgias and neck pain.  Skin: Positive for wound. Negative for color change.  Allergic/Immunologic: Negative.   Neurological: Negative.  Negative for dizziness, weakness and headaches.  Hematological: Negative.  Negative for adenopathy. Does not bruise/bleed easily.  Psychiatric/Behavioral: Negative.     Objective:  BP (!) 148/98 (BP Location: Left Arm, Patient Position: Sitting, Cuff Size: Normal)   Pulse 85   Temp 98.2 F (36.8 C) (Oral)   Resp 16   Ht 5' 9.5" (1.765 m)   Wt 145 lb 7 oz (66 kg)   SpO2 97%   BMI 21.17 kg/m   BP Readings from Last 3 Encounters:  05/24/16 (!) 148/98  05/18/16 134/78  03/17/16 122/86    Wt Readings from Last 3 Encounters:  05/24/16 145 lb 7 oz (66 kg)  05/18/16 145 lb 9.6 oz (66 kg)  03/17/16 146 lb (66.2 kg)    Physical Exam  Constitutional: No distress.  HENT:  Mouth/Throat: Oropharynx is clear and moist. No oropharyngeal exudate.  Eyes: Conjunctivae are normal. Right eye exhibits no discharge. Left eye exhibits no discharge. No scleral icterus.  Neck: Normal range of  motion. Neck supple. No JVD present. No tracheal deviation present. No thyromegaly present.  Cardiovascular: Normal rate, regular rhythm, normal heart sounds and intact distal pulses.  Exam reveals no gallop and no friction rub.   No murmur heard. Pulmonary/Chest: Effort normal and breath sounds normal. No stridor. No respiratory distress. He has no wheezes. He has no rales.  Abdominal: Soft. Bowel sounds are normal. He exhibits no distension and no mass. There is no tenderness. There is no rebound and no guarding.  Musculoskeletal: Normal range of motion. He  exhibits no edema or tenderness.       Right knee: He exhibits deformity. He exhibits normal range of motion, no swelling, no effusion, no ecchymosis, no erythema, no LCL laxity and no bony tenderness. No tenderness found.       Legs: Lymphadenopathy:    He has no cervical adenopathy.  Skin: Skin is warm and dry. No rash noted. He is not diaphoretic. No erythema. No pallor.  Vitals reviewed.   Lab Results  Component Value Date   WBC 4.6 12/31/2015   HGB 15.3 12/31/2015   HCT 44.3 12/31/2015   PLT 231.0 12/31/2015   GLUCOSE 90 12/31/2015   CHOL 188 12/31/2015   TRIG 158.0 (H) 12/31/2015   HDL 63.50 12/31/2015   LDLCALC 93 12/31/2015   ALT 17 12/31/2015   AST 19 12/31/2015   NA 137 12/31/2015   K 3.8 12/31/2015   CL 99 12/31/2015   CREATININE 0.99 12/31/2015   BUN 16 12/31/2015   CO2 33 (H) 12/31/2015   TSH 0.94 12/31/2015   PSA 1.03 12/31/2015    Dg Chest 2 View  Result Date: 03/17/2016 CLINICAL DATA:  Wheezing and shortness of breath, history of sarcoidosis EXAM: CHEST  2 VIEW COMPARISON:  12/16/2015 FINDINGS: Stable bilateral hilar fullness is noted consistent with the given clinical history. The cardiac shadow is stable. The lungs are well-aerated without focal infiltrate or sizable effusion. No bony abnormality is seen. IMPRESSION: No acute abnormality noted. Stable hilar adenopathy. Electronically Signed   By: Alcide Clever M.D.   On: 03/17/2016 14:50    Assessment & Plan:   [name] was seen today for knee pain, hypertension and back pain.  Diagnoses and all orders for this visit:  Need for prophylactic vaccination and inoculation against influenza -     Flu Vaccine QUAD 36+ mos IM  Left lumbar radiculitis -     HYDROcodone-acetaminophen (NORCO/VICODIN) 5-325 MG tablet; Take 1 tablet by mouth every 6 (six) hours as needed for moderate pain.  Essential hypertension, benign- His blood pressure is not well controlled but he is not willing to start an antihypertensive  medication at this time. He is currently taking a course of prednisone that was prescribed by pain management, this may elevate his blood pressure. I will recheck his blood pressure in 4-6 weeks and advise further.  Acute knee pain, left- plain films are positive for tricompartment DJD, will continue Norco and naproxen for symptom relief. -     DG Knee Complete 4 Views Right; Future -     HYDROcodone-acetaminophen (NORCO/VICODIN) 5-325 MG tablet; Take 1 tablet by mouth every 6 (six) hours as needed for moderate pain.  Degenerative disc disease, lumbar -     HYDROcodone-acetaminophen (NORCO/VICODIN) 5-325 MG tablet; Take 1 tablet by mouth every 6 (six) hours as needed for moderate pain.   I have discontinued Mr. Hillery cyclobenzaprine and predniSONE. I am also having him maintain his albuterol, naproxen, mometasone-formoterol, methocarbamol, and HYDROcodone-acetaminophen.  Meds ordered this encounter  Medications  . methocarbamol (ROBAXIN) 500 MG tablet    Refill:  2  . HYDROcodone-acetaminophen (NORCO/VICODIN) 5-325 MG tablet    Sig: Take 1 tablet by mouth every 6 (six) hours as needed for moderate pain.    Dispense:  75 tablet    Refill:  0     Follow-up: Return in about 6 weeks (around 07/05/2016).  Sanda Lingerhomas Deette Revak, MD

## 2016-05-24 NOTE — Telephone Encounter (Signed)
Left message for pt regarding.   Printed letter. Pt informed.  Fax sent.

## 2016-05-24 NOTE — Patient Instructions (Signed)

## 2016-05-24 NOTE — Progress Notes (Signed)
Pre visit review using our clinic review tool, if applicable. No additional management support is needed unless otherwise documented below in the visit note. 

## 2016-05-25 ENCOUNTER — Ambulatory Visit: Payer: 59

## 2016-06-01 ENCOUNTER — Ambulatory Visit: Payer: 59

## 2016-06-01 DIAGNOSIS — R252 Cramp and spasm: Secondary | ICD-10-CM | POA: Diagnosis not present

## 2016-06-01 DIAGNOSIS — M545 Low back pain: Secondary | ICD-10-CM | POA: Diagnosis not present

## 2016-06-01 NOTE — Therapy (Addendum)
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 Asthma   . Sarcoidosis ([place])   . Sarcoidosis ([place])   . Sickle cell anemia ([facility])   . Sickle cell trait [facility])     Past Surgical History:  Procedure Laterality Date  . I&D EXTREMITY  01/19/2012   Procedure: IRRIGATION AND DEBRIDEMENT EXTREMITY;  Surgeon: Newt Minion, MD;  Location: [place];  Service: Orthopedics;  Laterality: Right;  . SHOULDER OPEN ROTATOR CUFF REPAIR  2007    There were no vitals filed for this visit.      Subjective Assessment - 06/01/16 1333    Subjective Couldn't come as my knee swelled from either meds or sacoidosis.  Injection next week. Prednisone helped back pan until 3 days ago the pain returned   Currently in Pain? Yes   Pain Score 8    Pain Location Back   Pain Orientation Posterior;Left   Pain Type Chronic pain   Pain Radiating Towards into LT leg anterior thigh to lower leg   Pain Onset More than a month ago   Pain Frequency Constant   Aggravating Factors  twist and bend   Pain Relieving Factors meds , heat , stim    Multiple Pain Sites No            [facility] PT Assessment - 06/01/16 1336      Assessment   Medical Diagnosis lumbar radiculopathy, facet arthropathy     AROM   Lumbar Flexion he can reach distal  tibia   Lumbar Extension 20  back nad leg pain LT   Lumbar - Right Side Bend 25   Lumbar - Left Side Bend 26     Strength   Overall Strength Comments LE strength overall WNL with some mild hesitation with LT LE testing                              PT Education - 06/01/16 1402    Education provided Yes   Education Details prone press ups   Person(s) Educated Patient   Methods Explanation;Demonstration;Verbal cues;Handout   Comprehension Returned demonstration;Verbalized understanding          PT Short Term Goals - 05/13/16 1400      PT SHORT TERM GOAL #1   Title He will be independent with inital HEP   Time 2   Period Weeks   Status Achieved     PT SHORT TERM GOAL #2   Title He wil report pain decreased 25% or more in leg   Baseline no change 05/13/2016   Time  3   Period Weeks   Status On-going     PT SHORT TERM GOAL #3   Title He will report improved tolerance for walking , standing and sitting with pain  decr 35% or more and increased to > 30 min for each   Baseline can stand now for 30 minutes however still leg pain   Time 3   Period Weeks   Status Partially Met     PT SHORT TERM GOAL #4   Title PAssive knee flexion prone full with min to no quad pain RT and LT   Baseline quad stretch felt right, Low back pain felt left. Left ROM decreased compared to right   Time 3   Period Weeks   Status On-going     PT SHORT TERM GOAL #5   Title He will walk 500 feet or more without increased pain   Baseline 500 ft with pain 7/10, did not go up   Time 3   Period Weeks   Status Partially Met           PT Long Term Goals - 05/13/16 1403      PT LONG TERM GOAL #1   Title He will be independent with all HEP issued   Time 6   Period Weeks   Status On-going     PT LONG TERM GOAL #2   Title He will report pain in leg  intermittant and decr 75% or more   Time 6   Period Weeks   Status On-going     PT LONG TERM GOAL #3   Title He will be able to touch his toes with min to no pain to allow for bending at work   Baseline distal tibia 05/13/16 improved from mids tibia   Time 6   Period Weeks   Status Unable to assess     PT LONG TERM GOAL #4   Title He will be able to stand at work for 60 min or more without increased pain   Baseline 10 minutes max   Time 6   Period Weeks     PT LONG TERM GOAL #5   Title He will be able to sit for 45-60 min without incr pain   Baseline 20 minutes max   Time 6   Period Weeks     PT LONG TERM GOAL #6   Title He will be able to lift 30 pounds x10 floor to waist with 1-2/10 pain to begin to tolerate lifting at work   Time 6   Period Weeks   Status Unable to assess               Plan - 06/01/16 1340    Clinical Impression Statement Visible indicators of pain with end range movement of spine and sometimes with transitional  movement but able to move with good speed . Gait pattern normal and when in supine and sitting postions no real indication of pain. Steroids helped pain so injection may give some releif. Pain levels unchange from intial Eval. He does appear better  than I remember at eval but with no eal report of pain improvement it does not appear PT  indicated until after injection.     PT Frequency 2x / week   PT Duration 3 weeks  to assess benefit of injection and progress with PT   PT Treatment/Interventions Cryotherapy;Electrical Stimulation;Iontophoresis '4mg'$ /ml Dexamethasone;Moist Heat;Ultrasound;Therapeutic exercise;Patient/family education;Passive range of motion;Dry needling;Manual techniques;Taping   PT Next Visit Plan Hold  until after injections and see if more visits authorized   Consulted and Agree with Plan of Care Patient      Patient will benefit from skilled therapeutic intervention in order to improve the following deficits and  impairments:  Pain, Postural dysfunction, Increased muscle spasms, Decreased activity tolerance, Decreased range of motion  Visit Diagnosis: Bilateral low back pain, with sciatica presence unspecified - Plan: PT plan of care cert/re-cert  Cramp and spasm - Plan: PT plan of care cert/re-cert     Problem List Patient Active Problem List   Diagnosis Date Noted  . Essential hypertension, benign 05/24/2016  . Acute knee pain 05/24/2016  . Numbness and tingling in right hand 02/23/2016  . Left lumbar radiculitis 02/04/2016  . Routine general medical examination at a health care facility 12/31/2015  . Degenerative disc disease, lumbar 12/31/2015  . Alcohol abuse 01/20/2009  . ALLERGIC RHINITIS 01/20/2009  . Severe persistent chronic asthma without complication 68/34/1962  . Sarcoidosis ([place]) 11/10/2007    Darrel Hoover  PT 06/01/2016, 2:10 PM  [facility] [address] [place], [place], [address] Phone: [phone]   Fax:  [phone]  Name: [name] MRN: [id number] Date of Birth: [birth year]  PHYSICAL THERAPY DISCHARGE SUMMARY  Visits from Start of Care: 9  Current functional level related to goals / functional outcomes: See above   Remaining deficits: See above. He has seen his MD and reported to MD PT was not helpful   Education / Equipment: HEP Plan: Patient agrees to discharge.  Patient goals were partially met. Patient is being discharged due to                                                     ?????    Lack of progress.   Lillette Boxer Jaysha Lasure  PT   07/19/16    2:55 PM

## 2016-06-01 NOTE — Patient Instructions (Signed)
From cabinet prone press ups x2x5 reps  For home 1-3x/day, 3-5 sec hold relaxing pelvis and hips

## 2016-06-03 MED FILL — HYDROCODON-APAP 5-325: 5-325 | 18 days supply | Qty: 75 | Fill #0

## 2016-06-15 ENCOUNTER — Ambulatory Visit: Payer: 59 | Attending: Orthopedic Surgery

## 2016-06-15 DIAGNOSIS — M545 Low back pain: Secondary | ICD-10-CM | POA: Diagnosis not present

## 2016-06-15 DIAGNOSIS — R252 Cramp and spasm: Secondary | ICD-10-CM | POA: Insufficient documentation

## 2016-06-15 NOTE — Therapy (Signed)
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 Asthma   . Sarcoidosis ([place])   . Sarcoidosis ([place])   . Sickle cell anemia (H[facility])   . Sickle cell trait [facility])     Past Surgical History:  Procedure Laterality Date  . I&D EXTREMITY  01/19/2012   Procedure: IRRIGATION AND DEBRIDEMENT EXTREMITY;  Surgeon: Newt Minion, MD;  Location: [facility];  Service: Orthopedics;  Laterality: Right;  . SHOULDER OPEN ROTATOR CUFF REPAIR  2007    There were no vitals filed for this visit.      Subjective Assessment - 06/15/16 1410    Subjective No pain in lower back post injection but pain still in legs and LT buttock. Pain worse in legs post injection and can't sleep well now.  Nurse said to put ice and meds . Staid home 3 days  post shot then returned to work.     Currently in Pain? Yes   Pain Score 9    Pain Location Leg   Pain Orientation Left;Anterior   Pain Descriptors / Indicators Constant   Pain Type Chronic pain   Pain Radiating Towards Lt anterior thigh   Pain Onset More than a month ago   Pain Frequency Constant   Aggravating Factors  twist and bend , work with machines   Pain Relieving Factors mes ,  TENS   Multiple Pain Sites No                         [facility] Adult PT Treatment/Exercise - 06/15/16 0001      Modalities   Modalities Traction     Traction   Type of Traction Lumbar   Min (lbs) 15   Max (lbs) 40   Hold Time 90   Rest Time 15   Time 15                  PT Short Term Goals - 05/13/16 1400      PT SHORT TERM GOAL #1   Title He will be independent with inital HEP   Time 2   Period Weeks   Status Achieved     PT SHORT TERM GOAL #2   Title He wil report pain decreased 25% or more in leg   Baseline no change 05/13/2016   Time 3   Period Weeks   Status On-going     PT SHORT TERM GOAL #3   Title He will report improved tolerance for walking , standing and sitting with pain  decr 35% or more and increased to > 30 min for each  Baseline can stand now for 30 minutes however still leg pain   Time 3   Period Weeks   Status Partially Met     PT SHORT TERM GOAL #4   Title PAssive knee flexion prone full with min to no quad pain RT and LT   Baseline quad stretch felt right, Low back pain felt left. Left ROM decreased compared to right   Time 3   Period Weeks   Status On-going     PT SHORT TERM GOAL #5   Title He will walk 500 feet or more without increased pain   Baseline 500 ft with pain 7/10, did not go up   Time 3   Period Weeks   Status Partially Met           PT Long Term Goals - 05/13/16 1403      PT LONG TERM GOAL #1   Title He will be independent with all HEP issued   Time 6   Period Weeks   Status On-going     PT LONG TERM GOAL #2   Title He will report pain in leg intermittant and decr 75% or more   Time 6   Period Weeks   Status On-going     PT LONG TERM GOAL #3   Title He will be able to touch his toes with min to no pain to allow for bending at work   Baseline distal tibia 05/13/16 improved from mids tibia   Time 6   Period Weeks   Status Unable to assess     PT LONG TERM GOAL #4   Title He will be  able to stand at work for 60 min or more without increased pain   Baseline 10 minutes max   Time 6   Period Weeks     PT LONG TERM GOAL #5   Title He will be able to sit for 45-60 min without incr pain   Baseline 20 minutes max   Time 6   Period Weeks     PT LONG TERM GOAL #6   Title He will be able to lift 30 pounds x10 floor to waist with 1-2/10 pain to begin to tolerate lifting at work   Time 6   Period Weeks   Status Unable to assess               Plan - 06/15/16 1405    Clinical Impression Statement Today he returns after injection that helped with back pain but leg pain into LT thigh is worse now at level of 9/10 today. He has not responded to anything we have done in past. Traction has only been tried once (and [name] repported it had not been tried) so will do a trial of 2-3 sessions to use up authorized visits but if it makes him worse will stop PT and discharge to MD for management. He was no better or worse after todays session   PT Treatment/Interventions Cryotherapy;Electrical Stimulation;Iontophoresis 46m/ml Dexamethasone;Moist Heat;Ultrasound;Therapeutic exercise;Patient/family education;Passive range of motion;Dry needling;Manual techniques;Taping   PT Next Visit Plan continue to finish 2 visits left with traction and extension treatment and possible discharge   Consulted and Agree with Plan of Care Patient      Patient will benefit from skilled therapeutic intervention in order to improve the following deficits and impairments:  Pain, Postural dysfunction, Increased muscle spasms, Decreased activity tolerance, Decreased range of motion  Visit Diagnosis: Bilateral low back pain, with sciatica presence unspecified  Cramp and spasm     Problem List Patient Active Problem List   Diagnosis Date Noted  . Essential hypertension, benign 05/24/2016  . Acute knee pain 05/24/2016  . Numbness and tingling in right hand 02/23/2016  . Left lumbar radiculitis  02/04/2016  . Routine general medical examination at a health care facility 12/31/2015  . Degenerative disc disease, lumbar 12/31/2015  . Alcohol abuse 01/20/2009  . ALLERGIC RHINITIS 01/20/2009  . Severe persistent chronic asthma without complication 55/97/4163  . Sarcoidosis ([place]) 11/10/2007    Darrel Hoover  PT 06/15/2016, 2:43 PM  [facility] [address] [place], [place], [address] Phone: [phone]   Fax:  [phone]  Name: [name] MRN: [id number] Date of Birth: 06/11/1966

## 2016-06-17 ENCOUNTER — Ambulatory Visit: Payer: 59

## 2016-06-17 DIAGNOSIS — M545 Low back pain: Secondary | ICD-10-CM | POA: Diagnosis not present

## 2016-06-17 DIAGNOSIS — R252 Cramp and spasm: Secondary | ICD-10-CM | POA: Diagnosis not present

## 2016-06-17 NOTE — Therapy (Signed)
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 Asthma   . Sarcoidosis ([place])   . Sarcoidosis ([place])   . Sickle cell anemia ([facility])   . Sickle cell trait [facility]     Past Surgical History:  Procedure Laterality Date  . I&D EXTREMITY  01/19/2012   Procedure: IRRIGATION AND DEBRIDEMENT EXTREMITY;  Surgeon: Newt Minion, MD;  Location: [place];  Service: Orthopedics;  Laterality: Right;  . SHOULDER OPEN ROTATOR CUFF REPAIR  2007    There were no vitals filed for this visit.      Subjective Assessment - 06/17/16 1506    Subjective It about the same Did stretching  with back bending. Pain in waist started back    Currently in Pain? Yes   Pain Score 7    Pain Location Back   Pain Orientation Left;Anterior   Pain Descriptors / Indicators Constant   Pain Type Chronic pain   Pain Radiating Towards Lt ant thigh   Pain Onset More than a month ago   Pain Frequency Constant   Aggravating Factors  twist   Pain Relieving Factors meds , TENS , stretch   Multiple Pain Sites No                         [facility] Adult PT Treatment/Exercise - 06/17/16 1510      Lumbar Exercises: Stretches   Prone  on Elbows Stretch 2 reps;60 seconds   Prone on Elbows Stretch Limitations no leg pain only back pain   Press Ups 5 reps  3 sets , 5 sec hold     Traction   Type of Traction Lumbar   Min (lbs) 15   Max (lbs) 60   Hold Time 90   Rest Time 15   Time 18                  PT Short Term Goals - 05/13/16 1400      PT SHORT TERM GOAL #1   Title He will be independent with inital HEP   Time 2   Period Weeks   Status Achieved     PT SHORT TERM GOAL #2   Title He wil report pain decreased 25% or more in leg   Baseline no change 05/13/2016   Time 3   Period Weeks   Status On-going     PT SHORT TERM GOAL #3   Title He will report improved tolerance for walking , standing and sitting with pain  decr 35% or more and increased to > 30 min for each   Baseline can  stand now for 30 minutes however still leg pain   Time 3   Period Weeks   Status Partially Met     PT SHORT TERM GOAL #4   Title PAssive knee flexion prone full with min to no quad pain RT and LT   Baseline quad stretch felt right, Low back pain felt left. Left ROM decreased compared to right   Time 3   Period Weeks   Status On-going     PT SHORT TERM GOAL #5   Title He will walk 500 feet or more without increased pain   Baseline 500 ft with pain 7/10, did not go up   Time 3   Period Weeks   Status Partially Met           PT Long Term Goals - 05/13/16 1403      PT LONG TERM GOAL #1   Title He will be independent with all HEP issued   Time 6   Period Weeks   Status On-going     PT LONG TERM GOAL #2   Title He will report pain in leg intermittant and decr 75% or more   Time 6   Period Weeks   Status On-going     PT LONG TERM GOAL #3   Title He will be able to touch his toes with min to no pain to allow for bending at work   Baseline distal tibia 05/13/16 improved from mids tibia   Time 6   Period Weeks   Status Unable to assess     PT LONG TERM GOAL #4   Title He will be able to stand at work  for 60 min or more without increased pain   Baseline 10 minutes max   Time 6   Period Weeks     PT LONG TERM GOAL #5   Title He will be able to sit for 45-60 min without incr pain   Baseline 20 minutes max   Time 6   Period Weeks     PT LONG TERM GOAL #6   Title He will be able to lift 30 pounds x10 floor to waist with 1-2/10 pain to begin to tolerate lifting at work   Time 6   Period Weeks   Status Unable to assess               Plan - 06/17/16 1509    Clinical Impression Statement His pain levels were down some today. His gait pattern is normal. After extension exercises he had no leg symptoms  only back pain but as soon as he stood is leg pain returned immediately. Will see 1x more and if pain levels not changed and leg pain does not remain at least short periods with sit or standing  will discharge.  Post traction his leg pain was better but again  returned after standing   PT Treatment/Interventions Cryotherapy;Electrical Stimulation;Iontophoresis '4mg'$ /ml Dexamethasone;Moist Heat;Ultrasound;Therapeutic exercise;Patient/family education;Passive range of motion;Dry needling;Manual techniques;Taping   PT Next Visit Plan continue to finish 1 visit left with traction and extension treatment and possible discharge   Consulted and Agree with Plan of Care Patient      Patient will benefit from skilled therapeutic intervention in order to improve the following deficits and impairments:  Pain, Postural dysfunction, Increased muscle spasms, Decreased activity tolerance, Decreased range of motion, Cardiopulmonary status limiting activity  Visit Diagnosis: Bilateral low back pain, with sciatica presence unspecified  Cramp and spasm     Problem  List Patient Active Problem List   Diagnosis Date Noted  . Essential hypertension, benign 05/24/2016  . Acute knee pain 05/24/2016  . Numbness and tingling in right hand 02/23/2016  . Left lumbar radiculitis 02/04/2016  . Routine  general medical examination at a health care facility 12/31/2015  . Degenerative disc disease, lumbar 12/31/2015  . Alcohol abuse 01/20/2009  . ALLERGIC RHINITIS 01/20/2009  . Severe persistent chronic asthma without complication 43/32/9518  . Sarcoidosis ([place]) 11/10/2007    Darrel Hoover  PT 06/17/2016, 3:29 PM  [place] [facility] [address] [place], [place], [address] Phone: [phone]   Fax:  [phone]  Name: [name] MRN: [id number] Date of Birth: Feb 17, 1966

## 2016-06-22 ENCOUNTER — Ambulatory Visit: Payer: 59

## 2016-06-24 ENCOUNTER — Telehealth: Payer: Self-pay | Admitting: Internal Medicine

## 2016-06-24 DIAGNOSIS — M5416 Radiculopathy, lumbar region: Secondary | ICD-10-CM

## 2016-06-24 DIAGNOSIS — M25562 Pain in left knee: Secondary | ICD-10-CM

## 2016-06-24 DIAGNOSIS — M5136 Other intervertebral disc degeneration, lumbar region: Secondary | ICD-10-CM

## 2016-06-24 NOTE — Telephone Encounter (Signed)
Patient states [facility] will not prescribe pain medication.  He will be out of medication over the weekend.  Can another provider give him a small script to get him through until?

## 2016-06-25 MED ORDER — HYDROCODONE-ACETAMINOPHEN 5-325 MG PO TABS: 1.0000 | ORAL_TABLET | Freq: Four times a day (QID) | ORAL | 0 refills | Status: DC | PRN

## 2016-06-25 NOTE — Telephone Encounter (Signed)
Pt informed rx is up front for pick up.  

## 2016-06-25 NOTE — Telephone Encounter (Signed)
Please advise in PCP absence.  

## 2016-06-25 NOTE — Telephone Encounter (Signed)
Small script provided.

## 2016-06-29 ENCOUNTER — Ambulatory Visit: Payer: 59

## 2016-06-29 DIAGNOSIS — M545 Low back pain: Secondary | ICD-10-CM | POA: Diagnosis not present

## 2016-06-29 DIAGNOSIS — R252 Cramp and spasm: Secondary | ICD-10-CM

## 2016-06-29 NOTE — Therapy (Addendum)
[place], [place], [address] Phone: [phone]   Fax:  [phone]  Physical Therapy Treatment/ Discharge  Patient Details  Name: [name] MRN: [id number] Date of Birth: 1965/11/08 Referring Provider: Frederik Pear , MD    Ryan Simons, MD  Encounter Date: 06/29/2016      PT End of Session - 06/29/16 1521    Visit Number 12   Number of Visits 12   Authorization Type workers compensation   PT Start Time 0308   PT Stop Time 0325   PT Time Calculation (min) 17 min   Activity Tolerance Patient tolerated treatment well   Behavior During Therapy [facility] for tasks assessed/performed      Past Medical History:  Diagnosis Date  . Asthma   . Sarcoidosis ([place])   . Sarcoidosis ([place])   . Sickle cell anemia ([facility])   . Sickle cell trait [facility])     Past Surgical History:  Procedure Laterality Date  . I&D EXTREMITY  01/19/2012   Procedure: IRRIGATION AND DEBRIDEMENT EXTREMITY;  Surgeon: Newt Minion, MD;  Location: [facility];  Service: Orthopedics;  Laterality: Right;  . SHOULDER OPEN ROTATOR CUFF REPAIR  2007    There were no vitals filed for this visit.      Subjective Assessment - 06/29/16 1500    Subjective He reports he is not better.    Pain Score 7    Pain Location Back   Pain Orientation Left   Pain Descriptors / Indicators Constant   Pain Type Chronic pain   Pain Radiating Towards Lt anteriorthigh   Pain Onset More than a month ago   Pain Frequency Constant   Aggravating Factors  twisting   Pain Relieving Factors meds , TENS, stretch            [facility] PT Assessment - 06/29/16 0001      AROM   Lumbar Flexion he can reach distal  tibia   Lumbar Extension 20  back and leg pain   Lumbar - Right Side Bend 25   Lumbar - Left Side Bend 26     Strength   Overall Strength Comments LE strength overall WNL with some mild hesitation with LT LE testing      Flexibility   Hamstrings 65  degrees bilaterlaly       Reviewed extension exercises for home and need to stretch regularly and he reports he does. He is able to do correctly                         PT Short Term Goals - 06/29/16 1536      PT SHORT TERM GOAL #1   Title He will be independent with inital HEP   Status Achieved     PT SHORT TERM GOAL #2   Title He wil report pain decreased 25% or more in leg   Status Not Met     PT SHORT TERM GOAL #3   Title He will report improved tolerance for walking , standing and sitting with pain  decr 35% or more and increased to > 30 min for each   Baseline can stand now for 30 minutes however still leg pain   Status Partially Met     PT SHORT TERM GOAL #4   Title PAssive knee flexion prone full with min to no quad pain RT and LT   Baseline quad stretch felt right, Low back pain  felt left. Left ROM decreased compared to right   Status Partially Met     PT SHORT TERM GOAL #5   Title He will walk 500 feet or more without increased pain   Baseline 500 ft with pain 7/10, did not go up   Status Partially Met           PT Long Term Goals - 06/29/16 1537      PT LONG TERM GOAL #1   Title He will be independent with all HEP issued   Status Achieved     PT LONG TERM GOAL #2   Title He will report pain in leg intermittant and decr 75% or more   Status Not Met     PT LONG TERM GOAL #3   Title He will be able to touch his toes with min to no pain to allow for bending at work   Status Not Met     PT LONG TERM GOAL #4   Title He will be able to stand at work for 60 min or more without increased pain   Status Not Met     PT LONG TERM GOAL #5   Title He will be able to sit for 45-60 min without incr pain   Status Not Met     PT LONG TERM GOAL #6   Title He will be able to lift 30 pounds x10 floor to waist with 1-2/10 pain to begin to tolerate lifting at work   Status Not Met               Plan - 06/29/16 1522    Clinical Impression  Statement [name] reports no improvement even though he is able to centralize his pain with extension he continues with Lt thigh pain and back pain. He is doing all exercise na dmoves better than initail visit. His movement patterns are better. Generally after centralization his leg pain returns in a very short time or immediately.  PT has not been able to provide consitent releif  and [name] reports his MD is looking into nerve block and other options to help his nerve pain. He agreed to discharge    PT Treatment/Interventions Cryotherapy;Electrical Stimulation;Iontophoresis '4mg'$ /ml Dexamethasone;Moist Heat;Ultrasound;Therapeutic exercise;Patient/family education;Passive range of motion;Dry needling;Manual techniques;Taping   PT Next Visit Plan Pt to continue HEP and will be discharged today.    Consulted and Agree with Plan of Care Patient      Patient will benefit from skilled therapeutic intervention in order to improve the following deficits and impairments:  Pain, Postural dysfunction, Increased muscle spasms, Decreased activity tolerance, Decreased range of motion, Cardiopulmonary status limiting activity  Visit Diagnosis: Bilateral low back pain, with sciatica presence unspecified  Cramp and spasm     Problem List Patient Active Problem List   Diagnosis Date Noted  . Essential hypertension, benign 05/24/2016  . Acute knee pain 05/24/2016  . Numbness and tingling in right hand 02/23/2016  . Left lumbar radiculitis 02/04/2016  . Routine general medical examination at a health care facility 12/31/2015  . Degenerative disc disease, lumbar 12/31/2015  . Alcohol abuse 01/20/2009  . ALLERGIC RHINITIS 01/20/2009  . Severe persistent chronic asthma without complication 15/17/6160  . Sarcoidosis ([place]) 11/10/2007    [name]  PT  06/29/2016, 3:38 PM  [facility] [address] [place], [place], [address] Phone:  [phone]   Fax:  [phone]  Name: [name] MRN: [id number] Date of Birth: 12-28-1965  PHYSICAL THERAPY DISCHARGE SUMMARY  Visits from Start of Care: 12  Current functional level related to goals / functional outcomes: See above    Remaining deficits: See Above   Education / Equipment: HEP Plan: Patient agrees to discharge.  Patient goals were partially met. Patient is being discharged due to lack of progress.  ?????

## 2016-07-01 MED FILL — HYDROCODON-APAP 5-325: 5-325 | 3 days supply | Qty: 15 | Fill #0

## 2016-07-01 MED FILL — traMADol HCL 50 MG TABS: 50 | 15 days supply | Qty: 30 | Fill #0

## 2016-07-05 ENCOUNTER — Ambulatory Visit (INDEPENDENT_AMBULATORY_CARE_PROVIDER_SITE_OTHER): Payer: 59 | Admitting: Internal Medicine

## 2016-07-05 ENCOUNTER — Encounter: Payer: Self-pay | Admitting: Internal Medicine

## 2016-07-05 VITALS — BP 130/100 | HR 104 | Temp 98.1°F | Resp 20 | Ht 69.5 in | Wt 153.0 lb

## 2016-07-05 DIAGNOSIS — Z79891 Long term (current) use of opiate analgesic: Secondary | ICD-10-CM | POA: Diagnosis not present

## 2016-07-05 DIAGNOSIS — M5136 Other intervertebral disc degeneration, lumbar region: Secondary | ICD-10-CM | POA: Diagnosis not present

## 2016-07-05 DIAGNOSIS — J4550 Severe persistent asthma, uncomplicated: Secondary | ICD-10-CM

## 2016-07-05 DIAGNOSIS — I10 Essential (primary) hypertension: Secondary | ICD-10-CM | POA: Diagnosis not present

## 2016-07-05 DIAGNOSIS — M5416 Radiculopathy, lumbar region: Secondary | ICD-10-CM

## 2016-07-05 MED ORDER — MONTELUKAST SODIUM 10 MG PO TABS: 10.0000 mg | ORAL_TABLET | Freq: Every day | ORAL | 3 refills | Status: DC

## 2016-07-05 MED ORDER — HYDROCODONE-ACETAMINOPHEN 5-325 MG PO TABS: 1.0000 | ORAL_TABLET | Freq: Four times a day (QID) | ORAL | 0 refills | Status: DC | PRN

## 2016-07-05 MED ORDER — CHLORTHALIDONE 25 MG PO TABS: 25.0000 mg | ORAL_TABLET | Freq: Every day | ORAL | 1 refills | Status: DC

## 2016-07-05 MED FILL — CHLORTHALIDONE 25 MG TABLET: 25 | 90 days supply | Qty: 90 | Fill #0

## 2016-07-05 MED FILL — HYDROCODON-APAP 5-325: 5-325 | 18 days supply | Qty: 75 | Fill #0

## 2016-07-05 MED FILL — MONTELUKAST SOD 10 MG TAB: 10 | 90 days supply | Qty: 90 | Fill #0

## 2016-07-05 NOTE — Progress Notes (Signed)
Pre visit review using our clinic review tool, if applicable. No additional management support is needed unless otherwise documented below in the visit note. 

## 2016-07-05 NOTE — Patient Instructions (Signed)
Hypertension Hypertension, commonly called high blood pressure, is when the force of blood pumping through your arteries is too strong. Your arteries are the blood vessels that carry blood from your heart throughout your body. A blood pressure reading consists of a higher number over a lower number, such as 110/72. The higher number (systolic) is the pressure inside your arteries when your heart pumps. The lower number (diastolic) is the pressure inside your arteries when your heart relaxes. Ideally you want your blood pressure below 120/80. Hypertension forces your heart to work harder to pump blood. Your arteries may become narrow or stiff. Having untreated or uncontrolled hypertension can cause heart attack, stroke, kidney disease, and other problems. RISK FACTORS Some risk factors for high blood pressure are controllable. Others are not.  Risk factors you cannot control include:   Race. You may be at higher risk if you are African American.  Age. Risk increases with age.  Gender. Men are at higher risk than women before age 45 years. After age 65, women are at higher risk than men. Risk factors you can control include:  Not getting enough exercise or physical activity.  Being overweight.  Getting too much fat, sugar, calories, or salt in your diet.  Drinking too much alcohol. SIGNS AND SYMPTOMS Hypertension does not usually cause signs or symptoms. Extremely high blood pressure (hypertensive crisis) may cause headache, anxiety, shortness of breath, and nosebleed. DIAGNOSIS To check if you have hypertension, your health care provider will measure your blood pressure while you are seated, with your arm held at the level of your heart. It should be measured at least twice using the same arm. Certain conditions can cause a difference in blood pressure between your right and left arms. A blood pressure reading that is higher than normal on one occasion does not mean that you need treatment. If  it is not clear whether you have high blood pressure, you may be asked to return on a different day to have your blood pressure checked again. Or, you may be asked to monitor your blood pressure at home for 1 or more weeks. TREATMENT Treating high blood pressure includes making lifestyle changes and possibly taking medicine. Living a healthy lifestyle can help lower high blood pressure. You may need to change some of your habits. Lifestyle changes may include:  Following the DASH diet. This diet is high in fruits, vegetables, and whole grains. It is low in salt, red meat, and added sugars.  Keep your sodium intake below 2,300 mg per day.  Getting at least 30-45 minutes of aerobic exercise at least 4 times per week.  Losing weight if necessary.  Not smoking.  Limiting alcoholic beverages.  Learning ways to reduce stress. Your health care provider may prescribe medicine if lifestyle changes are not enough to get your blood pressure under control, and if one of the following is true:  You are 18-59 years of age and your systolic blood pressure is above 140.  You are 60 years of age or older, and your systolic blood pressure is above 150.  Your diastolic blood pressure is above 90.  You have diabetes, and your systolic blood pressure is over 140 or your diastolic blood pressure is over 90.  You have kidney disease and your blood pressure is above 140/90.  You have heart disease and your blood pressure is above 140/90. Your personal target blood pressure may vary depending on your medical conditions, your age, and other factors. HOME CARE INSTRUCTIONS    Have your blood pressure rechecked as directed by your health care provider.   Take medicines only as directed by your health care provider. Follow the directions carefully. Blood pressure medicines must be taken as prescribed. The medicine does not work as well when you skip doses. Skipping doses also puts you at risk for  problems.  Do not smoke.   Monitor your blood pressure at home as directed by your health care provider. SEEK MEDICAL CARE IF:   You think you are having a reaction to medicines taken.  You have recurrent headaches or feel dizzy.  You have swelling in your ankles.  You have trouble with your vision. SEEK IMMEDIATE MEDICAL CARE IF:  You develop a severe headache or confusion.  You have unusual weakness, numbness, or feel faint.  You have severe chest or abdominal pain.  You vomit repeatedly.  You have trouble breathing. MAKE SURE YOU:   Understand these instructions.  Will watch your condition.  Will get help right away if you are not doing well or get worse.   This information is not intended to replace advice given to you by your health care provider. Make sure you discuss any questions you have with your health care provider.   Document Released: 09/20/2005 Document Revised: 02/04/2015 Document Reviewed: 07/13/2013 Elsevier Interactive Patient Education 2016 [employer]  

## 2016-07-05 NOTE — Progress Notes (Signed)
Subjective:  Patient ID: [name], male    DOB: [birth year]  Age: 50 y.o. MRN: [id number]  CC: Hypertension; Back Pain; and Asthma   HPI [name] presents for follow-up on low back pain, asthma, and a blood pressure check. He tells me that since I last saw him he has seen a pain management doctor, Dr. Modesto Charon, and his had a steroid injection in his lower back. He has tried physical therapy and tells me it did not help him. He tells me it did not help much. He tells me Dr. Modesto Charon told him to come back to see me for refills on the Norco. He tells me the back pain does not radiate into his lower extremities and he denies paresthesias.  He is concerned about his blood pressure. He has had a few headaches recently. He denies blurred vision, chest pain, diaphoresis, palpitations, edema, syncope, or fatigue.  He continues to struggle with intermittent asthma and says he is compliant with the LABA/ICS inhaler. He is scheduled for PFTs later this week. He has an intermittent nonproductive cough and wheezing.  Outpatient Medications Prior to Visit  Medication Sig Dispense Refill  . albuterol (PROAIR HFA) 108 (90 Base) MCG/ACT inhaler Inhale 2 puffs into the lungs every 4 (four) hours as needed for wheezing. 1 Inhaler 1  . methocarbamol (ROBAXIN) 500 MG tablet   2  . naproxen (NAPROSYN) 500 MG tablet Take 1 tablet (500 mg total) by mouth 2 (two) times daily with a meal. 60 tablet 3  . HYDROcodone-acetaminophen (NORCO/VICODIN) 5-325 MG tablet Take 1 tablet by mouth every 6 (six) hours as needed for moderate pain. 15 tablet 0  . mometasone-formoterol (DULERA) 200-5 MCG/ACT AERO Inhale 2 puffs into the lungs 2 (two) times daily. 1 Inhaler 11   No facility-administered medications prior to visit.     ROS Review of Systems  Constitutional: Negative.  Negative for appetite change, chills, fatigue and fever.  HENT: Negative for congestion, facial swelling, sinus pressure and trouble swallowing.     Eyes: Negative.  Negative for visual disturbance.  Respiratory: Positive for cough and wheezing. Negative for apnea, choking, chest tightness, shortness of breath and stridor.   Cardiovascular: Negative.  Negative for chest pain, palpitations and leg swelling.  Gastrointestinal: Negative.  Negative for abdominal pain, constipation, diarrhea and nausea.  Endocrine: Negative.   Genitourinary: Negative.  Negative for difficulty urinating, dysuria, frequency, testicular pain and urgency.  Musculoskeletal: Positive for back pain. Negative for arthralgias, joint swelling, myalgias and neck pain.  Skin: Negative.  Negative for color change, pallor and rash.  Allergic/Immunologic: Negative.   Neurological: Positive for headaches. Negative for dizziness, tremors, seizures, syncope, facial asymmetry, speech difficulty, weakness and numbness.  Hematological: Negative.  Negative for adenopathy. Does not bruise/bleed easily.  Psychiatric/Behavioral: Negative.     Objective:  BP (!) 130/100 (BP Location: Left Arm, Patient Position: Sitting, Cuff Size: Normal)   Pulse (!) 104   Temp 98.1 F (36.7 C) (Oral)   Resp 20   Ht 5' 9.5" (1.765 m)   Wt 153 lb (69.4 kg)   SpO2 96%   BMI 22.27 kg/m   BP Readings from Last 3 Encounters:  07/05/16 (!) 130/100  05/24/16 (!) 148/98  05/18/16 134/78    Wt Readings from Last 3 Encounters:  07/05/16 153 lb (69.4 kg)  05/24/16 145 lb 7 oz (66 kg)  05/18/16 145 lb 9.6 oz (66 kg)    Physical Exam  Constitutional: He is oriented to person,  place, and time.  Non-toxic appearance. He does not have a sickly appearance. He does not appear ill. No distress.  HENT:  Mouth/Throat: Oropharynx is clear and moist. No oropharyngeal exudate.  Eyes: Conjunctivae are normal. Right eye exhibits no discharge. Left eye exhibits no discharge. No scleral icterus.  Neck: Normal range of motion. Neck supple. No JVD present. No tracheal deviation present. No thyromegaly present.   Cardiovascular: Normal rate, regular rhythm, normal heart sounds and intact distal pulses.  Exam reveals no gallop and no friction rub.   No murmur heard. EKG ----  Sinus  Rhythm  -Left axis.   -Right atrial enlargement.   ABNORMAL   Pulmonary/Chest: Effort normal and breath sounds normal. No accessory muscle usage or stridor. No tachypnea. No respiratory distress. He has no decreased breath sounds. He has no wheezes. He has no rhonchi. He has no rales. He exhibits no tenderness.  Abdominal: Soft. Bowel sounds are normal. He exhibits no distension and no mass. There is no tenderness. There is no rebound and no guarding.  Musculoskeletal: Normal range of motion. He exhibits no edema, tenderness or deformity.  Lymphadenopathy:    He has no cervical adenopathy.  Neurological: He is oriented to person, place, and time.  Neg SLR in BLE  Skin: Skin is warm and dry. No rash noted. He is not diaphoretic. No erythema. No pallor.  Psychiatric: He has a normal mood and affect. His behavior is normal. Judgment and thought content normal.  Vitals reviewed.   Lab Results  Component Value Date   WBC 4.6 12/31/2015   HGB 15.3 12/31/2015   HCT 44.3 12/31/2015   PLT 231.0 12/31/2015   GLUCOSE 90 12/31/2015   CHOL 188 12/31/2015   TRIG 158.0 (H) 12/31/2015   HDL 63.50 12/31/2015   LDLCALC 93 12/31/2015   ALT 17 12/31/2015   AST 19 12/31/2015   NA 137 12/31/2015   K 3.8 12/31/2015   CL 99 12/31/2015   CREATININE 0.99 12/31/2015   BUN 16 12/31/2015   CO2 33 (H) 12/31/2015   TSH 0.94 12/31/2015   PSA 1.03 12/31/2015    Dg Knee Complete 4 Views Right  Result Date: 05/24/2016 CLINICAL DATA:  Pain.  No known injury.  Initial evaluation EXAM: RIGHT KNEE - COMPLETE 4+ VIEW COMPARISON:  No recent prior . FINDINGS: No acute bony or joint abnormality identified. Prominent tricompartment degenerative change. Lucency noted over the lateral tibial plateau on one view only is most likely secondary to  overlying soft tissues. No definite evidence fracture of dislocation. IMPRESSION: Prominent tricompartment degenerative change. No acute bony abnormality. Electronically Signed   By: Maisie Fus  Register   On: 05/24/2016 10:30    Assessment & Plan:   [name] was seen today for hypertension, back pain and asthma.  Diagnoses and all orders for this visit:  Essential hypertension, benign- his EKG is negative for LVH but there is right ventricular hypertrophy consistent with his lung disease, will start a thiazide diuretic to control his blood pressure. -     chlorthalidone (HYGROTON) 25 MG tablet; Take 1 tablet (25 mg total) by mouth daily. -     EKG 12-Lead  Left lumbar radiculitis- I will continue Norco as needed for pain and he will follow up with his pain specialist as directed. -     HYDROcodone-acetaminophen (NORCO/VICODIN) 5-325 MG tablet; Take 1 tablet by mouth every 6 (six) hours as needed for moderate pain.  Degenerative disc disease, lumbar- as above -  HYDROcodone-acetaminophen (NORCO/VICODIN) 5-325 MG tablet; Take 1 tablet by mouth every 6 (six) hours as needed for moderate pain.  Severe persistent chronic asthma without complication- will continue the LABA/ICS combination but will also add a leukotriene antagonist for additional symptom relief. -     montelukast (SINGULAIR) 10 MG tablet; Take 1 tablet (10 mg total) by mouth at bedtime.   I am having [name] start on chlorthalidone and montelukast. I am also having him maintain his albuterol, naproxen, mometasone-formoterol, methocarbamol, and HYDROcodone-acetaminophen.  Meds ordered this encounter  Medications  . chlorthalidone (HYGROTON) 25 MG tablet    Sig: Take 1 tablet (25 mg total) by mouth daily.    Dispense:  90 tablet    Refill:  1  . HYDROcodone-acetaminophen (NORCO/VICODIN) 5-325 MG tablet    Sig: Take 1 tablet by mouth every 6 (six) hours as needed for moderate pain.    Dispense:  75 tablet    Refill:  0  .  montelukast (SINGULAIR) 10 MG tablet    Sig: Take 1 tablet (10 mg total) by mouth at bedtime.    Dispense:  90 tablet    Refill:  3     Follow-up: Return in about 6 weeks (around 08/16/2016).  Sanda Lingerhomas Maritsa Hunsucker, MD

## 2016-07-07 ENCOUNTER — Other Ambulatory Visit: Payer: Self-pay | Admitting: Internal Medicine

## 2016-07-07 DIAGNOSIS — R0600 Dyspnea, unspecified: Secondary | ICD-10-CM

## 2016-08-12 ENCOUNTER — Other Ambulatory Visit: Payer: Self-pay | Admitting: *Deleted

## 2016-08-12 NOTE — Telephone Encounter (Signed)
Called pt back to verify which med he is requesting a refill on the Hydrocodone. Inform med is not on med list he states he doesn't take it often.Marland Kitchen.Raechel Chute/lmb

## 2016-08-12 NOTE — Telephone Encounter (Signed)
Left msg on triage requesting refill on pain med

## 2016-08-13 NOTE — Telephone Encounter (Signed)
Called pt to inform that he needed to make his 6 week follow per the AVS on 07/05/2016. Pt has made a follow up for 08/17/16 at 9:15am.

## 2016-08-13 NOTE — Telephone Encounter (Signed)
Rec'd call pt requesting status on the Hydrocodone 5/32g mg inform MD is out of the office today may be Monday before he address msg...Raechel Chute/lmb

## 2016-08-17 ENCOUNTER — Encounter: Payer: Self-pay | Admitting: Internal Medicine

## 2016-08-17 ENCOUNTER — Ambulatory Visit (INDEPENDENT_AMBULATORY_CARE_PROVIDER_SITE_OTHER): Payer: 59 | Admitting: Internal Medicine

## 2016-08-17 VITALS — BP 150/110 | HR 87 | Temp 98.3°F | Resp 16 | Ht 69.5 in | Wt 151.0 lb

## 2016-08-17 DIAGNOSIS — I10 Essential (primary) hypertension: Secondary | ICD-10-CM

## 2016-08-17 DIAGNOSIS — M5136 Other intervertebral disc degeneration, lumbar region: Secondary | ICD-10-CM

## 2016-08-17 DIAGNOSIS — J4550 Severe persistent asthma, uncomplicated: Secondary | ICD-10-CM

## 2016-08-17 MED ORDER — HYDROCODONE-ACETAMINOPHEN 5-325 MG PO TABS: 1.0000 | ORAL_TABLET | Freq: Two times a day (BID) | ORAL | 0 refills | Status: DC | PRN

## 2016-08-17 MED ORDER — NEBIVOLOL HCL 5 MG PO TABS: 5.0000 mg | ORAL_TABLET | Freq: Every day | ORAL | 3 refills | Status: DC

## 2016-08-17 MED ORDER — MOMETASONE FURO-FORMOTEROL FUM 200-5 MCG/ACT IN AERO: 2.0000 | INHALATION_SPRAY | Freq: Two times a day (BID) | RESPIRATORY_TRACT | 11 refills | Status: DC

## 2016-08-17 MED FILL — HYDROCODON-APAP 5-325: 5-325 | 30 days supply | Qty: 60 | Fill #0

## 2016-08-17 NOTE — Progress Notes (Signed)
Pre visit review using our clinic review tool, if applicable. No additional management support is needed unless otherwise documented below in the visit note. 

## 2016-08-17 NOTE — Patient Instructions (Signed)
Hypertension Hypertension, commonly called high blood pressure, is when the force of blood pumping through your arteries is too strong. Your arteries are the blood vessels that carry blood from your heart throughout your body. A blood pressure reading consists of a higher number over a lower number, such as 110/72. The higher number (systolic) is the pressure inside your arteries when your heart pumps. The lower number (diastolic) is the pressure inside your arteries when your heart relaxes. Ideally you want your blood pressure below 120/80. Hypertension forces your heart to work harder to pump blood. Your arteries may become narrow or stiff. Having untreated or uncontrolled hypertension can cause heart attack, stroke, kidney disease, and other problems. What increases the risk? Some risk factors for high blood pressure are controllable. Others are not. Risk factors you cannot control include:  Race. You may be at higher risk if you are African American.  Age. Risk increases with age.  Gender. Men are at higher risk than women before age 45 years. After age 65, women are at higher risk than men. Risk factors you can control include:  Not getting enough exercise or physical activity.  Being overweight.  Getting too much fat, sugar, calories, or salt in your diet.  Drinking too much alcohol. What are the signs or symptoms? Hypertension does not usually cause signs or symptoms. Extremely high blood pressure (hypertensive crisis) may cause headache, anxiety, shortness of breath, and nosebleed. How is this diagnosed? To check if you have hypertension, your health care provider will measure your blood pressure while you are seated, with your arm held at the level of your heart. It should be measured at least twice using the same arm. Certain conditions can cause a difference in blood pressure between your right and left arms. A blood pressure reading that is higher than normal on one occasion does  not mean that you need treatment. If it is not clear whether you have high blood pressure, you may be asked to return on a different day to have your blood pressure checked again. Or, you may be asked to monitor your blood pressure at home for 1 or more weeks. How is this treated? Treating high blood pressure includes making lifestyle changes and possibly taking medicine. Living a healthy lifestyle can help lower high blood pressure. You may need to change some of your habits. Lifestyle changes may include:  Following the DASH diet. This diet is high in fruits, vegetables, and whole grains. It is low in salt, red meat, and added sugars.  Keep your sodium intake below 2,300 mg per day.  Getting at least 30-45 minutes of aerobic exercise at least 4 times per week.  Losing weight if necessary.  Not smoking.  Limiting alcoholic beverages.  Learning ways to reduce stress. Your health care provider may prescribe medicine if lifestyle changes are not enough to get your blood pressure under control, and if one of the following is true:  You are 18-59 years of age and your systolic blood pressure is above 140.  You are 60 years of age or older, and your systolic blood pressure is above 150.  Your diastolic blood pressure is above 90.  You have diabetes, and your systolic blood pressure is over 140 or your diastolic blood pressure is over 90.  You have kidney disease and your blood pressure is above 140/90.  You have heart disease and your blood pressure is above 140/90. Your personal target blood pressure may vary depending on your medical   conditions, your age, and other factors. Follow these instructions at home:  Have your blood pressure rechecked as directed by your health care provider.  Take medicines only as directed by your health care provider. Follow the directions carefully. Blood pressure medicines must be taken as prescribed. The medicine does not work as well when you skip  doses. Skipping doses also puts you at risk for problems.  Do not smoke.  Monitor your blood pressure at home as directed by your health care provider. Contact a health care provider if:  You think you are having a reaction to medicines taken.  You have recurrent headaches or feel dizzy.  You have swelling in your ankles.  You have trouble with your vision. Get help right away if:  You develop a severe headache or confusion.  You have unusual weakness, numbness, or feel faint.  You have severe chest or abdominal pain.  You vomit repeatedly.  You have trouble breathing. This information is not intended to replace advice given to you by your health care provider. Make sure you discuss any questions you have with your health care provider. Document Released: 09/20/2005 Document Revised: 02/26/2016 Document Reviewed: 07/13/2013 Elsevier Interactive Patient Education  2017 [employer]  

## 2016-08-21 NOTE — Progress Notes (Signed)
Subjective:  Patient ID: [name], male    DOB: [birth year]  Age: 50 y.o. MRN: [id number]  CC: Hypertension and Back Pain   HPI Ryan Rudyrone Kuc presents for a blood pressure check and follow-up on low back pain. I had been prescribing Norco for him but his recent urine drug screen was negative for hydrocodone so I discontinued it. He comes back complaining that he needs another prescription for Norco to control his back pain. He says he takes it mostly at night when the back pain keeps him awake. He says the back pain sometimes radiates into his left lower extremity. He denies paresthesias. He has received an epidural steroid injection but says it did not help.  He tells me his blood pressure has not been well controlled. He has had a few episodes of blurred vision but denies headache. He denies any recent episodes of chest pain or shortness of breath.  He has had a few episodes of wheezing and for some reason [facility]  Outpatient Medications Prior to Visit  Medication Sig Dispense Refill  . albuterol (PROAIR HFA) 108 (90 Base) MCG/ACT inhaler Inhale 2 puffs into the lungs every 4 (four) hours as needed for wheezing. 1 Inhaler 1  . chlorthalidone (HYGROTON) 25 MG tablet Take 1 tablet (25 mg total) by mouth daily. 90 tablet 1  . methocarbamol (ROBAXIN) 500 MG tablet   2  . montelukast (SINGULAIR) 10 MG tablet Take 1 tablet (10 mg total) by mouth at bedtime. 90 tablet 3  . naproxen (NAPROSYN) 500 MG tablet Take 1 tablet (500 mg total) by mouth 2 (two) times daily with a meal. 60 tablet 3  . mometasone-formoterol (DULERA) 200-5 MCG/ACT AERO Inhale 2 puffs into the lungs 2 (two) times daily. 1 Inhaler 11   No facility-administered medications prior to visit.     ROS Review of Systems  Constitutional: Negative.  Negative for diaphoresis and fatigue.  HENT: Negative.   Eyes: Negative for photophobia and visual disturbance.  Respiratory: Positive for wheezing.  Negative for cough, choking, chest tightness, shortness of breath and stridor.   Cardiovascular: Negative.  Negative for chest pain, palpitations and leg swelling.  Gastrointestinal: Negative.  Negative for abdominal pain, constipation, diarrhea, nausea and vomiting.  Endocrine: Negative.   Genitourinary: Negative.   Musculoskeletal: Positive for back pain. Negative for gait problem, myalgias and neck pain.  Skin: Negative.  Negative for color change.  Allergic/Immunologic: Negative.   Neurological: Negative.  Negative for dizziness, syncope, weakness, numbness and headaches.  Hematological: Negative.  Negative for adenopathy. Does not bruise/bleed easily.  Psychiatric/Behavioral: Negative.     Objective:  BP (!) 150/110 (BP Location: Left Arm, Patient Position: Sitting, Cuff Size: Normal)   Pulse 87   Temp 98.3 F (36.8 C) (Oral)   Resp 16   Ht 5' 9.5" (1.765 m)   Wt 151 lb (68.5 kg)   SpO2 97%   BMI 21.98 kg/m   BP Readings from Last 3 Encounters:  08/17/16 (!) 150/110  07/05/16 (!) 130/100  05/24/16 (!) 148/98    Wt Readings from Last 3 Encounters:  08/17/16 151 lb (68.5 kg)  07/05/16 153 lb (69.4 kg)  05/24/16 145 lb 7 oz (66 kg)    Physical Exam  Constitutional: He is oriented to person, place, and time. No distress.  HENT:  Mouth/Throat: Oropharynx is clear and moist. No oropharyngeal exudate.  Eyes: Conjunctivae are normal. Right eye exhibits no discharge. Left eye exhibits no discharge.  No scleral icterus.  Neck: Normal range of motion. Neck supple. No JVD present. No tracheal deviation present. No thyromegaly present.  Cardiovascular: Normal rate, regular rhythm, normal heart sounds and intact distal pulses.  Exam reveals no gallop and no friction rub.   No murmur heard. Pulmonary/Chest: Effort normal and breath sounds normal. No stridor. No respiratory distress. He has no wheezes. He has no rales. He exhibits no tenderness.  Abdominal: Soft. Bowel sounds are  normal. He exhibits no distension and no mass. There is no tenderness. There is no rebound and no guarding.  Musculoskeletal: Normal range of motion. He exhibits no edema, tenderness or deformity.  Lymphadenopathy:    He has no cervical adenopathy.  Neurological: He is alert and oriented to person, place, and time. He has normal reflexes. He displays normal reflexes. No cranial nerve deficit. He exhibits normal muscle tone. Coordination normal.  Neg SLR in BLE  Skin: Skin is warm and dry. No rash noted. He is not diaphoretic. No erythema. No pallor.  Psychiatric: He has a normal mood and affect. His behavior is normal. Judgment and thought content normal.    Lab Results  Component Value Date   WBC 4.6 12/31/2015   HGB 15.3 12/31/2015   HCT 44.3 12/31/2015   PLT 231.0 12/31/2015   GLUCOSE 90 12/31/2015   CHOL 188 12/31/2015   TRIG 158.0 (H) 12/31/2015   HDL 63.50 12/31/2015   LDLCALC 93 12/31/2015   ALT 17 12/31/2015   AST 19 12/31/2015   NA 137 12/31/2015   K 3.8 12/31/2015   CL 99 12/31/2015   CREATININE 0.99 12/31/2015   BUN 16 12/31/2015   CO2 33 (H) 12/31/2015   TSH 0.94 12/31/2015   PSA 1.03 12/31/2015    Dg Knee Complete 4 Views Right  Result Date: 05/24/2016 CLINICAL DATA:  Pain.  No known injury.  Initial evaluation EXAM: RIGHT KNEE - COMPLETE 4+ VIEW COMPARISON:  No recent prior . FINDINGS: No acute bony or joint abnormality identified. Prominent tricompartment degenerative change. Lucency noted over the lateral tibial plateau on one view only is most likely secondary to overlying soft tissues. No definite evidence fracture of dislocation. IMPRESSION: Prominent tricompartment degenerative change. No acute bony abnormality. Electronically Signed   By: Maisie Fus  Register   On: 05/24/2016 10:30    Assessment & Plan:   Parks was seen today for hypertension and back pain.  Diagnoses and all orders for this visit:  Essential hypertension, benign- his blood pressure is  not adequately well controlled, I will add nebivolol to the thiazide diuretic. -     nebivolol (BYSTOLIC) 5 MG tablet; Take 1 tablet (5 mg total) by mouth daily.  Severe persistent chronic asthma without complication -     mometasone-formoterol (DULERA) 200-5 MCG/ACT AERO; Inhale 2 puffs into the lungs 2 (two) times daily.  Degenerative disc disease, lumbar -     HYDROcodone-acetaminophen (NORCO/VICODIN) 5-325 MG tablet; Take 1 tablet by mouth 2 (two) times daily as needed for moderate pain.   I have discontinued Mr. [place]'s naproxen. I am also having him start on nebivolol and HYDROcodone-acetaminophen. Additionally, I am having him maintain his albuterol, methocarbamol, chlorthalidone, montelukast, and mometasone-formoterol.  Meds ordered this encounter  Medications  . mometasone-formoterol (DULERA) 200-5 MCG/ACT AERO    Sig: Inhale 2 puffs into the lungs 2 (two) times daily.    Dispense:  1 Inhaler    Refill:  11    Order Specific Question:   Lot Number?  Answer:   [id number]    Order Specific Question:   Expiration Date?    Answer:   01/03/2015    Order Specific Question:   Manufacturer?    Answer:   [employer] [[id number]]    Order Specific Question:   Quantity    Answer:   1    Comments:   1 sample at 8.8g/60 metered actuations  . nebivolol (BYSTOLIC) 5 MG tablet    Sig: Take 1 tablet (5 mg total) by mouth daily.    Dispense:  30 tablet    Refill:  3  . HYDROcodone-acetaminophen (NORCO/VICODIN) 5-325 MG tablet    Sig: Take 1 tablet by mouth 2 (two) times daily as needed for moderate pain.    Dispense:  60 tablet    Refill:  0     Follow-up: Return in about 4 weeks (around 09/14/2016).  Sanda Lingerhomas Cherilyn Sautter, MD

## 2016-09-15 ENCOUNTER — Ambulatory Visit (INDEPENDENT_AMBULATORY_CARE_PROVIDER_SITE_OTHER): Payer: 59 | Admitting: Internal Medicine

## 2016-09-15 ENCOUNTER — Encounter: Payer: Self-pay | Admitting: Internal Medicine

## 2016-09-15 VITALS — BP 118/80 | HR 83 | Temp 97.5°F | Resp 16 | Ht 69.5 in | Wt 149.1 lb

## 2016-09-15 DIAGNOSIS — I10 Essential (primary) hypertension: Secondary | ICD-10-CM | POA: Diagnosis not present

## 2016-09-15 DIAGNOSIS — J4550 Severe persistent asthma, uncomplicated: Secondary | ICD-10-CM

## 2016-09-15 MED ORDER — MOMETASONE FURO-FORMOTEROL FUM 200-5 MCG/ACT IN AERO: 2.0000 | INHALATION_SPRAY | Freq: Two times a day (BID) | RESPIRATORY_TRACT | 11 refills | Status: DC

## 2016-09-15 MED FILL — HYDROCODON-APAP 5-325: 5-325 | 15 days supply | Qty: 30 | Fill #0

## 2016-09-15 NOTE — Patient Instructions (Signed)
Hypertension Hypertension, commonly called high blood pressure, is when the force of blood pumping through your arteries is too strong. Your arteries are the blood vessels that carry blood from your heart throughout your body. A blood pressure reading consists of a higher number over a lower number, such as 110/72. The higher number (systolic) is the pressure inside your arteries when your heart pumps. The lower number (diastolic) is the pressure inside your arteries when your heart relaxes. Ideally you want your blood pressure below 120/80. Hypertension forces your heart to work harder to pump blood. Your arteries may become narrow or stiff. Having untreated or uncontrolled hypertension can cause heart attack, stroke, kidney disease, and other problems. What increases the risk? Some risk factors for high blood pressure are controllable. Others are not. Risk factors you cannot control include:  Race. You may be at higher risk if you are African American.  Age. Risk increases with age.  Gender. Men are at higher risk than women before age 45 years. After age 65, women are at higher risk than men. Risk factors you can control include:  Not getting enough exercise or physical activity.  Being overweight.  Getting too much fat, sugar, calories, or salt in your diet.  Drinking too much alcohol. What are the signs or symptoms? Hypertension does not usually cause signs or symptoms. Extremely high blood pressure (hypertensive crisis) may cause headache, anxiety, shortness of breath, and nosebleed. How is this diagnosed? To check if you have hypertension, your health care provider will measure your blood pressure while you are seated, with your arm held at the level of your heart. It should be measured at least twice using the same arm. Certain conditions can cause a difference in blood pressure between your right and left arms. A blood pressure reading that is higher than normal on one occasion does  not mean that you need treatment. If it is not clear whether you have high blood pressure, you may be asked to return on a different day to have your blood pressure checked again. Or, you may be asked to monitor your blood pressure at home for 1 or more weeks. How is this treated? Treating high blood pressure includes making lifestyle changes and possibly taking medicine. Living a healthy lifestyle can help lower high blood pressure. You may need to change some of your habits. Lifestyle changes may include:  Following the DASH diet. This diet is high in fruits, vegetables, and whole grains. It is low in salt, red meat, and added sugars.  Keep your sodium intake below 2,300 mg per day.  Getting at least 30-45 minutes of aerobic exercise at least 4 times per week.  Losing weight if necessary.  Not smoking.  Limiting alcoholic beverages.  Learning ways to reduce stress. Your health care provider may prescribe medicine if lifestyle changes are not enough to get your blood pressure under control, and if one of the following is true:  You are 18-59 years of age and your systolic blood pressure is above 140.  You are 60 years of age or older, and your systolic blood pressure is above 150.  Your diastolic blood pressure is above 90.  You have diabetes, and your systolic blood pressure is over 140 or your diastolic blood pressure is over 90.  You have kidney disease and your blood pressure is above 140/90.  You have heart disease and your blood pressure is above 140/90. Your personal target blood pressure may vary depending on your medical   conditions, your age, and other factors. Follow these instructions at home:  Have your blood pressure rechecked as directed by your health care provider.  Take medicines only as directed by your health care provider. Follow the directions carefully. Blood pressure medicines must be taken as prescribed. The medicine does not work as well when you skip  doses. Skipping doses also puts you at risk for problems.  Do not smoke.  Monitor your blood pressure at home as directed by your health care provider. Contact a health care provider if:  You think you are having a reaction to medicines taken.  You have recurrent headaches or feel dizzy.  You have swelling in your ankles.  You have trouble with your vision. Get help right away if:  You develop a severe headache or confusion.  You have unusual weakness, numbness, or feel faint.  You have severe chest or abdominal pain.  You vomit repeatedly.  You have trouble breathing. This information is not intended to replace advice given to you by your health care provider. Make sure you discuss any questions you have with your health care provider. Document Released: 09/20/2005 Document Revised: 02/26/2016 Document Reviewed: 07/13/2013 Elsevier Interactive Patient Education  2017 [employer]  

## 2016-09-15 NOTE — Progress Notes (Signed)
Subjective:  Patient ID: [name], male    DOB: 22-Dec-1965  Age: 50 y.o. MRN: [id number]  CC: Hypertension; Back Pain; and Asthma   HPI [name] presents for follow-up on hypertension, asthma, and chronic low back pain. He tells me that he was seen by a specialist at [name] and was told that surgery is not an option for his low back. He has been referred to a pain specialist to consider having a nerve block done. He is getting relief from the back pain with Norco. He has had some wheezing of last few weeks but unfortunately has not been using his Dulera inhaler. He offers no other complaints today. He tells me his blood pressure has been well controlled.  Outpatient Medications Prior to Visit  Medication Sig Dispense Refill  . albuterol (PROAIR HFA) 108 (90 Base) MCG/ACT inhaler Inhale 2 puffs into the lungs every 4 (four) hours as needed for wheezing. 1 Inhaler 1  . chlorthalidone (HYGROTON) 25 MG tablet Take 1 tablet (25 mg total) by mouth daily. 90 tablet 1  . HYDROcodone-acetaminophen (NORCO/VICODIN) 5-325 MG tablet Take 1 tablet by mouth 2 (two) times daily as needed for moderate pain. 60 tablet 0  . methocarbamol (ROBAXIN) 500 MG tablet   2  . montelukast (SINGULAIR) 10 MG tablet Take 1 tablet (10 mg total) by mouth at bedtime. 90 tablet 3  . nebivolol (BYSTOLIC) 5 MG tablet Take 1 tablet (5 mg total) by mouth daily. 30 tablet 3  . mometasone-formoterol (DULERA) 200-5 MCG/ACT AERO Inhale 2 puffs into the lungs 2 (two) times daily. 1 Inhaler 11   No facility-administered medications prior to visit.     ROS Review of Systems  Constitutional: Negative for appetite change, chills, diaphoresis, fatigue, fever and unexpected weight change.  HENT: Negative.   Eyes: Negative for visual disturbance.  Respiratory: Positive for wheezing. Negative for cough, choking, chest tightness and shortness of breath.   Cardiovascular: Negative for chest pain, palpitations and leg swelling.    Gastrointestinal: Negative for abdominal pain, constipation, diarrhea, nausea and vomiting.  Genitourinary: Negative.  Negative for difficulty urinating.  Musculoskeletal: Positive for back pain.  Skin: Negative.  Negative for color change and rash.  Allergic/Immunologic: Negative.   Neurological: Negative.  Negative for dizziness, weakness, numbness and headaches.  Hematological: Negative.  Negative for adenopathy. Does not bruise/bleed easily.  Psychiatric/Behavioral: Negative.     Objective:  BP 118/80 (BP Location: Left Arm, Patient Position: Sitting, Cuff Size: Normal)   Pulse 83   Temp 97.5 F (36.4 C) (Oral)   Resp 16   Ht 5' 9.5" (1.765 m)   Wt 149 lb 2 oz (67.6 kg)   SpO2 98%   BMI 21.71 kg/m   BP Readings from Last 3 Encounters:  09/15/16 118/80  08/17/16 (!) 150/110  07/05/16 (!) 130/100    Wt Readings from Last 3 Encounters:  09/15/16 149 lb 2 oz (67.6 kg)  08/17/16 151 lb (68.5 kg)  07/05/16 153 lb (69.4 kg)    Physical Exam  Constitutional: He is oriented to person, place, and time. No distress.  HENT:  Mouth/Throat: Oropharynx is clear and moist. No oropharyngeal exudate.  Eyes: Conjunctivae are normal. Right eye exhibits no discharge. Left eye exhibits no discharge. No scleral icterus.  Neck: Normal range of motion. Neck supple. No JVD present. No tracheal deviation present. No thyromegaly present.  Cardiovascular: Normal rate, regular rhythm, normal heart sounds and intact distal pulses.  Exam reveals no gallop and no  friction rub.   No murmur heard. Pulmonary/Chest: Effort normal and breath sounds normal. No stridor. No respiratory distress. He has no wheezes. He has no rales. He exhibits no tenderness.  Abdominal: Soft. Bowel sounds are normal. He exhibits no distension and no mass. There is no tenderness. There is no rebound and no guarding.  Musculoskeletal: Normal range of motion. He exhibits no edema, tenderness or deformity.  Lymphadenopathy:     He has no cervical adenopathy.  Neurological: He is oriented to person, place, and time.  Skin: Skin is warm and dry. No rash noted. He is not diaphoretic. No erythema. No pallor.  Vitals reviewed.   Lab Results  Component Value Date   WBC 4.6 12/31/2015   HGB 15.3 12/31/2015   HCT 44.3 12/31/2015   PLT 231.0 12/31/2015   GLUCOSE 90 12/31/2015   CHOL 188 12/31/2015   TRIG 158.0 (H) 12/31/2015   HDL 63.50 12/31/2015   LDLCALC 93 12/31/2015   ALT 17 12/31/2015   AST 19 12/31/2015   NA 137 12/31/2015   K 3.8 12/31/2015   CL 99 12/31/2015   CREATININE 0.99 12/31/2015   BUN 16 12/31/2015   CO2 33 (H) 12/31/2015   TSH 0.94 12/31/2015   PSA 1.03 12/31/2015    Dg Knee Complete 4 Views Right  Result Date: 05/24/2016 CLINICAL DATA:  Pain.  No known injury.  Initial evaluation EXAM: RIGHT KNEE - COMPLETE 4+ VIEW COMPARISON:  No recent prior . FINDINGS: No acute bony or joint abnormality identified. Prominent tricompartment degenerative change. Lucency noted over the lateral tibial plateau on one view only is most likely secondary to overlying soft tissues. No definite evidence fracture of dislocation. IMPRESSION: Prominent tricompartment degenerative change. No acute bony abnormality. Electronically Signed   By: Maisie Fushomas  Register   On: 05/24/2016 10:30    Assessment & Plan:   [name] was seen today for hypertension, back pain and asthma.  Diagnoses and all orders for this visit:  Severe persistent chronic asthma without complication- he will restart use of the LABA/ICS combination to control his wheezing. -     mometasone-formoterol (DULERA) 200-5 MCG/ACT AERO; Inhale 2 puffs into the lungs 2 (two) times daily.  Essential hypertension, benign- his blood pressure is adequately well controlled.   I am having [name] maintain his albuterol, methocarbamol, chlorthalidone, montelukast, nebivolol, HYDROcodone-acetaminophen, and mometasone-formoterol.  Meds ordered this encounter    Medications  . mometasone-formoterol (DULERA) 200-5 MCG/ACT AERO    Sig: Inhale 2 puffs into the lungs 2 (two) times daily.    Dispense:  1 Inhaler    Refill:  11    Order Specific Question:   Lot Number?    Answer:   [id number]    Order Specific Question:   Expiration Date?    Answer:   01/03/2015    Order Specific Question:   Manufacturer?    Answer:   [employer] [[id number]]    Order Specific Question:   Quantity    Answer:   1    Comments:   1 sample at 8.8g/60 metered actuations     Follow-up: Return in about 6 months (around 03/16/2017).  Sanda Lingerhomas Aralynn Brake, MD

## 2016-09-15 NOTE — Progress Notes (Signed)
Pre visit review using our clinic review tool, if applicable. No additional management support is needed unless otherwise documented below in the visit note. 

## 2016-10-06 ENCOUNTER — Telehealth: Payer: Self-pay | Admitting: *Deleted

## 2016-10-06 ENCOUNTER — Other Ambulatory Visit: Payer: Self-pay | Admitting: Internal Medicine

## 2016-10-06 DIAGNOSIS — M5136 Other intervertebral disc degeneration, lumbar region: Secondary | ICD-10-CM

## 2016-10-06 MED ORDER — HYDROCODONE-ACETAMINOPHEN 5-325 MG PO TABS: 1.0000 | ORAL_TABLET | Freq: Two times a day (BID) | ORAL | 0 refills | Status: DC | PRN

## 2016-10-06 MED FILL — HYDROCODON-APAP 5-325: 5-325 | 30 days supply | Qty: 60 | Fill #0

## 2016-10-06 NOTE — Telephone Encounter (Signed)
Rec'd call pt requesting refill on his Hydrocodone.../lmb 

## 2016-10-06 NOTE — Telephone Encounter (Signed)
RX written 

## 2016-10-06 NOTE — Telephone Encounter (Signed)
Notified pt rx ready for pick-up.../lmb 

## 2016-11-05 ENCOUNTER — Other Ambulatory Visit: Payer: Self-pay | Admitting: Internal Medicine

## 2016-11-05 ENCOUNTER — Telehealth: Payer: Self-pay

## 2016-11-05 DIAGNOSIS — M5136 Other intervertebral disc degeneration, lumbar region: Secondary | ICD-10-CM

## 2016-11-05 MED ORDER — HYDROCODONE-ACETAMINOPHEN 5-325 MG PO TABS: 1.0000 | ORAL_TABLET | Freq: Two times a day (BID) | ORAL | 0 refills | Status: DC | PRN

## 2016-11-05 NOTE — Telephone Encounter (Signed)
Pt is rq rf for pain medication. Pt stated that he will be out on Monday but wanted to let Dr. Yetta BarreJones know. Pt stated he knows Dr. Yetta BarreJones is out of the office today.

## 2016-11-08 NOTE — Telephone Encounter (Signed)
Tried to call number x 4 but phone never rang.

## 2016-11-08 NOTE — Telephone Encounter (Signed)
rx written. Placed up front for pick up.

## 2016-11-12 MED FILL — HYDROCODON-APAP 5-325: 5-325 | 30 days supply | Qty: 60 | Fill #0

## 2016-11-16 DIAGNOSIS — Z0289 Encounter for other administrative examinations: Secondary | ICD-10-CM

## 2016-12-14 ENCOUNTER — Telehealth: Payer: Self-pay | Admitting: Internal Medicine

## 2016-12-14 NOTE — Telephone Encounter (Signed)
Pt came by office stating he needs a refill on his Hydrocodone.  Pt also dropped off a copy of the Workers' Compensation Medical Status [employer]/restrictions for review.  Pt will go back to Dr. Turner Danielsowan on 12/23/16.  Form placed in box for pick up by CMA.

## 2016-12-15 ENCOUNTER — Other Ambulatory Visit: Payer: Self-pay | Admitting: Internal Medicine

## 2016-12-15 DIAGNOSIS — M5136 Other intervertebral disc degeneration, lumbar region: Secondary | ICD-10-CM

## 2016-12-15 MED ORDER — HYDROCODONE-ACETAMINOPHEN 5-325 MG PO TABS: 1.0000 | ORAL_TABLET | Freq: Two times a day (BID) | ORAL | 0 refills | Status: DC | PRN

## 2016-12-15 MED FILL — HYDROCODON-APAP 5-325: 5-325 | 30 days supply | Qty: 60 | Fill #0

## 2016-12-15 NOTE — Telephone Encounter (Signed)
Do you have the form for patients Workers Comp?

## 2016-12-15 NOTE — Telephone Encounter (Signed)
RX written 

## 2016-12-15 NOTE — Telephone Encounter (Signed)
lvm that rx is up front for pick up.

## 2017-01-12 MED FILL — NAPROXEN 500 MG TABLET: 500 | 30 days supply | Qty: 60 | Fill #0

## 2017-01-12 MED FILL — HYDROCODON-APAP 5-325: 5-325 | 30 days supply | Qty: 60 | Fill #0

## 2017-02-07 ENCOUNTER — Other Ambulatory Visit: Payer: Self-pay | Admitting: Internal Medicine

## 2017-02-07 ENCOUNTER — Telehealth: Payer: Self-pay | Admitting: Internal Medicine

## 2017-02-07 DIAGNOSIS — M5136 Other intervertebral disc degeneration, lumbar region: Secondary | ICD-10-CM

## 2017-02-07 MED ORDER — HYDROCODONE-ACETAMINOPHEN 5-325 MG PO TABS: 1.0000 | ORAL_TABLET | Freq: Two times a day (BID) | ORAL | 0 refills | Status: DC | PRN

## 2017-02-07 NOTE — Telephone Encounter (Signed)
RX written 

## 2017-02-07 NOTE — Telephone Encounter (Signed)
Pt would like a refill of HYDROcodone-acetaminophen (NORCO/VICODIN) 5-325 MG tablet    [facility]

## 2017-02-08 NOTE — Telephone Encounter (Signed)
Notified pt rx ready for pick-up.../lmb 

## 2017-02-09 MED FILL — HYDROCODON-APAP 5-325: 5-325 | 30 days supply | Qty: 60 | Fill #0

## 2017-03-09 MED FILL — NAPROXEN 500 MG TABLET: 500 | 30 days supply | Qty: 60 | Fill #1

## 2017-03-15 ENCOUNTER — Telehealth: Payer: Self-pay | Admitting: *Deleted

## 2017-03-15 NOTE — Telephone Encounter (Signed)
Rec'd call pt requesting refill on his Hydrocodone.../lmb 

## 2017-03-15 NOTE — Telephone Encounter (Signed)
Called pt made appt for 03/21/17...[name]/lmb

## 2017-03-15 NOTE — Telephone Encounter (Signed)
I think he is due for a UDS

## 2017-03-21 ENCOUNTER — Ambulatory Visit (INDEPENDENT_AMBULATORY_CARE_PROVIDER_SITE_OTHER): Payer: 59 | Admitting: Internal Medicine

## 2017-03-21 ENCOUNTER — Encounter: Payer: Self-pay | Admitting: Internal Medicine

## 2017-03-21 VITALS — BP 140/90 | HR 72 | Temp 97.7°F | Resp 20 | Ht 69.5 in | Wt 146.8 lb

## 2017-03-21 DIAGNOSIS — M5136 Other intervertebral disc degeneration, lumbar region: Secondary | ICD-10-CM | POA: Diagnosis not present

## 2017-03-21 DIAGNOSIS — J4550 Severe persistent asthma, uncomplicated: Secondary | ICD-10-CM | POA: Diagnosis not present

## 2017-03-21 DIAGNOSIS — R062 Wheezing: Secondary | ICD-10-CM | POA: Diagnosis not present

## 2017-03-21 DIAGNOSIS — Z79891 Long term (current) use of opiate analgesic: Secondary | ICD-10-CM | POA: Diagnosis not present

## 2017-03-21 LAB — POCT EXHALED NITRIC OXIDE: FENO LEVEL (PPB): 19

## 2017-03-21 MED ORDER — METHYLPREDNISOLONE 4 MG PO TBPK: ORAL_TABLET | ORAL | 0 refills | Status: DC

## 2017-03-21 MED ORDER — ALBUTEROL SULFATE HFA 108 (90 BASE) MCG/ACT IN AERS: 2.0000 | INHALATION_SPRAY | RESPIRATORY_TRACT | 3 refills | Status: DC | PRN

## 2017-03-21 MED ORDER — MOMETASONE FURO-FORMOTEROL FUM 200-5 MCG/ACT IN AERO: 2.0000 | INHALATION_SPRAY | Freq: Two times a day (BID) | RESPIRATORY_TRACT | 3 refills | Status: DC

## 2017-03-21 MED ORDER — HYDROCODONE-ACETAMINOPHEN 5-325 MG PO TABS: 1.0000 | ORAL_TABLET | Freq: Two times a day (BID) | ORAL | 0 refills | Status: DC | PRN

## 2017-03-21 MED FILL — HYDROCODON-APAP 5-325: 5-325 | 30 days supply | Qty: 60 | Fill #0

## 2017-03-21 MED FILL — VENTOLIN HFA 90 MCG INHALER: 108 (90 BAS | 16 days supply | Qty: 18 | Fill #0

## 2017-03-21 MED FILL — METHYLPREDNISOLONE 4 MG TAB: 4 | 6 days supply | Qty: 21 | Fill #0

## 2017-03-21 NOTE — Patient Instructions (Signed)

## 2017-03-22 NOTE — Progress Notes (Signed)
Subjective:  Patient ID: [name], male    DOB: 1965/11/17  Age: 51 y.o. MRN: [id number]  CC: Cough; Asthma; and Back Pain   HPI [name] presents for a 3 day history of nonproductive cough and wheezing. He recently lost his Dulera inhaler and subsequently had a recurrence of symptoms. He has gotten some symptom relief with albuterol. He also complains of chronic, intermittent, nonradiating low back pain and wants a refill on Norco.  Outpatient Medications Prior to Visit  Medication Sig Dispense Refill  . chlorthalidone (HYGROTON) 25 MG tablet Take 1 tablet (25 mg total) by mouth daily. 90 tablet 1  . methocarbamol (ROBAXIN) 500 MG tablet   2  . montelukast (SINGULAIR) 10 MG tablet Take 1 tablet (10 mg total) by mouth at bedtime. 90 tablet 3  . nebivolol (BYSTOLIC) 5 MG tablet Take 1 tablet (5 mg total) by mouth daily. 30 tablet 3  . albuterol (PROAIR HFA) 108 (90 Base) MCG/ACT inhaler Inhale 2 puffs into the lungs every 4 (four) hours as needed for wheezing. 1 Inhaler 1  . HYDROcodone-acetaminophen (NORCO/VICODIN) 5-325 MG tablet Take 1 tablet by mouth 2 (two) times daily as needed for moderate pain. 60 tablet 0  . mometasone-formoterol (DULERA) 200-5 MCG/ACT AERO Inhale 2 puffs into the lungs 2 (two) times daily. 1 Inhaler 11   No facility-administered medications prior to visit.     ROS Review of Systems  Constitutional: Negative.  Negative for chills, diaphoresis, fatigue and unexpected weight change.  HENT: Negative.  Negative for sore throat and trouble swallowing.   Eyes: Negative.   Respiratory: Positive for cough, shortness of breath and wheezing. Negative for choking, chest tightness and stridor.   Cardiovascular: Negative.  Negative for chest pain, palpitations and leg swelling.  Gastrointestinal: Negative for abdominal pain, blood in stool, constipation, diarrhea, nausea and vomiting.  Endocrine: Negative.   Genitourinary: Negative.  Negative for difficulty  urinating.  Musculoskeletal: Positive for back pain. Negative for arthralgias, joint swelling and myalgias.  Skin: Negative.  Negative for color change, pallor and rash.  Neurological: Negative.  Negative for dizziness, seizures, weakness, numbness and headaches.  Hematological: Negative.  Negative for adenopathy. Does not bruise/bleed easily.  Psychiatric/Behavioral: Negative.     Objective:  BP 140/90 (BP Location: Left Arm, Patient Position: Sitting, Cuff Size: Normal)   Pulse 72   Temp 97.7 F (36.5 C) (Oral)   Resp 20   Ht 5' 9.5" (1.765 m)   Wt 146 lb 12 oz (66.6 kg)   SpO2 99%   BMI 21.36 kg/m   BP Readings from Last 3 Encounters:  03/21/17 140/90  09/15/16 118/80  08/17/16 (!) 150/110    Wt Readings from Last 3 Encounters:  03/21/17 146 lb 12 oz (66.6 kg)  09/15/16 149 lb 2 oz (67.6 kg)  08/17/16 151 lb (68.5 kg)    Physical Exam  Constitutional:  Non-toxic appearance. He does not have a sickly appearance. He does not appear ill. He appears distressed.  HENT:  Mouth/Throat: No oropharyngeal exudate.  Eyes: Conjunctivae are normal. Right eye exhibits no discharge. Left eye exhibits no discharge. No scleral icterus.  Neck: Normal range of motion. Neck supple. No JVD present. No thyromegaly present.  Cardiovascular: Normal rate, regular rhythm and intact distal pulses.  Exam reveals no gallop.   No murmur heard. Pulmonary/Chest: No accessory muscle usage. Tachypnea noted. He is in respiratory distress. He has no decreased breath sounds. He has wheezes in the right middle field and  the left middle field. He has rhonchi in the right middle field and the left middle field. He has no rales.  He has mild respiratory distress and audible wheezing. He has diffuse bilateral late inspiratory wheezes and rhonchi but good air movement.  Abdominal: Soft. Bowel sounds are normal. He exhibits no distension and no mass. There is no tenderness. There is no rebound and no guarding.    Lymphadenopathy:    He has no cervical adenopathy.  Neurological: He has normal strength. He displays no atrophy, no tremor and normal reflexes. No cranial nerve deficit or sensory deficit. He exhibits normal muscle tone. He displays a negative Romberg sign. He displays no seizure activity. Coordination and gait normal.  Neg SLR in BLE  Skin: He is not diaphoretic.  Vitals reviewed.   Lab Results  Component Value Date   WBC 4.6 12/31/2015   HGB 15.3 12/31/2015   HCT 44.3 12/31/2015   PLT 231.0 12/31/2015   GLUCOSE 90 12/31/2015   CHOL 188 12/31/2015   TRIG 158.0 (H) 12/31/2015   HDL 63.50 12/31/2015   LDLCALC 93 12/31/2015   ALT 17 12/31/2015   AST 19 12/31/2015   NA 137 12/31/2015   K 3.8 12/31/2015   CL 99 12/31/2015   CREATININE 0.99 12/31/2015   BUN 16 12/31/2015   CO2 33 (H) 12/31/2015   TSH 0.94 12/31/2015   PSA 1.03 12/31/2015    Dg Knee Complete 4 Views Right  Result Date: 05/24/2016 CLINICAL DATA:  Pain.  No known injury.  Initial evaluation EXAM: RIGHT KNEE - COMPLETE 4+ VIEW COMPARISON:  No recent prior . FINDINGS: No acute bony or joint abnormality identified. Prominent tricompartment degenerative change. Lucency noted over the lateral tibial plateau on one view only is most likely secondary to overlying soft tissues. No definite evidence fracture of dislocation. IMPRESSION: Prominent tricompartment degenerative change. No acute bony abnormality. Electronically Signed   By: Maisie Fus  Register   On: 05/24/2016 10:30    Assessment & Plan:   [name] was seen today for cough, asthma and back pain.  Diagnoses and all orders for this visit:  Wheezing- he has a mildly elevated FeNO score so I recommended that he take a course of systemic steroids. Will also restart the LABA/ICS inhaler. -     POCT EXHALED NITRIC OXIDE -     methylPREDNISolone (MEDROL DOSEPAK) 4 MG TBPK tablet; TAKE AS DIRECTED -     mometasone-formoterol (DULERA) 200-5 MCG/ACT AERO; Inhale 2 puffs  into the lungs 2 (two) times daily.  Severe persistent chronic asthma without complication- he is having a mild exacerbation, will restart the [facility] and of also recommended that he take a course of systemic steroids. -     albuterol (PROAIR HFA) 108 (90 Base) MCG/ACT inhaler; Inhale 2 puffs into the lungs every 4 (four) hours as needed for wheezing. -     methylPREDNISolone (MEDROL DOSEPAK) 4 MG TBPK tablet; TAKE AS DIRECTED -     mometasone-formoterol (DULERA) 200-5 MCG/ACT AERO; Inhale 2 puffs into the lungs 2 (two) times daily.  Degenerative disc disease, lumbar -     HYDROcodone-acetaminophen (NORCO/VICODIN) 5-325 MG tablet; Take 1 tablet by mouth 2 (two) times daily as needed for moderate pain.   I am having [name] start on methylPREDNISolone. I am also having him maintain his methocarbamol, chlorthalidone, montelukast, nebivolol, albuterol, HYDROcodone-acetaminophen, and mometasone-formoterol.  Meds ordered this encounter  Medications  . albuterol (PROAIR HFA) 108 (90 Base) MCG/ACT inhaler    Sig:  Inhale 2 puffs into the lungs every 4 (four) hours as needed for wheezing.    Dispense:  1 Inhaler    Refill:  3    Order Specific Question:   Lot Number?    Answer:   [id number]    Order Specific Question:   Expiration Date?    Answer:   08/04/2014    Order Specific Question:   Quantity    Answer:   1    Comments:   inhaler  . methylPREDNISolone (MEDROL DOSEPAK) 4 MG TBPK tablet    Sig: TAKE AS DIRECTED    Dispense:  21 tablet    Refill:  0  . HYDROcodone-acetaminophen (NORCO/VICODIN) 5-325 MG tablet    Sig: Take 1 tablet by mouth 2 (two) times daily as needed for moderate pain.    Dispense:  60 tablet    Refill:  0  . mometasone-formoterol (DULERA) 200-5 MCG/ACT AERO    Sig: Inhale 2 puffs into the lungs 2 (two) times daily.    Dispense:  3 Inhaler    Refill:  3    Order Specific Question:   Lot Number?    Answer:   [id number]    Order Specific Question:   Expiration Date?     Answer:   01/03/2015    Order Specific Question:   Manufacturer?    Answer:   [employer] [18]    Order Specific Question:   Quantity    Answer:   1    Comments:   1 sample at 8.8g/60 metered actuations     Follow-up: Return in about 3 weeks (around 04/11/2017).  Sanda Linger, MD

## 2017-03-23 ENCOUNTER — Encounter: Payer: Self-pay | Admitting: Internal Medicine

## 2017-04-07 DIAGNOSIS — Z0279 Encounter for issue of other medical certificate: Secondary | ICD-10-CM

## 2017-04-25 ENCOUNTER — Other Ambulatory Visit: Payer: Self-pay | Admitting: Internal Medicine

## 2017-04-25 ENCOUNTER — Telehealth: Payer: Self-pay | Admitting: Internal Medicine

## 2017-04-25 DIAGNOSIS — M5136 Other intervertebral disc degeneration, lumbar region: Secondary | ICD-10-CM

## 2017-04-25 MED ORDER — HYDROCODONE-ACETAMINOPHEN 5-325 MG PO TABS: 1.0000 | ORAL_TABLET | Freq: Two times a day (BID) | ORAL | 0 refills | Status: DC | PRN

## 2017-04-25 MED FILL — HYDROCODON-APAP 5-325: 5-325 | 30 days supply | Qty: 60 | Fill #0

## 2017-04-25 NOTE — Telephone Encounter (Signed)
Called pt no answer [facility] rx ready for pick-up.../lmb 

## 2017-04-25 NOTE — Telephone Encounter (Signed)
HYDROcodone-acetaminophen (NORCO/VICODIN) 5-325 MG   Patient is requesting a refill on this medication. Please advise. Thank you.

## 2017-04-25 NOTE — Telephone Encounter (Signed)
Check [place] registry last filled 03/21/2017 @ [facility], last UDS 03/21/17...Raechel Chute/lmb

## 2017-04-25 NOTE — Telephone Encounter (Signed)
RX ordered.

## 2017-05-11 ENCOUNTER — Ambulatory Visit: Payer: Self-pay | Admitting: Internal Medicine

## 2017-05-12 ENCOUNTER — Ambulatory Visit (INDEPENDENT_AMBULATORY_CARE_PROVIDER_SITE_OTHER): Payer: 59 | Admitting: Internal Medicine

## 2017-05-12 ENCOUNTER — Encounter: Payer: Self-pay | Admitting: Internal Medicine

## 2017-05-12 VITALS — BP 140/84 | HR 84 | Temp 98.2°F | Ht 69.5 in | Wt 153.5 lb

## 2017-05-12 DIAGNOSIS — I10 Essential (primary) hypertension: Secondary | ICD-10-CM

## 2017-05-12 DIAGNOSIS — J4550 Severe persistent asthma, uncomplicated: Secondary | ICD-10-CM | POA: Diagnosis not present

## 2017-05-12 DIAGNOSIS — R200 Anesthesia of skin: Secondary | ICD-10-CM | POA: Diagnosis not present

## 2017-05-12 DIAGNOSIS — R202 Paresthesia of skin: Secondary | ICD-10-CM

## 2017-05-12 MED ORDER — MONTELUKAST SODIUM 10 MG PO TABS: 10.0000 mg | ORAL_TABLET | Freq: Every day | ORAL | 3 refills | Status: DC

## 2017-05-12 MED FILL — MONTELUKAST SOD 10 MG TAB: 10 | 90 days supply | Qty: 90 | Fill #0

## 2017-05-12 NOTE — Patient Instructions (Signed)

## 2017-05-12 NOTE — Progress Notes (Signed)
Subjective:  Patient ID: [name], male    DOB: 1966/04/10  Age: 51 y.o. MRN: [id number]  CC: Hypertension and Asthma   HPI [name] presents for concerns about a 4 day history of numbness and tingling in his right hand. He has had this happen before and is concerned that it may be related to a prior right shoulder surgery. He has had some discomfort in his right shoulder but he denies neck pain. He says he is also waking up at night wheezing and needs a follow-up with his lung doctor. He is using his inhalers but doesn't think that he is taking Singulair.  Outpatient Medications Prior to Visit  Medication Sig Dispense Refill  . albuterol (PROAIR HFA) 108 (90 Base) MCG/ACT inhaler Inhale 2 puffs into the lungs every 4 (four) hours as needed for wheezing. 1 Inhaler 3  . chlorthalidone (HYGROTON) 25 MG tablet Take 1 tablet (25 mg total) by mouth daily. 90 tablet 1  . HYDROcodone-acetaminophen (NORCO/VICODIN) 5-325 MG tablet Take 1 tablet by mouth 2 (two) times daily as needed for moderate pain. 60 tablet 0  . methocarbamol (ROBAXIN) 500 MG tablet   2  . nebivolol (BYSTOLIC) 5 MG tablet Take 1 tablet (5 mg total) by mouth daily. 30 tablet 3  . montelukast (SINGULAIR) 10 MG tablet Take 1 tablet (10 mg total) by mouth at bedtime. 90 tablet 3  . mometasone-formoterol (DULERA) 200-5 MCG/ACT AERO Inhale 2 puffs into the lungs 2 (two) times daily. 3 Inhaler 3   No facility-administered medications prior to visit.     ROS Review of Systems  Constitutional: Negative.  Negative for appetite change, diaphoresis, fatigue, fever and unexpected weight change.  HENT: Negative.   Eyes: Negative for visual disturbance.  Respiratory: Positive for shortness of breath and wheezing. Negative for cough, choking, chest tightness and stridor.   Cardiovascular: Negative for chest pain, palpitations and leg swelling.  Gastrointestinal: Negative for abdominal pain, constipation, diarrhea, nausea and  vomiting.  Endocrine: Negative.   Genitourinary: Negative.  Negative for decreased urine volume, difficulty urinating, dysuria, hematuria and urgency.  Musculoskeletal: Positive for arthralgias.  Skin: Negative.  Negative for color change and rash.  Allergic/Immunologic: Negative.   Neurological: Positive for numbness. Negative for dizziness, syncope and weakness.  Hematological: Negative for adenopathy. Does not bruise/bleed easily.  Psychiatric/Behavioral: Negative.     Objective:  BP 140/84 (BP Location: Left Arm, Patient Position: Sitting, Cuff Size: Normal)   Pulse 84   Temp 98.2 F (36.8 C) (Oral)   Ht 5' 9.5" (1.765 m)   Wt 153 lb 8 oz (69.6 kg)   SpO2 98%   BMI 22.34 kg/m   BP Readings from Last 3 Encounters:  05/12/17 140/84  03/21/17 140/90  09/15/16 118/80    Wt Readings from Last 3 Encounters:  05/12/17 153 lb 8 oz (69.6 kg)  03/21/17 146 lb 12 oz (66.6 kg)  09/15/16 149 lb 2 oz (67.6 kg)    Physical Exam  Constitutional: He is oriented to person, place, and time. No distress.  HENT:  Mouth/Throat: Oropharynx is clear and moist. No oropharyngeal exudate.  Eyes: Conjunctivae are normal. Right eye exhibits no discharge. Left eye exhibits no discharge. No scleral icterus.  Neck: Normal range of motion. Neck supple. No JVD present. No thyromegaly present.  Cardiovascular: Normal rate, regular rhythm and intact distal pulses.  Exam reveals no gallop and no friction rub.   No murmur heard. Pulses:      Carotid pulses  are 1+ on the right side, and 1+ on the left side.      Radial pulses are 1+ on the right side, and 1+ on the left side.       Femoral pulses are 1+ on the right side, and 1+ on the left side.      Popliteal pulses are 1+ on the right side, and 1+ on the left side.       Dorsalis pedis pulses are 1+ on the right side, and 1+ on the left side.       Posterior tibial pulses are 1+ on the right side, and 1+ on the left side.  Pulmonary/Chest: Effort  normal and breath sounds normal. No respiratory distress. He has no wheezes. He has no rales. He exhibits no tenderness.  Abdominal: Soft. Bowel sounds are normal. He exhibits no distension and no mass. There is no tenderness. There is no rebound and no guarding.  Musculoskeletal: He exhibits no edema, tenderness or deformity.       Right hand: Normal. He exhibits normal range of motion, no tenderness, no bony tenderness, normal two-point discrimination, normal capillary refill, no deformity and no swelling. Normal sensation noted. Normal strength noted.  Lymphadenopathy:    He has no cervical adenopathy.  Neurological: He is alert and oriented to person, place, and time. He has normal strength. He displays no atrophy, no tremor and normal reflexes. No cranial nerve deficit or sensory deficit. He exhibits normal muscle tone. He displays no seizure activity. Coordination and gait normal. He displays no Babinski's sign on the right side. He displays no Babinski's sign on the left side.  Reflex Scores:      Tricep reflexes are 1+ on the right side and 1+ on the left side.      Bicep reflexes are 1+ on the right side and 1+ on the left side.      Brachioradialis reflexes are 1+ on the right side and 1+ on the left side.      Patellar reflexes are 1+ on the right side and 1+ on the left side.      Achilles reflexes are 1+ on the right side and 1+ on the left side. Skin: Skin is warm and dry. No rash noted. He is not diaphoretic. No erythema. No pallor.  Vitals reviewed.   Lab Results  Component Value Date   WBC 4.6 12/31/2015   HGB 15.3 12/31/2015   HCT 44.3 12/31/2015   PLT 231.0 12/31/2015   GLUCOSE 90 12/31/2015   CHOL 188 12/31/2015   TRIG 158.0 (H) 12/31/2015   HDL 63.50 12/31/2015   LDLCALC 93 12/31/2015   ALT 17 12/31/2015   AST 19 12/31/2015   NA 137 12/31/2015   K 3.8 12/31/2015   CL 99 12/31/2015   CREATININE 0.99 12/31/2015   BUN 16 12/31/2015   CO2 33 (H) 12/31/2015   TSH  0.94 12/31/2015   PSA 1.03 12/31/2015    Dg Knee Complete 4 Views Right  Result Date: 05/24/2016 CLINICAL DATA:  Pain.  No known injury.  Initial evaluation EXAM: RIGHT KNEE - COMPLETE 4+ VIEW COMPARISON:  No recent prior . FINDINGS: No acute bony or joint abnormality identified. Prominent tricompartment degenerative change. Lucency noted over the lateral tibial plateau on one view only is most likely secondary to overlying soft tissues. No definite evidence fracture of dislocation. IMPRESSION: Prominent tricompartment degenerative change. No acute bony abnormality. Electronically Signed   By: Maisie Fus  Register   On: 05/24/2016  10:30    Assessment & Plan:   Endy was seen today for hypertension and asthma.  Diagnoses and all orders for this visit:  Severe persistent chronic asthma without complication- I have asked him to continue the current inhalers and to restart Singulair. He'll also follow-up with his pulmonologist. -     Ambulatory referral to Pulmonology -     montelukast (SINGULAIR) 10 MG tablet; Take 1 tablet (10 mg total) by mouth at bedtime. -     CBC with Differential/Platelet; Future -     C-reactive protein; Future -     Sedimentation rate; Future  Essential hypertension, benign- his blood pressure is well controlled. Electrolytes and renal function are normal. -     Comprehensive metabolic panel; Future  Numbness and tingling in right hand- the neurovascular examination is normal. He does not have increased eosinophils on his CBC and his sedimentation rate and CRP are normal. This is reassuring that he doesn't have neuropathy or vasculitis related to Jake Shark syndrome as can be associated with severe asthma. I've asked him to undergo a NCS/EMG to try to identify the cause for his symptoms of numbness and tingling.  -     Ambulatory referral to Neurology -     CBC with Differential/Platelet; Future -     Vitamin B12; Future -     C-reactive protein; Future -      Sedimentation rate; Future   I am having Mr. Haverstock maintain his methocarbamol, chlorthalidone, nebivolol, albuterol, mometasone-formoterol, HYDROcodone-acetaminophen, and montelukast.  Meds ordered this encounter  Medications  . montelukast (SINGULAIR) 10 MG tablet    Sig: Take 1 tablet (10 mg total) by mouth at bedtime.    Dispense:  90 tablet    Refill:  3     Follow-up: Return in about 3 months (around 08/12/2017).  Sanda Linger, MD

## 2017-05-13 ENCOUNTER — Other Ambulatory Visit (INDEPENDENT_AMBULATORY_CARE_PROVIDER_SITE_OTHER): Payer: 59

## 2017-05-13 DIAGNOSIS — J4550 Severe persistent asthma, uncomplicated: Secondary | ICD-10-CM

## 2017-05-13 DIAGNOSIS — I10 Essential (primary) hypertension: Secondary | ICD-10-CM | POA: Diagnosis not present

## 2017-05-13 DIAGNOSIS — R200 Anesthesia of skin: Secondary | ICD-10-CM

## 2017-05-13 DIAGNOSIS — R202 Paresthesia of skin: Secondary | ICD-10-CM

## 2017-05-13 LAB — CBC WITH DIFFERENTIAL/PLATELET
BASOS PCT: 0.5 % (ref 0.0–3.0)
Basophils Absolute: 0 10*3/uL (ref 0.0–0.1)
Eosinophils Absolute: 0.2 10*3/uL (ref 0.0–0.7)
Eosinophils Relative: 4.8 % (ref 0.0–5.0)
HEMATOCRIT: 43.9 % (ref 39.0–52.0)
Hemoglobin: 15.5 g/dL (ref 13.0–17.0)
LYMPHS ABS: 1.6 10*3/uL (ref 0.7–4.0)
LYMPHS PCT: 36.9 % (ref 12.0–46.0)
MCHC: 35.3 g/dL (ref 30.0–36.0)
MCV: 82.6 fl (ref 78.0–100.0)
MONOS PCT: 12.1 % — AB (ref 3.0–12.0)
Monocytes Absolute: 0.5 10*3/uL (ref 0.1–1.0)
NEUTROS ABS: 2 10*3/uL (ref 1.4–7.7)
NEUTROS PCT: 45.7 % (ref 43.0–77.0)
PLATELETS: 195 10*3/uL (ref 150.0–400.0)
RBC: 5.31 Mil/uL (ref 4.22–5.81)
RDW: 13.8 % (ref 11.5–15.5)
WBC: 4.4 10*3/uL (ref 4.0–10.5)

## 2017-05-13 LAB — COMPREHENSIVE METABOLIC PANEL
ALT: 32 U/L (ref 0–53)
AST: 34 U/L (ref 0–37)
Albumin: 4.5 g/dL (ref 3.5–5.2)
Alkaline Phosphatase: 48 U/L (ref 39–117)
BILIRUBIN TOTAL: 0.6 mg/dL (ref 0.2–1.2)
BUN: 12 mg/dL (ref 6–23)
CALCIUM: 8.9 mg/dL (ref 8.4–10.5)
CHLORIDE: 104 meq/L (ref 96–112)
CO2: 30 meq/L (ref 19–32)
CREATININE: 1.47 mg/dL (ref 0.40–1.50)
GFR: 65.47 mL/min (ref >60.00)
GLUCOSE: 90 mg/dL (ref 70–99)
Potassium: 3.8 mEq/L (ref 3.5–5.1)
Sodium: 138 mEq/L (ref 135–145)
Total Protein: 7.9 g/dL (ref 6.0–8.3)

## 2017-05-13 LAB — C-REACTIVE PROTEIN: CRP: 0.1 mg/dL — ABNORMAL LOW (ref 0.5–20.0)

## 2017-05-13 LAB — SEDIMENTATION RATE: Sed Rate: 4 mm/hr (ref 0–15)

## 2017-05-13 LAB — VITAMIN B12: Vitamin B-12: 393 pg/mL (ref 211–911)

## 2017-05-14 ENCOUNTER — Encounter: Payer: Self-pay | Admitting: Internal Medicine

## 2017-05-23 ENCOUNTER — Encounter: Payer: Self-pay | Admitting: Neurology

## 2017-05-23 ENCOUNTER — Telehealth: Payer: Self-pay | Admitting: Internal Medicine

## 2017-05-23 NOTE — Telephone Encounter (Signed)
Pt was referred to neurology but the first available appointment that they have for him is not until September 17th. He said that he is still having tingling in his right hand as well as pain in his back and legs. He wanted to know if he could have a refill on his pain medication to get him through until his appointment. He has 5 pills left.

## 2017-05-25 ENCOUNTER — Other Ambulatory Visit: Payer: Self-pay | Admitting: Internal Medicine

## 2017-05-25 DIAGNOSIS — M5136 Other intervertebral disc degeneration, lumbar region: Secondary | ICD-10-CM

## 2017-05-25 MED ORDER — HYDROCODONE-ACETAMINOPHEN 5-325 MG PO TABS: 1.0000 | ORAL_TABLET | Freq: Two times a day (BID) | ORAL | 0 refills | Status: DC | PRN

## 2017-05-25 NOTE — Telephone Encounter (Signed)
Notified pt rx ready for pick-up.../lmb 

## 2017-05-25 NOTE — Telephone Encounter (Signed)
RX written 

## 2017-05-25 NOTE — Telephone Encounter (Signed)
Check [place] registry last filled 04/25/2017.../lmb  

## 2017-05-25 NOTE — Telephone Encounter (Signed)
Pt called for  A refill of his HYDROcodone-acetaminophen (NORCO/VICODIN) 5-325 MG tablet  Last OV 05/12/2017 Please advise

## 2017-05-26 MED FILL — HYDROCODON-APAP 5-325: 5-325 | 30 days supply | Qty: 60 | Fill #0

## 2017-06-02 ENCOUNTER — Telehealth: Payer: Self-pay | Admitting: Internal Medicine

## 2017-06-02 NOTE — Telephone Encounter (Signed)
Patient would like to know if Dr. Yetta BarreJones could complete another handicap parking placard form for him?  States he is still having back pain.  States he has an appointment with Neurologist on 9/17.

## 2017-06-08 ENCOUNTER — Encounter: Payer: Self-pay | Admitting: Internal Medicine

## 2017-06-08 ENCOUNTER — Ambulatory Visit (INDEPENDENT_AMBULATORY_CARE_PROVIDER_SITE_OTHER): Payer: 59 | Admitting: Internal Medicine

## 2017-06-08 VITALS — BP 138/90 | HR 90 | Temp 97.6°F | Resp 20 | Ht 69.5 in | Wt 152.0 lb

## 2017-06-08 DIAGNOSIS — I10 Essential (primary) hypertension: Secondary | ICD-10-CM

## 2017-06-08 DIAGNOSIS — Z79891 Long term (current) use of opiate analgesic: Secondary | ICD-10-CM | POA: Diagnosis not present

## 2017-06-08 DIAGNOSIS — J4550 Severe persistent asthma, uncomplicated: Secondary | ICD-10-CM | POA: Diagnosis not present

## 2017-06-08 MED ORDER — AZITHROMYCIN 500 MG PO TABS: 500.0000 mg | ORAL_TABLET | ORAL | 2 refills | Status: DC

## 2017-06-08 MED ORDER — METHYLPREDNISOLONE ACETATE 40 MG/ML IJ SUSP
40.0000 mg | Freq: Once | INTRAMUSCULAR | Status: AC
  Administered 2017-06-08: 40 mg via INTRAMUSCULAR

## 2017-06-08 MED ORDER — METHYLPREDNISOLONE ACETATE 80 MG/ML IJ SUSP
80.0000 mg | Freq: Once | INTRAMUSCULAR | Status: AC
  Administered 2017-06-08: 80 mg via INTRAMUSCULAR

## 2017-06-08 MED FILL — AZITHROMYCIN 500 MG TABLET: 500 | 30 days supply | Qty: 15 | Fill #0

## 2017-06-08 NOTE — Progress Notes (Signed)
Subjective:  Patient ID: [name], male    DOB: 07-Oct-1965  Age: 51 y.o. MRN: [id number]  CC: Hypertension and Asthma   HPI [name] presents for f/up with worsening wheezing over the last week. When I last saw him a few weeks ago for an asthma exacerbation I asked to see him as lung doctor. He tells me his lung doctor cannot see him anytime soon. He tells me [facility] but he doesn't think he is taking Singulair. He wasn't taking Singulair during the last visit either so I refilled it but it sounds like he has not started taking it. He is using his albuterol inhaler quite a bit.  Outpatient Medications Prior to Visit  Medication Sig Dispense Refill  . chlorthalidone (HYGROTON) 25 MG tablet Take 1 tablet (25 mg total) by mouth daily. 90 tablet 1  . HYDROcodone-acetaminophen (NORCO/VICODIN) 5-325 MG tablet Take 1 tablet by mouth 2 (two) times daily as needed for moderate pain. 60 tablet 0  . methocarbamol (ROBAXIN) 500 MG tablet   2  . montelukast (SINGULAIR) 10 MG tablet Take 1 tablet (10 mg total) by mouth at bedtime. 90 tablet 3  . nebivolol (BYSTOLIC) 5 MG tablet Take 1 tablet (5 mg total) by mouth daily. 30 tablet 3  . albuterol (PROAIR HFA) 108 (90 Base) MCG/ACT inhaler Inhale 2 puffs into the lungs every 4 (four) hours as needed for wheezing. 1 Inhaler 3  . mometasone-formoterol (DULERA) 200-5 MCG/ACT AERO Inhale 2 puffs into the lungs 2 (two) times daily. 3 Inhaler 3   No facility-administered medications prior to visit.     ROS Review of Systems  Constitutional: Negative.  Negative for chills, diaphoresis, fatigue and fever.  HENT: Negative.  Negative for facial swelling, sinus pressure, sore throat and trouble swallowing.   Eyes: Negative.   Respiratory: Positive for cough (NP cough), shortness of breath and wheezing. Negative for chest tightness.   Cardiovascular: Negative.  Negative for chest pain, palpitations and leg swelling.    Gastrointestinal: Negative for abdominal pain, constipation, diarrhea, nausea and vomiting.  Endocrine: Negative.   Genitourinary: Negative.  Negative for difficulty urinating and urgency.  Musculoskeletal: Positive for arthralgias. Negative for back pain and myalgias.  Skin: Negative.  Negative for pallor and rash.  Allergic/Immunologic: Negative.   Neurological: Negative.  Negative for dizziness and weakness.  Hematological: Negative for adenopathy. Does not bruise/bleed easily.  Psychiatric/Behavioral: Negative.     Objective:  BP 138/90 (BP Location: Left Arm, Patient Position: Sitting, Cuff Size: Normal)   Pulse 90   Temp 97.6 F (36.4 C) (Oral)   Resp 20   Ht 5' 9.5" (1.765 m)   Wt 152 lb (68.9 kg)   SpO2 99%   BMI 22.12 kg/m   BP Readings from Last 3 Encounters:  06/08/17 138/90  05/12/17 140/84  03/21/17 140/90    Wt Readings from Last 3 Encounters:  06/08/17 152 lb (68.9 kg)  05/12/17 153 lb 8 oz (69.6 kg)  03/21/17 146 lb 12 oz (66.6 kg)    Physical Exam  Constitutional: He is oriented to person, place, and time.  Non-toxic appearance. He does not have a sickly appearance. He does not appear ill. He appears distressed.  HENT:  Mouth/Throat: Oropharynx is clear and moist. No oropharyngeal exudate.  Eyes: Conjunctivae are normal. Right eye exhibits no discharge. Left eye exhibits no discharge. No scleral icterus.  Neck: Normal range of motion. Neck supple. No JVD present. No thyromegaly  present.  Cardiovascular: Normal rate, regular rhythm and intact distal pulses.  Exam reveals no gallop and no friction rub.   No murmur heard. Pulmonary/Chest: No accessory muscle usage. Tachypnea noted. He is in respiratory distress. He has no decreased breath sounds. He has wheezes in the right middle field and the left middle field. He has rhonchi in the right upper field, the right middle field, the right lower field, the left middle field and the left lower field. He has no  rales. He exhibits no tenderness.  He has diffuse, bilateral inspiratory rhonchi and exp wheezing. He has good air movement.  Abdominal: Soft. Bowel sounds are normal. He exhibits no distension and no mass. There is no tenderness. There is no rebound and no guarding.  Musculoskeletal: Normal range of motion. He exhibits no edema, tenderness or deformity.  Lymphadenopathy:    He has no cervical adenopathy.  Neurological: He is alert and oriented to person, place, and time.  Skin: Skin is warm and dry. No rash noted. He is not diaphoretic. No erythema. No pallor.  Psychiatric: He has a normal mood and affect. His behavior is normal. Judgment and thought content normal.  Vitals reviewed.   Lab Results  Component Value Date   WBC 4.4 05/13/2017   HGB 15.5 05/13/2017   HCT 43.9 05/13/2017   PLT 195.0 05/13/2017   GLUCOSE 90 05/13/2017   CHOL 188 12/31/2015   TRIG 158.0 (H) 12/31/2015   HDL 63.50 12/31/2015   LDLCALC 93 12/31/2015   ALT 32 05/13/2017   AST 34 05/13/2017   NA 138 05/13/2017   K 3.8 05/13/2017   CL 104 05/13/2017   CREATININE 1.47 05/13/2017   BUN 12 05/13/2017   CO2 30 05/13/2017   TSH 0.94 12/31/2015   PSA 1.03 12/31/2015    Dg Knee Complete 4 Views Right  Result Date: 05/24/2016 CLINICAL DATA:  Pain.  No known injury.  Initial evaluation EXAM: RIGHT KNEE - COMPLETE 4+ VIEW COMPARISON:  No recent prior . FINDINGS: No acute bony or joint abnormality identified. Prominent tricompartment degenerative change. Lucency noted over the lateral tibial plateau on one view only is most likely secondary to overlying soft tissues. No definite evidence fracture of dislocation. IMPRESSION: Prominent tricompartment degenerative change. No acute bony abnormality. Electronically Signed   By: Maisie Fus  Register   On: 05/24/2016 10:30    Assessment & Plan:   [name] was seen today for hypertension and asthma.  Diagnoses and all orders for this visit:  Essential hypertension, benign-  his blood pressure is adequately well-controlled.  Severe persistent chronic asthma without complication- he is having another exacerbation. I encouraged him to continue compliance with [facility] and to start the Singulair as directed. I gave him another course of systemic steroids today. Also, since he is having such frequent exacerbations I asked him to take Zithromax 500 mg every other day for the next few months as this has been shown to have an anti-inflammatory property and to reduce asthma symptoms and exacerbations. Also, since his lung doctor can't see him I have asked him to see an allergist. -     azithromycin (ZITHROMAX) 500 MG tablet; Take 1 tablet (500 mg total) by mouth every other day. -     Ambulatory referral to Allergy -     methylPREDNISolone acetate (DEPO-MEDROL) injection 40 mg; Inject 1 mL (40 mg total) into the muscle once. -     methylPREDNISolone acetate (DEPO-MEDROL) injection 80 mg; Inject 1 mL (80 mg total)  into the muscle once. -     albuterol (PROAIR HFA) 108 (90 Base) MCG/ACT inhaler; Inhale 2 puffs into the lungs every 4 (four) hours as needed for wheezing.   I am having [name] start on azithromycin. I am also having him maintain his methocarbamol, chlorthalidone, nebivolol, mometasone-formoterol, montelukast, HYDROcodone-acetaminophen, and albuterol. We administered methylPREDNISolone acetate and methylPREDNISolone acetate.  Meds ordered this encounter  Medications  . azithromycin (ZITHROMAX) 500 MG tablet    Sig: Take 1 tablet (500 mg total) by mouth every other day.    Dispense:  15 tablet    Refill:  2  . methylPREDNISolone acetate (DEPO-MEDROL) injection 40 mg  . methylPREDNISolone acetate (DEPO-MEDROL) injection 80 mg  . albuterol (PROAIR HFA) 108 (90 Base) MCG/ACT inhaler    Sig: Inhale 2 puffs into the lungs every 4 (four) hours as needed for wheezing.    Dispense:  1 Inhaler    Refill:  11    Order Specific Question:   Lot Number?    Answer:    [id number]    Order Specific Question:   Expiration Date?    Answer:   08/04/2014    Order Specific Question:   Quantity    Answer:   1    Comments:   inhaler     Follow-up: Return in about 1 week (around 06/15/2017).  Sanda Linger, MD

## 2017-06-08 NOTE — Patient Instructions (Signed)

## 2017-06-09 MED ORDER — ALBUTEROL SULFATE HFA 108 (90 BASE) MCG/ACT IN AERS: 2.0000 | INHALATION_SPRAY | RESPIRATORY_TRACT | 11 refills | Status: DC | PRN

## 2017-06-09 MED FILL — VENTOLIN HFA 90 MCG INHALER: 108 (90 BAS | 16 days supply | Qty: 18 | Fill #0

## 2017-06-16 ENCOUNTER — Other Ambulatory Visit: Payer: Self-pay | Admitting: Internal Medicine

## 2017-06-20 ENCOUNTER — Ambulatory Visit: Payer: Self-pay | Admitting: Internal Medicine

## 2017-06-20 ENCOUNTER — Other Ambulatory Visit: Payer: Self-pay | Admitting: Internal Medicine

## 2017-06-20 ENCOUNTER — Encounter: Payer: Self-pay | Admitting: Internal Medicine

## 2017-06-20 ENCOUNTER — Other Ambulatory Visit: Payer: Self-pay | Admitting: *Deleted

## 2017-06-20 ENCOUNTER — Encounter: Payer: Self-pay | Admitting: Neurology

## 2017-06-20 ENCOUNTER — Ambulatory Visit (INDEPENDENT_AMBULATORY_CARE_PROVIDER_SITE_OTHER): Payer: 59 | Admitting: Neurology

## 2017-06-20 DIAGNOSIS — R202 Paresthesia of skin: Secondary | ICD-10-CM

## 2017-06-20 DIAGNOSIS — R200 Anesthesia of skin: Secondary | ICD-10-CM

## 2017-06-20 DIAGNOSIS — G5601 Carpal tunnel syndrome, right upper limb: Secondary | ICD-10-CM

## 2017-06-20 NOTE — Procedures (Signed)
[facility]  [address] [place], [address]  [place], [place] [address] Tel: [phone] Fax:  [phone] Test Date:  06/20/2017  Patient: [name] DOB: 19-Jan-1966 Physician: Nita Sickle, DO  Sex: Male Height:  Ref Phys: Sanda Linger, M.D.  ID#: [id number] Temp: 37.1C Technician:    Patient Complaints: This is a 51 year old man referred for evaluation of right hand paresthesias and shoulder pain.  NCV & EMG Findings: Extensive electrodiagnostic testing of the right upper extremity shows:  1. Right median, ulnar, radial, medial, and lateral antebrachial cutaneous sensory responses are within normal limits. Right mixed palmar sensory responses are mildly prolonged. 2. Right median and ulnar motor responses are within normal limits. 3. There is no evidence of active or chronic motor axon loss changes affecting any of the tested muscles. Motor unit configuration and recruitment pattern is within normal limits.   Impression: 1. Right median neuropathy at or distal to the wrist, consistent with the clinical diagnosis of carpal tunnel syndrome. Overall, these findings are very mild in degree electrically. 2. There is no evidence of a cervical radiculopathy or brachial plexopathy affecting the right upper extremity.   ___________________________ Nita Sickle, DO    Nerve Conduction Studies Anti Sensory Summary Table   Site NR Peak (ms) Norm Peak (ms) P-T Amp (V) Norm P-T Amp  Right Lat Ante Brach Cutan Anti Sensory (Lat Forearm)  37.1C  Lat Biceps    2.6 <2.9 31.5 >14  Right Med Ante Brach Cutan Anti Sensory (Med Forearm)  37.1C  Elbow    2.5  21.6   Right Median Anti Sensory (2nd Digit)  37.1C  Wrist    3.1 <3.4 27.5 >20  Right Radial Anti Sensory (Base 1st Digit)  37.1C  Wrist    2.1 <2.7 40.0 >18  Right Ulnar Anti Sensory (5th Digit)  37.1C  Wrist    2.6 <3.1 29.6 >12   Motor Summary Table   Site NR Onset (ms) Norm Onset (ms) O-P Amp (mV) Norm O-P  Amp Site1 Site2 Delta-0 (ms) Dist (cm) Vel (m/s) Norm Vel (m/s)  Right Median Motor (Abd Poll Brev)  37.1C  Wrist    3.0 <3.9 13.2 >6 Elbow Wrist 5.1 31.0 61 >50  Elbow    8.1  12.6         Right Ulnar Motor (Abd Dig Minimi)  37.1C  Wrist    2.2 <3.1 10.8 >7 B Elbow Wrist 4.1 25.0 61 >50  B Elbow    6.3  10.6  A Elbow B Elbow 1.8 10.0 56 >50  A Elbow    8.1  9.6          Comparison Summary Table   Site NR Peak (ms) Norm Peak (ms) P-T Amp (V) Site1 Site2 Delta-P (ms) Norm Delta (ms)  Right Median/Ulnar Palm Comparison (Wrist - 8cm)  37.1C  Median Palm    1.8 <2.2 49.3 Median Palm Ulnar Palm 0.4   Ulnar Palm    1.4 <2.2 14.0       EMG   Side Muscle Ins Act Fibs Psw Fasc Number Recrt Dur Dur. Amp Amp. Poly Poly. Comment  Right 1stDorInt Nml Nml Nml Nml Nml Nml Nml Nml Nml Nml Nml Nml N/A  Right Ext Indicis Nml Nml Nml Nml Nml Nml Nml Nml Nml Nml Nml Nml N/A  Right Abd Poll Brev Nml Nml Nml Nml Nml Nml Nml Nml Nml Nml Nml Nml N/A  Right PronatorTeres Nml Nml Nml Nml Nml Nml  Nml Nml Nml Nml Nml Nml N/A  Right Biceps Nml Nml Nml Nml Nml Nml Nml Nml Nml Nml Nml Nml N/A  Right Triceps Nml Nml Nml Nml Nml Nml Nml Nml Nml Nml Nml Nml N/A  Right Deltoid Nml Nml Nml Nml Nml Nml Nml Nml Nml Nml Nml Nml N/A  Right Cervical Parasp Low Nml Nml Nml Nml NE - - - - - - - N/A      Waveforms:

## 2017-06-23 ENCOUNTER — Telehealth: Payer: Self-pay | Admitting: Internal Medicine

## 2017-06-23 NOTE — Telephone Encounter (Signed)
NO His last UDS was negative He is not taking it

## 2017-06-23 NOTE — Telephone Encounter (Signed)
Patient called in and was notified.

## 2017-06-23 NOTE — Telephone Encounter (Signed)
Patient requesting script for hydrocodone.   °

## 2017-06-24 ENCOUNTER — Telehealth: Payer: Self-pay

## 2017-06-24 NOTE — Telephone Encounter (Signed)
Called pt no answer LMOM w/MD response../lmb 

## 2017-06-24 NOTE — Telephone Encounter (Signed)
UDS came back as negative for Hydrocodone. Per PCP note - "UDS is negative for hyrdrocodone; Norco. rx has been discontinued".   Letter sent to patient address on file stating same.

## 2017-07-18 ENCOUNTER — Ambulatory Visit: Payer: 59 | Admitting: Allergy and Immunology

## 2017-07-26 ENCOUNTER — Encounter: Payer: Self-pay | Admitting: Occupational Therapy

## 2017-07-26 ENCOUNTER — Ambulatory Visit: Payer: 59 | Attending: Internal Medicine | Admitting: Occupational Therapy

## 2017-07-26 DIAGNOSIS — M79641 Pain in right hand: Secondary | ICD-10-CM | POA: Diagnosis not present

## 2017-07-26 DIAGNOSIS — M6281 Muscle weakness (generalized): Secondary | ICD-10-CM | POA: Diagnosis not present

## 2017-07-26 DIAGNOSIS — R29818 Other symptoms and signs involving the nervous system: Secondary | ICD-10-CM | POA: Insufficient documentation

## 2017-07-26 NOTE — Therapy (Signed)
[facility] [address] [place], [place], [address] Phone: [phone]   Fax:  [phone]  Occupational Therapy Evaluation  Patient Details  Name: Ryan Rudyrone Morris MRN: [id number] Date of Birth: 1966/01/27 Referring Provider: Dr. Sanda Lingerhomas Jones  Encounter Date: 07/26/2017      OT End of Session - 07/26/17 0901    Visit Number 1   Number of Visits 16  pt will likely not need full 8 weeks   Date for OT Re-Evaluation 09/20/17   Authorization Type UMR no visit limits no auth   OT Start Time 0802   OT Stop Time 0845   OT Time Calculation (min) 43 min   Activity Tolerance Patient tolerated treatment well      Past Medical History:  Diagnosis Date  . Asthma   . Sarcoidosis   . Sarcoidosis   . Sickle cell anemia (HCC)   . Sickle cell trait [facility](HCC)     Past Surgical History:  Procedure Laterality Date  . I&D EXTREMITY  01/19/2012   Procedure: IRRIGATION AND DEBRIDEMENT EXTREMITY;  Surgeon: Nadara MustardMarcus V Duda, MD;  Location: [facility] OR;  Service: Orthopedics;  Laterality: Right;  . SHOULDER OPEN ROTATOR CUFF REPAIR  2007    There were no vitals filed for this visit.      Subjective Assessment - 07/26/17 0801    Subjective  I just want this to get better I use my hand at work alot.   Pertinent History R carpal tunnel syndrome.   Patient Stated Goals get rid of the pain and be able to use my hand normally   Currently in Pain? Yes   Pain Score 7    Pain Location Hand   Pain Orientation Right   Pain Descriptors / Indicators Tingling;Pins and needles;Numbness   Pain Type Acute pain   Pain Onset Other (comment)  at least 5-6 months   Aggravating Factors  worse first thing in the morning, working aggravates it   Pain Relieving Factors rubbing it for awhile but starts right back up once I stop           [facility] - 07/26/17 0001      Assessment   Diagnosis R carpal tunnel syndrome   Referring Provider Dr.  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, supplies, biohazard   Leisure watch movie, yard work and work around my house     ADL   Eating/Feeding Independent   Grooming Independent   Education officer, environmentalUpper Body Bathing Independent   Lower Body Bathing Independent   Upper Body Dressing Increased time  buttons, zippers   Lower Body Dressing Increased time   OceanographerToilet Transfer Independent   Toileting - Conservator, museum/galleryClothing Manipulation Independent   Toileting -  Tour managerHygiene Independent   Tub/Shower Transfer Independent     IADL   Shopping Takes care of all shopping needs independently   Light Housekeeping Maintains house alone or with occasional assistance   Meal Prep Plans, prepares and serves adequate meals independently   Data processing managerCommunity Mobility Drives own vehicle   Medication Management Is responsible for taking  medication in correct dosages at correct time     Mobility   Mobility Status Independent     Written Expression   Dominant Hand Right   Handwriting 100% legible  increases symptoms to write     Sensation   Light Touch Appears Intact  hypersensitive in R hand   Hot/Cold Appears Intact     Coordination   Gross Motor Movements are Fluid and Coordinated Yes   Fine Motor Movements are Fluid and Coordinated Yes   9 Hole Peg Test Right     Edema   Edema none at present at time;  pt states he does not think he has had any swelling     ROM / Strength   AROM / PROM / Strength AROM;Strength     AROM   Overall AROM  Within functional limits for tasks performed     Hand Function   Right Hand Gross Grasp Impaired   Right Hand Grip (lbs) 70  limited by pain   Left Hand Gross Grasp  Functional   Left Hand Grip (lbs) 85                         OT Education - 07/26/17 0852    Education provided Yes   Education Details HEP for glides, splint wear and care   Person(s) Educated Patient   Methods Explanation;Demonstration;Verbal cues;Handout   Comprehension Verbalized understanding;Returned demonstration          OT Short Term Goals - 07/26/17 1610      OT SHORT TERM GOAL #1   Title Pt will be mod I with HEP - 08/23/2017   Status New     OT SHORT TERM GOAL #2   Title Pt will rate pain no greater than 4/10 with functional use of R hand   Status New     OT SHORT TERM GOAL #3   Title Pt will demonstrate improved grip strength by at least 5 pounds to assist with functional tasks (baseline = 70)   Status New     OT SHORT TERM GOAL #4   Title Pt will report greater ease in buttoning, zipping   Status New     OT SHORT TERM GOAL #5   Title Pt will be  mod I with splint wear and care.   Status New           OT Long Term Goals - 07/26/17 0854      OT LONG TERM GOAL #1   Title Pt will be mod I with upgraded HEP - 09/20/2017 (pt may not need all 8 weeks to reach goals)   Status New     OT LONG TERM GOAL #2   Title Pt will rate pain no greater than 2/10 in R hand with functional activities.    Status New     OT LONG TERM GOAL #3   Title Pt will demonstrate improved grip strength by at least 8 pounds to assist with functional tasks (baseline= 70)   Status New     OT LONG TERM GOAL #4   Title Pt will demonstrate understanding of task adaptation for work and leisure activities.   Status New               Plan - 07/26/17 0856    Clinical Impression Statement Pt is a 51 year old male who presents today with diagnosis of R carpal tunnel syndrome (conservative mgmt).  Pt presents with  the following impairments that impact his ability to use his R hand for ADL's, IADL's, sleep, work, and leisure: pain in R hand, parasthesias in R  hand, decreased grip strength, decreased functional use of R hand.  Pt will benefit from skilled OT to address these deficits and maximize painfree functional use of RUE.    Occupational Profile and client history currently impacting functional performance asthma, sickle cell anemia, HTN,    Occupational performance deficits (Please refer to evaluation for details): ADL's;IADL's;Rest and Sleep;Work;Leisure   Rehab Potential Good   OT Frequency 2x / week   OT Duration 8 weeks  pt likely will not need full 8 weeks   OT Treatment/Interventions Self-care/ADL training;Cryotherapy;Moist Heat;Contrast Bath;Fluidtherapy;Therapeutic exercise;DME and/or AE instruction;Passive range of motion;Manual Therapy;Splinting;Therapeutic activities;Patient/family education   Plan review goals, fluido,check and review HEP, add next exercises per protocol.    Consulted and Agree with Plan of Care Patient      Patient will benefit from skilled therapeutic intervention in order to improve the following deficits and impairments:  Decreased strength, Impaired UE functional use, Impaired sensation, Pain  Visit Diagnosis: Pain in right hand - Plan: Ot plan of care cert/re-cert  Other symptoms and signs involving the nervous system - Plan: Ot plan of care cert/re-cert  Muscle weakness (generalized) - Plan: Ot plan of care cert/re-cert    Problem List Patient Active Problem List   Diagnosis Date Noted  . Essential hypertension, benign 05/24/2016  . Left lumbar radiculitis 02/04/2016  . Routine general medical examination at a health care facility 12/31/2015  . Degenerative disc disease, lumbar 12/31/2015  . Carpal tunnel syndrome of right wrist 10/02/2011  . Alcohol abuse 01/20/2009  . ALLERGIC RHINITIS 01/20/2009  . Severe persistent chronic asthma without complication 01/02/2009  . Sarcoidosis (HCC) 11/10/2007    Norton Pastel, OTR/L 07/26/2017, 9:11 AM  [facility] [address] [place], [place], [address] Phone: [phone]   Fax:  [phone]  Name: [name] MRN: [id number] Date of Birth: Sep 10, 1966

## 2017-07-26 NOTE — Patient Instructions (Signed)
Pt instructed in splint wear and care, initially nerve glides and tendon glides, use of heat for pain prior to exercise.

## 2017-08-01 ENCOUNTER — Ambulatory Visit: Payer: 59 | Admitting: Occupational Therapy

## 2017-08-03 ENCOUNTER — Ambulatory Visit: Payer: 59 | Admitting: Occupational Therapy

## 2017-08-11 ENCOUNTER — Ambulatory Visit: Payer: 59 | Admitting: Occupational Therapy

## 2017-08-12 ENCOUNTER — Ambulatory Visit (INDEPENDENT_AMBULATORY_CARE_PROVIDER_SITE_OTHER)
Admission: RE | Admit: 2017-08-12 | Discharge: 2017-08-12 | Disposition: A | Discharge status: Home / Self Care | Payer: 59 | Source: Ambulatory Visit | Attending: Internal Medicine | Admitting: Internal Medicine

## 2017-08-12 ENCOUNTER — Ambulatory Visit (INDEPENDENT_AMBULATORY_CARE_PROVIDER_SITE_OTHER): Payer: 59 | Admitting: Internal Medicine

## 2017-08-12 ENCOUNTER — Encounter: Payer: Self-pay | Admitting: Internal Medicine

## 2017-08-12 ENCOUNTER — Encounter: Payer: Self-pay | Admitting: *Deleted

## 2017-08-12 ENCOUNTER — Ambulatory Visit: Payer: 59 | Admitting: Occupational Therapy

## 2017-08-12 VITALS — BP 132/84 | HR 109 | Ht 71.0 in | Wt 151.6 lb

## 2017-08-12 DIAGNOSIS — J4550 Severe persistent asthma, uncomplicated: Secondary | ICD-10-CM | POA: Diagnosis not present

## 2017-08-12 DIAGNOSIS — D869 Sarcoidosis, unspecified: Secondary | ICD-10-CM

## 2017-08-12 DIAGNOSIS — R0600 Dyspnea, unspecified: Secondary | ICD-10-CM | POA: Diagnosis not present

## 2017-08-12 MED ORDER — PREDNISONE 10 MG PO TABS: ORAL_TABLET | ORAL | 0 refills | Status: DC

## 2017-08-12 MED ORDER — BUDESONIDE-FORMOTEROL FUMARATE 160-4.5 MCG/ACT IN AERO: INHALATION_SPRAY | RESPIRATORY_TRACT | 11 refills | Status: DC

## 2017-08-12 MED ORDER — BUDESONIDE-FORMOTEROL FUMARATE 160-4.5 MCG/ACT IN AERO: 2.0000 | INHALATION_SPRAY | Freq: Two times a day (BID) | RESPIRATORY_TRACT | 0 refills | Status: DC

## 2017-08-12 NOTE — Patient Instructions (Addendum)
Plan A = Automatic = symbicort 160 Take 2 puffs first thing in am and then another 2 puffs about 12 hours later.    Work on Musicianperfecting  inhaler technique:  relax and gently blow all the way out then take a nice smooth deep breath back in, triggering the inhaler at same time you start breathing in.  Hold for up to 5 seconds if you can. Blow out thru nose. Rinse and gargle with water when done      Plan B = Backup Only use your albuterol as a rescue medication to be used if you can't catch your breath by resting or doing a relaxed purse lip breathing pattern.  - The less you use it, the better it will work when you need it. - Ok to use the inhaler up to 2 puffs  every 4 hours if you must but call for appointment if use goes up over your usual need - Don't leave home without it !!  (think of it like the spare tire for your car)    Prednisone 10 mg take  4 each am x 2 days,   2 each am x 2 days,  1 each am x 2 days and stop      Please schedule a follow up office visit in 6 weeks, call sooner if needed with pfts

## 2017-08-12 NOTE — Progress Notes (Signed)
Subjective:    Patient ID: [name], male    DOB: 16-Jan-1966     MRN: [id number]    Brief patient profile:  [age] yobm former smoker quit around 1995,  dx with sarcoid 05/2004  by tbbx on prednisone off and on since then with inconsistent adherence documented.     History of Present Illness  10/01/2011 ov/Nino Amano off prednisone since Oct 2012 again ran out of both inhlaers before ov> worse sob since, minimal cough.   Main cc is indolent onset x sev weeks  of  worsening R shoulder pain radiating down to his forearm assoc with tingling and numbness in all 5 fingers. No neck pain, no fever. Has f/u with his othopedic doctor already scheduled.  Denies trauma. rec Remember the dulera Take 2 puffs first thing in am and then another 2 puffs about 12 hours later.  Prednisone 10 mg take  4 each am x 2 days,   2 each am x 2 days,  1 each am x2days and stop  Only use your xopenex sample as a rescue medication     05/08/2012 f/u ov/Truitt Cruey cc missed appt  05/19/2012 f/u ov/Keahi Mccarney cc missed appt    05/18/2016  f/u ov/Adalaya Irion re: dtca asthma/ documented non-adherence  Chief Complaint  Patient presents with  . Acute Visit    Pt c/o increased SOB, back pain, PND, losing voice X1 month.    admits not using dulera regularly, has "new rx" in hand = sample size only Onset of worse sob / chest tightness x one month indolent/ progressive with wheezing some better with saba which overuses at baseline and did not call as instructed. rec Prednisone 10 mg take  4 each am x 2 days,   2 each am x 2 days,  1 each am x 2 days and stop  Plan A = Automatic =  dulera 200 Take 2 puffs first thing in am and then another 2 puffs about 12 hours later.  Take it when you do your dental care Work on inhaler technique:   Plan B = Backup Only use your albuterol as a rescue medication     08/12/2017  f/u ov/Treena Cosman re:  dtca asthma  Chief Complaint  Patient presents with  . Acute Visit    Increased SOB x 3 months. He gets winded  while at work and then also wakes up in the night feeling SOB and having night sweats.  He is using his albuterol 3-4 x per day.   Using saba nightly x one month attributes to wife turing heat up too high     Brought dulera on zero - costs 50 per m/ note over use of saba with increased doe x 3 months    No obvious day to day or daytime variability or assoc excess/ purulent sputum or mucus plugs or hemoptysis or cp or chest tightness, subjective wheeze or overt sinus or hb symptoms. No unusual exp hx or h/o childhood pna/ asthma or knowledge of premature birth.   Also denies any obvious fluctuation of symptoms with weather or environmental changes or other aggravating or alleviating factors except as outlined above   Current Allergies, Complete Past Medical History, Past Surgical History, Family History, and Social History were reviewed in [facility] electronic medical record.  ROS  The following are not active complaints unless bolded Hoarseness, sore throat, dysphagia, dental problems, itching, sneezing,  nasal congestion or discharge of excess mucus or purulent secretions, ear ache,   fever, chills,  sweats, unintended wt loss or wt gain, classically pleuritic or exertional cp,  orthopnea pnd or leg swelling, presyncope, palpitations, abdominal pain, anorexia, nausea, vomiting, diarrhea  or change in bowel habits or change in bladder habits, change in stools or change in urine, dysuria, hematuria,  rash, arthralgias, visual complaints, headache, numbness, weakness or ataxia or problems with walking or coordination,  change in mood/affect or memory.        Current Meds  Medication Sig  . albuterol (PROAIR HFA) 108 (90 Base) MCG/ACT inhaler Inhale 2 puffs into the lungs every 4 (four) hours as needed for wheezing.  . [DISCONTINUED] azithromycin (ZITHROMAX) 500 MG tablet Take 1 tablet (500 mg total) by mouth every other day.  . [  mometasone-formoterol (DULERA) 200-5 MCG/ACT AERO Inhale 2  puffs into the lungs 2 (two) times daily.           Past Medical History:  SARCOIDOSIS (ICD-135).........................................................Marland Kitchen.Calley Drenning  - FOB with lung biopsy 2005 positive NCG   Sickle Cell Trait  Asthma      Family History:  Pos asthma, mother  Mother - Sickle Cell Disease, Asthma, CAD s/p MI (prior to 49 yo), s/p CVA (frequent hospitalizations)  Father - passed in 782005 61(62 yo) from Prostate Cancer  4 siblings  Sister - multiple medical problems - details unk  Brother - ? Cancer  Brother - died 2/2 drug abuse              Objective:   Physical Exam  Ambulatory healthy bm  / vital signs reviewed  - Note on arrival 02 sats  96% on RA     wt   143 Feb 08, 2009 > 141 March 04, 2010  >   01/26/2012  158 > 04/07/2012  151>  158 02/09/2013 > 03/10/13 154 > 06/28/2013  153 > 12/16/2015  147 > 05/19/2016 145> 08/12/2017   151    HEENT: nl dentition, turbinates bilaterally, and oropharynx. Nl external ear canals without cough reflex   NECK :  without JVD/Nodes/TM/ nl carotid upstrokes bilaterally   LUNGS: no acc muscle use,  Nl contour chest with distant bilateral pan exp wheeze and cough in late exp   CV:  RRR  no s3 or murmur or increase in P2, and no edema   ABD:  soft and nontender with nl inspiratory excursion in the supine position. No bruits or organomegaly appreciated, bowel sounds nl  MS:  Nl gait/ ext warm without deformities, calf tenderness, cyanosis or clubbing No obvious joint restrictions   SKIN: warm and dry without lesions    NEURO:  alert, approp, nl sensorium with  no motor or cerebellar deficits apparent.        CXR PA and Lateral:   08/12/2017 :    I personally reviewed images and agree with radiology impression as follows:    Prominence of the hila bilaterally consistent with lymphadenopathy is unchanged. Lungs are clear. Heart size is normal. No pneumothorax or pleural effusion. No acute bony abnormality.     Assessment &  Plan:

## 2017-08-13 ENCOUNTER — Encounter: Payer: Self-pay | Admitting: Internal Medicine

## 2017-08-13 NOTE — Assessment & Plan Note (Signed)
-   PFT's 01/26/2012 FEV1  1.46 (39%) ratio 49 with 31% p B2, DLCO100% - PFT's 06/28/2013 > did not return as rec  - 08/12/2017 brought in empty dulera / costing 50$ per mon  - 08/12/2017  extensive coaching HFA effectiveness =    90% > changed to symb 160/ zero coupon  dtc asthma remains consistently inconsistent with use of ics despite warnings of asthma death related to excess dep on saba   I had an extended discussion with the patient reviewing all relevant studies completed to date and  lasting 15 to 20 minutes of a 25 minute visit    Each maintenance medication was reviewed in detail including most importantly the difference between maintenance and prns and under what circumstances the prns are to be triggered using an action plan format that is not reflected in the computer generated alphabetically organized AVS.    Please see AVS for specific instructions unique to this visit that I personally wrote and verbalized to the the pt in detail and then reviewed with pt  by my nurse highlighting any  changes in therapy recommended at today's visit to their plan of care.

## 2017-08-13 NOTE — Assessment & Plan Note (Addendum)
- -   FOB with lung biopsy 2005 positive NCG - on and off chronic prednisone until 09/2011 tapered off  A good rule of thumb is that    if there are active pulmonary symptoms the cxr will look much worse than the patient:  No evidence of this  scenario here/ strongly doubt active dz> rx airways only / just as one would in chronic asthma  With 6 days of pred only

## 2017-08-15 NOTE — Progress Notes (Signed)
Spoke with pt and notified of results per Dr. Wert. Pt verbalized understanding and denied any questions. 

## 2017-08-16 ENCOUNTER — Encounter: Payer: Self-pay | Admitting: Occupational Therapy

## 2017-08-18 ENCOUNTER — Encounter: Payer: Self-pay | Admitting: Occupational Therapy

## 2017-08-18 NOTE — Therapy (Signed)
[facility] [address] [place], [place], [address] Phone: [phone]   Fax:  [phone]  Occupational Therapy Treatment  Patient Details  Name: [name] MRN: [id number] Date of Birth: [birth year] Referring Provider: Dr. Scarlette Calico   Encounter Date: 08/18/2017    Past Medical History:  Diagnosis Date  . Asthma   . Sarcoidosis   . Sarcoidosis   . Sickle cell anemia (HCC)   . Sickle cell trait [facility])     Past Surgical History:  Procedure Laterality Date  . I&D EXTREMITY  01/19/2012   Procedure: IRRIGATION AND DEBRIDEMENT EXTREMITY;  Surgeon: Newt Minion, MD;  Location: [place];  Service: Orthopedics;  Laterality: Right;  . SHOULDER OPEN ROTATOR CUFF REPAIR  2007    There were no vitals filed for this visit.                          OT Short Term Goals - 08/18/17 0829      OT SHORT TERM GOAL #1   Title  Pt will be mod I with HEP - 08/23/2017    Status  New      OT SHORT TERM GOAL #2   Title  Pt will rate pain no greater than 4/10 with functional use of R hand    Status  New      OT SHORT TERM GOAL #3   Title  Pt will demonstrate improved grip strength by at least 5 pounds to assist with functional tasks (baseline = 70)    Status  New      OT SHORT TERM GOAL #4   Title  Pt will report greater ease in buttoning, zipping    Status  New      OT SHORT TERM GOAL #5   Title  Pt will be  mod I with splint wear and care.    Status  New        OT Long Term Goals - 08/18/17 2751      OT LONG TERM GOAL #1   Title  Pt will be mod I with upgraded HEP - 09/20/2017 (pt may not need all 8 weeks to reach goals)    Status  New      OT LONG TERM GOAL #2   Title  Pt will rate pain no greater than 2/10 in R hand with functional activities.     Status  New      OT LONG TERM GOAL #3   Title  Pt will demonstrate improved grip strength by at least 8 pounds to assist with functional  tasks (baseline= 70)    Status  New      OT LONG TERM GOAL #4   Title  Pt will demonstrate understanding of task adaptation for work and leisure activities.    Status  New            Plan - 08/18/17 0830    Clinical Impression Statement  Pt did not return after eval - pt cancelled 4 times stating "he had to work" Last time pt called to cancel pt stated he would "just get my OT at [employer] as I work there."  [employer] does not offer outpatient OT.  Will discharge pt at this time.     Plan  d/c from OT at our clinic per pt request due to work conflicts    Consulted and Agree with Plan of Care  Patient  Patient will benefit from skilled therapeutic intervention in order to improve the following deficits and impairments:     Visit Diagnosis: No diagnosis found.    Problem List Patient Active Problem List   Diagnosis Date Noted  . Essential hypertension, benign 05/24/2016  . Left lumbar radiculitis 02/04/2016  . Routine general medical examination at a health care facility 12/31/2015  . Degenerative disc disease, lumbar 12/31/2015  . Carpal tunnel syndrome of right wrist 10/02/2011  . Alcohol abuse 01/20/2009  . ALLERGIC RHINITIS 01/20/2009  . Severe persistent chronic asthma without complication 30/15/9968  . Sarcoidosis ([place]) 11/10/2007   OCCUPATIONAL THERAPY DISCHARGE SUMMARY  Visits from Start of Care: 1 (eval)  Current functional level related to goals / functional outcomes: See above - pt did not return after eval due work schedule conflicts   Remaining deficits: See evaluation   Education / Equipment: Issued splint on day of eval as well as HEP for tendon glides/AROM; discussed positions to avoid with hand/wrist.   Plan: Patient agrees to discharge.  Patient goals were not met. Patient is being discharged due to the patient's request.  ?????      Quay Burow , OTR/L 08/18/2017, 8:32 AM  [facility] [address] [place], [place], [address] Phone: [phone]   Fax:  [phone]  Name: Muzammil Bruins MRN: [id number] Date of Birth: [birth year]

## 2017-08-22 ENCOUNTER — Encounter: Payer: Self-pay | Admitting: Occupational Therapy

## 2017-08-23 ENCOUNTER — Encounter: Payer: Self-pay | Admitting: Occupational Therapy

## 2017-08-29 ENCOUNTER — Ambulatory Visit: Payer: 59 | Admitting: Allergy and Immunology

## 2017-08-30 ENCOUNTER — Encounter: Payer: Self-pay | Admitting: Occupational Therapy

## 2017-08-30 MED FILL — SYMBICORT 160-4.5 MCG INH: 160-4.5 | 30 days supply | Qty: 10 | Fill #0

## 2017-09-01 ENCOUNTER — Encounter: Payer: Self-pay | Admitting: Occupational Therapy

## 2017-09-06 ENCOUNTER — Encounter: Payer: Self-pay | Admitting: Occupational Therapy

## 2017-09-08 ENCOUNTER — Encounter: Payer: Self-pay | Admitting: Occupational Therapy

## 2017-09-22 ENCOUNTER — Other Ambulatory Visit: Payer: Self-pay | Admitting: Internal Medicine

## 2017-09-22 DIAGNOSIS — R0600 Dyspnea, unspecified: Secondary | ICD-10-CM

## 2017-09-23 ENCOUNTER — Ambulatory Visit (INDEPENDENT_AMBULATORY_CARE_PROVIDER_SITE_OTHER): Payer: 59 | Admitting: Internal Medicine

## 2017-09-23 ENCOUNTER — Encounter: Payer: Self-pay | Admitting: Internal Medicine

## 2017-09-23 VITALS — BP 122/82 | HR 151 | Ht 69.0 in | Wt 153.0 lb

## 2017-09-23 DIAGNOSIS — J4550 Severe persistent asthma, uncomplicated: Secondary | ICD-10-CM

## 2017-09-23 DIAGNOSIS — D869 Sarcoidosis, unspecified: Secondary | ICD-10-CM

## 2017-09-23 DIAGNOSIS — R0600 Dyspnea, unspecified: Secondary | ICD-10-CM

## 2017-09-23 LAB — PULMONARY FUNCTION TEST
DL/VA % PRED: 107 %
DL/VA: 4.92 ml/min/mmHg/L
DLCO COR % PRED: 87 %
DLCO COR: 27.19 ml/min/mmHg
DLCO UNC % PRED: 90 %
DLCO unc: 27.93 ml/min/mmHg
FEF 25-75 POST: 1.31 L/s
FEF 25-75 PRE: 1.14 L/s
FEF2575-%CHANGE-POST: 14 %
FEF2575-%PRED-POST: 40 %
FEF2575-%PRED-PRE: 34 %
FEV1-%Change-Post: 4 %
FEV1-%Pred-Post: 68 %
FEV1-%Pred-Pre: 65 %
FEV1-Post: 2.21 L
FEV1-Pre: 2.12 L
FEV1FVC-%CHANGE-POST: 1 %
FEV1FVC-%PRED-PRE: 79 %
FEV6-%Change-Post: 4 %
FEV6-%PRED-PRE: 82 %
FEV6-%Pred-Post: 85 %
FEV6-Post: 3.39 L
FEV6-Pre: 3.25 L
FEV6FVC-%Change-Post: 1 %
FEV6FVC-%Pred-Post: 102 %
FEV6FVC-%Pred-Pre: 100 %
FVC-%Change-Post: 3 %
FVC-%PRED-POST: 84 %
FVC-%PRED-PRE: 81 %
FVC-POST: 3.42 L
FVC-PRE: 3.32 L
POST FEV1/FVC RATIO: 65 %
POST FEV6/FVC RATIO: 99 %
PRE FEV1/FVC RATIO: 64 %
Pre FEV6/FVC Ratio: 98 %
RV % pred: 160 %
RV: 3.17 L
TLC % pred: 96 %
TLC: 6.55 L

## 2017-09-23 NOTE — Progress Notes (Signed)
PFT done today. 

## 2017-09-23 NOTE — Patient Instructions (Signed)
Keep your appointment to see the allergist and if you prove to have allergies and need allergy therapy then pulmonary follow up can just be as needed.   Once your allergy eval is complete and they confirm your  problem is not allergic then return here for regular follow up.

## 2017-09-23 NOTE — Progress Notes (Signed)
Subjective:    Patient ID: [name], male    DOB: Mar 21, 1966     MRN: [id number]    Brief patient profile:  [age] yobm former smoker quit around 1995,  dx with sarcoid 05/2004  by tbbx on prednisone off and on since then with inconsistent adherence documented.   December 13, 2008--Last seen 12/2007- did not return for follow up. Since last viist. Tapered off prednisone, he has taken on/off x one year. Would take when he was dyspneic, wheezing/ and cough. Over last 2 weeks increased cough, tightness, dyspnea, and wheezing. Almost out of prednisone. --CXR hyperinflation of the lungs with enlarged hilar tissue  Minimally increased.  ACE at 57. Predisone restarted at 20mg  once daily.     04/07/2012 f/u ov/[name] still on dulera 200 bid with second dose 8 pm and wakes up 2-4 am take 1 xopenex and goes back. Unable to cut grass 7/4 due to sob. No purulent sputum or sinus/ hb symptoms. rec Prednisone 10 mg take  4 each am x 2 days,   2 each am x 2 days,  1 each am x 2 days and stop Please schedule a follow up office visit in 4 weeks, sooner if needed with all inhalers in hand and don't fill any more prescriptions in meantime  05/08/2012 f/u ov/[name] cc missed appt  05/19/2012 f/u ov/[name] cc missed appt     05/18/2016  f/u ov/[name] re: dtca asthma/ documented non-adherence  Chief Complaint  Patient presents with  . Acute Visit    Pt c/o increased SOB, back pain, PND, losing voice X1 month.    admits not using dulera regularly, has "new rx" in hand = sample size only Onset of worse sob / chest tightness x one month indolent/ progressive with wheezing some better with saba which overuses at baseline and did not call as instructed  rec Plan A = Automatic = symbicort 160 Take 2 puffs first thing in am and then another 2 puffs about 12 hours later.  Work on Horticulturist, commercialperfecting  inhaler technique:    Plan B = Backup Only use your albuterol as a rescue medication Prednisone 10 mg take  4 each am x 2 days,   2 each am  x 2 days,  1 each am x 2 days and stop         09/23/2017  f/u ov/[name] re: mod asthma/ non-adherent  Chief Complaint  Patient presents with  . Follow-up    6 wk f/u for asthma and to discuss PFT results.   worse sob / wheeze cough  x several days / sneezing / no sore throat but came in for f/u pfts anyway  Rarely using saba now  [id number] = can walk nl pace, flat grade, can't hurry or go uphills or steps s sob     No obvious day to day or daytime variability or assoc excess/ purulent sputum or mucus plugs or hemoptysis or cp or chest tightness, subjective wheeze or overt   hb symptoms. No unusual exposure hx or h/o childhood pna/ asthma or knowledge of premature birth.  Sleeping ok flat without nocturnal  or early am exacerbation  of respiratory  c/o's or need for noct saba. Also denies any obvious fluctuation of symptoms with weather or environmental changes or other aggravating or alleviating factors except as outlined above   Current Allergies, Complete Past Medical History, Past Surgical History, Family History, and Social History were reviewed in [facility] electronic medical record.  ROS  The following are not active complaints unless bolded Hoarseness, sore throat, dysphagia, dental problems, itching, sneezing,  nasal congestion or discharge of excess mucus or purulent secretions, ear ache,   fever, chills, sweats, unintended wt loss or wt gain, classically pleuritic or exertional cp,  orthopnea pnd or leg swelling, presyncope, palpitations, abdominal pain, anorexia, nausea, vomiting, diarrhea  or change in bowel habits or change in bladder habits, change in stools or change in urine, dysuria, hematuria,  rash, arthralgias, visual complaints, headache, numbness, weakness or ataxia or problems with walking or coordination,  change in mood/affect or memory.        Current Meds  Medication Sig  . albuterol (PROAIR HFA) 108 (90 Base) MCG/ACT inhaler Inhale 2 puffs into the lungs every  4 (four) hours as needed for wheezing.  . budesonide-formoterol (SYMBICORT) 160-4.5 MCG/ACT inhaler Take 2 puffs first thing in am and then another 2 puffs about 12 hours later.  .                    Past Medical History:  SARCOIDOSIS (ICD-135).........................................................Marland Kitchen.Asmara Backs  - FOB with lung biopsy 2005 positive NCG   Sickle Cell Trait  Asthma      Family History:  Pos asthma, mother  Mother - Sickle Cell Disease, Asthma, CAD s/p MI (prior to 49 yo), s/p CVA (frequent hospitalizations)  Father - passed in [id number] 34(62 yo) from Prostate Cancer  4 siblings  Sister - multiple medical problems - details unk  Brother - ? Cancer  Brother - died 2/2 drug abuse     Social History:  Married. Lives with wife [name] and kids. 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 Quit smoking 1995. Drinks 1 bottle Brandy every other day. No drugs or hx of IVDU. Does lots of walking with job, but no regular exercise.           Objective:   Physical Exam  amb thin bm nad  Vital signs reviewed - Note on arrival 02 sats 97% on RA    - note pulse 151 p arrived from pfts p saba/ back to 100 prior to end of ov    wt   143 Feb 08, 2009 > 141 March 04, 2010  >   01/26/2012  158 > 04/07/2012  151>  158 02/09/2013 > 03/10/13 154 > 06/28/2013  153 > 12/16/2015  147 > 05/19/2016 145> 09/23/2017   153    HEENT: nl dentition, turbinates bilaterally, and oropharynx. Nl external ear canals without cough reflex   NECK :  without JVD/Nodes/TM/ nl carotid upstrokes bilaterally   LUNGS: no acc muscle use,  Nl contour chest with late exp wheeze bilaterally    CV:  RRR  no s3 or murmur or increase in P2, and no edema   ABD:  soft and nontender with nl inspiratory excursion in the supine position. No bruits or organomegaly appreciated, bowel sounds nl  MS:  Nl gait/ ext warm without deformities, calf tenderness, cyanosis or clubbing No obvious joint  restrictions   SKIN: warm and dry without lesions    NEURO:  alert, approp, nl sensorium with  no motor or cerebellar deficits apparent.                Assessment & Plan:

## 2017-09-24 ENCOUNTER — Encounter: Payer: Self-pay | Admitting: Internal Medicine

## 2017-09-24 NOTE — Assessment & Plan Note (Signed)
- -   FOB with lung biopsy 2005 positive NCG - on and off chronic prednisone until 09/2011 when daily prednisone tapered off  No evidence of active dz

## 2017-09-24 NOTE — Assessment & Plan Note (Addendum)
-   PFT's 01/26/2012 FEV1  1.46 (39%) ratio 49 with 31% p B2, DLCO100% - PFT's 06/28/2013 > did not return as rec  - EOS   05/14/11  = 0.2  - 08/12/2017 brought in empty dulera / costing 50$ per month  - 08/12/2017  extensive coaching HFA effectiveness =    90% > changed to symb 160/ zero coupon  - PFT's  09/23/2017  FEV1 2.21 (68 % ) ratio 65  p 4 % improvement from saba p symbicort 160 x 2  prior to study with DLCO  90/87 % corrects to 107  % for alv volume     Continues to struggle with med reconciliation/ adherence issues for reasons that aren't clear as he has access to meds thru [employer].   Plans eval by allergy p the 1st of the year  and may be candidate for biologics if his symptoms can't be controlled by regular use of ics/laba  Or with  possible addition of lama if we can be sure first that he's using laba/ics consistently first, which I've never been able to ascertain.  If establishes with allergy then best to follow there and not here, too, as has documented multiple  no shows here and this problem will likely double with two centers of care.   I had an extended discussion with the patient reviewing all relevant studies completed to date and  lasting 15 to 20 minutes of a 25 minute visit    Each maintenance medication was reviewed in detail including most importantly the difference between maintenance and prns and under what circumstances the prns are to be triggered using an action plan format that is not reflected in the computer generated alphabetically organized AVS.    Please see AVS for specific instructions unique to this visit that I personally wrote and verbalized to the the pt in detail and then reviewed with pt  by my nurse highlighting any  changes in therapy recommended at today's visit to their plan of care.

## 2017-10-06 ENCOUNTER — Telehealth: Payer: Self-pay | Admitting: Internal Medicine

## 2017-10-06 NOTE — Telephone Encounter (Signed)
Yes, this is ok with me 

## 2017-10-06 NOTE — Telephone Encounter (Signed)
I have received a renewal for FMLA from [employer]. They just want the dates updated and resigned if it is ok to renewal.   Dr.Jones are you ok with this?

## 2017-10-07 NOTE — Telephone Encounter (Signed)
Forms have been completed & placed in MD's box to sign. He will be back in the office on  1/7

## 2017-10-10 ENCOUNTER — Ambulatory Visit (INDEPENDENT_AMBULATORY_CARE_PROVIDER_SITE_OTHER): Payer: 59 | Admitting: Allergy and Immunology

## 2017-10-10 ENCOUNTER — Encounter: Payer: Self-pay | Admitting: Allergy and Immunology

## 2017-10-10 VITALS — BP 116/78 | HR 90 | Ht 69.0 in | Wt 151.4 lb

## 2017-10-10 DIAGNOSIS — H1013 Acute atopic conjunctivitis, bilateral: Secondary | ICD-10-CM | POA: Diagnosis not present

## 2017-10-10 DIAGNOSIS — Z0279 Encounter for issue of other medical certificate: Secondary | ICD-10-CM

## 2017-10-10 DIAGNOSIS — J3089 Other allergic rhinitis: Secondary | ICD-10-CM

## 2017-10-10 DIAGNOSIS — H1010 Acute atopic conjunctivitis, unspecified eye: Secondary | ICD-10-CM | POA: Insufficient documentation

## 2017-10-10 DIAGNOSIS — J4520 Mild intermittent asthma, uncomplicated: Secondary | ICD-10-CM | POA: Diagnosis not present

## 2017-10-10 DIAGNOSIS — D869 Sarcoidosis, unspecified: Secondary | ICD-10-CM

## 2017-10-10 DIAGNOSIS — J4551 Severe persistent asthma with (acute) exacerbation: Secondary | ICD-10-CM | POA: Diagnosis not present

## 2017-10-10 MED ORDER — METHYLPREDNISOLONE ACETATE 80 MG/ML IJ SUSP
80.0000 mg | Freq: Once | INTRAMUSCULAR | Status: AC
  Administered 2017-10-10: 80 mg via INTRAMUSCULAR

## 2017-10-10 MED ORDER — ALBUTEROL SULFATE (2.5 MG/3ML) 0.083% IN NEBU: 2.5000 mg | INHALATION_SOLUTION | RESPIRATORY_TRACT | 1 refills | Status: DC | PRN

## 2017-10-10 MED ORDER — OLOPATADINE HCL 0.2 % OP SOLN
1.0000 [drp] | Freq: Every day | OPHTHALMIC | 5 refills | Status: DC
Start: 2017-10-10 — End: 2018-11-10

## 2017-10-10 MED ORDER — LEVOCETIRIZINE DIHYDROCHLORIDE 5 MG PO TABS: 5.0000 mg | ORAL_TABLET | Freq: Every evening | ORAL | 5 refills | Status: DC

## 2017-10-10 MED ORDER — METHYLPREDNISOLONE ACETATE 80 MG/ML IJ SUSP: 80.0000 mg | Freq: Once | INTRAMUSCULAR | Status: DC

## 2017-10-10 MED ORDER — MONTELUKAST SODIUM 10 MG PO TABS: 10.0000 mg | ORAL_TABLET | Freq: Every day | ORAL | 5 refills | Status: DC

## 2017-10-10 MED ORDER — AZELASTINE HCL 0.15 % NA SOLN: 2.0000 | Freq: Two times a day (BID) | NASAL | 5 refills | Status: DC

## 2017-10-10 MED ORDER — TIOTROPIUM BROMIDE MONOHYDRATE 1.25 MCG/ACT IN AERS
2.0000 | INHALATION_SPRAY | Freq: Every day | RESPIRATORY_TRACT | 3 refills | Status: DC
Start: 2017-10-10 — End: 2018-03-13

## 2017-10-10 MED FILL — MONTELUKAST SOD 10 MG TAB: 10 | 30 days supply | Qty: 30 | Fill #0

## 2017-10-10 MED FILL — ALBUTEROL 0.083% INHAL SOLN: (2.5 MG/3ML | 5 days supply | Qty: 90 | Fill #0

## 2017-10-10 MED FILL — LEVOCETIRIZINE 5 MG TABLET: 5 | 30 days supply | Qty: 30 | Fill #0

## 2017-10-10 MED FILL — OLOPATADINE HCL 0.2 % SOLN: 0.2 | 25 days supply | Qty: 3 | Fill #0

## 2017-10-10 NOTE — Progress Notes (Signed)
New Patient Note  RE: [name] MRN: [id number] DOB: 06-03-1966 Date of Office Visit: 10/10/2017  Referring provider: Etta Grandchild, MD Primary care provider: Etta Grandchild, MD  Chief Complaint: Wheezing and Breathing Problem   History of present illness: [name] is a 52 y.o. male seen today in consultation requested by Sanda Linger, MD.  He was diagnosed with pulmonary sarcoidosis by biopsy approximately 10 years ago.  He reports, however, that his lower respiratory symptoms, including wheezing and dyspnea have progressed.  He is currently followed by his pulmonologist, Dr. Sherene Sires.  Despite compliance with Symbicort 160-4.5 g, 2 inhalations twice daily, he is experiencing dyspnea throughout the day and is awakened multiple times throughout the night because of wheezing.  He was diagnosed with asthma as a child, however his lower respiratory symptoms improved until he moved to [place] in 1999.  He is a former cigarette smoker having quit in 1996.  He is exposed to significant secondhand cigarette smoke as his wife, and other visiting relatives, smoke inside the house. [name] experiences nasal congestion and rhinorrhea "constantly", as well as postnasal drainage, occasional ocular pruritus, and occasional mild sinus pressure.  These symptoms occur year around but are most frequent and severe in the spring time in the fall.   Assessment and plan: Severe persistent asthma  The following labs have been ordered and drawn today: CBC with differential and serum IgE level to assess candidacy for biologic agents.  Continue Symbicort 160-4.5 g, 2 inhalations twice daily.  To maximize pulmonary deposition, a spacer has been provided along with instructions for its proper administration with an HFA inhaler.  A prescription has been provided for Spiriva Respimat 1.25 g, 2 inhalations daily.  A prescription has been provided for montelukast 10 mg daily at bedtime.  Per the  patient's request, a nebulizer has been provided along with a prescription for albuterol 0.083% every 6 hours if needed.  To hasten symptom relief, Depo-Medrol 80 mg was administered in the office.  The patient has been asked to contact me if his symptoms persist or progress.   The patient will be called when lab results returned with further recommendations.  Secondhand cigarette smoke should be strictly eliminated from the patients environment.  Perennial and seasonal allergic rhinitis  Aeroallergen avoidance measures have been discussed and provided in written form.  A prescription has been provided for levocetirizine, 5 mg daily as needed.  Montelukast has been prescribed (as above).  A prescription has been provided for azelastine nasal spray, 1-2 sprays per nostril 2 times daily as needed. Proper nasal spray technique has been discussed and demonstrated.   Nasal saline spray (i.e., Simply Saline) or nasal saline lavage (i.e., NeilMed) is recommended as needed and prior to medicated nasal sprays.  Once asthma is better controlled with a biologic agent, we may consider immunotherapy.  Allergic conjunctivitis  Treatment plan as outlined above for allergic rhinitis.  A prescription has been provided for Pataday, one drop per eye daily as needed.   Meds ordered this encounter  Medications  . Tiotropium Bromide Monohydrate (SPIRIVA RESPIMAT) 1.25 MCG/ACT AERS    Sig: Inhale 2 puffs into the lungs daily.    Dispense:  4 g    Refill:  3  . montelukast (SINGULAIR) 10 MG tablet    Sig: Take 1 tablet (10 mg total) by mouth at bedtime.    Dispense:  30 tablet    Refill:  5  . albuterol (PROVENTIL) (2.5 MG/3ML) 0.083%  nebulizer solution    Sig: Take 3 mLs (2.5 mg total) by nebulization every 4 (four) hours as needed for wheezing or shortness of breath.    Dispense:  75 mL    Refill:  1  . methylPREDNISolone acetate (DEPO-MEDROL) injection 80 mg  . levocetirizine (XYZAL) 5 MG  tablet    Sig: Take 1 tablet (5 mg total) by mouth every evening.    Dispense:  30 tablet    Refill:  5  . Azelastine HCl 0.15 % SOLN    Sig: Place 2 sprays into both nostrils 2 (two) times daily.    Dispense:  30 mL    Refill:  5  . Olopatadine HCl (PATADAY) 0.2 % SOLN    Sig: Place 1 drop into both eyes daily.    Dispense:  1 Bottle    Refill:  5  . methylPREDNISolone acetate (DEPO-MEDROL) injection 80 mg    Diagnostics: Spirometry: FVC was 1.56 L (39% predicted) and FEV1 was 0.84 L (26% predicted) with significant (730 mL, 135%) postbronchodilator improvement.  Please see scanned spirometry results for details. Epicutaneous testing: Positive to grass pollen, weed pollen, ragweed pollen, tree pollen, mold, cockroach antigen, and dust mite antigen. Intradermal testing: Positive to cat hair, dog epithelia, and molds.   Physical examination: Blood pressure 116/78, pulse 90, height 5\' 9"  (1.753 m), weight 151 lb 6.4 oz (68.7 kg), SpO2 98 %.  General: Alert, interactive, in no acute distress. HEENT: TMs pearly gray, turbinates edematous with clear discharge, post-pharynx mildly erythematous. Neck: Supple without lymphadenopathy. Lungs: Decreased breath sounds bilaterally without wheezing, rhonchi or rales. CV: Normal S1, S2 without murmurs. Abdomen: Nondistended, nontender. Skin: Warm and dry, without lesions or rashes. Extremities:  No clubbing, cyanosis or edema. Neuro:   Grossly intact.  Review of systems:  Review of systems negative except as noted in HPI / PMHx or noted below: Review of Systems  Constitutional: Negative.   HENT: Negative.   Eyes: Negative.   Respiratory: Negative.   Cardiovascular: Negative.   Gastrointestinal: Negative.   Genitourinary: Negative.   Musculoskeletal: Negative.   Skin: Negative.   Neurological: Negative.   Endo/Heme/Allergies: Negative.   Psychiatric/Behavioral: Negative.     Past medical history:  Past Medical History:  Diagnosis  Date  . Asthma   . Sarcoidosis   . Sarcoidosis   . Sickle cell anemia (HCC)   . Sickle cell trait (HCC)     Past surgical history:  Past Surgical History:  Procedure Laterality Date  . I&D EXTREMITY  01/19/2012   Procedure: IRRIGATION AND DEBRIDEMENT EXTREMITY;  Surgeon: Nadara MustardMarcus V Duda, MD;  Location: [facility] OR;  Service: Orthopedics;  Laterality: Right;  . SHOULDER OPEN ROTATOR CUFF REPAIR  2007    Family history: Family History  Problem Relation Age of Onset  . Asthma Mother   . Coronary artery disease Mother   . Stroke Mother   . Heart attack Mother   . Sickle cell trait Mother   . Prostate cancer Father   . Cancer Father [id number]       prostate  . Alcohol abuse Brother   . Early death Brother   . Drug abuse Brother   . Diabetes Neg Hx   . Heart disease Neg Hx   . Hyperlipidemia Neg Hx   . Hypertension Neg Hx   . Kidney disease Neg Hx     Social history: Social History   Socioeconomic History  . Marital status: Married    Spouse name: Not  on file  . Number of children: Not on file  . Years of education: Not on file  . Highest education level: Not on file  Social Needs  . Financial resource strain: Not on file  . Food insecurity - worry: Not on file  . Food insecurity - inability: Not on file  . Transportation needs - medical: Not on file  . Transportation needs - non-medical: Not on file  Occupational History  . Not on file  Tobacco Use  . Smoking status: Former Smoker    Packs/day: 0.50    Years: 10.00    Pack years: 5.00    Last attempt to quit: 10/04/1993    Years since quitting: 24.0  . Smokeless tobacco: Never Used  Substance and Sexual Activity  . Alcohol use: Yes    Alcohol/week: 28.8 oz    Types: 3 Standard drinks or equivalent, 25 Shots of liquor, 20 Cans of beer per week  . Drug use: No  . Sexual activity: Yes  Other Topics Concern  . Not on file  Social History Narrative  . Not on file   Environmental History: The patient lives in a house with  carpeting throughout and central air/heat.  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  There is no known mold/water damage in the home.  Allergies as of 10/10/2017   No Known Allergies     Medication List        Accurate as of 10/10/17  5:31 PM. Always use your most recent med list.          albuterol 108 (90 Base) MCG/ACT inhaler Commonly known as:  PROAIR HFA Inhale 2 puffs into the lungs every 4 (four) hours as needed for wheezing.   albuterol (2.5 MG/3ML) 0.083% nebulizer solution Commonly known as:  PROVENTIL Take 3 mLs (2.5 mg total) by nebulization every 4 (four) hours as needed for wheezing or shortness of breath.   Azelastine HCl 0.15 % Soln Place 2 sprays into both nostrils 2 (two) times daily.   budesonide-formoterol 160-4.5 MCG/ACT inhaler Commonly known as:  SYMBICORT Take 2 puffs first thing in am and then another 2 puffs about 12 hours later.   levocetirizine 5 MG tablet Commonly known as:  XYZAL Take 1 tablet (5 mg total) by mouth every evening.   montelukast 10 MG tablet Commonly known as:  SINGULAIR Take 1 tablet (10 mg total) by mouth at bedtime.   Olopatadine HCl 0.2 % Soln Commonly known as:  PATADAY Place 1 drop into both eyes daily.   Tiotropium Bromide Monohydrate 1.25 MCG/ACT Aers Commonly known as:  SPIRIVA RESPIMAT Inhale 2 puffs into the lungs daily.       Known medication allergies: No Known Allergies  I appreciate the opportunity to take part in Alston's care. Please do not hesitate to contact me with questions.  Sincerely,   R. Jorene Guest, MD

## 2017-10-10 NOTE — Assessment & Plan Note (Signed)
   Aeroallergen avoidance measures have been discussed and provided in written form.  A prescription has been provided for levocetirizine, 5 mg daily as needed.  Montelukast has been prescribed (as above).  A prescription has been provided for azelastine nasal spray, 1-2 sprays per nostril 2 times daily as needed. Proper nasal spray technique has been discussed and demonstrated.   Nasal saline spray (i.e., Simply Saline) or nasal saline lavage (i.e., NeilMed) is recommended as needed and prior to medicated nasal sprays.  Once asthma is better controlled with a biologic agent, we may consider immunotherapy.

## 2017-10-10 NOTE — Patient Instructions (Addendum)
Severe persistent asthma  The following labs have been ordered and drawn today: CBC with differential and serum IgE level to assess candidacy for biologic agents.  Continue Symbicort 160-4.5 g, 2 inhalations twice daily.  To maximize pulmonary deposition, a spacer has been provided along with instructions for its proper administration with an HFA inhaler.  A prescription has been provided for Spiriva Respimat 1.25 g, 2 inhalations daily.  A prescription has been provided for montelukast 10 mg daily at bedtime.  Per the patient's request, a nebulizer has been provided along with a prescription for albuterol 0.083% every 6 hours if needed.  To hasten symptom relief, Depo-Medrol 80 mg was administered in the office.  The patient has been asked to contact me if his symptoms persist or progress.   The patient will be called when lab results returned with further recommendations.  Secondhand cigarette smoke should be strictly eliminated from the patients environment.  Perennial and seasonal allergic rhinitis  Aeroallergen avoidance measures have been discussed and provided in written form.  A prescription has been provided for levocetirizine, 5 mg daily as needed.  Montelukast has been prescribed (as above).  A prescription has been provided for azelastine nasal spray, 1-2 sprays per nostril 2 times daily as needed. Proper nasal spray technique has been discussed and demonstrated.   Nasal saline spray (i.e., Simply Saline) or nasal saline lavage (i.e., NeilMed) is recommended as needed and prior to medicated nasal sprays.  Once asthma is better controlled with a biologic agent, we may consider immunotherapy.  Allergic conjunctivitis  Treatment plan as outlined above for allergic rhinitis.  A prescription has been provided for Pataday, one drop per eye daily as needed.   Return in about 4 weeks (around 11/07/2017), or if symptoms worsen or fail to improve.  Reducing Pollen  Exposure  The American Academy of Allergy, Asthma and Immunology suggests the following steps to reduce your exposure to pollen during allergy seasons.    1. Do not hang sheets or clothing out to dry; pollen may collect on these items. 2. Do not mow lawns or spend time around freshly cut grass; mowing stirs up pollen. 3. Keep windows closed at night.  Keep car windows closed while driving. 4. Minimize morning activities outdoors, a time when pollen counts are usually at their highest. 5. Stay indoors as much as possible when pollen counts or humidity is high and on windy days when pollen tends to remain in the air longer. 6. Use air conditioning when possible.  Many air conditioners have filters that trap the pollen spores. 7. Use a HEPA room air filter to remove pollen form the indoor air you breathe.   Control of House Dust Mite Allergen  House dust mites play a major role in allergic asthma and rhinitis.  They occur in environments with high humidity wherever human skin, the food for dust mites is found. High levels have been detected in dust obtained from mattresses, pillows, carpets, upholstered furniture, bed covers, clothes and soft toys.  The principal allergen of the house dust mite is found in its feces.  A gram of dust may contain 1,000 mites and 250,000 fecal particles.  Mite antigen is easily measured in the air during house cleaning activities.    1. Encase mattresses, including the box spring, and pillow, in an air tight cover.  Seal the zipper end of the encased mattresses with wide adhesive tape. 2. Wash the bedding in water of 130 degrees Farenheit weekly.  Avoid cotton  comforters/quilts and flannel bedding: the most ideal bed covering is the dacron comforter. 3. Remove all upholstered furniture from the bedroom. 4. Remove carpets, carpet padding, rugs, and non-washable window drapes from the bedroom.  Wash drapes weekly or use plastic window coverings. 5. Remove all  non-washable stuffed toys from the bedroom.  Wash stuffed toys weekly. 6. Have the room cleaned frequently with a vacuum cleaner and a damp dust-mop.  The patient should not be in a room which is being cleaned and should wait 1 hour after cleaning before going into the room. 7. Close and seal all heating outlets in the bedroom.  Otherwise, the room will become filled with dust-laden air.  An electric heater can be used to heat the room. Reduce indoor humidity to less than 50%.  Do not use a humidifier.  Control of Dog or Cat Allergen  Avoidance is the best way to manage a dog or cat allergy. If you have a dog or cat and are allergic to dog or cats, consider removing the dog or cat from the home. If you have a dog or cat but don't want to find it a new home, or if your family wants a pet even though someone in the household is allergic, here are some strategies that may help keep symptoms at bay:  1. Keep the pet out of your bedroom and restrict it to only a few rooms. Be advised that keeping the dog or cat in only one room will not limit the allergens to that room. 2. Don't pet, hug or kiss the dog or cat; if you do, wash your hands with soap and water. 3. High-efficiency particulate air (HEPA) cleaners run continuously in a bedroom or living room can reduce allergen levels over time. 4. Place electrostatic material sheet in the air inlet vent in the bedroom. 5. Regular use of a high-efficiency vacuum cleaner or a central vacuum can reduce allergen levels. 6. Giving your dog or cat a bath at least once a week can reduce airborne allergen.  Control of Mold Allergen  Mold and fungi can grow on a variety of surfaces provided certain temperature and moisture conditions exist.  Outdoor molds grow on plants, decaying vegetation and soil.  The major outdoor mold, Alternaria and Cladosporium, are found in very high numbers during hot and dry conditions.  Generally, a late Summer - Fall peak is seen for  common outdoor fungal spores.  Rain will temporarily lower outdoor mold spore count, but counts rise rapidly when the rainy period ends.  The most important indoor molds are Aspergillus and Penicillium.  Dark, humid and poorly ventilated basements are ideal sites for mold growth.  The next most common sites of mold growth are the bathroom and the kitchen.  Outdoor [employer] 1. Use air conditioning and keep windows closed 2. Avoid exposure to decaying vegetation. 3. Avoid leaf raking. 4. Avoid grain handling. 5. Consider wearing a face mask if working in moldy areas.  Indoor Mold Control 1. Maintain humidity below 50%. 2. Clean washable surfaces with 5% bleach solution. 3. Remove sources e.g. Contaminated carpets.  Control of Cockroach Allergen  Cockroach allergen has been identified as an important cause of acute attacks of asthma, especially in urban settings.  There are fifty-five species of cockroach that exist in the [place], however only three, the [place], [place] species produce allergen that can affect patients with Asthma.  Allergens can be obtained from fecal particles, egg casings and secretions from  cockroaches.    1. Remove food sources. 2. Reduce access to water. 3. Seal access and entry points. 4. Spray runways with 0.5-1% Diazinon or Chlorpyrifos 5. Blow boric acid power under stoves and refrigerator. 6. Place bait stations (hydramethylnon) at feeding sites.

## 2017-10-10 NOTE — Assessment & Plan Note (Signed)
   Treatment plan as outlined above for allergic rhinitis.  A prescription has been provided for Pataday, one drop per eye daily as needed. 

## 2017-10-10 NOTE — Assessment & Plan Note (Addendum)
   The following labs have been ordered and drawn today: CBC with differential and serum IgE level to assess candidacy for biologic agents.  Continue Symbicort 160-4.5 g, 2 inhalations twice daily.  To maximize pulmonary deposition, a spacer has been provided along with instructions for its proper administration with an HFA inhaler.  A prescription has been provided for Spiriva Respimat 1.25 g, 2 inhalations daily.  A prescription has been provided for montelukast 10 mg daily at bedtime.  Per the patient's request, a nebulizer has been provided along with a prescription for albuterol 0.083% every 6 hours if needed.  To hasten symptom relief, Depo-Medrol 80 mg was administered in the office.  The patient has been asked to contact me if his symptoms persist or progress.   The patient will be called when lab results returned with further recommendations.  Secondhand cigarette smoke should be strictly eliminated from the patients environment.

## 2017-10-11 MED FILL — AZELASTINE 0.1% (137 MCG) S: 0.1 | 30 days supply | Qty: 30 | Fill #0

## 2017-10-12 NOTE — Telephone Encounter (Signed)
Forms have been faxed to [employer], Copy sent to scan &charged for. Patient informed, He will pick up his copy.

## 2017-10-13 LAB — CBC WITH DIFFERENTIAL/PLATELET
Basophils Absolute: 0 10*3/uL (ref 0.0–0.2)
Basos: 0 %
EOS (ABSOLUTE): 0.1 10*3/uL (ref 0.0–0.4)
Eos: 3 %
Hematocrit: 46.4 % (ref 37.5–51.0)
Hemoglobin: 16.6 g/dL (ref 13.0–17.7)
Immature Grans (Abs): 0 10*3/uL (ref 0.0–0.1)
Immature Granulocytes: 0 %
Lymphocytes Absolute: 1.7 10*3/uL (ref 0.7–3.1)
Lymphs: 36 %
MCH: 30 pg (ref 26.6–33.0)
MCHC: 35.8 g/dL — ABNORMAL HIGH (ref 31.5–35.7)
MCV: 84 fL (ref 79–97)
Monocytes Absolute: 0.7 10*3/uL (ref 0.1–0.9)
Monocytes: 14 %
Neutrophils Absolute: 2.2 10*3/uL (ref 1.4–7.0)
Neutrophils: 47 %
Platelets: 253 10*3/uL (ref 150–379)
RBC: 5.53 x10E6/uL (ref 4.14–5.80)
RDW: 13.8 % (ref 12.3–15.4)
WBC: 4.8 10*3/uL (ref 3.4–10.8)

## 2017-10-13 LAB — IGE: IgE (Immunoglobulin E), Serum: 1201 IU/mL — ABNORMAL HIGH (ref 0–100)

## 2017-11-07 ENCOUNTER — Other Ambulatory Visit: Payer: Self-pay | Admitting: *Deleted

## 2017-11-07 ENCOUNTER — Ambulatory Visit (INDEPENDENT_AMBULATORY_CARE_PROVIDER_SITE_OTHER): Payer: 59 | Admitting: Allergy and Immunology

## 2017-11-07 ENCOUNTER — Encounter: Payer: Self-pay | Admitting: Allergy and Immunology

## 2017-11-07 VITALS — BP 122/78 | HR 86 | Ht 69.0 in | Wt 151.0 lb

## 2017-11-07 DIAGNOSIS — J3089 Other allergic rhinitis: Secondary | ICD-10-CM | POA: Diagnosis not present

## 2017-11-07 DIAGNOSIS — J4550 Severe persistent asthma, uncomplicated: Secondary | ICD-10-CM | POA: Diagnosis not present

## 2017-11-07 MED ORDER — OMALIZUMAB 150 MG ~~LOC~~ SOLR: 375.0000 mg | SUBCUTANEOUS | 11 refills | Status: DC

## 2017-11-07 NOTE — Telephone Encounter (Signed)
Called patient and advised Xolair approval. [facility] and [facility].  Advised patient to get Xolair copay card and provide info to pharmacy to make drug $5 out of pocket.  Instructed patient that once pharmacy contact him to pick up Rx to contact [place] office to make appt to start therapy

## 2017-11-07 NOTE — Patient Instructions (Signed)
Severe persistent asthma Despite improvement, his asthma is still suboptimally controlled in the context of compliance with multiple asthma controller medications.  He will initiate omalizumab (Xolair) injections this week.  For now, continue Symbicort 160-4.5 g, 2 inhalations via spacer device twice a day, Spiriva Respimat 1.25 g, 2 inhalations daily, montelukast 10 mg daily at bedtime, and albuterol every 6 hours if needed.  Subjective and objective measures of pulmonary function will be followed and the treatment plan will be adjusted accordingly.  Perennial and seasonal allergic rhinitis  I had a discussion with [facility] regarding the possibility of initiating aeroallergen immunotherapy while using Xolair as a bridge during buildup injections.  We will wait to see how well controlled his asthma is after starting Xolair before taking this step.  For now, continue appropriate allergen avoidance measures, levocetirizine as needed, montelukast daily, azelastine nasal spray as needed, and nasal saline irrigation if needed.   Return in about 3 months (around 02/04/2018), or if symptoms worsen or fail to improve.

## 2017-11-07 NOTE — Assessment & Plan Note (Signed)
Despite improvement, his asthma is still suboptimally controlled in the context of compliance with multiple asthma controller medications.  He will initiate omalizumab (Xolair) injections this week.  For now, continue Symbicort 160-4.5 g, 2 inhalations via spacer device twice a day, Spiriva Respimat 1.25 g, 2 inhalations daily, montelukast 10 mg daily at bedtime, and albuterol every 6 hours if needed.  Subjective and objective measures of pulmonary function will be followed and the treatment plan will be adjusted accordingly.

## 2017-11-07 NOTE — Progress Notes (Signed)
Follow-up Note  RE: [name] MRN: [id number] DOB: 31-May-1966 Date of Office Visit: 11/07/2017  Primary care provider: Etta GrandchildJones, Thomas L, MD Referring provider: Etta GrandchildJones, Thomas L, MD  History of present illness: Ryan Rudyrone Touchet is a 52 y.o. male with severe persistent asthma and allergic rhinoconjunctivitis presenting today for follow-up.  He reports that the interval since his previous visit his upper and lower respiratory symptoms have improved.  However, he still experiences nocturnal awakenings due to lower respiratory symptoms requiring albuterol rescue every night of the week.  He is scheduled to start Xolair injections this week.   Assessment and plan: Severe persistent asthma Despite improvement, his asthma is still suboptimally controlled in the context of compliance with multiple asthma controller medications.  He will initiate omalizumab (Xolair) injections this week.  For now, continue Symbicort 160-4.5 g, 2 inhalations via spacer device twice a day, Spiriva Respimat 1.25 g, 2 inhalations daily, montelukast 10 mg daily at bedtime, and albuterol every 6 hours if needed.  Subjective and objective measures of pulmonary function will be followed and the treatment plan will be adjusted accordingly.  Perennial and seasonal allergic rhinitis  I had a discussion with [facility] regarding the possibility of initiating aeroallergen immunotherapy while using Xolair as a bridge during buildup injections.  We will wait to see how well controlled his asthma is after starting Xolair before taking this step.  For now, continue appropriate allergen avoidance measures, levocetirizine as needed, montelukast daily, azelastine nasal spray as needed, and nasal saline irrigation if needed.   Diagnostics: Spirometry reveals an FVC of 5.62 L and an FEV1 of 1.55 L (48% predicted) and an FEV1 ratio of 76%.  The patient's FEV1 is improved compared with his previous study.  Please see scanned  spirometry results for details.    Physical examination: Blood pressure 122/78, pulse 86, height 5\' 9"  (1.753 m), weight 151 lb (68.5 kg), SpO2 96 %.  General: Alert, interactive, in no acute distress. HEENT: TMs pearly gray, turbinates moderately edematous without discharge, post-pharynx mildly erythematous. Neck: Supple without lymphadenopathy. Lungs: Mildly decreased breath sounds bilaterally without wheezing, rhonchi or rales. CV: Normal S1, S2 without murmurs. Skin: Warm and dry, without lesions or rashes.  The following portions of the patient's history were reviewed and updated as appropriate: allergies, current medications, past family history, past medical history, past social history, past surgical history and problem list.  Allergies as of 11/07/2017   No Known Allergies     Medication List        Accurate as of 11/07/17  5:25 PM. Always use your most recent med list.          albuterol 108 (90 Base) MCG/ACT inhaler Commonly known as:  PROAIR HFA Inhale 2 puffs into the lungs every 4 (four) hours as needed for wheezing.   albuterol (2.5 MG/3ML) 0.083% nebulizer solution Commonly known as:  PROVENTIL Take 3 mLs (2.5 mg total) by nebulization every 4 (four) hours as needed for wheezing or shortness of breath.   Azelastine HCl 0.15 % Soln Place 2 sprays into both nostrils 2 (two) times daily.   budesonide-formoterol 160-4.5 MCG/ACT inhaler Commonly known as:  SYMBICORT Take 2 puffs first thing in am and then another 2 puffs about 12 hours later.   levocetirizine 5 MG tablet Commonly known as:  XYZAL Take 1 tablet (5 mg total) by mouth every evening.   montelukast 10 MG tablet Commonly known as:  SINGULAIR Take 1 tablet (10 mg total) by mouth  at bedtime.   Olopatadine HCl 0.2 % Soln Commonly known as:  PATADAY Place 1 drop into both eyes daily.   omalizumab 150 MG injection Commonly known as:  XOLAIR Inject 375 mg into the skin every 14 (fourteen) days.     Tiotropium Bromide Monohydrate 1.25 MCG/ACT Aers Commonly known as:  SPIRIVA RESPIMAT Inhale 2 puffs into the lungs daily.       No Known Allergies  I appreciate the opportunity to take part in Henryk's care. Please do not hesitate to contact me with questions.  Sincerely,   R. Jorene Guest, MD

## 2017-11-07 NOTE — Assessment & Plan Note (Signed)
   I had a discussion with [facility] regarding the possibility of initiating aeroallergen immunotherapy while using Xolair as a bridge during buildup injections.  We will wait to see how well controlled his asthma is after starting Xolair before taking this step.  For now, continue appropriate allergen avoidance measures, levocetirizine as needed, montelukast daily, azelastine nasal spray as needed, and nasal saline irrigation if needed.

## 2017-11-08 ENCOUNTER — Telehealth: Payer: Self-pay | Admitting: Pharmacist

## 2017-11-08 MED FILL — XOLAIR 150 MG SOLR: 150 | 28 days supply | Qty: 6 | Fill #0

## 2017-11-08 NOTE — Telephone Encounter (Signed)
Called patient to review [facility] and to set up appointment. Patient will call back to schedule appt, likely for Thursday or Friday this week.

## 2017-11-09 ENCOUNTER — Ambulatory Visit (HOSPITAL_BASED_OUTPATIENT_CLINIC_OR_DEPARTMENT_OTHER): Payer: 59 | Admitting: Pharmacist

## 2017-11-09 ENCOUNTER — Ambulatory Visit (INDEPENDENT_AMBULATORY_CARE_PROVIDER_SITE_OTHER): Payer: 59

## 2017-11-09 ENCOUNTER — Ambulatory Visit: Payer: 59

## 2017-11-09 ENCOUNTER — Encounter: Payer: Self-pay | Admitting: Pharmacist

## 2017-11-09 DIAGNOSIS — J4550 Severe persistent asthma, uncomplicated: Secondary | ICD-10-CM | POA: Diagnosis not present

## 2017-11-09 DIAGNOSIS — Z79899 Other long term (current) drug therapy: Secondary | ICD-10-CM

## 2017-11-09 MED ORDER — OMALIZUMAB 150 MG ~~LOC~~ SOLR
375.0000 mg | SUBCUTANEOUS | Status: DC
  Administered 2017-11-09 – 2018-04-11 (×12): 375 mg via SUBCUTANEOUS

## 2017-11-09 MED ORDER — EPINEPHRINE 0.3 MG/0.3ML IJ SOAJ: 0.3000 mg | Freq: Once | INTRAMUSCULAR | 2 refills | Status: AC

## 2017-11-09 MED ORDER — OMALIZUMAB 150 MG ~~LOC~~ SOLR: 375.0000 mg | SUBCUTANEOUS | 10 refills | Status: DC

## 2017-11-09 MED FILL — EPINEPHRINE 0.3 MG AUTO-INJ: 0.3 | 30 days supply | Qty: 2 | Fill #0

## 2017-11-09 NOTE — Progress Notes (Signed)
   S: [facility] for review of their specialty medication therapy.  Patient is currently taking Xolair for asthma. Patient is managed by Dr. Nunzio CobbsBobbitt for this.   Adherence: just had his first dose today.  Efficacy: just had his first dose today  Dosing: Give subcutaneously.  Can be dosed every 2 or 4 weeks based on baseline serum IgE levels and body weight.  Asthma: 375 mg into the skin every 14 days.  Dose adjustments: Renal: no dose adjustments  Hepatic: no dose adjustments  Toxicity: Severe hypersensitivity reaction or anaphylaxis: Discontinue treatment. Fever, arthralgia, and rash: Discontinue treatment if this constellation of symptoms occurs.  Drug-drug interactions: none  Monitoring: Injection site reactions: reports that the injection site is itchy but he had the shot 20 minutes ago. He is going back to the allergist in 30 minutes for them to see his injection site.    O:     Lab Results  Component Value Date   WBC 4.8 10/10/2017   HGB 16.6 10/10/2017   HCT 46.4 10/10/2017   MCV 84 10/10/2017   PLT 253 10/10/2017      Chemistry      Component Value Date/Time   NA 138 05/13/2017 1154   K 3.8 05/13/2017 1154   CL 104 05/13/2017 1154   CO2 30 05/13/2017 1154   BUN 12 05/13/2017 1154   CREATININE 1.47 05/13/2017 1154   CREATININE 0.98 04/18/2013 1034      Component Value Date/Time   CALCIUM 8.9 05/13/2017 1154   ALKPHOS 48 05/13/2017 1154   AST 34 05/13/2017 1154   ALT 32 05/13/2017 1154   BILITOT 0.6 05/13/2017 1154       A/P: 1. Medication review: patient currently on Xolair for asthma. Reviewed the medication with the patient, including the following: Xolair, omalizumab, is a novel IgE blocker.  It appears to reduce rates of hospitalizations, ER visits and unscheduled physician visits due asthma exacerbations when added to standard therapy.  Studies also show a reduction in steroid requirements and  improvement in quality of life.  Patient should always have an EpiPen readily available in the event of anaphylaxis. No recommendations for any changes at this time.   Ryan BloodStacey Karl Treysean Morris, PharmD, BCPS, BCACP, CPP Clinical Pharmacist Practitioner  [facility] [phone]

## 2017-11-10 DIAGNOSIS — H52221 Regular astigmatism, right eye: Secondary | ICD-10-CM | POA: Diagnosis not present

## 2017-11-10 DIAGNOSIS — H35013 Changes in retinal vascular appearance, bilateral: Secondary | ICD-10-CM | POA: Diagnosis not present

## 2017-11-10 DIAGNOSIS — H5201 Hypermetropia, right eye: Secondary | ICD-10-CM | POA: Diagnosis not present

## 2017-11-24 ENCOUNTER — Ambulatory Visit (INDEPENDENT_AMBULATORY_CARE_PROVIDER_SITE_OTHER): Payer: 59 | Admitting: *Deleted

## 2017-11-24 DIAGNOSIS — J4550 Severe persistent asthma, uncomplicated: Secondary | ICD-10-CM | POA: Diagnosis not present

## 2017-12-05 MED FILL — XOLAIR 150 MG SOLR: 150 | 28 days supply | Qty: 6 | Fill #0

## 2017-12-08 ENCOUNTER — Ambulatory Visit (INDEPENDENT_AMBULATORY_CARE_PROVIDER_SITE_OTHER): Payer: 59 | Admitting: *Deleted

## 2017-12-08 DIAGNOSIS — J4550 Severe persistent asthma, uncomplicated: Secondary | ICD-10-CM

## 2017-12-22 ENCOUNTER — Ambulatory Visit (INDEPENDENT_AMBULATORY_CARE_PROVIDER_SITE_OTHER): Payer: 59

## 2017-12-22 DIAGNOSIS — J4550 Severe persistent asthma, uncomplicated: Secondary | ICD-10-CM

## 2018-01-02 MED FILL — XOLAIR 150 MG SOLR: 150 | 28 days supply | Qty: 6 | Fill #1

## 2018-01-03 ENCOUNTER — Ambulatory Visit (INDEPENDENT_AMBULATORY_CARE_PROVIDER_SITE_OTHER): Payer: 59 | Admitting: *Deleted

## 2018-01-03 DIAGNOSIS — J4550 Severe persistent asthma, uncomplicated: Secondary | ICD-10-CM | POA: Diagnosis not present

## 2018-01-05 ENCOUNTER — Ambulatory Visit: Payer: Self-pay

## 2018-01-17 ENCOUNTER — Ambulatory Visit (INDEPENDENT_AMBULATORY_CARE_PROVIDER_SITE_OTHER): Payer: 59 | Admitting: *Deleted

## 2018-01-17 DIAGNOSIS — J4550 Severe persistent asthma, uncomplicated: Secondary | ICD-10-CM

## 2018-01-26 MED FILL — XOLAIR 150 MG SOLR: 150 | 28 days supply | Qty: 6 | Fill #2

## 2018-01-31 ENCOUNTER — Ambulatory Visit (INDEPENDENT_AMBULATORY_CARE_PROVIDER_SITE_OTHER): Payer: 59 | Admitting: *Deleted

## 2018-01-31 DIAGNOSIS — J4550 Severe persistent asthma, uncomplicated: Secondary | ICD-10-CM | POA: Diagnosis not present

## 2018-02-14 ENCOUNTER — Ambulatory Visit (INDEPENDENT_AMBULATORY_CARE_PROVIDER_SITE_OTHER): Payer: 59 | Admitting: Allergy and Immunology

## 2018-02-14 ENCOUNTER — Ambulatory Visit: Payer: 59 | Admitting: Allergy and Immunology

## 2018-02-14 ENCOUNTER — Ambulatory Visit: Payer: 59

## 2018-02-14 ENCOUNTER — Encounter: Payer: Self-pay | Admitting: Allergy and Immunology

## 2018-02-14 VITALS — BP 140/92 | HR 100 | Temp 98.1°F | Resp 16 | Ht 71.0 in | Wt 154.8 lb

## 2018-02-14 DIAGNOSIS — J4521 Mild intermittent asthma with (acute) exacerbation: Secondary | ICD-10-CM | POA: Diagnosis not present

## 2018-02-14 DIAGNOSIS — J3089 Other allergic rhinitis: Secondary | ICD-10-CM | POA: Diagnosis not present

## 2018-02-14 DIAGNOSIS — J4551 Severe persistent asthma with (acute) exacerbation: Secondary | ICD-10-CM | POA: Diagnosis not present

## 2018-02-14 DIAGNOSIS — I10 Essential (primary) hypertension: Secondary | ICD-10-CM

## 2018-02-14 DIAGNOSIS — H1013 Acute atopic conjunctivitis, bilateral: Secondary | ICD-10-CM | POA: Diagnosis not present

## 2018-02-14 MED ORDER — TIOTROPIUM BROMIDE MONOHYDRATE 1.25 MCG/ACT IN AERS: 2.0000 | INHALATION_SPRAY | Freq: Every day | RESPIRATORY_TRACT | 5 refills | Status: DC

## 2018-02-14 MED ORDER — MONTELUKAST SODIUM 10 MG PO TABS: 10.0000 mg | ORAL_TABLET | Freq: Every day | ORAL | 5 refills | Status: DC

## 2018-02-14 NOTE — Progress Notes (Signed)
Follow-up Note  RE: [name] MRN: [id number] DOB: 13-Mar-1966 Date of Office Visit: 02/14/2018  Primary care provider: Etta Grandchild, MD Referring provider: Etta Grandchild, MD  History of present illness: [name] is a 52 y.o. male with severe persistent asthma and allergic rhinoconjunctivitis presenting today for follow-up.  He was last seen in this clinic on November 07, 2017.  He reports that he has been experiencing some increased wheezing over the past 24 hours.  He has been receiving omalizumab (Xolair) injections every 2 weeks since early February without problems or complications.  He claims to be using his Symbicort 160-4.5, 2 inhalations twice daily.  He admits that he has not been using a spacer device.  In addition, he admits that he has not been taking Spiriva Respimat.  He reports that he needs a refill for montelukast.  He attempted to mow his lawn yesterday but had great difficulty doing so.  He has no nasal allergy symptom complaints today.  He admits that he has not been taking his blood pressure medication for the past month.  Assessment and plan: Severe persistent asthma Currently with suboptimal control.  Prednisone has been provided, 40 mg x3 days, 20 mg x1 day, 10 mg x1 day, then stop.  Continue omalizumab (Xolair) injections as prescribed.  The importance of compliance has been emphasized.  Symbicort 160-4.5 g, 2 inhalations twice daily.  To maximize pulmonary deposition, a spacer has been provided along with instructions for its proper administration with an HFA inhaler.  Restart Spiriva Respimat, 1.25 g 2 inhalations daily.  A refill prescription has been provided for montelukast 10 mg daily at bedtime.  Continue albuterol every 6 hours if needed.  The patient has been asked to contact me if his symptoms persist or progress. Otherwise, he may return for follow up in 2 months.  Perennial and seasonal allergic rhinitis  For now, continue  appropriate allergen avoidance measures, levocetirizine as needed, montelukast daily, azelastine nasal spray as needed, and nasal saline irrigation if needed.  We will consider initiating aeroallergen immunotherapy while using Xolair as a bridge during buildup injections.  However, his asthma will have to be under better control before initiating aeroallergen immunotherapy.  Allergic conjunctivitis  Treatment plan as outlined above for allergic rhinitis.  Pataday, one drop per eye daily as needed.  Essential hypertension, benign  The patient has been made aware of the elevated blood pressure reading and has been encouraged to follow up with his primary care physician in the near future regarding this issue.  Blayze has verbalized understanding and agreed to do so.  Compliance with antihypertensive medications has been emphasized.   Meds ordered this encounter  Medications  . Tiotropium Bromide Monohydrate (SPIRIVA RESPIMAT) 1.25 MCG/ACT AERS    Sig: Inhale 2 puffs into the lungs daily.    Dispense:  4 g    Refill:  5  . montelukast (SINGULAIR) 10 MG tablet    Sig: Take 1 tablet (10 mg total) by mouth at bedtime.    Dispense:  30 tablet    Refill:  5    Diagnostics: Spirometry reveals an FVC of 2.21 L (51% predicted) and an FEV1 of 1.24 L (36% predicted) with significant (500 mL, 40%) postbronchodilator improvement.  Please see scanned spirometry results for details.    Physical examination: Blood pressure (!) 140/92, pulse 100, temperature 98.1 F (36.7 C), temperature source Oral, resp. rate 16, height  (1.803 m), weight 154 lb 12.8 oz (70.2  kg), SpO2 95 %.  General: Alert, interactive, in no acute distress. HEENT: TMs pearly gray, turbinates mildly edematous without discharge, post-pharynx mildly erythematous. Neck: Supple without lymphadenopathy. Lungs: Decreased breath sounds bilaterally without wheezing, rhonchi or rales. CV: Normal S1, S2 without  murmurs. Skin: Warm and dry, without lesions or rashes.  The following portions of the patient's history were reviewed and updated as appropriate: allergies, current medications, past family history, past medical history, past social history, past surgical history and problem list.  Allergies as of 02/14/2018   No Known Allergies     Medication List        Accurate as of 02/14/18  1:47 PM. Always use your most recent med list.          albuterol 108 (90 Base) MCG/ACT inhaler Commonly known as:  PROAIR HFA Inhale 2 puffs into the lungs every 4 (four) hours as needed for wheezing.   albuterol (2.5 MG/3ML) 0.083% nebulizer solution Commonly known as:  PROVENTIL Take 3 mLs (2.5 mg total) by nebulization every 4 (four) hours as needed for wheezing or shortness of breath.   Azelastine HCl 0.15 % Soln Place 2 sprays into both nostrils 2 (two) times daily.   budesonide-formoterol 160-4.5 MCG/ACT inhaler Commonly known as:  SYMBICORT Take 2 puffs first thing in am and then another 2 puffs about 12 hours later.   EPINEPHrine 0.3 mg/0.3 mL Soaj injection Commonly known as:  EPI-PEN   levocetirizine 5 MG tablet Commonly known as:  XYZAL Take 1 tablet (5 mg total) by mouth every evening.   montelukast 10 MG tablet Commonly known as:  SINGULAIR Take 1 tablet (10 mg total) by mouth at bedtime.   montelukast 10 MG tablet Commonly known as:  SINGULAIR Take 1 tablet (10 mg total) by mouth at bedtime.   Olopatadine HCl 0.2 % Soln Commonly known as:  PATADAY Place 1 drop into both eyes daily.   omalizumab 150 MG injection Commonly known as:  XOLAIR Inject 375 mg into the skin every 14 (fourteen) days.   Tiotropium Bromide Monohydrate 1.25 MCG/ACT Aers Commonly known as:  SPIRIVA RESPIMAT Inhale 2 puffs into the lungs daily.   Tiotropium Bromide Monohydrate 1.25 MCG/ACT Aers Commonly known as:  SPIRIVA RESPIMAT Inhale 2 puffs into the lungs daily.       No Known  Allergies  Review of systems: Review of systems negative except as noted in HPI / PMHx or noted below: Constitutional: Negative.  HENT: Negative.   Eyes: Negative.  Respiratory: Negative.   Cardiovascular: Negative.  Gastrointestinal: Negative.  Genitourinary: Negative.  Musculoskeletal: Negative.  Neurological: Negative.  Endo/Heme/Allergies: Negative.  Cutaneous: Negative.  Past Medical History:  Diagnosis Date  . Asthma   . Sarcoidosis   . Sarcoidosis   . Sickle cell anemia (HCC)   . Sickle cell trait (HCC)     Family History  Problem Relation Age of Onset  . Asthma Mother   . Coronary artery disease Mother   . Stroke Mother   . Heart attack Mother   . Sickle cell trait Mother   . Prostate cancer Father   . Cancer Father 87       prostate  . Alcohol abuse Brother   . Early death Brother   . Drug abuse Brother   . Diabetes Neg Hx   . Heart disease Neg Hx   . Hyperlipidemia Neg Hx   . Hypertension Neg Hx   . Kidney disease Neg Hx     Social  History   Socioeconomic History  . Marital status: Married    Spouse name: Not on file  . Number of children: Not on file  . Years of education: Not on file  . Highest education level: Not on file  Occupational History  . Not on file  Social Needs  . Financial resource strain: Not on file  . Food insecurity:    Worry: Not on file    Inability: Not on file  . Transportation needs:    Medical: Not on file    Non-medical: Not on file  Tobacco Use  . Smoking status: Former Smoker    Packs/day: 0.50    Years: 10.00    Pack years: 5.00    Last attempt to quit: 10/04/1993    Years since quitting: 24.3  . Smokeless tobacco: Never Used  Substance and Sexual Activity  . Alcohol use: Yes    Alcohol/week: 28.8 oz    Types: 3 Standard drinks or equivalent, 25 Shots of liquor, 20 Cans of beer per week  . Drug use: No  . Sexual activity: Yes  Lifestyle  . Physical activity:    Days per week: Not on file    Minutes  per session: Not on file  . Stress: Not on file  Relationships  . Social connections:    Talks on phone: Not on file    Gets together: Not on file    Attends religious service: Not on file    Active member of club or organization: Not on file    Attends meetings of clubs or organizations: Not on file    Relationship status: Not on file  . Intimate partner violence:    Fear of current or ex partner: Not on file    Emotionally abused: Not on file    Physically abused: Not on file    Forced sexual activity: Not on file  Other Topics Concern  . Not on file  Social History Narrative  . Not on file     I appreciate the opportunity to take part in Mayan's care. Please do not hesitate to contact me with questions.  Sincerely,   R. Jorene Guest, MD

## 2018-02-14 NOTE — Patient Instructions (Addendum)
Severe persistent asthma Currently with suboptimal control.  Prednisone has been provided, 40 mg x3 days, 20 mg x1 day, 10 mg x1 day, then stop.  Continue omalizumab (Xolair) injections as prescribed.  The importance of compliance has been emphasized.  Symbicort 160-4.5 g, 2 inhalations twice daily.  To maximize pulmonary deposition, a spacer has been provided along with instructions for its proper administration with an HFA inhaler.  Restart Spiriva Respimat, 1.25 g 2 inhalations daily.  A refill prescription has been provided for montelukast 10 mg daily at bedtime.  Continue albuterol every 6 hours if needed.  The patient has been asked to contact me if his symptoms persist or progress. Otherwise, he may return for follow up in 2 months.  Perennial and seasonal allergic rhinitis  For now, continue appropriate allergen avoidance measures, levocetirizine as needed, montelukast daily, azelastine nasal spray as needed, and nasal saline irrigation if needed.  We will consider initiating aeroallergen immunotherapy while using Xolair as a bridge during buildup injections.  However, his asthma will have to be under better control before initiating aeroallergen immunotherapy.  Allergic conjunctivitis  Treatment plan as outlined above for allergic rhinitis.  Pataday, one drop per eye daily as needed.  Essential hypertension, benign  The patient has been made aware of the elevated blood pressure reading and has been encouraged to follow up with his primary care physician in the near future regarding this issue.  [name] has verbalized understanding and agreed to do so.  Compliance with antihypertensive medications has been emphasized.   Return in about 2 months (around 04/16/2018), or if symptoms worsen or fail to improve.

## 2018-02-14 NOTE — Assessment & Plan Note (Signed)
   For now, continue appropriate allergen avoidance measures, levocetirizine as needed, montelukast daily, azelastine nasal spray as needed, and nasal saline irrigation if needed.  We will consider initiating aeroallergen immunotherapy while using Xolair as a bridge during buildup injections.  However, his asthma will have to be under better control before initiating aeroallergen immunotherapy.

## 2018-02-14 NOTE — Assessment & Plan Note (Signed)
   The patient has been made aware of the elevated blood pressure reading and has been encouraged to follow up with his primary care physician in the near future regarding this issue.  Lynda has verbalized understanding and agreed to do so.  Compliance with antihypertensive medications has been emphasized.

## 2018-02-14 NOTE — Assessment & Plan Note (Signed)
   Treatment plan as outlined above for allergic rhinitis.  Pataday, one drop per eye daily as needed. 

## 2018-02-14 NOTE — Assessment & Plan Note (Addendum)
Currently with suboptimal control.  Prednisone has been provided, 40 mg x3 days, 20 mg x1 day, 10 mg x1 day, then stop.  Continue omalizumab (Xolair) injections as prescribed.  The importance of compliance has been emphasized.  Symbicort 160-4.5 g, 2 inhalations twice daily.  To maximize pulmonary deposition, a spacer has been provided along with instructions for its proper administration with an HFA inhaler.  Restart Spiriva Respimat, 1.25 g 2 inhalations daily.  A refill prescription has been provided for montelukast 10 mg daily at bedtime.  Continue albuterol every 6 hours if needed.  The patient has been asked to contact me if his symptoms persist or progress. Otherwise, he may return for follow up in 2 months.

## 2018-02-23 MED FILL — XOLAIR 150 MG SOLR: 150 | 28 days supply | Qty: 6 | Fill #3

## 2018-02-28 ENCOUNTER — Ambulatory Visit (INDEPENDENT_AMBULATORY_CARE_PROVIDER_SITE_OTHER): Payer: 59 | Admitting: *Deleted

## 2018-02-28 DIAGNOSIS — J4550 Severe persistent asthma, uncomplicated: Secondary | ICD-10-CM | POA: Diagnosis not present

## 2018-03-06 ENCOUNTER — Ambulatory Visit: Payer: 59 | Admitting: Allergy and Immunology

## 2018-03-13 ENCOUNTER — Encounter: Payer: Self-pay | Admitting: Internal Medicine

## 2018-03-13 ENCOUNTER — Other Ambulatory Visit (INDEPENDENT_AMBULATORY_CARE_PROVIDER_SITE_OTHER): Payer: 59

## 2018-03-13 ENCOUNTER — Ambulatory Visit (INDEPENDENT_AMBULATORY_CARE_PROVIDER_SITE_OTHER): Payer: 59 | Admitting: Internal Medicine

## 2018-03-13 VITALS — BP 140/80 | HR 97 | Temp 98.4°F | Resp 16 | Ht 71.0 in | Wt 157.2 lb

## 2018-03-13 DIAGNOSIS — Z0000 Encounter for general adult medical examination without abnormal findings: Secondary | ICD-10-CM | POA: Diagnosis not present

## 2018-03-13 DIAGNOSIS — I10 Essential (primary) hypertension: Secondary | ICD-10-CM

## 2018-03-13 LAB — COMPREHENSIVE METABOLIC PANEL
ALT: 27 U/L (ref 0–53)
AST: 26 U/L (ref 0–37)
Albumin: 4.7 g/dL (ref 3.5–5.2)
Alkaline Phosphatase: 59 U/L (ref 39–117)
BUN: 14 mg/dL (ref 6–23)
CHLORIDE: 99 meq/L (ref 96–112)
CO2: 32 meq/L (ref 19–32)
CREATININE: 1.02 mg/dL (ref 0.40–1.50)
Calcium: 9.8 mg/dL (ref 8.4–10.5)
GFR: 99.48 mL/min (ref >60.00)
GLUCOSE: 88 mg/dL (ref 70–99)
Potassium: 4.2 mEq/L (ref 3.5–5.1)
SODIUM: 138 meq/L (ref 135–145)
Total Bilirubin: 0.6 mg/dL (ref 0.2–1.2)
Total Protein: 8.1 g/dL (ref 6.0–8.3)

## 2018-03-13 LAB — LIPID PANEL
CHOL/HDL RATIO: 4
Cholesterol: 198 mg/dL (ref 0–200)
HDL: 55.1 mg/dL (ref >39.00)
LDL CALC: 119 mg/dL — AB (ref 0–99)
NonHDL: 142.8
Triglycerides: 121 mg/dL (ref 0.0–149.0)
VLDL: 24.2 mg/dL (ref 0.0–40.0)

## 2018-03-13 LAB — PSA: PSA: 1.54 ng/mL (ref 0.10–4.00)

## 2018-03-13 NOTE — Patient Instructions (Signed)

## 2018-03-13 NOTE — Progress Notes (Signed)
Subjective:  Patient ID: [name], male    DOB: 06/03/1966  Age: 52 y.o. MRN: [id number]  CC: Annual Exam and Hypertension   HPI [name] presents for a CPX.  He offers no new complaints today.  He still struggles with wheezing and shortness of breath but he is seeing an allergist and is making improvement on his current regimen.  He tells me his blood pressure has been recently well controlled and he denies any episodes of chest pain, diaphoresis, headache, or blurred vision.  Outpatient Medications Prior to Visit  Medication Sig Dispense Refill  . albuterol (PROAIR HFA) 108 (90 Base) MCG/ACT inhaler Inhale 2 puffs into the lungs every 4 (four) hours as needed for wheezing. 1 Inhaler 11  . albuterol (PROVENTIL) (2.5 MG/3ML) 0.083% nebulizer solution Take 3 mLs (2.5 mg total) by nebulization every 4 (four) hours as needed for wheezing or shortness of breath. 75 mL 1  . Azelastine HCl 0.15 % SOLN Place 2 sprays into both nostrils 2 (two) times daily. 30 mL 5  . budesonide-formoterol (SYMBICORT) 160-4.5 MCG/ACT inhaler Take 2 puffs first thing in am and then another 2 puffs about 12 hours later. 1 Inhaler 11  . EPINEPHrine 0.3 mg/0.3 mL IJ SOAJ injection   2  . levocetirizine (XYZAL) 5 MG tablet Take 1 tablet (5 mg total) by mouth every evening. 30 tablet 5  . montelukast (SINGULAIR) 10 MG tablet Take 1 tablet (10 mg total) by mouth at bedtime. 30 tablet 5  . Olopatadine HCl (PATADAY) 0.2 % SOLN Place 1 drop into both eyes daily. 1 Bottle 5  . omalizumab (XOLAIR) 150 MG injection Inject 375 mg into the skin every 14 (fourteen) days. 6 each 10  . Tiotropium Bromide Monohydrate (SPIRIVA RESPIMAT) 1.25 MCG/ACT AERS Inhale 2 puffs into the lungs daily. 4 g 5  . montelukast (SINGULAIR) 10 MG tablet Take 1 tablet (10 mg total) by mouth at bedtime. 30 tablet 5  . Tiotropium Bromide Monohydrate (SPIRIVA RESPIMAT) 1.25 MCG/ACT AERS Inhale 2 puffs into the lungs daily. 4 g 3    Facility-Administered Medications Prior to Visit  Medication Dose Route Frequency Provider Last Rate Last Dose  . omalizumab Geoffry Paradise) injection 375 mg  375 mg Subcutaneous Q14 Days Bobbitt, Heywood Iles, MD   375 mg at 02/28/18 1904  . methylPREDNISolone acetate (DEPO-MEDROL) injection 80 mg  80 mg Intramuscular Once Bobbitt, Heywood Iles, MD        ROS Review of Systems  Constitutional: Negative.  Negative for chills, diaphoresis and fatigue.  HENT: Negative.  Negative for sore throat and trouble swallowing.   Eyes: Negative for visual disturbance.  Respiratory: Negative for cough, chest tightness, shortness of breath and wheezing.   Cardiovascular: Negative for chest pain, palpitations and leg swelling.  Gastrointestinal: Negative for abdominal pain, constipation, diarrhea, nausea and vomiting.  Endocrine: Negative.   Genitourinary: Negative.  Negative for difficulty urinating, dysuria, penile swelling, scrotal swelling and testicular pain.  Musculoskeletal: Negative.  Negative for arthralgias and myalgias.  Skin: Negative.  Negative for color change and pallor.  Allergic/Immunologic: Negative.   Neurological: Negative.  Negative for dizziness, weakness, light-headedness and headaches.  Hematological: Negative for adenopathy. Does not bruise/bleed easily.  Psychiatric/Behavioral: Negative.     Objective:  BP 140/80 (BP Location: Left Arm, Patient Position: Sitting, Cuff Size: Normal)   Pulse 97   Temp 98.4 F (36.9 C) (Oral)   Resp 16   Ht 5\' 11"  (1.803 m)   Wt 157 lb  4 oz (71.3 kg)   SpO2 98%   BMI 21.93 kg/m   BP Readings from Last 3 Encounters:  03/13/18 140/80  02/14/18 (!) 140/92  11/07/17 122/78    Wt Readings from Last 3 Encounters:  03/13/18 157 lb 4 oz (71.3 kg)  02/14/18 154 lb 12.8 oz (70.2 kg)  11/07/17 151 lb (68.5 kg)    Physical Exam  Constitutional: He is oriented to person, place, and time. No distress.  HENT:  Mouth/Throat: Oropharynx is  clear and moist. No oropharyngeal exudate.  Eyes: Conjunctivae are normal. No scleral icterus.  Neck: Normal range of motion. Neck supple. No JVD present. No thyromegaly present.  Cardiovascular: Normal rate, regular rhythm and normal heart sounds. Exam reveals no gallop.  No murmur heard. Pulmonary/Chest: Effort normal and breath sounds normal. No respiratory distress. He has no wheezes. He has no rales.  Abdominal: Soft. Bowel sounds are normal. He exhibits no mass. There is no hepatosplenomegaly. There is no tenderness. Hernia confirmed negative in the right inguinal area and confirmed negative in the left inguinal area.  Genitourinary: Rectum normal, testes normal and penis normal. Rectal exam shows no external hemorrhoid, no internal hemorrhoid, no fissure, no mass, no tenderness, anal tone normal and guaiac negative stool. Prostate is enlarged (1+ smooth symm BPH). Prostate is not tender. Right testis shows no mass, no swelling and no tenderness. Left testis shows no swelling and no tenderness. Circumcised. No penile erythema or penile tenderness. No discharge found.  Musculoskeletal: He exhibits no edema, tenderness or deformity.  Lymphadenopathy:    He has no cervical adenopathy. No inguinal adenopathy noted on the right or left side.  Neurological: He is alert and oriented to person, place, and time.  Skin: Skin is warm and dry. He is not diaphoretic.  Vitals reviewed.   Lab Results  Component Value Date   WBC 4.8 10/10/2017   HGB 16.6 10/10/2017   HCT 46.4 10/10/2017   PLT 253 10/10/2017   GLUCOSE 88 03/13/2018   CHOL 198 03/13/2018   TRIG 121.0 03/13/2018   HDL 55.10 03/13/2018   LDLCALC 119 (H) 03/13/2018   ALT 27 03/13/2018   AST 26 03/13/2018   NA 138 03/13/2018   K 4.2 03/13/2018   CL 99 03/13/2018   CREATININE 1.02 03/13/2018   BUN 14 03/13/2018   CO2 32 03/13/2018   TSH 0.94 12/31/2015   PSA 1.54 03/13/2018    Dg Chest 2 View  Result Date: 08/12/2017 CLINICAL  DATA:  Dyspnea for 3 months.  History of sarcoidosis. EXAM: CHEST  2 VIEW COMPARISON:  PA and lateral chest 03/17/2016 and 12/16/2015. FINDINGS: Prominence of the hila bilaterally consistent with lymphadenopathy is unchanged. Lungs are clear. Heart size is normal. No pneumothorax or pleural effusion. No acute bony abnormality. IMPRESSION: No acute disease. No change in bilateral hilar lymphadenopathy. Electronically Signed   By: Drusilla Kannerhomas  Dalessio M.D.   On: 08/12/2017 16:39    Assessment & Plan:   [name] was seen today for annual exam and hypertension.  Diagnoses and all orders for this visit:  Essential hypertension, benign- His blood pressure is adequately well controlled.  Lab work is negative for secondary causes or endorgan damage.  Medical therapy is not indicated. -     Comprehensive metabolic panel; Future  Routine general medical examination at a health care facility- Exam completed, labs reviewed, vaccines reviewed, patient education material was given. -     Lipid panel; Future -     PSA; Future  I am having [name] maintain his albuterol, budesonide-formoterol, albuterol, levocetirizine, Azelastine HCl, Olopatadine HCl, omalizumab, EPINEPHrine, Tiotropium Bromide Monohydrate, and montelukast. We will stop administering methylPREDNISolone acetate. Additionally, we will continue to administer omalizumab.  No orders of the defined types were placed in this encounter.    Follow-up: Return if symptoms worsen or fail to improve.  Sanda Linger, MD

## 2018-03-14 ENCOUNTER — Ambulatory Visit (INDEPENDENT_AMBULATORY_CARE_PROVIDER_SITE_OTHER): Payer: 59 | Admitting: *Deleted

## 2018-03-14 DIAGNOSIS — J4550 Severe persistent asthma, uncomplicated: Secondary | ICD-10-CM

## 2018-03-22 MED FILL — XOLAIR 150 MG SOLR: 150 | 28 days supply | Qty: 6 | Fill #4

## 2018-03-28 ENCOUNTER — Ambulatory Visit (INDEPENDENT_AMBULATORY_CARE_PROVIDER_SITE_OTHER): Payer: 59 | Admitting: *Deleted

## 2018-03-28 DIAGNOSIS — J4550 Severe persistent asthma, uncomplicated: Secondary | ICD-10-CM

## 2018-04-11 ENCOUNTER — Ambulatory Visit (INDEPENDENT_AMBULATORY_CARE_PROVIDER_SITE_OTHER): Payer: 59 | Admitting: *Deleted

## 2018-04-11 DIAGNOSIS — J4550 Severe persistent asthma, uncomplicated: Secondary | ICD-10-CM

## 2018-04-18 ENCOUNTER — Ambulatory Visit: Payer: 59 | Admitting: Allergy and Immunology

## 2018-04-19 MED FILL — XOLAIR 150 MG SOLR: 150 | 28 days supply | Qty: 6 | Fill #5

## 2018-04-20 ENCOUNTER — Encounter: Payer: Self-pay | Admitting: Internal Medicine

## 2018-04-20 ENCOUNTER — Ambulatory Visit (INDEPENDENT_AMBULATORY_CARE_PROVIDER_SITE_OTHER): Payer: 59 | Admitting: Internal Medicine

## 2018-04-20 ENCOUNTER — Telehealth: Payer: Self-pay | Admitting: Internal Medicine

## 2018-04-20 VITALS — BP 130/84 | HR 105 | Temp 98.1°F | Ht 71.0 in | Wt 155.5 lb

## 2018-04-20 DIAGNOSIS — M5416 Radiculopathy, lumbar region: Secondary | ICD-10-CM | POA: Diagnosis not present

## 2018-04-20 DIAGNOSIS — M51369 Other intervertebral disc degeneration, lumbar region without mention of lumbar back pain or lower extremity pain: Secondary | ICD-10-CM

## 2018-04-20 DIAGNOSIS — M5136 Other intervertebral disc degeneration, lumbar region: Secondary | ICD-10-CM | POA: Diagnosis not present

## 2018-04-20 MED ORDER — METHYLPREDNISOLONE 4 MG PO TBPK: ORAL_TABLET | ORAL | 0 refills | Status: DC

## 2018-04-20 MED ORDER — IBUPROFEN 600 MG PO TABS: 600.0000 mg | ORAL_TABLET | Freq: Three times a day (TID) | ORAL | 1 refills | Status: DC | PRN

## 2018-04-20 MED FILL — METHYLPREDNISOLONE 4 MG TAB: 4 | 6 days supply | Qty: 21 | Fill #0

## 2018-04-20 MED FILL — IBUPROFEN 600 MG TABLET: 600 | 30 days supply | Qty: 90 | Fill #0

## 2018-04-20 MED FILL — SYMBICORT 160-4.5 MCG INH: 160-4.5 | 30 days supply | Qty: 10 | Fill #1

## 2018-04-20 NOTE — Progress Notes (Signed)
Subjective:  Patient ID: [name], male    DOB: Feb 08, 1966  Age: 52 y.o. MRN: [id number]  CC: Back Pain   HPI [name] presents for a 3 week history of low back pain that occasionally radiates into his lower extremities, more so on the right than the left.  He has had intermittent episodes of back pain for about 2 years.  He denies any recent trauma or injury.  He denies paresthesias in his lower extremities.  He does not feel like he can go to work at this time.  He is not currently taking anything to control the pain.  Outpatient Medications Prior to Visit  Medication Sig Dispense Refill  . albuterol (PROAIR HFA) 108 (90 Base) MCG/ACT inhaler Inhale 2 puffs into the lungs every 4 (four) hours as needed for wheezing. 1 Inhaler 11  . albuterol (PROVENTIL) (2.5 MG/3ML) 0.083% nebulizer solution Take 3 mLs (2.5 mg total) by nebulization every 4 (four) hours as needed for wheezing or shortness of breath. 75 mL 1  . Azelastine HCl 0.15 % SOLN Place 2 sprays into both nostrils 2 (two) times daily. 30 mL 5  . budesonide-formoterol (SYMBICORT) 160-4.5 MCG/ACT inhaler Take 2 puffs first thing in am and then another 2 puffs about 12 hours later. 1 Inhaler 11  . EPINEPHrine 0.3 mg/0.3 mL IJ SOAJ injection   2  . levocetirizine (XYZAL) 5 MG tablet Take 1 tablet (5 mg total) by mouth every evening. 30 tablet 5  . montelukast (SINGULAIR) 10 MG tablet Take 1 tablet (10 mg total) by mouth at bedtime. 30 tablet 5  . Olopatadine HCl (PATADAY) 0.2 % SOLN Place 1 drop into both eyes daily. 1 Bottle 5  . omalizumab (XOLAIR) 150 MG injection Inject 375 mg into the skin every 14 (fourteen) days. 6 each 10  . Tiotropium Bromide Monohydrate (SPIRIVA RESPIMAT) 1.25 MCG/ACT AERS Inhale 2 puffs into the lungs daily. (Patient not taking: Reported on 04/20/2018) 4 g 5  . omalizumab (XOLAIR) injection 375 mg      No facility-administered medications prior to visit.     ROS Review of Systems    Constitutional: Negative for chills, fatigue and fever.  HENT: Negative.   Eyes: Negative for visual disturbance.  Respiratory: Negative for cough, chest tightness and wheezing.   Cardiovascular: Negative for chest pain, palpitations and leg swelling.  Gastrointestinal: Negative for abdominal pain, constipation, diarrhea, nausea and vomiting.  Endocrine: Negative.   Genitourinary: Negative.  Negative for difficulty urinating.  Musculoskeletal: Positive for back pain. Negative for arthralgias, myalgias and neck pain.  Skin: Negative.   Neurological: Negative.  Negative for weakness and numbness.  Hematological: Negative for adenopathy. Does not bruise/bleed easily.  Psychiatric/Behavioral: Negative.     Objective:  BP 130/84 (BP Location: Left Arm, Patient Position: Sitting, Cuff Size: Normal)   Pulse (!) 105   Temp 98.1 F (36.7 C) (Oral)   Ht 5\' 11"  (1.803 m)   Wt 155 lb 8 oz (70.5 kg)   SpO2 98%   BMI 21.69 kg/m   BP Readings from Last 3 Encounters:  04/20/18 130/84  03/13/18 140/80  02/14/18 (!) 140/92    Wt Readings from Last 3 Encounters:  04/20/18 155 lb 8 oz (70.5 kg)  03/13/18 157 lb 4 oz (71.3 kg)  02/14/18 154 lb 12.8 oz (70.2 kg)    Physical Exam  Constitutional: He is oriented to person, place, and time. No distress.  HENT:  Mouth/Throat: Oropharynx is clear and moist. No  oropharyngeal exudate.  Eyes: Conjunctivae are normal.  Neck: Normal range of motion. Neck supple. No JVD present. No thyromegaly present.  Cardiovascular: Normal rate, regular rhythm and normal heart sounds.  Pulmonary/Chest: Effort normal and breath sounds normal. He has no wheezes. He has no rales.  Abdominal: Soft. Bowel sounds are normal. He exhibits no mass. There is no tenderness.  Musculoskeletal: Normal range of motion. He exhibits no edema, tenderness or deformity.       Lumbar back: Normal. He exhibits normal range of motion, no tenderness, no bony tenderness, no edema, no  deformity, no pain and no spasm.  Neurological: He is alert and oriented to person, place, and time. He has normal strength. He displays no atrophy and no tremor. No cranial nerve deficit or sensory deficit. He exhibits normal muscle tone. He displays a negative Romberg sign. He displays no seizure activity. Coordination and gait normal.  Reflex Scores:      Bicep reflexes are 1+ on the right side and 1+ on the left side.      Brachioradialis reflexes are 0 on the right side and 0 on the left side.      Patellar reflexes are 1+ on the right side and 1+ on the left side.      Achilles reflexes are 0 on the right side and 0 on the left side. NEG SLR in BLE  Skin: Skin is warm and dry. No rash noted. He is not diaphoretic.  Vitals reviewed.   Lab Results  Component Value Date   WBC 4.8 10/10/2017   HGB 16.6 10/10/2017   HCT 46.4 10/10/2017   PLT 253 10/10/2017   GLUCOSE 88 03/13/2018   CHOL 198 03/13/2018   TRIG 121.0 03/13/2018   HDL 55.10 03/13/2018   LDLCALC 119 (H) 03/13/2018   ALT 27 03/13/2018   AST 26 03/13/2018   NA 138 03/13/2018   K 4.2 03/13/2018   CL 99 03/13/2018   CREATININE 1.02 03/13/2018   BUN 14 03/13/2018   CO2 32 03/13/2018   TSH 0.94 12/31/2015   PSA 1.54 03/13/2018    Dg Chest 2 View  Result Date: 08/12/2017 CLINICAL DATA:  Dyspnea for 3 months.  History of sarcoidosis. EXAM: CHEST  2 VIEW COMPARISON:  PA and lateral chest 03/17/2016 and 12/16/2015. FINDINGS: Prominence of the hila bilaterally consistent with lymphadenopathy is unchanged. Lungs are clear. Heart size is normal. No pneumothorax or pleural effusion. No acute bony abnormality. IMPRESSION: No acute disease. No change in bilateral hilar lymphadenopathy. Electronically Signed   By: Drusilla Kannerhomas  Dalessio M.D.   On: 08/12/2017 16:39    Assessment & Plan:   [name] was seen today for back pain.  Diagnoses and all orders for this visit:  Left lumbar radiculitis- he has low back pain that radiates into  his lower extremities but he is neurologically intact.  I do not think an imaging study is needed at this time.  Will treat the pain and inflammation with a Medrol DosePak and ibuprofen as needed. -     methylPREDNISolone (MEDROL DOSEPAK) 4 MG TBPK tablet; TAKE AS DIRECTED -     ibuprofen (ADVIL,MOTRIN) 600 MG tablet; Take 1 tablet (600 mg total) by mouth every 8 (eight) hours as needed.  Degenerative disc disease, lumbar- as above -     methylPREDNISolone (MEDROL DOSEPAK) 4 MG TBPK tablet; TAKE AS DIRECTED -     ibuprofen (ADVIL,MOTRIN) 600 MG tablet; Take 1 tablet (600 mg total) by mouth every 8 (eight)  hours as needed.   I am having [name] start on methylPREDNISolone and ibuprofen. I am also having him maintain his albuterol, budesonide-formoterol, albuterol, levocetirizine, Azelastine HCl, Olopatadine HCl, omalizumab, EPINEPHrine, Tiotropium Bromide Monohydrate, and montelukast. We will stop administering omalizumab.  Meds ordered this encounter  Medications  . methylPREDNISolone (MEDROL DOSEPAK) 4 MG TBPK tablet    Sig: TAKE AS DIRECTED    Dispense:  21 tablet    Refill:  0  . ibuprofen (ADVIL,MOTRIN) 600 MG tablet    Sig: Take 1 tablet (600 mg total) by mouth every 8 (eight) hours as needed.    Dispense:  90 tablet    Refill:  1     Follow-up: Return in about 1 month (around 05/18/2018).  Sanda Linger, MD

## 2018-04-20 NOTE — Patient Instructions (Signed)

## 2018-04-20 NOTE — Telephone Encounter (Signed)
Patient was seen in office today and requested his FMLA to be renewed for another 6 months. To just make the changes to the old forms and send them back.    I called [employer] and spoke with Marylene Landngela about these forms. She stated she had already received approval from us, and that he is renewed and update.

## 2018-04-24 DIAGNOSIS — Z0279 Encounter for issue of other medical certificate: Secondary | ICD-10-CM

## 2018-04-24 NOTE — Telephone Encounter (Signed)
[employer] did end up sending over new forms to be completed. Forms have been completed and placed in providers box to review and sign.

## 2018-04-24 NOTE — Telephone Encounter (Signed)
Forms have ben signed, faxed to [employer] @ [phone], Copy sent to scan & charged for.  Original has been mailed to patient, tried to call and informed him - No VM picked up.

## 2018-04-25 ENCOUNTER — Ambulatory Visit: Payer: 59 | Admitting: Allergy and Immunology

## 2018-04-25 ENCOUNTER — Ambulatory Visit (INDEPENDENT_AMBULATORY_CARE_PROVIDER_SITE_OTHER): Payer: 59 | Admitting: *Deleted

## 2018-04-25 DIAGNOSIS — J4550 Severe persistent asthma, uncomplicated: Secondary | ICD-10-CM

## 2018-04-25 MED ORDER — OMALIZUMAB 150 MG ~~LOC~~ SOLR
375.0000 mg | SUBCUTANEOUS | Status: DC
  Administered 2018-04-25 – 2019-03-15 (×23): 375 mg via SUBCUTANEOUS

## 2018-05-09 ENCOUNTER — Ambulatory Visit (INDEPENDENT_AMBULATORY_CARE_PROVIDER_SITE_OTHER): Payer: 59 | Admitting: *Deleted

## 2018-05-09 DIAGNOSIS — J4550 Severe persistent asthma, uncomplicated: Secondary | ICD-10-CM

## 2018-05-17 MED FILL — XOLAIR 150 MG SOLR: 150 | 28 days supply | Qty: 6 | Fill #6

## 2018-05-23 ENCOUNTER — Ambulatory Visit (INDEPENDENT_AMBULATORY_CARE_PROVIDER_SITE_OTHER): Payer: 59 | Admitting: *Deleted

## 2018-05-23 DIAGNOSIS — J4550 Severe persistent asthma, uncomplicated: Secondary | ICD-10-CM

## 2018-06-06 ENCOUNTER — Ambulatory Visit (INDEPENDENT_AMBULATORY_CARE_PROVIDER_SITE_OTHER): Payer: 59

## 2018-06-06 DIAGNOSIS — J4550 Severe persistent asthma, uncomplicated: Secondary | ICD-10-CM

## 2018-06-13 MED FILL — XOLAIR 150 MG SOLR: 150 | 28 days supply | Qty: 6 | Fill #7

## 2018-06-20 ENCOUNTER — Ambulatory Visit (INDEPENDENT_AMBULATORY_CARE_PROVIDER_SITE_OTHER): Payer: 59 | Admitting: *Deleted

## 2018-06-20 DIAGNOSIS — J4550 Severe persistent asthma, uncomplicated: Secondary | ICD-10-CM

## 2018-06-21 ENCOUNTER — Ambulatory Visit (INDEPENDENT_AMBULATORY_CARE_PROVIDER_SITE_OTHER): Payer: 59 | Admitting: Internal Medicine

## 2018-06-21 ENCOUNTER — Encounter: Payer: Self-pay | Admitting: Internal Medicine

## 2018-06-21 VITALS — BP 118/90 | HR 71 | Temp 97.7°F | Resp 16 | Ht 71.0 in | Wt 156.8 lb

## 2018-06-21 DIAGNOSIS — M5136 Other intervertebral disc degeneration, lumbar region: Secondary | ICD-10-CM

## 2018-06-21 DIAGNOSIS — M5416 Radiculopathy, lumbar region: Secondary | ICD-10-CM | POA: Diagnosis not present

## 2018-06-21 MED ORDER — HYDROCODONE-ACETAMINOPHEN 5-325 MG PO TABS: 1.0000 | ORAL_TABLET | Freq: Four times a day (QID) | ORAL | 0 refills | Status: DC | PRN

## 2018-06-21 MED FILL — HYDROCODON-APAP 5-325: 5-325 | 9 days supply | Qty: 35 | Fill #0

## 2018-06-21 NOTE — Progress Notes (Signed)
Subjective:  Patient ID: [name], male    DOB: 08-18-1966  Age: 52 y.o. MRN: [id number]  CC: Back Pain   HPI Trevyn Lumpkin presents for a one-month history of right sided lower back pain that radiates into his right lower extremity.  He denies any trauma or injury.  He has tried to control the pain with ibuprofen but is not getting much symptom relief.  He says the pain interferes with his daily activities as well as interferes with his sleep.  He underwent a lumbar procedure with pain management a year ago to treat low back pain.  Outpatient Medications Prior to Visit  Medication Sig Dispense Refill  . albuterol (PROAIR HFA) 108 (90 Base) MCG/ACT inhaler Inhale 2 puffs into the lungs every 4 (four) hours as needed for wheezing. 1 Inhaler 11  . albuterol (PROVENTIL) (2.5 MG/3ML) 0.083% nebulizer solution Take 3 mLs (2.5 mg total) by nebulization every 4 (four) hours as needed for wheezing or shortness of breath. 75 mL 1  . Azelastine HCl 0.15 % SOLN Place 2 sprays into both nostrils 2 (two) times daily. 30 mL 5  . budesonide-formoterol (SYMBICORT) 160-4.5 MCG/ACT inhaler Take 2 puffs first thing in am and then another 2 puffs about 12 hours later. 1 Inhaler 11  . EPINEPHrine 0.3 mg/0.3 mL IJ SOAJ injection   2  . ibuprofen (ADVIL,MOTRIN) 600 MG tablet Take 1 tablet (600 mg total) by mouth every 8 (eight) hours as needed. 90 tablet 1  . levocetirizine (XYZAL) 5 MG tablet Take 1 tablet (5 mg total) by mouth every evening. 30 tablet 5  . montelukast (SINGULAIR) 10 MG tablet Take 1 tablet (10 mg total) by mouth at bedtime. 30 tablet 5  . Olopatadine HCl (PATADAY) 0.2 % SOLN Place 1 drop into both eyes daily. 1 Bottle 5  . omalizumab (XOLAIR) 150 MG injection Inject 375 mg into the skin every 14 (fourteen) days. 6 each 10  . Tiotropium Bromide Monohydrate (SPIRIVA RESPIMAT) 1.25 MCG/ACT AERS Inhale 2 puffs into the lungs daily. 4 g 5  . methylPREDNISolone (MEDROL DOSEPAK) 4 MG TBPK tablet  TAKE AS DIRECTED 21 tablet 0   Facility-Administered Medications Prior to Visit  Medication Dose Route Frequency Provider Last Rate Last Dose  . omalizumab Geoffry Paradise) injection 375 mg  375 mg Subcutaneous Q14 Days Bobbitt, Heywood Iles, MD   375 mg at 06/20/18 1225    ROS Review of Systems  Constitutional: Negative for chills, fatigue and fever.  HENT: Negative.   Eyes: Negative for visual disturbance.  Respiratory: Negative for cough, chest tightness, shortness of breath and wheezing.   Cardiovascular: Negative for chest pain, palpitations and leg swelling.  Gastrointestinal: Negative for abdominal pain, constipation, diarrhea, nausea and vomiting.  Endocrine: Negative.   Genitourinary: Negative for difficulty urinating.  Musculoskeletal: Positive for back pain. Negative for myalgias.  Skin: Negative.  Negative for color change and rash.  Neurological: Negative.  Negative for dizziness, weakness and light-headedness.  Hematological: Negative for adenopathy. Does not bruise/bleed easily.  Psychiatric/Behavioral: Negative.     Objective:  BP 118/90 (BP Location: Left Arm, Patient Position: Sitting, Cuff Size: Normal)   Pulse 71   Temp 97.7 F (36.5 C) (Oral)   Resp 16   Ht 5\' 11"  (1.803 m)   Wt 156 lb 12 oz (71.1 kg)   SpO2 97%   BMI 21.86 kg/m   BP Readings from Last 3 Encounters:  06/21/18 118/90  04/20/18 130/84  03/13/18 140/80  Wt Readings from Last 3 Encounters:  06/21/18 156 lb 12 oz (71.1 kg)  04/20/18 155 lb 8 oz (70.5 kg)  03/13/18 157 lb 4 oz (71.3 kg)    Physical Exam  Constitutional: He appears distressed.  HENT:  Mouth/Throat: Oropharynx is clear and moist. No oropharyngeal exudate.  Eyes: Conjunctivae are normal. No scleral icterus.  Neck: Normal range of motion. Neck supple. No JVD present. No thyromegaly present.  Cardiovascular: Normal rate, regular rhythm and normal heart sounds. Exam reveals no gallop and no friction rub.  No murmur  heard. Pulmonary/Chest: Effort normal and breath sounds normal. No respiratory distress. He has no wheezes. He has no rales.  Abdominal: Soft. Bowel sounds are normal. He exhibits no mass. There is no hepatosplenomegaly. There is no tenderness.  Musculoskeletal: Normal range of motion. He exhibits no edema, tenderness or deformity.  Lymphadenopathy:    He has no cervical adenopathy.  Neurological: He has normal strength. He displays no atrophy, no tremor and normal reflexes. No cranial nerve deficit or sensory deficit. He exhibits normal muscle tone. He displays a negative Romberg sign. He displays no seizure activity. Coordination and gait normal.  Reflex Scores:      Tricep reflexes are 1+ on the right side and 1+ on the left side.      Bicep reflexes are 1+ on the right side and 1+ on the left side.      Brachioradialis reflexes are 1+ on the right side and 1+ on the left side.      Patellar reflexes are 1+ on the right side and 1+ on the left side.      Achilles reflexes are 0 on the right side and 0 on the left side. + SLR in RLE - SLR in LLE  Skin: Skin is warm and dry. No rash noted. He is not diaphoretic.  Vitals reviewed.   Lab Results  Component Value Date   WBC 4.8 10/10/2017   HGB 16.6 10/10/2017   HCT 46.4 10/10/2017   PLT 253 10/10/2017   GLUCOSE 88 03/13/2018   CHOL 198 03/13/2018   TRIG 121.0 03/13/2018   HDL 55.10 03/13/2018   LDLCALC 119 (H) 03/13/2018   ALT 27 03/13/2018   AST 26 03/13/2018   NA 138 03/13/2018   K 4.2 03/13/2018   CL 99 03/13/2018   CREATININE 1.02 03/13/2018   BUN 14 03/13/2018   CO2 32 03/13/2018   TSH 0.94 12/31/2015   PSA 1.54 03/13/2018    Dg Chest 2 View  Result Date: 08/12/2017 CLINICAL DATA:  Dyspnea for 3 months.  History of sarcoidosis. EXAM: CHEST  2 VIEW COMPARISON:  PA and lateral chest 03/17/2016 and 12/16/2015. FINDINGS: Prominence of the hila bilaterally consistent with lymphadenopathy is unchanged. Lungs are clear. Heart  size is normal. No pneumothorax or pleural effusion. No acute bony abnormality. IMPRESSION: No acute disease. No change in bilateral hilar lymphadenopathy. Electronically Signed   By: Drusilla Kanner M.D.   On: 08/12/2017 16:39    Assessment & Plan:   Aceyn was seen today for back pain.  Diagnoses and all orders for this visit:  Right lumbar radiculitis- He has a one-month history of radiating low back pain.  Today his neurologic exam was remarkable only for a positive straight leg raise but he is otherwise neurologically intact.  He is not getting adequate relief from the pain with Motrin so will add hydrocodone and acetaminophen as needed.  I have asked him to follow-up with his  pain specialist. -     Ambulatory referral to Orthopedic Surgery -     HYDROcodone-acetaminophen (NORCO/VICODIN) 5-325 MG tablet; Take 1 tablet by mouth every 6 (six) hours as needed for moderate pain.  Degenerative disc disease, lumbar- As above -     Ambulatory referral to Orthopedic Surgery -     HYDROcodone-acetaminophen (NORCO/VICODIN) 5-325 MG tablet; Take 1 tablet by mouth every 6 (six) hours as needed for moderate pain.   I have discontinued Burl Sauber's methylPREDNISolone. I am also having him start on HYDROcodone-acetaminophen. Additionally, I am having him maintain his albuterol, budesonide-formoterol, albuterol, levocetirizine, Azelastine HCl, Olopatadine HCl, omalizumab, EPINEPHrine, Tiotropium Bromide Monohydrate, montelukast, and ibuprofen. We will continue to administer omalizumab.  Meds ordered this encounter  Medications  . HYDROcodone-acetaminophen (NORCO/VICODIN) 5-325 MG tablet    Sig: Take 1 tablet by mouth every 6 (six) hours as needed for moderate pain.    Dispense:  35 tablet    Refill:  0     Follow-up: Return if symptoms worsen or fail to improve.  Sanda Lingerhomas Jones, MD

## 2018-06-21 NOTE — Patient Instructions (Signed)

## 2018-06-27 ENCOUNTER — Ambulatory Visit: Payer: 59 | Admitting: Allergy and Immunology

## 2018-07-04 ENCOUNTER — Encounter: Payer: Self-pay | Admitting: Allergy and Immunology

## 2018-07-04 ENCOUNTER — Ambulatory Visit (INDEPENDENT_AMBULATORY_CARE_PROVIDER_SITE_OTHER): Payer: 59 | Admitting: Allergy and Immunology

## 2018-07-04 ENCOUNTER — Ambulatory Visit: Payer: 59

## 2018-07-04 VITALS — BP 116/80 | HR 87 | Temp 98.3°F | Resp 20 | Ht 69.1 in | Wt 157.0 lb

## 2018-07-04 DIAGNOSIS — J4550 Severe persistent asthma, uncomplicated: Secondary | ICD-10-CM | POA: Diagnosis not present

## 2018-07-04 DIAGNOSIS — J3089 Other allergic rhinitis: Secondary | ICD-10-CM

## 2018-07-04 DIAGNOSIS — H1013 Acute atopic conjunctivitis, bilateral: Secondary | ICD-10-CM

## 2018-07-04 MED ORDER — MONTELUKAST SODIUM 10 MG PO TABS: 10.0000 mg | ORAL_TABLET | Freq: Every day | ORAL | 5 refills | Status: DC

## 2018-07-04 MED ORDER — TIOTROPIUM BROMIDE MONOHYDRATE 1.25 MCG/ACT IN AERS: 2.0000 | INHALATION_SPRAY | Freq: Every day | RESPIRATORY_TRACT | 5 refills | Status: DC

## 2018-07-04 MED ORDER — BUDESONIDE-FORMOTEROL FUMARATE 160-4.5 MCG/ACT IN AERO: INHALATION_SPRAY | RESPIRATORY_TRACT | 5 refills | Status: DC

## 2018-07-04 MED ORDER — ALBUTEROL SULFATE 108 (90 BASE) MCG/ACT IN AEPB: 2.0000 | INHALATION_SPRAY | Freq: Four times a day (QID) | RESPIRATORY_TRACT | 1 refills | Status: DC | PRN

## 2018-07-04 NOTE — Progress Notes (Signed)
Follow-up Note  RE: [name] MRN: [id number] DOB: 1966-09-04 Date of Office Visit: 07/04/2018  Primary care provider: Etta Grandchild, MD Referring provider: Etta Grandchild, MD  History of present illness: [name] is a 52 y.o. male with severe persistent asthma on Xolair injections and allergic rhinoconjunctivitis presenting today for follow-up.  He was last seen in this clinic on Feb 14, 2018.  He reports that in the interval since his previous appointment his asthma has been well controlled and that "Xolair is really helping out a lot."  He reports that he has been only requiring albuterol rescue with vigorous exertion, typically work.  While he was on vacation he rarely required albuterol rescue.  He denies nocturnal awakenings due to lower respiratory symptoms.  He needs refills on his asthma medications.  He is requiring multiple medications in an attempt to control his nasal and ocular allergy symptoms.  The patient is interested in the possibility of starting aeroallergen immunotherapy to reduce symptoms and decrease medication requirement.  Assessment and plan: Severe persistent asthma Stable.  Continue omalizumab (Xolair) injections as prescribed, Symbicort 160-4.5 g, 2 inhalations via spacer device twice daily, Spiriva Respimat, 1.25 g 2 inhalations daily, and montelukast 10 mg daily at bedtime, and albuterol every 6 hours if needed.  Refill prescriptions have been provided.  Subjective and objective measures of pulmonary function will be followed and the treatment plan will be adjusted accordingly.  Perennial and seasonal allergic rhinitis  For now, continue appropriate allergen avoidance measures, levocetirizine as needed, montelukast daily, azelastine nasal spray as needed, and nasal saline irrigation if needed.  The risks and benefits of aeroallergen immunotherapy have been discussed. The patient is motivated to initiate immunotherapy if insurance coverage  is favorable. He will let us know how he would like to proceed.   Meds ordered this encounter  Medications  . montelukast (SINGULAIR) 10 MG tablet    Sig: Take 1 tablet (10 mg total) by mouth at bedtime.    Dispense:  30 tablet    Refill:  5  . Albuterol Sulfate (PROAIR RESPICLICK) 108 (90 Base) MCG/ACT AEPB    Sig: Inhale 2 Doses into the lungs every 6 (six) hours as needed. For cough or wheeze    Dispense:  1 each    Refill:  1  . Tiotropium Bromide Monohydrate (SPIRIVA RESPIMAT) 1.25 MCG/ACT AERS    Sig: Inhale 2 puffs into the lungs daily.    Dispense:  4 g    Refill:  5  . budesonide-formoterol (SYMBICORT) 160-4.5 MCG/ACT inhaler    Sig: Take 2 puffs first thing in am and then another 2 puffs about 12 hours later.    Dispense:  1 Inhaler    Refill:  5    Diagnostics: Spirometry reveals an FVC of 3.00 L (74% predicted) and an FEV1 of 1.84 L (52% predicted) with an FEV1 ratio of 76%.  The FEV1 is improved today compared with previous study.  Please see scanned spirometry results for details.    Physical examination: Blood pressure 116/80, pulse 87, temperature 98.3 F (36.8 C), temperature source Oral, resp. rate 20, height 5' 9.1" (1.755 m), weight 157 lb (71.2 kg), SpO2 95 %.  General: Alert, interactive, in no acute distress. HEENT: TMs pearly gray, turbinates moderately edematous without discharge, post-pharynx mildly erythematous. Neck: Supple without lymphadenopathy. Lungs: Mildly decreased breath sounds bilaterally without wheezing, rhonchi or rales. CV: Normal S1, S2 without murmurs. Skin: Warm and dry, without lesions or rashes.  The following portions of the patient's history were reviewed and updated as appropriate: allergies, current medications, past family history, past medical history, past social history, past surgical history and problem list.  Allergies as of 07/04/2018   No Known Allergies     Medication List        Accurate as of 07/04/18  6:25 PM.  Always use your most recent med list.          albuterol (2.5 MG/3ML) 0.083% nebulizer solution Commonly known as:  PROVENTIL Take 3 mLs (2.5 mg total) by nebulization every 4 (four) hours as needed for wheezing or shortness of breath.   Albuterol Sulfate 108 (90 Base) MCG/ACT Aepb Inhale 2 Doses into the lungs every 6 (six) hours as needed. For cough or wheeze   Azelastine HCl 0.15 % Soln Place 2 sprays into both nostrils 2 (two) times daily.   budesonide-formoterol 160-4.5 MCG/ACT inhaler Commonly known as:  SYMBICORT Take 2 puffs first thing in am and then another 2 puffs about 12 hours later.   EPINEPHrine 0.3 mg/0.3 mL Soaj injection Commonly known as:  EPI-PEN   HYDROcodone-acetaminophen 5-325 MG tablet Commonly known as:  NORCO/VICODIN Take 1 tablet by mouth every 6 (six) hours as needed for moderate pain.   ibuprofen 600 MG tablet Commonly known as:  ADVIL,MOTRIN Take 1 tablet (600 mg total) by mouth every 8 (eight) hours as needed.   levocetirizine 5 MG tablet Commonly known as:  XYZAL Take 1 tablet (5 mg total) by mouth every evening.   montelukast 10 MG tablet Commonly known as:  SINGULAIR Take 1 tablet (10 mg total) by mouth at bedtime.   Olopatadine HCl 0.2 % Soln Place 1 drop into both eyes daily.   omalizumab 150 MG injection Commonly known as:  XOLAIR Inject 375 mg into the skin every 14 (fourteen) days.   Tiotropium Bromide Monohydrate 1.25 MCG/ACT Aers Inhale 2 puffs into the lungs daily.       No Known Allergies  Review of systems: Review of systems negative except as noted in HPI / PMHx or noted below: Constitutional: Negative.  HENT: Negative.   Eyes: Negative.  Respiratory: Negative.   Cardiovascular: Negative.  Gastrointestinal: Negative.  Genitourinary: Negative.  Musculoskeletal: Negative.  Neurological: Negative.  Endo/Heme/Allergies: Negative.  Cutaneous: Negative.  Past Medical History:  Diagnosis Date  . Asthma   .  Sarcoidosis   . Sarcoidosis   . Sickle cell anemia (HCC)   . Sickle cell trait (HCC)     Family History  Problem Relation Age of Onset  . Asthma Mother   . Coronary artery disease Mother   . Stroke Mother   . Heart attack Mother   . Sickle cell trait Mother   . Prostate cancer Father   . Cancer Father 45       prostate  . Alcohol abuse Brother   . Early death Brother   . Drug abuse Brother   . Diabetes Neg Hx   . Heart disease Neg Hx   . Hyperlipidemia Neg Hx   . Hypertension Neg Hx   . Kidney disease Neg Hx     Social History   Socioeconomic History  . Marital status: Married    Spouse name: Not on file  . Number of children: Not on file  . Years of education: Not on file  . Highest education level: Not on file  Occupational History  . Not on file  Social Needs  . Financial resource strain: Not  on file  . Food insecurity:    Worry: Not on file    Inability: Not on file  . Transportation needs:    Medical: Not on file    Non-medical: Not on file  Tobacco Use  . Smoking status: Former Smoker    Packs/day: 0.50    Years: 10.00    Pack years: 5.00    Last attempt to quit: 10/04/1993    Years since quitting: 24.7  . Smokeless tobacco: Never Used  Substance and Sexual Activity  . Alcohol use: Yes    Alcohol/week: 48.0 standard drinks    Types: 3 Standard drinks or equivalent, 25 Shots of liquor, 20 Cans of beer per week  . Drug use: No  . Sexual activity: Yes  Lifestyle  . Physical activity:    Days per week: Not on file    Minutes per session: Not on file  . Stress: Not on file  Relationships  . Social connections:    Talks on phone: Not on file    Gets together: Not on file    Attends religious service: Not on file    Active member of club or organization: Not on file    Attends meetings of clubs or organizations: Not on file    Relationship status: Not on file  . Intimate partner violence:    Fear of current or ex partner: Not on file     Emotionally abused: Not on file    Physically abused: Not on file    Forced sexual activity: Not on file  Other Topics Concern  . Not on file  Social History Narrative  . Not on file    I appreciate the opportunity to take part in [name]'s care. Please do not hesitate to contact me with questions.  Sincerely,   R. Jorene Guest, MD

## 2018-07-04 NOTE — Patient Instructions (Addendum)
Severe persistent asthma Stable.  Continue omalizumab (Xolair) injections as prescribed, Symbicort 160-4.5 g, 2 inhalations via spacer device twice daily, Spiriva Respimat, 1.25 g 2 inhalations daily, and montelukast 10 mg daily at bedtime, and albuterol every 6 hours if needed.  Refill prescriptions have been provided.  Subjective and objective measures of pulmonary function will be followed and the treatment plan will be adjusted accordingly.  Perennial and seasonal allergic rhinitis  For now, continue appropriate allergen avoidance measures, levocetirizine as needed, montelukast daily, azelastine nasal spray as needed, and nasal saline irrigation if needed.  The risks and benefits of aeroallergen immunotherapy have been discussed. The patient is motivated to initiate immunotherapy if insurance coverage is favorable. He will let us know how he would like to proceed.   Return in about 4 months (around 11/04/2018), or if symptoms worsen or fail to improve.

## 2018-07-04 NOTE — Assessment & Plan Note (Signed)
Stable.  Continue omalizumab (Xolair) injections as prescribed, Symbicort 160-4.5 g, 2 inhalations via spacer device twice daily, Spiriva Respimat, 1.25 g 2 inhalations daily, and montelukast 10 mg daily at bedtime, and albuterol every 6 hours if needed.  Refill prescriptions have been provided.  Subjective and objective measures of pulmonary function will be followed and the treatment plan will be adjusted accordingly.

## 2018-07-04 NOTE — Assessment & Plan Note (Signed)
   For now, continue appropriate allergen avoidance measures, levocetirizine as needed, montelukast daily, azelastine nasal spray as needed, and nasal saline irrigation if needed.  The risks and benefits of aeroallergen immunotherapy have been discussed. The patient is motivated to initiate immunotherapy if insurance coverage is favorable. He will let us know how he would like to proceed.

## 2018-07-12 MED FILL — XOLAIR 150 MG SOLR: 150 | 28 days supply | Qty: 6 | Fill #8

## 2018-07-18 ENCOUNTER — Ambulatory Visit (INDEPENDENT_AMBULATORY_CARE_PROVIDER_SITE_OTHER): Payer: 59 | Admitting: *Deleted

## 2018-07-18 DIAGNOSIS — J4550 Severe persistent asthma, uncomplicated: Secondary | ICD-10-CM

## 2018-07-26 ENCOUNTER — Ambulatory Visit (INDEPENDENT_AMBULATORY_CARE_PROVIDER_SITE_OTHER): Payer: 59

## 2018-07-26 DIAGNOSIS — J4550 Severe persistent asthma, uncomplicated: Secondary | ICD-10-CM

## 2018-07-31 ENCOUNTER — Encounter (HOSPITAL_COMMUNITY): Payer: Self-pay | Admitting: *Deleted

## 2018-07-31 ENCOUNTER — Other Ambulatory Visit: Payer: Self-pay | Admitting: Urology

## 2018-07-31 DIAGNOSIS — Z125 Encounter for screening for malignant neoplasm of prostate: Secondary | ICD-10-CM

## 2018-07-31 NOTE — Progress Notes (Signed)
This encounter was created in error - please disregard.

## 2018-07-31 NOTE — Progress Notes (Signed)
Patient: [name]           Date of Birth: 1966-01-01           MRN: [id number] Visit Date: 07/31/2018 PCP: Etta Grandchild, MD     Prostate Exam Digital Rectal Exam Results: Abnormal  Overall Impressions: Suspicious Grade of Induration: Firmer (consistency of softball) Extent of Induration: Confined to prostate Approximate Size: Less than 1.5 cm     Patient's History Patient Active Problem List   Diagnosis Date Noted  . Allergic conjunctivitis 10/10/2017  . Essential hypertension, benign 05/24/2016  . Right lumbar radiculitis 02/04/2016  . Routine general medical examination at a health care facility 12/31/2015  . Degenerative disc disease, lumbar 12/31/2015  . Carpal tunnel syndrome of right wrist 10/02/2011  . Alcohol abuse 01/20/2009  . Perennial and seasonal allergic rhinitis 01/20/2009  . Severe persistent asthma 01/02/2009  . Sarcoidosis (HCC) 11/10/2007   Past Medical History:  Diagnosis Date  . Asthma   . Sarcoidosis   . Sarcoidosis   . Sickle cell anemia (HCC)   . Sickle cell trait (HCC)     Family History  Problem Relation Age of Onset  . Asthma Mother   . Coronary artery disease Mother   . Stroke Mother   . Heart attack Mother   . Sickle cell trait Mother   . Prostate cancer Father   . Cancer Father 77       prostate  . Alcohol abuse Brother   . Early death Brother   . Drug abuse Brother   . Diabetes Neg Hx   . Heart disease Neg Hx   . Hyperlipidemia Neg Hx   . Hypertension Neg Hx   . Kidney disease Neg Hx     Past Surgical History:  Procedure Laterality Date  . I&D EXTREMITY  01/19/2012   Procedure: IRRIGATION AND DEBRIDEMENT EXTREMITY;  Surgeon: Nadara Mustard, MD;  Location: [facility] OR;  Service: Orthopedics;  Laterality: Right;  . SHOULDER OPEN ROTATOR CUFF REPAIR  2007   Social History   Occupational History  . Not on file  Tobacco Use  . Smoking status: Former Smoker    Packs/day: 0.50    Years: 10.00    Pack years: 5.00   Last attempt to quit: 10/04/1993    Years since quitting: 24.8  . Smokeless tobacco: Never Used  Substance and Sexual Activity  . Alcohol use: Yes    Alcohol/week: 48.0 standard drinks    Types: 3 Standard drinks or equivalent, 25 Shots of liquor, 20 Cans of beer per week  . Drug use: No  . Sexual activity: Yes

## 2018-07-31 NOTE — Addendum Note (Signed)
Addended by: Lynnell Dike on: 07/31/2018 06:36 PM   Modules accepted: Level of Service, SmartSet

## 2018-08-01 LAB — PSA: Prostate Specific Ag, Serum: 1.4 ng/mL (ref 0.0–4.0)

## 2018-08-02 MED FILL — XOLAIR 150 MG SOLR: 150 | 28 days supply | Qty: 6 | Fill #9

## 2018-08-03 ENCOUNTER — Other Ambulatory Visit (HOSPITAL_COMMUNITY): Payer: Self-pay | Admitting: Orthopedic Surgery

## 2018-08-03 DIAGNOSIS — M5416 Radiculopathy, lumbar region: Secondary | ICD-10-CM

## 2018-08-03 MED FILL — HYDROCODON-APAP 5-325: 5-325 | 15 days supply | Qty: 30 | Fill #0

## 2018-08-04 ENCOUNTER — Telehealth (HOSPITAL_COMMUNITY): Payer: Self-pay | Admitting: *Deleted

## 2018-08-04 NOTE — Telephone Encounter (Signed)
Spoke with Evertte about PSA result. Explained this result was normal. Patients DRE was abnormal and Dr. Laverle Patter recommended follow-up with him at his office. Told patient will get appointment scheduled with Dr. Laverle Patter and let him know. Patient verbalized understanding.

## 2018-08-07 MED FILL — diazePAM 5 MG TABS: 5 | 1 days supply | Qty: 2 | Fill #0

## 2018-08-08 ENCOUNTER — Ambulatory Visit (HOSPITAL_COMMUNITY)
Admission: RE | Admit: 2018-08-08 | Discharge: 2018-08-08 | Disposition: A | Discharge status: Home / Self Care | Payer: 59 | Source: Ambulatory Visit | Attending: Orthopedic Surgery | Admitting: Orthopedic Surgery

## 2018-08-08 ENCOUNTER — Ambulatory Visit: Payer: Self-pay

## 2018-08-08 DIAGNOSIS — M5136 Other intervertebral disc degeneration, lumbar region: Secondary | ICD-10-CM | POA: Insufficient documentation

## 2018-08-08 DIAGNOSIS — M5126 Other intervertebral disc displacement, lumbar region: Secondary | ICD-10-CM | POA: Diagnosis not present

## 2018-08-08 DIAGNOSIS — M4316 Spondylolisthesis, lumbar region: Secondary | ICD-10-CM | POA: Diagnosis not present

## 2018-08-08 DIAGNOSIS — M545 Low back pain: Secondary | ICD-10-CM | POA: Diagnosis not present

## 2018-08-08 DIAGNOSIS — M5416 Radiculopathy, lumbar region: Secondary | ICD-10-CM

## 2018-08-09 ENCOUNTER — Ambulatory Visit: Payer: Self-pay

## 2018-08-14 MED FILL — IBUPROFEN 600 MG TABLET: 600 | 30 days supply | Qty: 90 | Fill #1

## 2018-08-15 ENCOUNTER — Ambulatory Visit (INDEPENDENT_AMBULATORY_CARE_PROVIDER_SITE_OTHER): Payer: 59 | Admitting: *Deleted

## 2018-08-15 DIAGNOSIS — J4550 Severe persistent asthma, uncomplicated: Secondary | ICD-10-CM | POA: Diagnosis not present

## 2018-08-17 MED FILL — DICLOFENAC SODIUM 1% GEL: 1 | 16 days supply | Qty: 100 | Fill #0

## 2018-08-22 ENCOUNTER — Telehealth: Payer: Self-pay | Admitting: Internal Medicine

## 2018-08-22 NOTE — Telephone Encounter (Signed)
Patient states his inflammation is in his back again. He wanted to know if he could get a refill of methylPREDNISolone (MEDROL DOSEPAK) 4 MG TBPK tablet Fill on 04/20/18.  Please advise or if he needs an appointment.   LOV 06/21/18

## 2018-08-22 NOTE — Telephone Encounter (Signed)
Not without a visit 

## 2018-08-22 NOTE — Telephone Encounter (Signed)
Appointment has been made for 11/20.

## 2018-08-23 ENCOUNTER — Encounter: Payer: Self-pay | Admitting: Internal Medicine

## 2018-08-23 ENCOUNTER — Telehealth (HOSPITAL_COMMUNITY): Payer: Self-pay | Admitting: *Deleted

## 2018-08-23 ENCOUNTER — Ambulatory Visit: Payer: 59 | Admitting: Internal Medicine

## 2018-08-23 VITALS — BP 142/96 | HR 85 | Temp 97.5°F | Resp 20 | Ht 69.1 in | Wt 157.0 lb

## 2018-08-23 DIAGNOSIS — M48061 Spinal stenosis, lumbar region without neurogenic claudication: Secondary | ICD-10-CM | POA: Diagnosis not present

## 2018-08-23 DIAGNOSIS — M5136 Other intervertebral disc degeneration, lumbar region: Secondary | ICD-10-CM | POA: Diagnosis not present

## 2018-08-23 DIAGNOSIS — I10 Essential (primary) hypertension: Secondary | ICD-10-CM | POA: Diagnosis not present

## 2018-08-23 DIAGNOSIS — Z0000 Encounter for general adult medical examination without abnormal findings: Secondary | ICD-10-CM

## 2018-08-23 MED ORDER — NEBIVOLOL HCL 5 MG PO TABS: 5.0000 mg | ORAL_TABLET | Freq: Every day | ORAL | 0 refills | Status: DC

## 2018-08-23 MED ORDER — METHYLPREDNISOLONE ACETATE 80 MG/ML IJ SUSP
120.0000 mg | Freq: Once | INTRAMUSCULAR | Status: AC
  Administered 2018-08-23: 120 mg via INTRAMUSCULAR

## 2018-08-23 MED ORDER — OXYCODONE-ACETAMINOPHEN 7.5-325 MG PO TABS: 1.0000 | ORAL_TABLET | Freq: Three times a day (TID) | ORAL | 0 refills | Status: DC | PRN

## 2018-08-23 MED FILL — OXYCODONE/APAP 7.5/325MG: 7.5-325 | 21 days supply | Qty: 65 | Fill #0

## 2018-08-23 MED FILL — BYSTOLIC 5 MG TABLET: 5 | 90 days supply | Qty: 90 | Fill #0

## 2018-08-23 NOTE — Patient Instructions (Signed)

## 2018-08-23 NOTE — Telephone Encounter (Signed)
Received patients follow-up appointment at [facility]. Patient is scheduled for Monday, August 28, 2018 at 1330. Called patient and gave him appointment date and time. Patient verbalized understanding.

## 2018-08-23 NOTE — Progress Notes (Signed)
Subjective:  Patient ID: [name], male    DOB: October 19, 1965  Age: 52 y.o. MRN: [id number]  CC: Back Pain and Hypertension   HPI [name] presents for f/up -he complains of a several week history of worsening low back pain that occasionally radiates into his right lower extremity.  Someone else ordered an MRI of his lumbar spine about 3 weeks ago.  He says that no one has gone over the result with him.  He has been trying to control the pain with ibuprofen and hydrocodone but says is not been effective.  He also complains of numbness, weakness, and tingling diffusely in both lower extremities.  Outpatient Medications Prior to Visit  Medication Sig Dispense Refill  . albuterol (PROVENTIL) (2.5 MG/3ML) 0.083% nebulizer solution Take 3 mLs (2.5 mg total) by nebulization every 4 (four) hours as needed for wheezing or shortness of breath. 75 mL 1  . Azelastine HCl 0.15 % SOLN Place 2 sprays into both nostrils 2 (two) times daily. 30 mL 5  . budesonide-formoterol (SYMBICORT) 160-4.5 MCG/ACT inhaler Take 2 puffs first thing in am and then another 2 puffs about 12 hours later. 1 Inhaler 5  . diazepam (VALIUM) 5 MG tablet   0  . diclofenac sodium (VOLTAREN) 1 % GEL   1  . EPINEPHrine 0.3 mg/0.3 mL IJ SOAJ injection   2  . ibuprofen (ADVIL,MOTRIN) 600 MG tablet Take 1 tablet (600 mg total) by mouth every 8 (eight) hours as needed. 90 tablet 1  . levocetirizine (XYZAL) 5 MG tablet Take 1 tablet (5 mg total) by mouth every evening. 30 tablet 5  . montelukast (SINGULAIR) 10 MG tablet Take 1 tablet (10 mg total) by mouth at bedtime. 30 tablet 5  . Olopatadine HCl (PATADAY) 0.2 % SOLN Place 1 drop into both eyes daily. 1 Bottle 5  . Tiotropium Bromide Monohydrate (SPIRIVA RESPIMAT) 1.25 MCG/ACT AERS Inhale 2 puffs into the lungs daily. 4 g 5  . HYDROcodone-acetaminophen (NORCO/VICODIN) 5-325 MG tablet Take 1 tablet by mouth every 6 (six) hours as needed for moderate pain. 35 tablet 0  . Albuterol  Sulfate (PROAIR RESPICLICK) 108 (90 Base) MCG/ACT AEPB Inhale 2 Doses into the lungs every 6 (six) hours as needed. For cough or wheeze 1 each 1  . omalizumab (XOLAIR) 150 MG injection Inject 375 mg into the skin every 14 (fourteen) days. 6 each 10   Facility-Administered Medications Prior to Visit  Medication Dose Route Frequency Provider Last Rate Last Dose  . omalizumab Geoffry Paradise) injection 375 mg  375 mg Subcutaneous Q14 Days Bobbitt, Heywood Iles, MD   375 mg at 08/15/18 1822    ROS Review of Systems  Constitutional: Negative for diaphoresis, fatigue and unexpected weight change.  HENT: Negative.   Eyes: Negative for visual disturbance.  Respiratory: Positive for wheezing. Negative for cough, chest tightness and shortness of breath.   Cardiovascular: Negative for chest pain, palpitations and leg swelling.  Gastrointestinal: Negative for abdominal pain, constipation, diarrhea, nausea and vomiting.  Genitourinary: Negative.  Negative for difficulty urinating.  Musculoskeletal: Positive for back pain. Negative for arthralgias, myalgias and neck pain.  Skin: Negative.  Negative for color change.  Neurological: Positive for weakness and numbness. Negative for dizziness and headaches.  Hematological: Negative for adenopathy. Does not bruise/bleed easily.  Psychiatric/Behavioral: Negative.     Objective:  BP (!) 142/96 (BP Location: Left Arm, Patient Position: Sitting, Cuff Size: Normal)   Pulse 85   Temp (!) 97.5 F (36.4 C) (  Oral)   Resp 20   Ht 5' 9.1" (1.755 m)   Wt 157 lb (71.2 kg)   SpO2 98%   BMI 23.12 kg/m   BP Readings from Last 3 Encounters:  08/23/18 (!) 142/96  07/04/18 116/80  06/21/18 118/90    Wt Readings from Last 3 Encounters:  08/23/18 157 lb (71.2 kg)  07/04/18 157 lb (71.2 kg)  06/21/18 156 lb 12 oz (71.1 kg)    Physical Exam  Constitutional: He is oriented to person, place, and time. He appears distressed.  He occasionally writhes in pain and shows  signs of discomfort  HENT:  Mouth/Throat: Oropharynx is clear and moist. No oropharyngeal exudate.  Eyes: Conjunctivae are normal. No scleral icterus.  Neck: Normal range of motion. Neck supple. No JVD present. No thyromegaly present.  Cardiovascular: Normal rate and normal heart sounds.  No murmur heard. Pulmonary/Chest: Effort normal and breath sounds normal. No respiratory distress. He has no wheezes. He has no rales. He exhibits no tenderness.  Abdominal: Soft. Bowel sounds are normal. He exhibits no mass. There is no hepatosplenomegaly. There is no tenderness.  Musculoskeletal: Normal range of motion. He exhibits no edema, tenderness or deformity.  Lymphadenopathy:    He has no cervical adenopathy.  Neurological: He is alert and oriented to person, place, and time. He displays abnormal reflex. No cranial nerve deficit or sensory deficit. He exhibits normal muscle tone. Coordination normal.  Reflex Scores:      Tricep reflexes are 1+ on the right side and 1+ on the left side.      Bicep reflexes are 1+ on the right side and 1+ on the left side.      Brachioradialis reflexes are 1+ on the right side and 1+ on the left side.      Patellar reflexes are 1+ on the right side and 0 on the left side.      Achilles reflexes are 1+ on the right side and 1+ on the left side. + SLR in BLE  Skin: He is not diaphoretic.  Vitals reviewed.   Lab Results  Component Value Date   WBC 4.8 10/10/2017   HGB 16.6 10/10/2017   HCT 46.4 10/10/2017   PLT 253 10/10/2017   GLUCOSE 88 03/13/2018   CHOL 198 03/13/2018   TRIG 121.0 03/13/2018   HDL 55.10 03/13/2018   LDLCALC 119 (H) 03/13/2018   ALT 27 03/13/2018   AST 26 03/13/2018   NA 138 03/13/2018   K 4.2 03/13/2018   CL 99 03/13/2018   CREATININE 1.02 03/13/2018   BUN 14 03/13/2018   CO2 32 03/13/2018   TSH 0.94 12/31/2015   PSA 1.54 03/13/2018    Mr Lumbar Spine Wo Contrast  Result Date: 08/08/2018 CLINICAL DATA:  52 year old male with  low back pain radiating down the right side. Severe pain and stiffness. EXAM: MRI LUMBAR SPINE WITHOUT CONTRAST TECHNIQUE: Multiplanar, multisequence MR imaging of the lumbar spine was performed. No intravenous contrast was administered. COMPARISON:  Lumbar MRI 03/02/2016 and lumbar radiographs 12/31/2015. FINDINGS: Segmentation: Mildly transitional L5 level as demonstrated on the 2017 radiographs with bilateral L5-S1 assimilation joints but fairly full sized L5-S1 disc space. This is the same numbering system used on the 2017 MRI. Alignment: Stable with mild straightening of lumbar lordosis and subtle anterolisthesis of L4 on L5. Vertebrae: Resolved marrow edema in the left side L4 and L5 posterior elements, and in the right side L4 posterior elements. Decreased but persistent marrow edema in the  right L5 vertebra, now primarily the anterior right pedicle and posterior right body is seen on series 6, images 1 and 2. This marrow edema is in proximity to a highly sclerotic right L5-S1 assimilation joint which is best demonstrated on series 8, image 30, hypertrophied since 2017, and not included on the sagittal STIR images. Intact visible sacrum and SI joints. Minor chronic degenerative marrow signal changes at the anterior superior L4 endplate. Conus medullaris and cauda equina: Conus extends to the T12 level in normal. Paraspinal and other soft tissues: Negative. Disc levels: T11-T12: Negative. T12-L1:  Negative. L1-L2:  Negative disc.  Mild facet hypertrophy.  No stenosis. L2-L3:  Negative disc.  Mild facet hypertrophy.  No stenosis. L3-L4:  Minimal disc bulge.  Mild facet hypertrophy.  No stenosis. L4-L5: Mild anterolisthesis. Chronic disc desiccation and circumferential disc bulge. New broad-based but fairly small left lateral recess disc protrusion (series 5, image 8 and series 7, image 26) with moderate to severe lateral recess stenosis at the descending left L5 nerve root level. No L4 foraminal involvement or  significant spinal stenosis. Moderate facet hypertrophy with decreased degenerative facet joint fluid since 2017. L5-S1: Mildly transitional anatomy. No disc or facet degeneration although there are degenerated assimilation joints, with progression on right side since 2017. IMPRESSION: 1. The symptomatic abnormality with regard to right side pain is probably the L5-S1 level which is mildly transitional and where degeneration of an assimilation joint on the right appears progressed since 2017. There is mild associated right L5 pedicle and posterolateral vertebral body marrow edema. 2. Mild chronic grade 1 anterolisthesis at L4-L5 with progressed disc degeneration since 2017 in the form of a new small disc herniation into the left lateral recess affecting the descending left L5 nerve roots. 3. Largely resolved degenerative marrow edema in the bilateral L4-L5 facets since the 2017 MRI. Electronically Signed   By: Odessa Fleming M.D.   On: 08/08/2018 08:49    Assessment & Plan:   Wojciech was seen today for back pain and hypertension.  Diagnoses and all orders for this visit:  Essential hypertension, benign- His blood pressure is not adequately well controlled.  I have asked him to start taking nebivolol. -     nebivolol (BYSTOLIC) 5 MG tablet; Take 1 tablet (5 mg total) by mouth daily.  Routine general medical examination at a health care facility  DDD (degenerative disc disease), lumbar- Will upgrade his pain relief to oxycodone.  I have also given him an injection of methylprednisolone to reduce the pain and inflammation -     oxyCODONE-acetaminophen (PERCOCET) 7.5-325 MG tablet; Take 1 tablet by mouth every 8 (eight) hours as needed for severe pain. -     methylPREDNISolone acetate (DEPO-MEDROL) injection 120 mg  Spinal stenosis at L4-L5 level- His recent MRI indicates that there is a new area of stenosis at L3-L4.  He is hyperreflexic in the left patella.  I have asked him to see neurosurgery to see if he  is a candidate for surgical intervention. -     Ambulatory referral to Neurosurgery   I have discontinued Deano Silveria's omalizumab, HYDROcodone-acetaminophen, and Albuterol Sulfate. I am also having him start on nebivolol and oxyCODONE-acetaminophen. Additionally, I am having him maintain his albuterol, levocetirizine, Azelastine HCl, Olopatadine HCl, EPINEPHrine, ibuprofen, montelukast, Tiotropium Bromide Monohydrate, budesonide-formoterol, diazepam, and diclofenac sodium. We administered methylPREDNISolone acetate. We will continue to administer omalizumab.  Meds ordered this encounter  Medications  . nebivolol (BYSTOLIC) 5 MG tablet    Sig:  Take 1 tablet (5 mg total) by mouth daily.    Dispense:  90 tablet    Refill:  0  . oxyCODONE-acetaminophen (PERCOCET) 7.5-325 MG tablet    Sig: Take 1 tablet by mouth every 8 (eight) hours as needed for severe pain.    Dispense:  65 tablet    Refill:  0  . methylPREDNISolone acetate (DEPO-MEDROL) injection 120 mg     Follow-up: Return in about 4 weeks (around 09/20/2018).  Sanda Lingerhomas Koni Kannan, MD

## 2018-08-28 DIAGNOSIS — R972 Elevated prostate specific antigen [PSA]: Secondary | ICD-10-CM | POA: Diagnosis not present

## 2018-08-29 ENCOUNTER — Ambulatory Visit (INDEPENDENT_AMBULATORY_CARE_PROVIDER_SITE_OTHER): Payer: 59 | Admitting: *Deleted

## 2018-08-29 DIAGNOSIS — J4550 Severe persistent asthma, uncomplicated: Secondary | ICD-10-CM

## 2018-08-30 MED FILL — traMADol HCL 50 MG TABS: 50 | 15 days supply | Qty: 60 | Fill #0

## 2018-09-04 MED FILL — XOLAIR 150 MG SOLR: 150 | 28 days supply | Qty: 6 | Fill #10

## 2018-09-11 ENCOUNTER — Other Ambulatory Visit: Payer: Self-pay | Admitting: Internal Medicine

## 2018-09-11 DIAGNOSIS — D869 Sarcoidosis, unspecified: Secondary | ICD-10-CM

## 2018-09-12 ENCOUNTER — Ambulatory Visit (INDEPENDENT_AMBULATORY_CARE_PROVIDER_SITE_OTHER): Payer: 59 | Admitting: *Deleted

## 2018-09-12 DIAGNOSIS — J4550 Severe persistent asthma, uncomplicated: Secondary | ICD-10-CM | POA: Diagnosis not present

## 2018-09-21 DIAGNOSIS — M5440 Lumbago with sciatica, unspecified side: Secondary | ICD-10-CM | POA: Diagnosis not present

## 2018-09-25 ENCOUNTER — Ambulatory Visit: Payer: 59

## 2018-09-25 MED FILL — SYMBICORT 160-4.5 MCG INH: 160-4.5 | 30 days supply | Qty: 10 | Fill #0

## 2018-09-25 MED FILL — VENTOLIN HFA 90 MCG INHALER: 108 (90 BAS | 25 days supply | Qty: 18 | Fill #0

## 2018-09-26 ENCOUNTER — Ambulatory Visit (INDEPENDENT_AMBULATORY_CARE_PROVIDER_SITE_OTHER): Payer: 59 | Admitting: *Deleted

## 2018-09-26 DIAGNOSIS — J4550 Severe persistent asthma, uncomplicated: Secondary | ICD-10-CM | POA: Diagnosis not present

## 2018-09-29 ENCOUNTER — Telehealth: Payer: Self-pay | Admitting: *Deleted

## 2018-09-29 NOTE — Telephone Encounter (Signed)
Received a fax for FMLA paperwork through [employer]. Paperwork has been placed on Dr. Sheran FavaBobbitt's desk to be reviewed when he is in the office next week. Will need to insure that Dr. Rod CanBobbit receives paperwork next week.

## 2018-10-02 ENCOUNTER — Telehealth: Payer: Self-pay | Admitting: Emergency Medicine

## 2018-10-02 NOTE — Telephone Encounter (Signed)
Forms completed and placed in Dr. Nunzio CobbsBobbitt box to be signed.

## 2018-10-02 NOTE — Telephone Encounter (Signed)
Signed. Thanks.

## 2018-10-02 NOTE — Telephone Encounter (Signed)
Pt came in a requested a refill on his pain medication. He also left some paperwork from Dr Donald SivaGara. Dr Donald SivaGara stated it would be reasonable for him to have an MRI on his hip, however this would not be a Workers Comp related issue and to discuss and request referral from PCP. Please advise thanks.

## 2018-10-02 NOTE — Telephone Encounter (Signed)
I do not want him to take a narcotic pain medicines long-term. If he is still in pain then I will refer him to a pain specialist.  Which hip needs to be x-rayed?  TJ

## 2018-10-03 ENCOUNTER — Other Ambulatory Visit (HOSPITAL_COMMUNITY): Payer: Self-pay | Admitting: Orthopedic Surgery

## 2018-10-03 ENCOUNTER — Telehealth: Payer: Self-pay | Admitting: Internal Medicine

## 2018-10-03 DIAGNOSIS — M5416 Radiculopathy, lumbar region: Secondary | ICD-10-CM

## 2018-10-03 NOTE — Telephone Encounter (Signed)
Copied from CRM #[id number]. Topic: Quick Communication - Office Called Patient (Clinic Use ONLY) >> Oct 03, 2018 10:36 AM Simmons, Stacey N, CMA wrote:  Left mess for patient to call back Re: below per Dr. Jones.  1. I do not want him to take a narcotic pain medicines long-term. If he is still in pain then I will refer him to a pain specialist.  2. Which hip needs to be x-rayed? PEC please ask pt or transfer call to office.  >> Oct 03, 2018 10:42 AM Guadalupe Kerekes N, NT wrote:  Pt states that his has Tramadol on hand and wants to know if he is ok to take this medication. He also states he would like the referral to the pain clinic. Pt states his right hip needs an MRI instead of a x-ray. He states that Dr. Rowen at [facility] prescribed him the Tramadol. Pt would like to see if 600 or 800 mg ibprophen can be called in? Please advise.   

## 2018-10-03 NOTE — Telephone Encounter (Signed)
Left mess for patient to call back. CRM created.  

## 2018-10-05 ENCOUNTER — Other Ambulatory Visit (HOSPITAL_COMMUNITY): Payer: Self-pay | Admitting: Orthopedic Surgery

## 2018-10-05 ENCOUNTER — Other Ambulatory Visit: Payer: Self-pay | Admitting: Internal Medicine

## 2018-10-05 DIAGNOSIS — G8929 Other chronic pain: Secondary | ICD-10-CM

## 2018-10-05 DIAGNOSIS — M25551 Pain in right hip: Secondary | ICD-10-CM

## 2018-10-05 DIAGNOSIS — M5416 Radiculopathy, lumbar region: Secondary | ICD-10-CM

## 2018-10-05 DIAGNOSIS — M48061 Spinal stenosis, lumbar region without neurogenic claudication: Secondary | ICD-10-CM

## 2018-10-05 DIAGNOSIS — Z0189 Encounter for other specified special examinations: Secondary | ICD-10-CM

## 2018-10-05 NOTE — Telephone Encounter (Signed)
Previous note already started. Closing this note

## 2018-10-05 NOTE — Telephone Encounter (Signed)
Copied from CRM [id number]. Topic: Quick Conservator, museum/gallery Patient (Clinic Use ONLY) >> Oct 03, 2018 10:36 AM Merrilyn Puma, CMA wrote:  Left mess for patient to call back Re: below per Dr. Yetta Barre.  1. I do not want him to take a narcotic pain medicines long-term. If he is still in pain then I will refer him to a pain specialist.  2. Which hip needs to be x-rayed? PEC please ask pt or transfer call to office.  >> Oct 03, 2018 10:42 AM Arlyss Gandy, NT wrote:  Pt states that his has Tramadol on hand and wants to know if he is ok to take this medication. He also states he would like the referral to the pain clinic. Pt states his right hip needs an MRI instead of a x-ray. He states that Dr. Elita Quick at [facility] prescribed him the Tramadol. Pt would like to see if 600 or 800 mg ibprophen can be called in? Please advise.

## 2018-10-05 NOTE — Telephone Encounter (Signed)
1. Pt is okay with referral to pain management  2. Right Hip - pt is requesting an MRI  3. Okay for pt to take tramadol.   4. Pt is requesting rx for 800 mg ibuprofen.

## 2018-10-06 ENCOUNTER — Other Ambulatory Visit: Payer: Self-pay | Admitting: Allergy and Immunology

## 2018-10-06 ENCOUNTER — Other Ambulatory Visit: Payer: Self-pay | Admitting: Pharmacist

## 2018-10-06 MED ORDER — OMALIZUMAB 150 MG ~~LOC~~ SOLR: SUBCUTANEOUS | 11 refills | Status: DC

## 2018-10-06 NOTE — Progress Notes (Signed)
Per pt - requesting comparison MRI of left hip. This has been entered and sent to PCP to sign.

## 2018-10-09 ENCOUNTER — Other Ambulatory Visit: Payer: Self-pay | Admitting: Internal Medicine

## 2018-10-09 ENCOUNTER — Ambulatory Visit (HOSPITAL_COMMUNITY)
Admission: RE | Admit: 2018-10-09 | Discharge: 2018-10-09 | Disposition: A | Discharge status: Home / Self Care | Payer: 59 | Source: Ambulatory Visit | Attending: Orthopedic Surgery | Admitting: Orthopedic Surgery

## 2018-10-09 DIAGNOSIS — M25551 Pain in right hip: Principal | ICD-10-CM

## 2018-10-09 DIAGNOSIS — M47816 Spondylosis without myelopathy or radiculopathy, lumbar region: Secondary | ICD-10-CM | POA: Diagnosis not present

## 2018-10-09 DIAGNOSIS — M48061 Spinal stenosis, lumbar region without neurogenic claudication: Secondary | ICD-10-CM

## 2018-10-09 DIAGNOSIS — M5416 Radiculopathy, lumbar region: Secondary | ICD-10-CM | POA: Diagnosis not present

## 2018-10-09 DIAGNOSIS — G8929 Other chronic pain: Secondary | ICD-10-CM

## 2018-10-09 MED ORDER — IBUPROFEN 800 MG PO TABS: 800.0000 mg | ORAL_TABLET | Freq: Three times a day (TID) | ORAL | 0 refills | Status: DC | PRN

## 2018-10-09 MED FILL — IBUPROFEN 800 MG TAB: 800 | 30 days supply | Qty: 90 | Fill #0

## 2018-10-10 ENCOUNTER — Telehealth: Payer: Self-pay | Admitting: Internal Medicine

## 2018-10-10 ENCOUNTER — Ambulatory Visit: Payer: Self-pay

## 2018-10-10 NOTE — Telephone Encounter (Signed)
Copied from CRM [id number]. Topic: Quick Communication - See Telephone Encounter >> Oct 10, 2018  8:44 AM Baldo Daub L wrote: CRM for notification. See Telephone encounter for: 10/10/18.  Pt states that he got a letter from [employer] (his [facility] provider) stating that his update certification documentation will expire on 10/20/2018 and he needs his PCP to complete this.  Pt states that [place] usually helps him with this and he needs to speak with her.  Pt thought that this had already been completed, but has found out that it hasn't. Pt can be reached at [phone]  Spoke with patient and he has been unable to get in touch with his agent at [facility]. I have printed off the old on and made the updates and faxed it to [employer]. In hopes if they want a new one filled out they will send it to [place]. Copy sent to scan.

## 2018-10-13 ENCOUNTER — Ambulatory Visit (INDEPENDENT_AMBULATORY_CARE_PROVIDER_SITE_OTHER): Payer: 59 | Admitting: *Deleted

## 2018-10-13 DIAGNOSIS — J4550 Severe persistent asthma, uncomplicated: Secondary | ICD-10-CM

## 2018-10-13 MED FILL — XOLAIR 150 MG SOLR: 150 | 28 days supply | Qty: 6 | Fill #0

## 2018-10-13 MED FILL — DICLOFENAC SODIUM 1 % GEL: 1 | 16 days supply | Qty: 100 | Fill #1

## 2018-10-17 ENCOUNTER — Telehealth: Payer: Self-pay | Admitting: Internal Medicine

## 2018-10-17 ENCOUNTER — Other Ambulatory Visit: Payer: Self-pay | Admitting: Internal Medicine

## 2018-10-17 MED ORDER — DIAZEPAM 5 MG PO TABS: 5.0000 mg | ORAL_TABLET | Freq: Two times a day (BID) | ORAL | 0 refills | Status: DC | PRN

## 2018-10-17 MED FILL — diazePAM 5 MG TABS: 5 | 1 days supply | Qty: 2 | Fill #0

## 2018-10-17 NOTE — Telephone Encounter (Signed)
lvm for pt informing of same °

## 2018-10-17 NOTE — Telephone Encounter (Signed)
Patient is requesting a valium for this MRI on next Tuesday 10/24/18 of his hips.   [facility] - [place], [place] - [address] [place] [phone] (Phone) [phone] (Fax)

## 2018-10-17 NOTE — Telephone Encounter (Signed)
RX sent to [facility] 

## 2018-10-24 ENCOUNTER — Encounter: Payer: Self-pay | Admitting: Internal Medicine

## 2018-10-24 ENCOUNTER — Ambulatory Visit (INDEPENDENT_AMBULATORY_CARE_PROVIDER_SITE_OTHER): Payer: 59 | Admitting: *Deleted

## 2018-10-24 ENCOUNTER — Ambulatory Visit
Admission: RE | Admit: 2018-10-24 | Discharge: 2018-10-24 | Disposition: A | Discharge status: Home / Self Care | Payer: 59 | Source: Ambulatory Visit | Attending: Internal Medicine | Admitting: Internal Medicine

## 2018-10-24 ENCOUNTER — Other Ambulatory Visit: Payer: Self-pay | Admitting: Internal Medicine

## 2018-10-24 DIAGNOSIS — S43431A Superior glenoid labrum lesion of right shoulder, initial encounter: Secondary | ICD-10-CM | POA: Diagnosis not present

## 2018-10-24 DIAGNOSIS — J4550 Severe persistent asthma, uncomplicated: Secondary | ICD-10-CM | POA: Diagnosis not present

## 2018-10-24 DIAGNOSIS — M25551 Pain in right hip: Principal | ICD-10-CM

## 2018-10-24 DIAGNOSIS — M75111 Incomplete rotator cuff tear or rupture of right shoulder, not specified as traumatic: Secondary | ICD-10-CM | POA: Diagnosis not present

## 2018-10-24 DIAGNOSIS — Z0189 Encounter for other specified special examinations: Secondary | ICD-10-CM

## 2018-10-24 DIAGNOSIS — G8929 Other chronic pain: Secondary | ICD-10-CM

## 2018-10-24 DIAGNOSIS — M87059 Idiopathic aseptic necrosis of unspecified femur: Secondary | ICD-10-CM

## 2018-10-30 ENCOUNTER — Telehealth: Payer: Self-pay | Admitting: *Deleted

## 2018-10-30 NOTE — Telephone Encounter (Signed)
Mr. Bamonte called stating that the pharmacy would not let him pick up his Xolair stating that it was too early. Spoke with Mr. Lomboy and the pharmacy technician and explained the dates that he received his Xolair injections this month. The pharmacy technician explained that it was a little to early for insurance to cover his medicine and that it should be paid for and be available for 11/06/2018 which is the day before his next due injection of Xolair. Confirmed with the pharmacy technician that it should be covered by insurance at that time and should not be an issue for Mr. Coulibaly to receive his injection.

## 2018-11-01 ENCOUNTER — Encounter (INDEPENDENT_AMBULATORY_CARE_PROVIDER_SITE_OTHER): Payer: Self-pay | Admitting: Orthopaedic Surgery

## 2018-11-01 ENCOUNTER — Ambulatory Visit (INDEPENDENT_AMBULATORY_CARE_PROVIDER_SITE_OTHER): Payer: 59 | Admitting: Orthopaedic Surgery

## 2018-11-01 DIAGNOSIS — M87052 Idiopathic aseptic necrosis of left femur: Secondary | ICD-10-CM | POA: Insufficient documentation

## 2018-11-01 DIAGNOSIS — M87051 Idiopathic aseptic necrosis of right femur: Secondary | ICD-10-CM | POA: Diagnosis not present

## 2018-11-01 MED ORDER — HYDROCODONE-ACETAMINOPHEN 5-325 MG PO TABS: 1.0000 | ORAL_TABLET | Freq: Four times a day (QID) | ORAL | 0 refills | Status: DC | PRN

## 2018-11-01 MED FILL — HYDROCODON-APAP 5-325: 5-325 | 10 days supply | Qty: 40 | Fill #0

## 2018-11-01 NOTE — Progress Notes (Signed)
Office Visit Note   Patient: [name]           Date of Birth: 10-25-1965           MRN: [id number] Visit Date: 11/01/2018              Requested by: Etta GrandchildJones, Thomas L, MD [address] [place] [place], [place] [id number] PCP: Etta GrandchildJones, Thomas L, MD   Assessment & Plan: Visit Diagnoses:  1. Avascular necrosis of bone of hip, left (HCC)   2. Avascular necrosis of bone of right hip (HCC)     Plan: Given the severity of his right hip pain combined with the severity of his right hip avascular necrosis and what I feel is impending femoral head collapse, I feel that he needs an expedited total hip arthroplasty.  I feel this is medically and clinically warranted at this point given his clinical exam findings and this MRI.  I showed him a hip model in the office and explained in detail what this involves.  We had a long and thorough discussion about the risk and benefits of surgery.  I do feel he will eventually need this on his left hip as well.  Also feel that this may help alleviate some of his back symptoms if this can help his posture improve although it will not fix the facet disease that he has at L4-L5.  He understands my recommendations for surgery and the rationale behind this.  He is going work on his short-term disability and work and work on getting him set up for surgery hopefully in the next week to replace his right hip.  I will send in some hydrocodone for pain.  Follow-Up Instructions: Return for 2 weeks post-op.   Orders:  No orders of the defined types were placed in this encounter.  No orders of the defined types were placed in this encounter.     Procedures: No procedures performed   Clinical Data: No additional findings.   Subjective: Chief Complaint  Patient presents with  . Left Hip - Pain  . Right Hip - Pain  . Lower Back - Pain  The patient is someone to have actually seen in the past remotely for his shoulder.  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  He is unfortunately been dealing with lumbar spine issues since a hard mechanical fall when cleaning a floor back in 2017.  He is even had multiple injections in his back and I believe radiofrequency ablation.  He does have a history of sarcoidosis and has had steroids quite a bit in the past.  He has been developing significant worsening of his hip pain and it was felt that it was likely his spine however MRIs were obtained of both hips this month showing avascular necrosis of both hips significantly worse on the right than left and I have these for my review.  He does have groin pain bilaterally much more severe on the right than the left.  He says when he gets up to go to the bathroom at night he is having use her crutches to get there because the pain is become so severe.  He is done everything he can to work through the pain.  It hurts all around his right hip in the front and the back and going down to his knee.  It is less on the left but it is  present on the left.  He denies any numbness and tingling in his feet.  He is seen a neurosurgeon and they have not been able to offer much else for his spine and do not feel that surgery is needed on spine.  However after the MRI is from his hip showed significant avascular necrosis of both hips it may be that some of his spine issues are exacerbated from his hips as well.  His MRI of his lumbar spine does show severe facet arthropathy at L4-L5.  The MRI of his hip shows severe avascular necrosis in both femoral heads with impending femoral head collapse.  This is more so on the right than the left.  His hip pain is severe and it is 10 out of 10.  It is detrimentally affecting his mobility, his quality of life and his activities daily living and adversely affecting his job as well.  This is been getting worse rapidly over the last several months. HPI  Review of Systems He  currently denies any headache, chest pain, shortness of breath, fever, chills, nausea, vomiting  Objective: Vital Signs: There were no vitals taken for this visit.  Physical Exam He is alert and orient x3 and in no acute distress Ortho Exam Examination of both hips shows pain throughout the arc of motion but no blocks to motion but is certainly more stiff on the right than the left and his right side is incredibly painful with putting him through motion to the point that he does not even want to sit down.  He mainly even stands as I am talking to him and during the exam. Specialty Comments:  No specialty comments available.  Imaging: No results found. The MRI of both hips is independently reviewed.  It shows profound avascular necrosis involving a wide area of the right femoral head and a decent area of the left femoral head.  This is significantly subchondral and there is no fracture but it appears as if there is impending femoral head collapse especially on the right side.  The AVN is quite severe and significant bilaterally again right worse than left.  PMFS History: Patient Active Problem List   Diagnosis Date Noted  . Avascular necrosis of bone of right hip (HCC) 11/01/2018  . Avascular necrosis of bone of hip, left (HCC) 11/01/2018  . Avascular necrosis of hip, unspecified laterality (HCC) 10/24/2018  . Chronic right hip pain 10/05/2018  . Spinal stenosis at L4-L5 level 08/23/2018  . Essential hypertension, benign 05/24/2016  . DDD (degenerative disc disease), lumbar 02/04/2016  . Routine general medical examination at a health care facility 12/31/2015  . Carpal tunnel syndrome of right wrist 10/02/2011  . Alcohol abuse 01/20/2009  . Perennial and seasonal allergic rhinitis 01/20/2009  . Severe persistent asthma 01/02/2009  . Sarcoidosis (HCC) 11/10/2007   Past Medical History:  Diagnosis Date  . Asthma   . Sarcoidosis   . Sarcoidosis   . Sickle cell anemia (HCC)   .  Sickle cell trait (HCC)     Family History  Problem Relation Age of Onset  . Asthma Mother   . Coronary artery disease Mother   . Stroke Mother   . Heart attack Mother   . Sickle cell trait Mother   . Prostate cancer Father   . Cancer Father [phone]       prostate  . Alcohol abuse Brother   . Early death Brother   . Drug abuse Brother   . Diabetes Neg Hx   .  Heart disease Neg Hx   . Hyperlipidemia Neg Hx   . Hypertension Neg Hx   . Kidney disease Neg Hx     Past Surgical History:  Procedure Laterality Date  . I&D EXTREMITY  01/19/2012   Procedure: IRRIGATION AND DEBRIDEMENT EXTREMITY;  Surgeon: Nadara Mustard, MD;  Location: [facility] OR;  Service: Orthopedics;  Laterality: Right;  . SHOULDER OPEN ROTATOR CUFF REPAIR  2007   Social History   Occupational History  . Not on file  Tobacco Use  . Smoking status: Former Smoker    Packs/day: 0.50    Years: 10.00    Pack years: 5.00    Last attempt to quit: 10/04/1993    Years since quitting: 25.0  . Smokeless tobacco: Never Used  Substance and Sexual Activity  . Alcohol use: Yes    Alcohol/week: 48.0 standard drinks    Types: 3 Standard drinks or equivalent, 25 Shots of liquor, 20 Cans of beer per week  . Drug use: No  . Sexual activity: Yes

## 2018-11-02 ENCOUNTER — Encounter (HOSPITAL_COMMUNITY): Payer: Self-pay

## 2018-11-02 ENCOUNTER — Other Ambulatory Visit: Payer: Self-pay

## 2018-11-02 ENCOUNTER — Other Ambulatory Visit (INDEPENDENT_AMBULATORY_CARE_PROVIDER_SITE_OTHER): Payer: Self-pay | Admitting: Physician Assistant

## 2018-11-02 ENCOUNTER — Other Ambulatory Visit (INDEPENDENT_AMBULATORY_CARE_PROVIDER_SITE_OTHER): Payer: Self-pay | Admitting: Orthopaedic Surgery

## 2018-11-02 ENCOUNTER — Encounter (HOSPITAL_COMMUNITY)
Admission: RE | Admit: 2018-11-02 | Discharge: 2018-11-02 | Disposition: A | Discharge status: Home / Self Care | Payer: 59 | Source: Ambulatory Visit | Attending: Orthopaedic Surgery | Admitting: Orthopaedic Surgery

## 2018-11-02 DIAGNOSIS — M879 Osteonecrosis, unspecified: Secondary | ICD-10-CM | POA: Insufficient documentation

## 2018-11-02 DIAGNOSIS — Z01818 Encounter for other preprocedural examination: Secondary | ICD-10-CM | POA: Diagnosis not present

## 2018-11-02 HISTORY — DX: Idiopathic aseptic necrosis of unspecified bone: M87.00

## 2018-11-02 HISTORY — DX: Essential (primary) hypertension: I10

## 2018-11-02 LAB — CBC
HCT: 43.9 % (ref 39.0–52.0)
Hemoglobin: 15.6 g/dL (ref 13.0–17.0)
MCH: 29.3 pg (ref 26.0–34.0)
MCHC: 35.5 g/dL (ref 30.0–36.0)
MCV: 82.5 fL (ref 80.0–100.0)
Platelets: 219 10*3/uL (ref 150–400)
RBC: 5.32 MIL/uL (ref 4.22–5.81)
RDW: 13.3 % (ref 11.5–15.5)
WBC: 4.4 10*3/uL (ref 4.0–10.5)
nRBC: 0 % (ref 0.0–0.2)

## 2018-11-02 LAB — SURGICAL PCR SCREEN
MRSA, PCR: NEGATIVE
Staphylococcus aureus: NEGATIVE

## 2018-11-02 LAB — BASIC METABOLIC PANEL
Anion gap: 7 (ref 5–15)
BUN: 14 mg/dL (ref 6–20)
CO2: 26 mmol/L (ref 22–32)
Calcium: 9 mg/dL (ref 8.9–10.3)
Chloride: 103 mmol/L (ref 98–111)
Creatinine, Ser: 0.83 mg/dL (ref 0.61–1.24)
GFR calc Af Amer: >60 mL/min (ref >60)
GFR calc non Af Amer: >60 mL/min (ref >60)
Glucose, Bld: 93 mg/dL (ref 70–99)
Potassium: 3.6 mmol/L (ref 3.5–5.1)
Sodium: 136 mmol/L (ref 135–145)

## 2018-11-02 NOTE — Progress Notes (Signed)
Need orders in epic.  Surgery on 11/10/2018.  Preop on 11/02/2018 at 130pm. Thanks.

## 2018-11-02 NOTE — Patient Instructions (Signed)
[name]  11/02/2018   Your procedure is scheduled on: 11/10/2018  Report to [facility] Main  Entrance  Report to admitting at   1100 AM    Call this number if you have problems the morning of surgery [phone]   Remember: Do not eat food  :After Midnight. BRUSH YOUR TEETH MORNING OF SURGERY AND RINSE YOUR MOUTH OUT, NO CHEWING GUM CANDY OR MINTS.  May have clear liquids from 12 midnite until 0700am morning of surgery then nothing by mouth.     CLEAR LIQUID DIET   Foods Allowed                                                                     Foods Excluded  Coffee and tea, regular and decaf                             liquids that you cannot  Plain Jell-O in any flavor                                             see through such as: Fruit ices (not with fruit pulp)                                     milk, soups, orange juice  Iced Popsicles                                    All solid food Carbonated beverages, regular and diet                                    Cranberry, grape and apple juices Sports drinks like Gatorade Lightly seasoned clear broth or consume(fat free) Sugar, honey syrup  Sample Menu Breakfast                                Lunch                                     Supper Cranberry juice                    Beef broth                            Chicken broth Jell-O                                     Grape juice  Apple juice Coffee or tea                        Jell-O                                      Popsicle                                                Coffee or tea                        Coffee or tea  _____________________________________________________________________     Take these medicines the morning of surgery with A SIP OF WATER: nebulizer if needed, Inhalers as usual and bring, Eye drops as usual, Hydrocodone if needed, Nebivolol, Azelastine nasal spray                                  You may not have any metal on your body including hair pins and              piercings  Do not wear jewelry, , lotions, powders or perfumes, deodorant                        Men may shave face and neck.   Do not bring valuables to the hospital. [facility] IS NOT             RESPONSIBLE   FOR VALUABLES.  Contacts, dentures or bridgework may not be worn into surgery.  Leave suitcase in the car. After surgery it may be brought to your room.                       Please read over the following fact sheets you were given: _____________________________________________________________________             [facility] - Preparing for Surgery Before surgery, you can play an important role.  Because skin is not sterile, your skin needs to be as free of germs as possible.  You can reduce the number of germs on your skin by washing with CHG (chlorahexidine gluconate) soap before surgery.  CHG is an antiseptic cleaner which kills germs and bonds with the skin to continue killing germs even after washing. Please DO NOT use if you have an allergy to CHG or antibacterial soaps.  If your skin becomes reddened/irritated stop using the CHG and inform your nurse when you arrive at Short Stay. Do not shave (including legs and underarms) for at least 48 hours prior to the first CHG shower.  You may shave your face/neck. Please follow these instructions carefully:  1.  Shower with CHG Soap the night before surgery and the  morning of Surgery.  2.  If you choose to wash your hair, wash your hair first as usual with your  normal  shampoo.  3.  After you shampoo, rinse your hair and body thoroughly to remove the  shampoo.                           4.  Use CHG as you would any other liquid soap.  You can apply chg directly  to the skin and wash                       Gently with a scrungie or clean washcloth.  5.  Apply the CHG Soap to your body ONLY FROM THE NECK DOWN.   Do not use on face/ open                            Wound or open sores. Avoid contact with eyes, ears mouth and genitals (private parts).                       Wash face,  Genitals (private parts) with your normal soap.             6.  Wash thoroughly, paying special attention to the area where your surgery  will be performed.  7.  Thoroughly rinse your body with warm water from the neck down.  8.  DO NOT shower/wash with your normal soap after using and rinsing off  the CHG Soap.                9.  Pat yourself dry with a clean towel.            10.  Wear clean pajamas.            11.  Place clean sheets on your bed the night of your first shower and do not  sleep with pets. Day of Surgery : Do not apply any lotions/deodorants the morning of surgery.  Please wear clean clothes to the hospital/surgery center.  FAILURE TO FOLLOW THESE INSTRUCTIONS MAY RESULT IN THE CANCELLATION OF YOUR SURGERY PATIENT SIGNATURE_________________________________  NURSE SIGNATURE__________________________________  ________________________________________________________________________  WHAT IS A BLOOD TRANSFUSION? Blood Transfusion Information  A transfusion is the replacement of blood or some of its parts. Blood is made up of multiple cells which provide different functions.  Red blood cells carry oxygen and are used for blood loss replacement.  White blood cells fight against infection.  Platelets control bleeding.  Plasma helps clot blood.  Other blood products are available for specialized needs, such as hemophilia or other clotting disorders. BEFORE THE TRANSFUSION  Who gives blood for transfusions?   Healthy volunteers who are fully evaluated to make sure their blood is safe. This is blood bank blood. Transfusion therapy is the safest it has ever been in the practice of medicine. Before blood is taken from a donor, a complete history is taken to make sure that person has no history of diseases nor engages in risky social behavior  (examples are intravenous drug use or sexual activity with multiple partners). The donor's travel history is screened to minimize risk of transmitting infections, such as malaria. The donated blood is tested for signs of infectious diseases, such as HIV and hepatitis. The blood is then tested to be sure it is compatible with you in order to minimize the chance of a transfusion reaction. If you or a relative donates blood, this is often done in anticipation of surgery and is not appropriate for emergency situations. It takes many days to process the donated blood. RISKS AND COMPLICATIONS Although transfusion therapy is very safe and saves many lives, the main dangers of transfusion include:   Getting an infectious disease.  Developing a transfusion reaction. This is  an allergic reaction to something in the blood you were given. Every precaution is taken to prevent this. The decision to have a blood transfusion has been considered carefully by your caregiver before blood is given. Blood is not given unless the benefits outweigh the risks. AFTER THE TRANSFUSION  Right after receiving a blood transfusion, you will usually feel much better and more energetic. This is especially true if your red blood cells have gotten low (anemic). The transfusion raises the level of the red blood cells which carry oxygen, and this usually causes an energy increase.  The nurse administering the transfusion will monitor you carefully for complications. HOME CARE INSTRUCTIONS  No special instructions are needed after a transfusion. You may find your energy is better. Speak with your caregiver about any limitations on activity for underlying diseases you may have. SEEK MEDICAL CARE IF:   Your condition is not improving after your transfusion.  You develop redness or irritation at the intravenous (IV) site. SEEK IMMEDIATE MEDICAL CARE IF:  Any of the following symptoms occur over the next 12 hours:  Shaking  chills.  You have a temperature by mouth above 102 F (38.9 C), not controlled by medicine.  Chest, back, or muscle pain.  People around you feel you are not acting correctly or are confused.  Shortness of breath or difficulty breathing.  Dizziness and fainting.  You get a rash or develop hives.  You have a decrease in urine output.  Your urine turns a dark color or changes to pink, red, or brown. Any of the following symptoms occur over the next 10 days:  You have a temperature by mouth above 102 F (38.9 C), not controlled by medicine.  Shortness of breath.  Weakness after normal activity.  The white part of the eye turns yellow (jaundice).  You have a decrease in the amount of urine or are urinating less often.  Your urine turns a dark color or changes to pink, red, or brown. Document Released: 09/17/2000 Document Revised: 12/13/2011 Document Reviewed: 05/06/2008 ExitCare Patient Information 2014 [place], [place].  _______________________________________________________________________  Incentive Spirometer  An incentive spirometer is a tool that can help keep your lungs clear and active. This tool measures how well you are filling your lungs with each breath. Taking long deep breaths may help reverse or decrease the chance of developing breathing (pulmonary) problems (especially infection) following:  A long period of time when you are unable to move or be active. BEFORE THE PROCEDURE   If the spirometer includes an indicator to show your best effort, your nurse or respiratory therapist will set it to a desired goal.  If possible, sit up straight or lean slightly forward. Try not to slouch.  Hold the incentive spirometer in an upright position. INSTRUCTIONS FOR USE  1. Sit on the edge of your bed if possible, or sit up as far as you can in bed or on a chair. 2. Hold the incentive spirometer in an upright position. 3. Breathe out normally. 4. Place the  mouthpiece in your mouth and seal your lips tightly around it. 5. Breathe in slowly and as deeply as possible, raising the piston or the ball toward the top of the column. 6. Hold your breath for 3-5 seconds or for as long as possible. Allow the piston or ball to fall to the bottom of the column. 7. Remove the mouthpiece from your mouth and breathe out normally. 8. Rest for a few seconds and repeat Steps 1 through  7 at least 10 times every 1-2 hours when you are awake. Take your time and take a few normal breaths between deep breaths. 9. The spirometer may include an indicator to show your best effort. Use the indicator as a goal to work toward during each repetition. 10. After each set of 10 deep breaths, practice coughing to be sure your lungs are clear. If you have an incision (the cut made at the time of surgery), support your incision when coughing by placing a pillow or rolled up towels firmly against it. Once you are able to get out of bed, walk around indoors and cough well. You may stop using the incentive spirometer when instructed by your caregiver.  RISKS AND COMPLICATIONS  Take your time so you do not get dizzy or light-headed.  If you are in pain, you may need to take or ask for pain medication before doing incentive spirometry. It is harder to take a deep breath if you are having pain. AFTER USE  Rest and breathe slowly and easily.  It can be helpful to keep track of a log of your progress. Your caregiver can provide you with a simple table to help with this. If you are using the spirometer at home, follow these instructions: SEEK MEDICAL CARE IF:   You are having difficultly using the spirometer.  You have trouble using the spirometer as often as instructed.  Your pain medication is not giving enough relief while using the spirometer.  You develop fever of 100.5 F (38.1 C) or higher. SEEK IMMEDIATE MEDICAL CARE IF:   You cough up bloody sputum that had not been present  before.  You develop fever of 102 F (38.9 C) or greater.  You develop worsening pain at or near the incision site. MAKE SURE YOU:   Understand these instructions.  Will watch your condition.  Will get help right away if you are not doing well or get worse. Document Released: 01/31/2007 Document Revised: 12/13/2011 Document Reviewed: 04/03/2007 [facility] Patient Information 2014 [place], [place].   ________________________________________________________________________

## 2018-11-03 MED FILL — SYMBICORT 160-4.5 MCG INH: 160-4.5 | 30 days supply | Qty: 10 | Fill #1

## 2018-11-03 MED FILL — XOLAIR 150 MG SOLR: 150 | 28 days supply | Qty: 6 | Fill #1

## 2018-11-06 ENCOUNTER — Ambulatory Visit (INDEPENDENT_AMBULATORY_CARE_PROVIDER_SITE_OTHER): Payer: 59 | Admitting: *Deleted

## 2018-11-06 ENCOUNTER — Ambulatory Visit: Payer: 59 | Admitting: Allergy & Immunology

## 2018-11-06 ENCOUNTER — Telehealth (INDEPENDENT_AMBULATORY_CARE_PROVIDER_SITE_OTHER): Payer: Self-pay | Admitting: Orthopaedic Surgery

## 2018-11-06 DIAGNOSIS — J4550 Severe persistent asthma, uncomplicated: Secondary | ICD-10-CM

## 2018-11-06 NOTE — Telephone Encounter (Signed)
Patient is having surgery 2/7, he wants to know what day he will be taken oow. Callback [phone]

## 2018-11-06 NOTE — Telephone Encounter (Signed)
LMOM for patient letting him know the below message and to call back and let me know how to write his note

## 2018-11-06 NOTE — Telephone Encounter (Signed)
We can take him out at any time he wants.

## 2018-11-06 NOTE — Telephone Encounter (Signed)
See below, do you want to take him out before surgery or is it fine to work?

## 2018-11-07 ENCOUNTER — Other Ambulatory Visit: Payer: Self-pay | Admitting: *Deleted

## 2018-11-07 ENCOUNTER — Encounter (INDEPENDENT_AMBULATORY_CARE_PROVIDER_SITE_OTHER): Payer: Self-pay

## 2018-11-07 ENCOUNTER — Telehealth (INDEPENDENT_AMBULATORY_CARE_PROVIDER_SITE_OTHER): Payer: Self-pay | Admitting: Orthopaedic Surgery

## 2018-11-07 ENCOUNTER — Ambulatory Visit: Payer: 59

## 2018-11-07 NOTE — Telephone Encounter (Signed)
Patient aware note is at front desk 

## 2018-11-07 NOTE — Patient Outreach (Addendum)
[facility]) Care Management  11/07/2018  Mana Reuter [birth year] [id number]   Preoperative Screening Call Referral received: 11/07/18 Scheduled surgery date:  11/10/18 Insurance: [facility] Choice Plan  Subjective:  Initial successful telephone call to patient's preferred number in order to complete preoperative screening. 2 HIPAA identifiers verified. Discussed purpose of preoperative call. Patient voices understanding and agrees to call. Mr Hartunian  states he understands the reason for his surgery and the expected time of arrival. He says he expects to be in the hospital 2 days. He states he has completed medical leave paperwork and short term disability paperwork. Says he does not have the hospital indemnity benefit. He says his wife will be able to help him 2 days of the week and will have some assistance from his 76 year old daughter and 96 year old stepson. He says he received the paperwork on advanced directives and intends to complete them before his surgery.  He agrees to a post hospital discharge transition of care call.    Objective:  Per chart review, patient is scheduled for right total hip arthroplasty using anterior approach on 11/10/18 at [facility]. Comorbidities include: HTN, asthma (since childhood) , sickle cell, and sarcoidosis  Assessment: Preoperative call completed, no preoperative needs identified.   Plan: Answered Mr Guerreiro's questions regarding the usual DME issued after hip surgery and encouraged him to ask for a urinal and 3 in 1 since he is voicing concern about getting to the bathroom quickly. RNCM will call patient for transition of care outreach within 72 hours of hospital discharge notification.  Bary Richard RN,CCM,CDE [facility] Care Management Coordinator Office Phone [phone] Office Fax [phone]

## 2018-11-07 NOTE — Telephone Encounter (Signed)
Patient called asked if he can get a letter stating how long  He will be out of work after surgery? Patient said his employer need the information. Patient asked if he can pick up letter today? The number to contact patient is [phone]

## 2018-11-10 ENCOUNTER — Other Ambulatory Visit: Payer: Self-pay

## 2018-11-10 ENCOUNTER — Encounter (HOSPITAL_COMMUNITY)
Admission: RE | Disposition: A | Discharge status: Home Health | Payer: Self-pay | Source: Home / Self Care | Attending: Orthopaedic Surgery

## 2018-11-10 ENCOUNTER — Encounter (HOSPITAL_COMMUNITY): Payer: Self-pay | Admitting: Emergency Medicine

## 2018-11-10 ENCOUNTER — Inpatient Hospital Stay (HOSPITAL_COMMUNITY): Payer: 59

## 2018-11-10 ENCOUNTER — Inpatient Hospital Stay (HOSPITAL_COMMUNITY): Payer: 59 | Admitting: Certified Registered Nurse Anesthetist

## 2018-11-10 ENCOUNTER — Inpatient Hospital Stay (HOSPITAL_COMMUNITY)
Admission: RE | Admit: 2018-11-10 | Discharge: 2018-11-12 | DRG: 470 | Disposition: A | Discharge status: Home Health | Payer: 59 | Attending: Orthopaedic Surgery | Admitting: Orthopaedic Surgery

## 2018-11-10 DIAGNOSIS — Z7951 Long term (current) use of inhaled steroids: Secondary | ICD-10-CM | POA: Diagnosis not present

## 2018-11-10 DIAGNOSIS — M879 Osteonecrosis, unspecified: Secondary | ICD-10-CM | POA: Diagnosis present

## 2018-11-10 DIAGNOSIS — M87051 Idiopathic aseptic necrosis of right femur: Secondary | ICD-10-CM | POA: Diagnosis present

## 2018-11-10 DIAGNOSIS — M25551 Pain in right hip: Secondary | ICD-10-CM | POA: Diagnosis present

## 2018-11-10 DIAGNOSIS — I10 Essential (primary) hypertension: Secondary | ICD-10-CM | POA: Diagnosis present

## 2018-11-10 DIAGNOSIS — J4550 Severe persistent asthma, uncomplicated: Secondary | ICD-10-CM | POA: Diagnosis present

## 2018-11-10 DIAGNOSIS — G8929 Other chronic pain: Secondary | ICD-10-CM | POA: Diagnosis present

## 2018-11-10 DIAGNOSIS — Z96641 Presence of right artificial hip joint: Secondary | ICD-10-CM | POA: Diagnosis not present

## 2018-11-10 DIAGNOSIS — Z471 Aftercare following joint replacement surgery: Secondary | ICD-10-CM | POA: Diagnosis not present

## 2018-11-10 DIAGNOSIS — Z79899 Other long term (current) drug therapy: Secondary | ICD-10-CM

## 2018-11-10 DIAGNOSIS — Z419 Encounter for procedure for purposes other than remedying health state, unspecified: Secondary | ICD-10-CM

## 2018-11-10 DIAGNOSIS — D571 Sickle-cell disease without crisis: Secondary | ICD-10-CM | POA: Diagnosis present

## 2018-11-10 DIAGNOSIS — Z87891 Personal history of nicotine dependence: Secondary | ICD-10-CM

## 2018-11-10 DIAGNOSIS — M87851 Other osteonecrosis, right femur: Secondary | ICD-10-CM | POA: Diagnosis not present

## 2018-11-10 HISTORY — PX: TOTAL HIP ARTHROPLASTY: SHX124

## 2018-11-10 SURGERY — ARTHROPLASTY, HIP, TOTAL, ANTERIOR APPROACH
Anesthesia: Spinal | Laterality: Right

## 2018-11-10 MED ORDER — LACTATED RINGERS IV SOLN
INTRAVENOUS | Status: DC
  Administered 2018-11-10 (×2): via INTRAVENOUS

## 2018-11-10 MED ORDER — DOCUSATE SODIUM 100 MG PO CAPS
100.0000 mg | ORAL_CAPSULE | Freq: Two times a day (BID) | ORAL | Status: DC
  Administered 2018-11-10 – 2018-11-12 (×4): 100 mg via ORAL
  Filled 2018-11-10 (×4): qty 1

## 2018-11-10 MED ORDER — ONDANSETRON HCL 4 MG PO TABS: 4.0000 mg | ORAL_TABLET | Freq: Four times a day (QID) | ORAL | Status: DC | PRN

## 2018-11-10 MED ORDER — MENTHOL 3 MG MT LOZG: 1.0000 | LOZENGE | OROMUCOSAL | Status: DC | PRN

## 2018-11-10 MED ORDER — PHENOL 1.4 % MT LIQD
1.0000 | OROMUCOSAL | Status: DC | PRN
  Filled 2018-11-10: qty 177

## 2018-11-10 MED ORDER — ONDANSETRON HCL 4 MG/2ML IJ SOLN
INTRAMUSCULAR | Status: DC | PRN
  Administered 2018-11-10: 4 mg via INTRAVENOUS

## 2018-11-10 MED ORDER — CHLORHEXIDINE GLUCONATE 4 % EX LIQD: 60.0000 mL | Freq: Once | CUTANEOUS | Status: DC

## 2018-11-10 MED ORDER — POLYETHYLENE GLYCOL 3350 17 G PO PACK: 17.0000 g | PACK | Freq: Every day | ORAL | Status: DC | PRN

## 2018-11-10 MED ORDER — SODIUM CHLORIDE 0.9 % IR SOLN
Status: DC | PRN
  Administered 2018-11-10: 1000 mL

## 2018-11-10 MED ORDER — CEFAZOLIN SODIUM-DEXTROSE 1-4 GM/50ML-% IV SOLN
1.0000 g | Freq: Four times a day (QID) | INTRAVENOUS | Status: AC
  Administered 2018-11-10 – 2018-11-11 (×2): 1 g via INTRAVENOUS
  Filled 2018-11-10 (×2): qty 50

## 2018-11-10 MED ORDER — NEBIVOLOL HCL 5 MG PO TABS
5.0000 mg | ORAL_TABLET | Freq: Every day | ORAL | Status: DC
  Administered 2018-11-11 – 2018-11-12 (×2): 5 mg via ORAL
  Filled 2018-11-10 (×2): qty 1

## 2018-11-10 MED ORDER — MIDAZOLAM HCL 5 MG/5ML IJ SOLN
INTRAMUSCULAR | Status: DC | PRN
  Administered 2018-11-10: 2 mg via INTRAVENOUS

## 2018-11-10 MED ORDER — HYDROMORPHONE HCL 1 MG/ML IJ SOLN
0.2500 mg | INTRAMUSCULAR | Status: DC | PRN
  Administered 2018-11-10 (×4): 0.5 mg via INTRAVENOUS

## 2018-11-10 MED ORDER — ALBUTEROL SULFATE (2.5 MG/3ML) 0.083% IN NEBU: 2.5000 mg | INHALATION_SOLUTION | RESPIRATORY_TRACT | Status: DC | PRN

## 2018-11-10 MED ORDER — HYDROMORPHONE HCL 1 MG/ML IJ SOLN
INTRAMUSCULAR | Status: AC
  Filled 2018-11-10: qty 1

## 2018-11-10 MED ORDER — OXYCODONE HCL 5 MG PO TABS
10.0000 mg | ORAL_TABLET | ORAL | Status: DC | PRN
  Administered 2018-11-11 – 2018-11-12 (×11): 15 mg via ORAL
  Filled 2018-11-10 (×11): qty 3

## 2018-11-10 MED ORDER — ASPIRIN 81 MG PO CHEW: 81.0000 mg | CHEWABLE_TABLET | Freq: Two times a day (BID) | ORAL | 0 refills | Status: AC

## 2018-11-10 MED ORDER — ACETAMINOPHEN 325 MG PO TABS: 325.0000 mg | ORAL_TABLET | Freq: Once | ORAL | Status: DC

## 2018-11-10 MED ORDER — PHENYLEPHRINE 40 MCG/ML (10ML) SYRINGE FOR IV PUSH (FOR BLOOD PRESSURE SUPPORT)
PREFILLED_SYRINGE | INTRAVENOUS | Status: AC
  Filled 2018-11-10: qty 10

## 2018-11-10 MED ORDER — ACETAMINOPHEN 10 MG/ML IV SOLN
1000.0000 mg | Freq: Once | INTRAVENOUS | Status: DC | PRN
  Administered 2018-11-10: 1000 mg via INTRAVENOUS

## 2018-11-10 MED ORDER — ACETAMINOPHEN 10 MG/ML IV SOLN
INTRAVENOUS | Status: AC
  Filled 2018-11-10: qty 100

## 2018-11-10 MED ORDER — MOMETASONE FURO-FORMOTEROL FUM 200-5 MCG/ACT IN AERO
2.0000 | INHALATION_SPRAY | Freq: Two times a day (BID) | RESPIRATORY_TRACT | Status: DC
  Administered 2018-11-10 – 2018-11-11 (×3): 2 via RESPIRATORY_TRACT
  Filled 2018-11-10 (×2): qty 8.8

## 2018-11-10 MED ORDER — METHOCARBAMOL 500 MG PO TABS
500.0000 mg | ORAL_TABLET | Freq: Four times a day (QID) | ORAL | Status: DC | PRN
  Administered 2018-11-10 – 2018-11-12 (×5): 500 mg via ORAL
  Filled 2018-11-10 (×6): qty 1

## 2018-11-10 MED ORDER — PHENYLEPHRINE 40 MCG/ML (10ML) SYRINGE FOR IV PUSH (FOR BLOOD PRESSURE SUPPORT)
PREFILLED_SYRINGE | INTRAVENOUS | Status: DC | PRN
  Administered 2018-11-10: 80 ug via INTRAVENOUS
  Administered 2018-11-10 (×2): 40 ug via INTRAVENOUS
  Administered 2018-11-10: 80 ug via INTRAVENOUS
  Administered 2018-11-10: 40 ug via INTRAVENOUS
  Administered 2018-11-10: 80 ug via INTRAVENOUS

## 2018-11-10 MED ORDER — METHOCARBAMOL 500 MG IVPB - SIMPLE MED
500.0000 mg | Freq: Four times a day (QID) | INTRAVENOUS | Status: DC | PRN
  Administered 2018-11-10: 500 mg via INTRAVENOUS
  Filled 2018-11-10: qty 50

## 2018-11-10 MED ORDER — FENTANYL CITRATE (PF) 100 MCG/2ML IJ SOLN
INTRAMUSCULAR | Status: DC | PRN
  Administered 2018-11-10: 50 ug via INTRAVENOUS

## 2018-11-10 MED ORDER — PROPOFOL 500 MG/50ML IV EMUL
INTRAVENOUS | Status: DC | PRN
  Administered 2018-11-10: 4 ug/kg/min via INTRAVENOUS

## 2018-11-10 MED ORDER — MEPERIDINE HCL 50 MG/ML IJ SOLN: 6.2500 mg | INTRAMUSCULAR | Status: DC | PRN

## 2018-11-10 MED ORDER — ALBUTEROL SULFATE HFA 108 (90 BASE) MCG/ACT IN AERS: 2.0000 | INHALATION_SPRAY | Freq: Four times a day (QID) | RESPIRATORY_TRACT | Status: DC | PRN

## 2018-11-10 MED ORDER — ONDANSETRON HCL 4 MG/2ML IJ SOLN
4.0000 mg | Freq: Four times a day (QID) | INTRAMUSCULAR | Status: DC | PRN
  Administered 2018-11-11: 4 mg via INTRAVENOUS
  Filled 2018-11-10: qty 2

## 2018-11-10 MED ORDER — METOCLOPRAMIDE HCL 5 MG/ML IJ SOLN: 5.0000 mg | Freq: Three times a day (TID) | INTRAMUSCULAR | Status: DC | PRN

## 2018-11-10 MED ORDER — ACETAMINOPHEN 160 MG/5ML PO SOLN: 325.0000 mg | Freq: Once | ORAL | Status: DC

## 2018-11-10 MED ORDER — SODIUM CHLORIDE 0.9 % IV SOLN
INTRAVENOUS | Status: DC
  Administered 2018-11-10 – 2018-11-11 (×2): via INTRAVENOUS

## 2018-11-10 MED ORDER — MIDAZOLAM HCL 2 MG/2ML IJ SOLN
INTRAMUSCULAR | Status: AC
  Filled 2018-11-10: qty 2

## 2018-11-10 MED ORDER — MONTELUKAST SODIUM 10 MG PO TABS: 10.0000 mg | ORAL_TABLET | Freq: Every evening | ORAL | Status: DC | PRN

## 2018-11-10 MED ORDER — DEXAMETHASONE SODIUM PHOSPHATE 10 MG/ML IJ SOLN
INTRAMUSCULAR | Status: DC | PRN
  Administered 2018-11-10: 8 mg via INTRAVENOUS

## 2018-11-10 MED ORDER — ACETAMINOPHEN 325 MG PO TABS
325.0000 mg | ORAL_TABLET | Freq: Four times a day (QID) | ORAL | Status: DC | PRN
  Administered 2018-11-11: 650 mg via ORAL
  Filled 2018-11-10: qty 2

## 2018-11-10 MED ORDER — FENTANYL CITRATE (PF) 100 MCG/2ML IJ SOLN
INTRAMUSCULAR | Status: AC
  Filled 2018-11-10: qty 2

## 2018-11-10 MED ORDER — AZELASTINE HCL 0.15 % NA SOLN: 2.0000 | Freq: Two times a day (BID) | NASAL | Status: DC | PRN

## 2018-11-10 MED ORDER — METHOCARBAMOL 500 MG IVPB - SIMPLE MED
INTRAVENOUS | Status: AC
  Filled 2018-11-10: qty 50

## 2018-11-10 MED ORDER — EPHEDRINE 5 MG/ML INJ
INTRAVENOUS | Status: AC
  Filled 2018-11-10: qty 10

## 2018-11-10 MED ORDER — LACTATED RINGERS IV SOLN: INTRAVENOUS | Status: DC

## 2018-11-10 MED ORDER — OXYCODONE HCL 5 MG PO TABS
5.0000 mg | ORAL_TABLET | ORAL | Status: DC | PRN
  Administered 2018-11-10: 10 mg via ORAL
  Filled 2018-11-10: qty 2

## 2018-11-10 MED ORDER — HYDROMORPHONE HCL 1 MG/ML IJ SOLN
0.5000 mg | INTRAMUSCULAR | Status: DC | PRN
  Administered 2018-11-10 – 2018-11-11 (×2): 1 mg via INTRAVENOUS
  Filled 2018-11-10 (×2): qty 1

## 2018-11-10 MED ORDER — AZELASTINE HCL 0.1 % NA SOLN
2.0000 | Freq: Two times a day (BID) | NASAL | Status: DC | PRN
  Filled 2018-11-10: qty 30

## 2018-11-10 MED ORDER — PROPOFOL 10 MG/ML IV BOLUS
INTRAVENOUS | Status: DC | PRN
  Administered 2018-11-10: 20 mg via INTRAVENOUS
  Administered 2018-11-10: 10 mg via INTRAVENOUS

## 2018-11-10 MED ORDER — TRANEXAMIC ACID-NACL 1000-0.7 MG/100ML-% IV SOLN
1000.0000 mg | INTRAVENOUS | Status: AC
  Administered 2018-11-10: 1000 mg via INTRAVENOUS
  Filled 2018-11-10: qty 100

## 2018-11-10 MED ORDER — METHOCARBAMOL 500 MG PO TABS: 500.0000 mg | ORAL_TABLET | Freq: Four times a day (QID) | ORAL | 0 refills | Status: DC | PRN

## 2018-11-10 MED ORDER — ASPIRIN 81 MG PO CHEW
81.0000 mg | CHEWABLE_TABLET | Freq: Two times a day (BID) | ORAL | Status: DC
  Administered 2018-11-10 – 2018-11-12 (×4): 81 mg via ORAL
  Filled 2018-11-10 (×4): qty 1

## 2018-11-10 MED ORDER — METOCLOPRAMIDE HCL 5 MG PO TABS: 5.0000 mg | ORAL_TABLET | Freq: Three times a day (TID) | ORAL | Status: DC | PRN

## 2018-11-10 MED ORDER — PANTOPRAZOLE SODIUM 40 MG PO TBEC
40.0000 mg | DELAYED_RELEASE_TABLET | Freq: Every day | ORAL | Status: DC
  Administered 2018-11-10 – 2018-11-12 (×3): 40 mg via ORAL
  Filled 2018-11-10 (×3): qty 1

## 2018-11-10 MED ORDER — EPHEDRINE SULFATE-NACL 50-0.9 MG/10ML-% IV SOSY
PREFILLED_SYRINGE | INTRAVENOUS | Status: DC | PRN
  Administered 2018-11-10: 5 mg via INTRAVENOUS
  Administered 2018-11-10: 10 mg via INTRAVENOUS

## 2018-11-10 MED ORDER — CEFAZOLIN SODIUM-DEXTROSE 2-4 GM/100ML-% IV SOLN
2.0000 g | INTRAVENOUS | Status: AC
  Administered 2018-11-10: 2 g via INTRAVENOUS
  Filled 2018-11-10: qty 100

## 2018-11-10 MED ORDER — ONDANSETRON HCL 4 MG/2ML IJ SOLN
INTRAMUSCULAR | Status: AC
  Filled 2018-11-10: qty 2

## 2018-11-10 MED ORDER — ZOLPIDEM TARTRATE 5 MG PO TABS: 5.0000 mg | ORAL_TABLET | Freq: Every evening | ORAL | Status: DC | PRN

## 2018-11-10 MED ORDER — PROMETHAZINE HCL 25 MG/ML IJ SOLN: 6.2500 mg | INTRAMUSCULAR | Status: DC | PRN

## 2018-11-10 MED ORDER — OXYCODONE HCL 5 MG PO TABS: 5.0000 mg | ORAL_TABLET | ORAL | 0 refills | Status: DC | PRN

## 2018-11-10 MED ORDER — ALUM & MAG HYDROXIDE-SIMETH 200-200-20 MG/5ML PO SUSP: 30.0000 mL | ORAL | Status: DC | PRN

## 2018-11-10 MED ORDER — PROPOFOL 10 MG/ML IV BOLUS
INTRAVENOUS | Status: AC
  Filled 2018-11-10: qty 60

## 2018-11-10 MED FILL — oxyCODONE HCL 5 MG TABS: 5 | 3 days supply | Qty: 40 | Fill #0

## 2018-11-10 MED FILL — ASPIRIN LOW DOSE 81 MG CHEW: 81 | 15 days supply | Qty: 30 | Fill #0

## 2018-11-10 MED FILL — METHOCARBAMOL 500 MG TABLET: 500 | 10 days supply | Qty: 40 | Fill #0

## 2018-11-10 SURGICAL SUPPLY — 38 items
APL SKNCLS STERI-STRIP NONHPOA (GAUZE/BANDAGES/DRESSINGS)
BAG SPEC THK2 15X12 ZIP CLS (MISCELLANEOUS)
BAG ZIPLOCK 12X15 (MISCELLANEOUS) IMPLANT
BENZOIN TINCTURE PRP APPL 2/3 (GAUZE/BANDAGES/DRESSINGS) IMPLANT
BLADE SAW SGTL 18X1.27X75 (BLADE) ×2 IMPLANT
BLADE SURG SZ10 CARB STEEL (BLADE) ×4 IMPLANT
COVER PERINEAL POST (MISCELLANEOUS) ×2 IMPLANT
COVER SURGICAL LIGHT HANDLE (MISCELLANEOUS) ×2 IMPLANT
COVER WAND RF STERILE (DRAPES) IMPLANT
DRAPE STERI IOBAN 125X83 (DRAPES) ×2 IMPLANT
DRAPE U-SHAPE 47X51 STRL (DRAPES) ×4 IMPLANT
DRSG AQUACEL AG ADV 3.5X10 (GAUZE/BANDAGES/DRESSINGS) ×2 IMPLANT
DURAPREP 26ML APPLICATOR (WOUND CARE) ×2 IMPLANT
ELECT REM PT RETURN 15FT ADLT (MISCELLANEOUS) ×2 IMPLANT
GAUZE XEROFORM 1X8 LF (GAUZE/BANDAGES/DRESSINGS) IMPLANT
GLOVE BIO SURGEON STRL SZ7.5 (GLOVE) ×2 IMPLANT
GLOVE BIOGEL PI IND STRL 8 (GLOVE) ×2 IMPLANT
GLOVE BIOGEL PI INDICATOR 8 (GLOVE) ×2
GLOVE ECLIPSE 8.0 STRL XLNG CF (GLOVE) ×2 IMPLANT
GOWN STRL REUS W/TWL XL LVL3 (GOWN DISPOSABLE) ×4 IMPLANT
HANDPIECE INTERPULSE COAX TIP (DISPOSABLE) ×2
HEAD CERAMIC 36 PLUS5 (Hips) ×1 IMPLANT
HOLDER FOLEY CATH W/STRAP (MISCELLANEOUS) ×2 IMPLANT
LINER ACETAB NEUTRAL 36ID 520D (Liner) ×1 IMPLANT
PACK ANTERIOR HIP CUSTOM (KITS) ×2 IMPLANT
PIN SECTOR W/GRIP ACE CUP 52MM (Hips) ×1 IMPLANT
SET HNDPC FAN SPRY TIP SCT (DISPOSABLE) ×1 IMPLANT
STAPLER VISISTAT 35W (STAPLE) IMPLANT
STEM CORAIL KA12 (Stem) ×1 IMPLANT
STRIP CLOSURE SKIN 1/2X4 (GAUZE/BANDAGES/DRESSINGS) IMPLANT
SUT ETHIBOND NAB CT1 #1 30IN (SUTURE) ×2 IMPLANT
SUT MNCRL AB 4-0 PS2 18 (SUTURE) IMPLANT
SUT VIC AB 0 CT1 36 (SUTURE) ×2 IMPLANT
SUT VIC AB 1 CT1 36 (SUTURE) ×2 IMPLANT
SUT VIC AB 2-0 CT1 27 (SUTURE) ×4
SUT VIC AB 2-0 CT1 TAPERPNT 27 (SUTURE) ×2 IMPLANT
TRAY FOLEY MTR SLVR 16FR STAT (SET/KITS/TRAYS/PACK) ×2 IMPLANT
YANKAUER SUCT BULB TIP 10FT TU (MISCELLANEOUS) ×2 IMPLANT

## 2018-11-10 NOTE — Anesthesia Postprocedure Evaluation (Signed)
Anesthesia Post Note  Patient: [name]  Procedure(s) Performed: RIGHT TOTAL HIP ARTHROPLASTY ANTERIOR APPROACH (Right )     Patient location during evaluation: PACU Anesthesia Type: Spinal Level of consciousness: oriented and awake and alert Pain management: pain level controlled Vital Signs Assessment: post-procedure vital signs reviewed and stable Respiratory status: spontaneous breathing, respiratory function stable and patient connected to nasal cannula oxygen Cardiovascular status: blood pressure returned to baseline and stable Postop Assessment: no headache, no backache, no apparent nausea or vomiting, spinal receding and patient able to bend at knees Anesthetic complications: no    Last Vitals:  Vitals:   11/10/18 1630 11/10/18 1653  BP: (!) 131/91 (!) 130/92  Pulse:  74  Resp:  11  Temp:    SpO2:  100%                   Shelton Silvas

## 2018-11-10 NOTE — Evaluation (Signed)
Physical Therapy Evaluation Patient Details Name: [name] MRN: [id number] DOB: Dec 09, 1965 Today's Date: 11/10/2018   History of Present Illness  Pt s/p R THR 2* AVN  Clinical Impression  Pt s/p R THR and presents with decreased R LE strength/ROM and post op pain limiting functional mobility.  Pt should progress to dc home with family assist.    Follow Up Recommendations Home health PT;Follow surgeon's recommendation for DC plan and follow-up therapies    Equipment Recommendations  None recommended by PT    Recommendations for Other Services OT consult     Precautions / Restrictions Precautions Precautions: Fall Restrictions Weight Bearing Restrictions: No Other Position/Activity Restrictions: WBAT      Mobility  Bed Mobility Overal bed mobility: Needs Assistance Bed Mobility: Supine to Sit;Sit to Supine     Supine to sit: Mod assist;+2 for physical assistance;+2 for safety/equipment Sit to supine: Mod assist;+2 for physical assistance;+2 for safety/equipment   General bed mobility comments: cues for sequence with assist to manage LEs and to control trunk  Transfers                 General transfer comment: NT pt fatigued and with spinal not completely resolved  Ambulation/Gait                Stairs            Wheelchair Mobility    Modified Rankin (Stroke Patients Only)       Balance                                             Pertinent Vitals/Pain Pain Assessment: 0-10 Pain Score: 4  Pain Location: R hip Pain Descriptors / Indicators: Aching;Sore Pain Intervention(s): Monitored during session;Limited activity within patient's tolerance;Premedicated before session;Ice applied    Home Living Family/patient expects to be discharged to:: Private residence Living Arrangements: Spouse/significant other Available Help at Discharge: Family Type of Home: House Home Access: Stairs to enter Entrance Stairs-Rails:  None Secretary/administratorntrance Stairs-Number of Steps: 2 Home Layout: One level        Prior Function Level of Independence: Independent               Hand Dominance        Extremity/Trunk Assessment   Upper Extremity Assessment Upper Extremity Assessment: Overall WFL for tasks assessed    Lower Extremity Assessment Lower Extremity Assessment: RLE deficits/detail    Cervical / Trunk Assessment Cervical / Trunk Assessment: Normal  Communication   Communication: No difficulties  Cognition Arousal/Alertness: Awake/alert Behavior During Therapy: WFL for tasks assessed/performed Overall Cognitive Status: Within Functional Limits for tasks assessed                                        General Comments      Exercises     Assessment/Plan    PT Assessment Patient needs continued PT services  PT Problem List Decreased strength;Decreased range of motion;Decreased activity tolerance;Decreased mobility;Decreased knowledge of use of DME;Pain       PT Treatment Interventions DME instruction;Gait training;Stair training;Functional mobility training;Therapeutic activities;Therapeutic exercise;Patient/family education    PT Goals (Current goals can be found in the Care Plan section)  Acute Rehab PT Goals Patient Stated Goal: Regain IND PT Goal Formulation: With patient  Time For Goal Achievement: 11/17/18 Potential to Achieve Goals: Good    Frequency 7X/week   Barriers to discharge        Co-evaluation               AM-PAC PT "6 Clicks" Mobility  Outcome Measure Help needed turning from your back to your side while in a flat bed without using bedrails?: A Lot Help needed moving from lying on your back to sitting on the side of a flat bed without using bedrails?: A Lot Help needed moving to and from a bed to a chair (including a wheelchair)?: A Lot Help needed standing up from a chair using your arms (e.g., wheelchair or bedside chair)?: A Lot Help needed  to walk in hospital room?: Total Help needed climbing 3-5 steps with a railing? : Total 6 Click Score: 10    End of Session   Activity Tolerance: Patient tolerated treatment well;Patient limited by fatigue Patient left: in bed;with call bell/phone within reach Nurse Communication: Mobility status PT Visit Diagnosis: Difficulty in walking, not elsewhere classified (R26.2)    Time: 4818-5631 PT Time Calculation (min) (ACUTE ONLY): 22 min   Charges:   PT Evaluation $PT Eval Low Complexity: 1 Low          Mauro Kaufmann PT Acute Rehabilitation Services Pager [phone] Office [phone]   Dion Parrow 11/10/2018, 6:12 PM

## 2018-11-10 NOTE — Anesthesia Preprocedure Evaluation (Addendum)
Anesthesia Evaluation  Patient identified by MRN, date of birth, ID band Patient awake    Reviewed: Allergy & Precautions, NPO status , Patient's Chart, lab work & pertinent test results  Airway Mallampati: I  TM Distance: >3 FB Neck ROM: Full    Dental  (+) Missing, Dental Advisory Given,    Pulmonary asthma , former smoker,    breath sounds clear to auscultation       Cardiovascular hypertension, Pt. on home beta blockers  Rhythm:Regular Rate:Normal     Neuro/Psych    GI/Hepatic negative GI ROS, Neg liver ROS,   Endo/Other  negative endocrine ROS  Renal/GU negative Renal ROS     Musculoskeletal  (+) Arthritis , Osteoarthritis,    Abdominal Normal abdominal exam  (+)   Peds  Hematology  (+) Sickle cell anemia and Sickle cell trait ,   Anesthesia Other Findings   Reproductive/Obstetrics                            Lab Results  Component Value Date   WBC 4.4 11/02/2018   HGB 15.6 11/02/2018   HCT 43.9 11/02/2018   MCV 82.5 11/02/2018   PLT 219 11/02/2018   Lab Results  Component Value Date   CREATININE 0.83 11/02/2018   BUN 14 11/02/2018   NA 136 11/02/2018   K 3.6 11/02/2018   CL 103 11/02/2018   CO2 26 11/02/2018   No results found for: INR, PROTIME  EKG: normal sinus rhythm.  Anesthesia Physical Anesthesia Plan  ASA: II  Anesthesia Plan: Spinal   Post-op Pain Management:    Induction: Intravenous  PONV Risk Score and Plan: 2 and Ondansetron, Dexamethasone, Propofol infusion and Midazolam  Airway Management Planned: Natural Airway and Simple Face Mask  Additional Equipment: None  Intra-op Plan:   Post-operative Plan:   Informed Consent: I have reviewed the patients History and Physical, chart, labs and discussed the procedure including the risks, benefits and alternatives for the proposed anesthesia with the patient or authorized representative who has  indicated his/her understanding and acceptance.       Plan Discussed with: CRNA  Anesthesia Plan Comments:        Anesthesia Quick Evaluation

## 2018-11-10 NOTE — H&P (Signed)
TOTAL HIP ADMISSION H&P  Patient is admitted for right total hip arthroplasty.  Subjective:  Chief Complaint: right hip pain  HPI: [name], 53 y.o. male, has a history of pain and functional disability in the right hip(s) due to end stage avascular necrosis and patient has failed non-surgical conservative treatments for greater than 12 weeks to include NSAID's and/or analgesics, use of assistive devices and activity modification.  Onset of symptoms was abrupt starting 1 years ago with rapidlly worsening course since that time.The patient noted no past surgery on the right hip(s).  Patient currently rates pain in the right hip at 10 out of 10 with activity. Patient has night pain, worsening of pain with activity and weight bearing, pain that interfers with activities of daily living and pain with passive range of motion. Patient has evidence of subchondral cysts and impending femoral head collapse with subchondral fracture by imaging studies. This condition presents safety issues increasing the risk of falls.  There is no current active infection.  Patient Active Problem List   Diagnosis Date Noted  . Avascular necrosis of bone of right hip (HCC) 11/01/2018  . Avascular necrosis of bone of hip, left (HCC) 11/01/2018  . Avascular necrosis of hip, unspecified laterality (HCC) 10/24/2018  . Chronic right hip pain 10/05/2018  . Spinal stenosis at L4-L5 level 08/23/2018  . Essential hypertension, benign 05/24/2016  . DDD (degenerative disc disease), lumbar 02/04/2016  . Routine general medical examination at a health care facility 12/31/2015  . Carpal tunnel syndrome of right wrist 10/02/2011  . Alcohol abuse 01/20/2009  . Perennial and seasonal allergic rhinitis 01/20/2009  . Severe persistent asthma 01/02/2009  . Sarcoidosis (HCC) 11/10/2007   Past Medical History:  Diagnosis Date  . Asthma   . Avascular necrosis (HCC)   . Hypertension   . Sarcoidosis   . Sarcoidosis   . Sickle  cell anemia (HCC)   . Sickle cell trait [facility])     Past Surgical History:  Procedure Laterality Date  . I&D EXTREMITY  01/19/2012   Procedure: IRRIGATION AND DEBRIDEMENT EXTREMITY; right foot, Surgeon: Nadara Mustard, MD;  Location: [facility] OR;  Service: Orthopedics;  Laterality: Right;  . SHOULDER OPEN ROTATOR CUFF REPAIR  2007    Current Facility-Administered Medications  Medication Dose Route Frequency Provider Last Rate Last Dose  . omalizumab Geoffry Paradise) injection 375 mg  375 mg Subcutaneous Q14 Days Bobbitt, Heywood Iles, MD   375 mg at 11/06/18 1434   Current Outpatient Medications  Medication Sig Dispense Refill Last Dose  . albuterol (PROVENTIL) (2.5 MG/3ML) 0.083% nebulizer solution Take 3 mLs (2.5 mg total) by nebulization every 4 (four) hours as needed for wheezing or shortness of breath. (Patient not taking: Reported on 11/07/2018) 75 mL 1 Not Taking  . Azelastine HCl 0.15 % SOLN Place 2 sprays into both nostrils 2 (two) times daily. (Patient taking differently: Place 2 sprays into both nostrils 2 (two) times daily as needed (congestion). ) 30 mL 5 Over a month ago  . budesonide-formoterol (SYMBICORT) 160-4.5 MCG/ACT inhaler Take 2 puffs first thing in am and then another 2 puffs about 12 hours later. (Patient taking differently: Inhale 2 puffs into the lungs 2 (two) times daily. ) 1 Inhaler 5 11/02/2018 at Unknown time  . diclofenac sodium (VOLTAREN) 1 % GEL Apply 4 g topically at bedtime as needed (pain).   1 Taking  . EPINEPHrine 0.3 mg/0.3 mL IJ SOAJ injection Inject 0.3 mg into the muscle as needed for anaphylaxis.  2 Not Taking  . HYDROcodone-acetaminophen (NORCO/VICODIN) 5-325 MG tablet Take 1 tablet by mouth every 6 (six) hours as needed for moderate pain. 40 tablet 0 Taking  . ibuprofen (ADVIL,MOTRIN) 600 MG tablet Take 600 mg by mouth every 8 (eight) hours as needed for moderate pain.   11/01/2018 at Unknown time  . ibuprofen (ADVIL,MOTRIN) 800 MG tablet Take 1 tablet (800 mg total) by  mouth every 8 (eight) hours as needed. (Patient taking differently: Take 800 mg by mouth every 8 (eight) hours as needed for moderate pain. ) 270 tablet 0 Taking  . montelukast (SINGULAIR) 10 MG tablet Take 1 tablet (10 mg total) by mouth at bedtime. (Patient taking differently: Take 10 mg by mouth at bedtime as needed (asthma). ) 30 tablet 5 Taking  . nebivolol (BYSTOLIC) 5 MG tablet Take 1 tablet (5 mg total) by mouth daily. 90 tablet 0 Taking  . omalizumab (XOLAIR) 150 MG injection INJECT 375 MG (3 INJECTIONS SPACING 1" APART) INTO THE SKIN EVERY 14 DAYS. (Patient taking differently: Inject 375 mg into the skin every 14 (fourteen) days. ) 6 each 11 Taking  . traMADol (ULTRAM) 50 MG tablet Take 50 mg by mouth every 6 (six) hours as needed.  0 Taking  . VENTOLIN HFA 108 (90 Base) MCG/ACT inhaler Inhale 2 puffs into the lungs every 6 (six) hours as needed.   Over a month ago  . diazepam (VALIUM) 5 MG tablet Take 1 tablet (5 mg total) by mouth every 12 (twelve) hours as needed for anxiety. (Patient not taking: Reported on 11/02/2018) 2 tablet 0 Not Taking at Unknown time  . levocetirizine (XYZAL) 5 MG tablet Take 1 tablet (5 mg total) by mouth every evening. (Patient not taking: Reported on 11/02/2018) 30 tablet 5 Not Taking  . Olopatadine HCl (PATADAY) 0.2 % SOLN Place 1 drop into both eyes daily. (Patient not taking: Reported on 11/02/2018) 1 Bottle 5 Not Taking  . Tiotropium Bromide Monohydrate (SPIRIVA RESPIMAT) 1.25 MCG/ACT AERS Inhale 2 puffs into the lungs daily. (Patient not taking: Reported on 11/02/2018) 4 g 5 Not Taking at Unknown time   No Known Allergies  Social History   Tobacco Use  . Smoking status: Former Smoker    Packs/day: 0.50    Years: 10.00    Pack years: 5.00    Last attempt to quit: 10/04/1993    Years since quitting: 25.1  . Smokeless tobacco: Never Used  Substance Use Topics  . Alcohol use: Yes    Alcohol/week: 48.0 standard drinks    Types: 3 Standard drinks or  equivalent, 25 Shots of liquor, 20 Cans of beer per week    Family History  Problem Relation Age of Onset  . Asthma Mother   . Coronary artery disease Mother   . Stroke Mother   . Heart attack Mother   . Sickle cell trait Mother   . Prostate cancer Father   . Cancer Father [phone]       prostate  . Alcohol abuse Brother   . Early death Brother   . Drug abuse Brother   . Diabetes Neg Hx   . Heart disease Neg Hx   . Hyperlipidemia Neg Hx   . Hypertension Neg Hx   . Kidney disease Neg Hx      Review of Systems  Musculoskeletal: Positive for back pain and joint pain.  All other systems reviewed and are negative.   Objective:  Physical Exam  Constitutional: He is oriented to person,  place, and time. He appears well-developed and well-nourished.  HENT:  Head: Normocephalic and atraumatic.  Eyes: Pupils are equal, round, and reactive to light. EOM are normal.  Neck: Normal range of motion. Neck supple.  Cardiovascular: Normal rate and regular rhythm.  Respiratory: Effort normal and breath sounds normal.  GI: Soft. Bowel sounds are normal.  Musculoskeletal:     Right hip: He exhibits decreased strength, tenderness and bony tenderness.  Neurological: He is alert and oriented to person, place, and time.  Skin: Skin is warm and dry.  Psychiatric: He has a normal mood and affect.    Vital signs in last 24 hours:    Labs:   Estimated body mass index is 22.06 kg/m as calculated from the following:   Height as of 11/02/18: 5\' 11"  (1.803 m).   Weight as of 11/02/18: 71.8 kg.   Imaging Review Plain radiographs and MRI demonstrate severe avascular necrosis of the right hip(s). The bone quality appears to be excellent for age and reported activity level.      Assessment/Plan:  End stage AVN, right hip(s)  The patient history, physical examination, clinical judgement of the provider and imaging studies are consistent with end stage avascular necrosis of the right hip(s) and  total hip arthroplasty is deemed medically necessary. The treatment options including medical management, and arthroplasty were discussed at length. The risks and benefits of total hip arthroplasty were presented and reviewed. The risks due to aseptic loosening, infection, stiffness, dislocation/subluxation,  thromboembolic complications and other imponderables were discussed.  The patient acknowledged the explanation, agreed to proceed with the plan and consent was signed. Patient is being admitted for inpatient treatment for surgery, pain control, PT, OT, prophylactic antibiotics, VTE prophylaxis, progressive ambulation and ADL's and discharge planning.The patient is planning to be discharged home with home health services    Patient's anticipated LOS is less than 2 midnights, meeting these requirements: - Younger than 3965 - Lives within 1 hour of care - Has a competent adult at home to recover with post-op recover - NO history of  - Chronic pain requiring opiods  - Diabetes  - Coronary Artery Disease  - Heart failure  - Heart attack  - Stroke  - DVT/VTE  - Cardiac arrhythmia  - Respiratory Failure/COPD  - Renal failure  - Anemia  - Advanced Liver disease

## 2018-11-10 NOTE — Transfer of Care (Signed)
Immediate Anesthesia Transfer of Care Note  Patient: [name]  Procedure(s) Performed: RIGHT TOTAL HIP ARTHROPLASTY ANTERIOR APPROACH (Right )  Patient Location: PACU  Anesthesia Type:Spinal  Level of Consciousness: awake, alert , oriented and patient cooperative  Airway & Oxygen Therapy: Patient Spontanous Breathing and Patient connected to face mask oxygen  Post-op Assessment: Report given to RN and Post -op Vital signs reviewed and stable  Post vital signs: Reviewed and stable  Last Vitals:  Vitals Value Taken Time  BP 110/76 11/10/2018  3:11 PM  Temp    Pulse 83 11/10/2018  3:14 PM  Resp 19 11/10/2018  3:14 PM  SpO2 100 % 11/10/2018  3:14 PM  Vitals shown include unvalidated device data.  Last Pain:  Vitals:   11/10/18 1208  TempSrc:   PainSc: 7       Patients Stated Pain Goal: 4 (11/10/18 1208)  Complications: No apparent anesthesia complications

## 2018-11-10 NOTE — Brief Op Note (Signed)
11/10/2018  2:56 PM  PATIENT:  [name]  53 y.o. male  PRE-OPERATIVE DIAGNOSIS:  right hip avascular necrosis with impending femoral head collapse  POST-OPERATIVE DIAGNOSIS:  right hip avascular necrosis with impending femoral head collapse  PROCEDURE:  Procedure(s): RIGHT TOTAL HIP ARTHROPLASTY ANTERIOR APPROACH (Right)  SURGEON:  Surgeon(s) and Role:    Kathryne Hitch, MD - Primary  PHYSICIAN ASSISTANT:  Rexene Edison, PA-C  ANESTHESIA:   spinal  EBL:  150 mL   COUNTS:  YES  TOURNIQUET:  * No tourniquets in log *  DICTATION: .Other Dictation: Dictation Number [id number]  PLAN OF CARE: Admit to inpatient   PATIENT DISPOSITION:  PACU - hemodynamically stable.   Delay start of Pharmacological VTE agent (>24hrs) due to surgical blood loss or risk of bleeding: no

## 2018-11-10 NOTE — Op Note (Signed)
[name], [name] MEDICAL RECORD [id number] ACCOUNT [id number] DATE OF BIRTH:1966-04-28 FACILITY: [facility] LOCATION: [facility] PHYSICIAN:CHRISTOPHER Aretha ParrotY. BLACKMAN, MD  OPERATIVE REPORT  DATE OF PROCEDURE:  11/10/2018  PREOPERATIVE DIAGNOSIS:  End-stage avascular necrosis, right hip femoral head with impending femoral head collapse and fracture.  POSTOPERATIVE DIAGNOSIS:  End-stage avascular necrosis, right hip femoral head with impending femoral head collapse and fracture.  PROCEDURE:  Right total hip arthroplasty, direct anterior approach.  IMPLANTS:  DePuy Sector Gription acetabular component size 52, size 36+0 polyethylene liner, size 12 Corail femoral component with standard offset, size 36+5 ceramic hip ball.  SURGEON:  Vanita PandaChristopher Y. Magnus IvanBlackman, MD  ASSISTANT:  Richardean CanalGilbert Clark, PA-C.  ANESTHESIA:  Spinal.  ANTIBIOTICS:  Two grams IV Ancef.  ESTIMATED BLOOD LOSS:  Less than 300 mL.  COMPLICATIONS:  None.  INDICATIONS:  The patient is a 53 year old gentleman well known to me.  He actually works at [employer].  I have known him for many years now.  He developed acute onset of bilateral hip pain with the right worse than left.  He has had some  lumbar spine issues before at L5-S1 and it was felt that this was potentially related to that.  However, his hip pain got rapidly worse.  He has had steroids over the years.  This was all for back issues.  He has had injections as well.  At this point,  MRIs were obtained of both his hips and surprisingly showed severe avascular necrosis of both femoral heads.  The right side actually has subchondral fracture with impending femoral head collapse.  His pain has rapidly become severe.  He is getting  around with a crutch offloading his hip and at this point, we have recommended a more urgent total hip arthroplasty on the right side.  He will likely need to have this on the left side based on the MRI findings as well as showing  significant AVN of the  left femoral head as well.  At this point, we have recommended total hip arthroplasty.  I have explained in detail the risk of acute blood loss anemia, nerve or vessel injury, fracture, infection, dislocation, DVT and implant failure.  He understands our  goals are to decrease pain, improve mobility and overall improve quality of life.  DESCRIPTION OF PROCEDURE:  After informed consent was obtained and appropriate right hip was marked, he was brought to the operating room and sat up on a stretcher where spinal anesthesia was then obtained.  He was then laid in supine position.  A Foley  catheter was placed.  We assessed his leg lengths and found them to be equal which I expected.  Traction boots were placed on both his feet.  Next, he was placed supine on the Hana fracture table with a perineal post in place and both legs in line  skeletal traction device and no traction applied.  His right operative hip was prepped and draped with DuraPrep and sterile drapes.  A time-out was called to identify correct patient, correct right hip.  We then made an incision just inferior and  posterior to the anterior superior iliac spine and carried this obliquely down the leg.  We dissected down tensor fascia lata muscle.  Tensor fascia was then divided longitudinally to proceed with direct anterior approach to the hip.  We identified and  cauterized circumflex vessels.  I then identified the hip capsule, opened the hip capsule in an L-type format, finding moderate joint effusion.  We placed [employer]ra  retractors around the medial and lateral femoral neck and made our femoral neck cut proximal  to the lesser trochanter with an oscillating saw and completed this with an osteotome.  We placed a corkscrew guide in the femoral head and removed the femoral head in its entirety.  You could see where he was having impending femoral head collapse with  probable cartilage in this area.  We passed that off to the  back table.  We then placed a bent Hohmann over the medial acetabular rim and removed remnants of the acetabular labrum and other debris.  We then began reaming under direct visualization from a  size 44 reamer in stepwise increments up to a size 51 with all reamers under direct visualization, the last one under direct fluoroscopy, so I could obtain my depth of reaming my inclination and anteversion.  I then placed the real DePuy Sector Gription  acetabular component size 52 and a 36+0 neutral polyethylene liner for that size acetabular component.  Attention was then turned to the femur.  With the leg externally rotated to 120 degrees, extended and adducted, we were able to place a Mueller  retractor medially and a Hohmann retractor behind the greater trochanter.  I released lateral joint capsule and used a box-cutting osteotome to enter the femoral canal and a rongeur to lateralize then began broaching using Corail broaching system from a  size 8 going up to a size 12.  With a size 12 in place, we trialed a standard offset femoral neck and with a lower neck cut we went with a 36+5 hip ball, reduced this in the acetabulum.  We were pleased with his offset leg length, range of motion and  stability.  We then dislocated the hip and removed the trial components.  I placed the real Corail femoral component size 12 and the real 36+5 ceramic hip ball and again reduced this in the acetabulum and we were pleased with stability.  We then  irrigated the soft tissue with normal saline solution using pulsatile lavage.  We closed the joint capsule with interrupted #1 Ethibond suture, followed by running 0 Vicryl and tensor fascia, 0 Vicryl in the deep tissue, 2-0 Vicryl subcutaneous tissue  and 4-0 Monocryl subcuticular stitch.  Steri-Strips were applied as well as a well-padded Aquacel dressing.  He was then taken off the Hana table and taken to recovery room in stable condition.  All final counts were correct.  There  were no complications  noted.  Of note, Rexene Edison, PA-C, assisted in the entire case.  His assistance was crucial for facilitating all aspects of this case.  TN/NUANCE  D:11/10/2018 T:11/10/2018 JOB:[id number]

## 2018-11-11 LAB — BASIC METABOLIC PANEL
Anion gap: 11 (ref 5–15)
BUN: 15 mg/dL (ref 6–20)
CO2: 22 mmol/L (ref 22–32)
Calcium: 8.5 mg/dL — ABNORMAL LOW (ref 8.9–10.3)
Chloride: 98 mmol/L (ref 98–111)
Creatinine, Ser: 0.79 mg/dL (ref 0.61–1.24)
GFR calc Af Amer: >60 mL/min (ref >60)
GFR calc non Af Amer: >60 mL/min (ref >60)
GLUCOSE: 167 mg/dL — AB (ref 70–99)
Potassium: 3.9 mmol/L (ref 3.5–5.1)
Sodium: 131 mmol/L — ABNORMAL LOW (ref 135–145)

## 2018-11-11 LAB — CBC
HCT: 40.4 % (ref 39.0–52.0)
Hemoglobin: 14.3 g/dL (ref 13.0–17.0)
MCH: 29.4 pg (ref 26.0–34.0)
MCHC: 35.4 g/dL (ref 30.0–36.0)
MCV: 83 fL (ref 80.0–100.0)
Platelets: 215 10*3/uL (ref 150–400)
RBC: 4.87 MIL/uL (ref 4.22–5.81)
RDW: 13.2 % (ref 11.5–15.5)
WBC: 16.8 10*3/uL — ABNORMAL HIGH (ref 4.0–10.5)
nRBC: 0 % (ref 0.0–0.2)

## 2018-11-11 NOTE — Progress Notes (Signed)
Physical Therapy Treatment Patient Details Name: [name] MRN: [id number] DOB: May 18, 1966 Today's Date: 11/11/2018    History of Present Illness Pt s/p R THR 2* AVN    PT Comments    Pt performed therex program with assist.  Pt assisted to EOB chair to attempt urination but with no luck - RN aware.    Follow Up Recommendations  Home health PT;Follow surgeon's recommendation for DC plan and follow-up therapies     Equipment Recommendations  None recommended by PT    Recommendations for Other Services OT consult     Precautions / Restrictions Precautions Precautions: Fall Restrictions Weight Bearing Restrictions: No Other Position/Activity Restrictions: WBAT    Mobility  Bed Mobility Overal bed mobility: Needs Assistance Bed Mobility: Supine to Sit     Supine to sit: Min assist     General bed mobility comments: cues for sequence and use of R LE to self assist  Transfers Overall transfer level: Needs assistance Equipment used: Rolling walker (2 wheeled) Transfers: Sit to/from Stand Sit to Stand: Min assist;From elevated surface         General transfer comment: cues for LE management and use of UEs to self assist  Ambulation/Gait Ambulation/Gait assistance: Min assist Gait Distance (Feet): 40 Feet Assistive device: Rolling walker (2 wheeled) Gait Pattern/deviations: Step-to pattern;Decreased step length - right;Decreased step length - left;Shuffle;Trunk flexed Gait velocity: decr   General Gait Details: cues for sequence, posture and position from [employer]             Wheelchair Mobility    Modified Rankin (Stroke Patients Only)       Balance Overall balance assessment: Mild deficits observed, not formally tested                                          Cognition Arousal/Alertness: Awake/alert Behavior During Therapy: WFL for tasks assessed/performed Overall Cognitive Status: Within Functional Limits for tasks  assessed                                        Exercises Total Joint Exercises Ankle Circles/Pumps: AROM;15 reps;Supine Quad Sets: AROM;Both;10 reps;Supine Heel Slides: AAROM;Right;20 reps;Supine Hip ABduction/ADduction: AAROM;Right;15 reps;Supine    General Comments        Pertinent Vitals/Pain Pain Assessment: 0-10 Pain Score: 5  Pain Location: R hip Pain Descriptors / Indicators: Aching;Sore Pain Intervention(s): Limited activity within patient's tolerance;Monitored during session;Premedicated before session;Ice applied    Home Living                      Prior Function            PT Goals (current goals can now be found in the care plan section) Acute Rehab PT Goals Patient Stated Goal: Regain IND PT Goal Formulation: With patient Time For Goal Achievement: 11/17/18 Potential to Achieve Goals: Good Progress towards PT goals: Progressing toward goals    Frequency    7X/week      PT Plan Current plan remains appropriate    Co-evaluation              AM-PAC PT "6 Clicks" Mobility   Outcome Measure  Help needed turning from your back to your side while in a flat bed without using  bedrails?: A Little Help needed moving from lying on your back to sitting on the side of a flat bed without using bedrails?: A Little Help needed moving to and from a bed to a chair (including a wheelchair)?: A Little Help needed standing up from a chair using your arms (e.g., wheelchair or bedside chair)?: A Little Help needed to walk in hospital room?: A Little Help needed climbing 3-5 steps with a railing? : A Lot 6 Click Score: 17    End of Session Equipment Utilized During Treatment: Gait belt Activity Tolerance: Patient tolerated treatment well Patient left: in chair;with call bell/phone within reach Nurse Communication: Mobility status PT Visit Diagnosis: Difficulty in walking, not elsewhere classified (R26.2)     Time: 9326-7124 PT  Time Calculation (min) (ACUTE ONLY): 20 min  Charges:  $Gait Training: 8-22 mins $Therapeutic Exercise: 8-22 mins                     [name] PT Acute Rehabilitation Services Pager [phone] Office [phone]    [name] 11/11/2018, 10:32 AM

## 2018-11-11 NOTE — Evaluation (Signed)
Occupational Therapy Evaluation Patient Details Name: [name] MRN: [id number] DOB: 08/22/1966 Today's Date: 11/11/2018    History of Present Illness Pt s/p R THR 2* AVN   Clinical Impression   This 53 year old man was admitted for the above sx. He will have intermittent assistance at home and has a Sports administratorreacher for adls.  Pt will have spouse assist with socks/teds.  Will follow in acute setting with supervision level goals.    Follow Up Recommendations  No OT follow up    Equipment Recommendations  3 in 1 bedside commode    Recommendations for Other Services       Precautions / Restrictions Precautions Precautions: Fall Restrictions Weight Bearing Restrictions: No Other Position/Activity Restrictions: WBAT      Mobility Bed Mobility Overal bed mobility: Needs Assistance Bed Mobility: Supine to Sit     Supine to sit: Min assist     General bed mobility comments: oob  Transfers Overall transfer level: Needs assistance Equipment used: Rolling walker (2 wheeled) Transfers: Sit to/from Stand Sit to Stand: Min assist         General transfer comment: cues for UE placement; assist to extend RLE when sitting    Balance Overall balance assessment: Mild deficits observed, not formally tested                                         ADL either performed or assessed with clinical judgement   ADL Overall ADL's : Needs assistance/impaired     Grooming: Wash/dry hands;Supervision/safety;Standing       Lower Body Bathing: Moderate assistance;Sit to/from stand       Lower Body Dressing: Maximal assistance;Sit to/from stand   Toilet Transfer: Minimal assistance;Ambulation;BSC;RW Toilet Transfer Details (indicate cue type and reason): Assist to extend RLE for comfort Toileting- Clothing Manipulation and Hygiene: Min guard         General ADL Comments: ambulated to bathroom; stood at toilet to urinate.  Educated on use of reacher (which he  has) for adls. Pt can perform UB adls with set up     Vision         Perception     Praxis      Pertinent Vitals/Pain Pain Assessment: 0-10 Pain Score: 7  Pain Location: R hip Pain Descriptors / Indicators: Aching;Sore Pain Intervention(s): Limited activity within patient's tolerance;Monitored during session;Premedicated before session;Repositioned;Ice applied     Hand Dominance     Extremity/Trunk Assessment Upper Extremity Assessment Upper Extremity Assessment: Overall WFL for tasks assessed           Communication Communication Communication: No difficulties   Cognition Arousal/Alertness: Awake/alert Behavior During Therapy: WFL for tasks assessed/performed Overall Cognitive Status: Within Functional Limits for tasks assessed                                     General Comments       Exercises    Shoulder Instructions      Home Living Family/patient expects to be discharged to:: Private residence Living Arrangements: Spouse/significant other Available Help at Discharge: Family               Bathroom Shower/Tub: Chief Strategy OfficerTub/shower unit   Bathroom Toilet: Standard                Prior Functioning/Environment  Level of Independence: Independent                 OT Problem List:        OT Treatment/Interventions: Self-care/ADL training;DME and/or AE instruction;Patient/family education    OT Goals(Current goals can be found in the care plan section) Acute Rehab OT Goals Patient Stated Goal: regain independence and return to work OT Goal Formulation: With patient Time For Goal Achievement: 11/25/18 Potential to Achieve Goals: Good ADL Goals Pt Will Perform Lower Body Dressing: with supervision;with adaptive equipment;sit to/from stand(pants and underwear with reacher) Pt Will Transfer to Toilet: bedside commode;with supervision;ambulating  OT Frequency: Min 2X/week   Barriers to D/C:            Co-evaluation               AM-PAC OT "6 Clicks" Daily Activity     Outcome Measure Help from another person eating meals?: None Help from another person taking care of personal grooming?: A Little Help from another person toileting, which includes using toliet, bedpan, or urinal?: A Little Help from another person bathing (including washing, rinsing, drying)?: A Lot Help from another person to put on and taking off regular upper body clothing?: A Little Help from another person to put on and taking off regular lower body clothing?: A Lot 6 Click Score: 17   End of Session    Activity Tolerance: Patient tolerated treatment well Patient left: in chair;with call bell/phone within reach  OT Visit Diagnosis: Pain Pain - Right/Left: Right Pain - part of body: Hip                Time: [id number] OT Time Calculation (min): 21 min Charges:  OT General Charges $OT Visit: 1 Visit OT Evaluation $OT Eval Low Complexity: 1 Low  Marica OtterMaryellen Lavina Resor, OTR/L Acute Rehabilitation Services [phone] [facility] pager [phone] office 11/11/2018  Sharyon Peitz 11/11/2018, 11:20 AM

## 2018-11-11 NOTE — Plan of Care (Signed)
  Problem: Clinical Measurements: Goal: Ability to maintain clinical measurements within normal limits will improve Outcome: Progressing Goal: Will remain free from infection Outcome: Progressing Goal: Diagnostic test results will improve Outcome: Progressing   Problem: Activity: Goal: Risk for activity intolerance will decrease Outcome: Progressing   

## 2018-11-11 NOTE — Progress Notes (Signed)
Physical Therapy Treatment Patient Details Name: [name] MRN: [id number] DOB: 1966-01-08 Today's Date: 11/11/2018    History of Present Illness Pt s/p R THR 2* AVN    PT Comments    Pt continues motivated and progressing well with mobility.     Follow Up Recommendations  Home health PT;Follow surgeon's recommendation for DC plan and follow-up therapies     Equipment Recommendations  None recommended by PT    Recommendations for Other Services OT consult     Precautions / Restrictions Precautions Precautions: Fall Restrictions Weight Bearing Restrictions: No Other Position/Activity Restrictions: WBAT    Mobility  Bed Mobility Overal bed mobility: Needs Assistance Bed Mobility: Sit to Supine       Sit to supine: Min assist   General bed mobility comments: cues for sequence; min assist to manage R LE  Transfers Overall transfer level: Needs assistance Equipment used: Rolling walker (2 wheeled) Transfers: Sit to/from Stand Sit to Stand: Min guard         General transfer comment: cues for UE placement; assist to extend RLE when sitting  Ambulation/Gait Ambulation/Gait assistance: Min guard Gait Distance (Feet): 140 Feet Assistive device: Rolling walker (2 wheeled) Gait Pattern/deviations: Step-to pattern;Decreased step length - right;Decreased step length - left;Shuffle;Trunk flexed Gait velocity: decr   General Gait Details: cues for sequence, posture and position from [employer]             Wheelchair Mobility    Modified Rankin (Stroke Patients Only)       Balance Overall balance assessment: Mild deficits observed, not formally tested                                          Cognition Arousal/Alertness: Awake/alert Behavior During Therapy: WFL for tasks assessed/performed Overall Cognitive Status: Within Functional Limits for tasks assessed                                        Exercises  Total Joint Exercises Ankle Circles/Pumps: AROM;15 reps;Supine    General Comments        Pertinent Vitals/Pain Pain Assessment: 0-10 Pain Score: 7  Pain Location: R hip Pain Descriptors / Indicators: Aching;Sore Pain Intervention(s): Limited activity within patient's tolerance;Monitored during session;Premedicated before session;Ice applied    Home Living                      Prior Function            PT Goals (current goals can now be found in the care plan section) Acute Rehab PT Goals Patient Stated Goal: regain independence and return to work PT Goal Formulation: With patient Time For Goal Achievement: 11/17/18 Potential to Achieve Goals: Good Progress towards PT goals: Progressing toward goals    Frequency    7X/week      PT Plan Current plan remains appropriate    Co-evaluation              AM-PAC PT "6 Clicks" Mobility   Outcome Measure  Help needed turning from your back to your side while in a flat bed without using bedrails?: A Little Help needed moving from lying on your back to sitting on the side of a flat bed without using bedrails?: A Little Help  needed moving to and from a bed to a chair (including a wheelchair)?: A Little Help needed standing up from a chair using your arms (e.g., wheelchair or bedside chair)?: A Little Help needed to walk in hospital room?: A Little Help needed climbing 3-5 steps with a railing? : A Little 6 Click Score: 18    End of Session Equipment Utilized During Treatment: Gait belt Activity Tolerance: Patient tolerated treatment well Patient left: with call bell/phone within reach;in bed Nurse Communication: Mobility status PT Visit Diagnosis: Difficulty in walking, not elsewhere classified (R26.2)     Time: [id number] PT Time Calculation (min) (ACUTE ONLY): 26 min  Charges:  $Gait Training: 23-37 mins                     [name] PT Acute Rehabilitation Services Pager  [phone] Office [phone]    [name] 11/11/2018, 3:04 PM

## 2018-11-11 NOTE — Care Management Note (Signed)
Case Management Note  Patient Details  Name: [name] MRN: [id number] Date of Birth: 01/25/1966  Subjective/Objective:    S/p R  THR                Action/Plan: NCM spoke to pt and requested RW and 3n1 bedside commode for home. Contacted [facility] for DME to be delivered to room. Offered choice for HH/Medicare list given and placed on chart. [facility] by surgeon's office)  Expected Discharge Date:               Expected Discharge Plan:  Home w Home Health Services  In-House Referral:  NA  Discharge planning Services  CM Consult  Post Acute Care Choice:  Home Health Choice offered to:  Patient  DME Arranged:  3-N-1, Walker rolling DME Agency:  [facility]  HH Arranged:  PT HH [facility]  Status of Service:  Completed, signed off  If discussed at [employer] Meetings, dates discussed:    Additional Comments:  Elliot CousinShavis, Sausha Raymond Ellen, RN 11/11/2018, 2:06 PM

## 2018-11-11 NOTE — Progress Notes (Signed)
Physical Therapy Treatment Patient Details Name: [name] MRN: [id number] DOB: December 26, 1965 Today's Date: 11/11/2018    History of Present Illness Pt s/p R THR 2* AVN    PT Comments    Pt apprehensive but with marked improvement in activity tolerance this am.  Therex deferred to later on arrival of b'fast.   Follow Up Recommendations  Home health PT;Follow surgeon's recommendation for DC plan and follow-up therapies     Equipment Recommendations  None recommended by PT    Recommendations for Other Services OT consult     Precautions / Restrictions Precautions Precautions: Fall Restrictions Weight Bearing Restrictions: No Other Position/Activity Restrictions: WBAT    Mobility  Bed Mobility Overal bed mobility: Needs Assistance Bed Mobility: Supine to Sit     Supine to sit: Min assist     General bed mobility comments: cues for sequence and use of R LE to self assist  Transfers Overall transfer level: Needs assistance Equipment used: Rolling walker (2 wheeled) Transfers: Sit to/from Stand Sit to Stand: Min assist;From elevated surface         General transfer comment: cues for LE management and use of UEs to self assist  Ambulation/Gait Ambulation/Gait assistance: Min assist Gait Distance (Feet): 40 Feet Assistive device: Rolling walker (2 wheeled) Gait Pattern/deviations: Step-to pattern;Decreased step length - right;Decreased step length - left;Shuffle;Trunk flexed Gait velocity: decr   General Gait Details: cues for sequence, posture and position from [employer]             Wheelchair Mobility    Modified Rankin (Stroke Patients Only)       Balance Overall balance assessment: Mild deficits observed, not formally tested                                          Cognition Arousal/Alertness: Awake/alert Behavior During Therapy: WFL for tasks assessed/performed Overall Cognitive Status: Within Functional Limits for  tasks assessed                                        Exercises      General Comments        Pertinent Vitals/Pain Pain Assessment: 0-10 Pain Score: 6  Pain Location: R hip Pain Descriptors / Indicators: Aching;Sore Pain Intervention(s): Limited activity within patient's tolerance;Monitored during session;Premedicated before session;Ice applied    Home Living                      Prior Function            PT Goals (current goals can now be found in the care plan section) Acute Rehab PT Goals Patient Stated Goal: Regain IND PT Goal Formulation: With patient Time For Goal Achievement: 11/17/18 Potential to Achieve Goals: Good Progress towards PT goals: Progressing toward goals    Frequency    7X/week      PT Plan Current plan remains appropriate    Co-evaluation              AM-PAC PT "6 Clicks" Mobility   Outcome Measure  Help needed turning from your back to your side while in a flat bed without using bedrails?: A Little Help needed moving from lying on your back to sitting on the side of a flat bed  without using bedrails?: A Little Help needed moving to and from a bed to a chair (including a wheelchair)?: A Little Help needed standing up from a chair using your arms (e.g., wheelchair or bedside chair)?: A Little Help needed to walk in hospital room?: A Little Help needed climbing 3-5 steps with a railing? : A Lot 6 Click Score: 17    End of Session Equipment Utilized During Treatment: Gait belt Activity Tolerance: Patient tolerated treatment well Patient left: in chair;with call bell/phone within reach Nurse Communication: Mobility status PT Visit Diagnosis: Difficulty in walking, not elsewhere classified (R26.2)     Time: 8841-6606 PT Time Calculation (min) (ACUTE ONLY): 22 min  Charges:  $Gait Training: 8-22 mins                     Mauro Kaufmann PT Acute Rehabilitation Services Pager [phone] Office  [phone]    Letty Salvi 11/11/2018, 8:26 AM

## 2018-11-11 NOTE — Progress Notes (Signed)
Subjective: 1 Day Post-Op Procedure(s) (LRB): RIGHT TOTAL HIP ARTHROPLASTY ANTERIOR APPROACH (Right) Patient reports pain as moderate.    Objective: Vital signs in last 24 hours: Temp:  [96.6 F (35.9 C)-98 F (36.7 C)] 97.6 F (36.4 C) (02/08 0049) Pulse Rate:  [64-90] 71 (02/08 0507) Resp:  [10-17] 16 (02/08 0507) BP: (110-147)/(76-104) 123/78 (02/08 0507) SpO2:  [93 %-100 %] 95 % (02/08 0629) Weight:  [71.8 kg] 71.8 kg (02/07 1208)  Intake/Output from previous day: 02/07 0701 - 02/08 0700 In: 3841.6 [P.O.:1160; I.V.:2231.6; IV Piggyback:450] Out: 1225 [Urine:1075; Blood:150] Intake/Output this shift: No intake/output data recorded.  Recent Labs    11/11/18 0351  HGB 14.3   Recent Labs    11/11/18 0351  WBC 16.8*  RBC 4.87  HCT 40.4  PLT 215   Recent Labs    11/11/18 0351  NA 131*  K 3.9  CL 98  CO2 22  BUN 15  CREATININE 0.79  GLUCOSE 167*  CALCIUM 8.5*   No results for input(s): LABPT, INR in the last 72 hours.  Sensation intact distally Intact pulses distally Dorsiflexion/Plantar flexion intact Incision: dressing C/D/I Compartment soft   Assessment/Plan: 1 Day Post-Op Procedure(s) (LRB): RIGHT TOTAL HIP ARTHROPLASTY ANTERIOR APPROACH (Right) Up with therapy Plan for discharge tomorrow      Richardean Canal 11/11/2018, 7:59 AM

## 2018-11-12 NOTE — Discharge Instructions (Signed)

## 2018-11-12 NOTE — Progress Notes (Signed)
Physical Therapy Treatment Patient Details Name: [name] MRN: [id number] DOB: 1966-01-27 Today's Date: 11/12/2018    History of Present Illness Pt s/p R THR 2* AVN    PT Comments    Pt doing well, today, reviewed stairs, gait, HEP; ready for d/c home    Follow Up Recommendations  Home health PT;Follow surgeon's recommendation for DC plan and follow-up therapies     Equipment Recommendations  None recommended by PT    Recommendations for Other Services       Precautions / Restrictions Precautions Precautions: Fall Restrictions Weight Bearing Restrictions: No Other Position/Activity Restrictions: WBAT    Mobility  Bed Mobility               General bed mobility comments: pt in chair upon arrival   Transfers Overall transfer level: Needs assistance Equipment used: Rolling walker (2 wheeled) Transfers: Sit to/from Stand Sit to Stand: Supervision         General transfer comment: cues for hand placement and LE management.  Ambulation/Gait Ambulation/Gait assistance: Supervision Gait Distance (Feet): 120 Feet Assistive device: Rolling walker (2 wheeled) Gait Pattern/deviations: Step-to pattern;Decreased weight shift to right Gait velocity: decr   General Gait Details: cues for sequence, posture and position from RW   Stairs Stairs: Yes Stairs assistance: Min guard;Min assist Stair Management: Step to pattern;Forwards;With crutches Number of Stairs: 5 General stair comments: cues for sequence and safe technique, min to min/guard for safety   Wheelchair Mobility    Modified Rankin (Stroke Patients Only)       Balance                                            Cognition Arousal/Alertness: Awake/alert Behavior During Therapy: WFL for tasks assessed/performed Overall Cognitive Status: Within Functional Limits for tasks assessed                                        Exercises Total Joint  Exercises Ankle Circles/Pumps: AROM;15 reps;Supine Heel Slides: AAROM;Right;10 reps Hip ABduction/ADduction: AAROM;Right;10 reps    General Comments        Pertinent Vitals/Pain Pain Assessment: 0-10 Pain Score: 4  Pain Location: R hip Pain Descriptors / Indicators: Aching Pain Intervention(s): Limited activity within patient's tolerance;Monitored during session;Premedicated before session;Repositioned;Ice applied    Home Living                      Prior Function            PT Goals (current goals can now be found in the care plan section) Acute Rehab PT Goals Patient Stated Goal: regain independence and return to work PT Goal Formulation: With patient Time For Goal Achievement: 11/17/18 Potential to Achieve Goals: Good Progress towards PT goals: Progressing toward goals    Frequency    7X/week      PT Plan Current plan remains appropriate    Co-evaluation              AM-PAC PT "6 Clicks" Mobility   Outcome Measure  Help needed turning from your back to your side while in a flat bed without using bedrails?: A Little Help needed moving from lying on your back to sitting on the side of a flat bed without using bedrails?:  A Little Help needed moving to and from a bed to a chair (including a wheelchair)?: A Little Help needed standing up from a chair using your arms (e.g., wheelchair or bedside chair)?: A Little Help needed to walk in hospital room?: A Little Help needed climbing 3-5 steps with a railing? : A Little 6 Click Score: 18    End of Session   Activity Tolerance: Patient tolerated treatment well Patient left: with call bell/phone within reach;in chair Nurse Communication: Mobility status PT Visit Diagnosis: Difficulty in walking, not elsewhere classified (R26.2)     Time: 0034-9179 PT Time Calculation (min) (ACUTE ONLY): 29 min  Charges:  $Gait Training: 23-37 mins                     Drucilla Chalet, PT  Pager:  [phone] Acute Rehab Dept [facility]): [phone]   11/12/2018    [facility] 11/12/2018, 11:37 AM

## 2018-11-12 NOTE — Discharge Summary (Signed)
Patient ID: Glade Strausser MRN: [id number] DOB/AGE: June 14, 1966 53 y.o.  Admit date: 11/10/2018 Discharge date: 11/12/2018  Admission Diagnoses:  Principal Problem:   Avascular necrosis of bone of right hip [facility]) Active Problems:   Status post total replacement of right hip   Discharge Diagnoses:  Same  Past Medical History:  Diagnosis Date  . Asthma   . Avascular necrosis (HCC)   . Hypertension   . Sarcoidosis   . Sarcoidosis   . Sickle cell anemia (HCC)   . Sickle cell trait (HCC)     Surgeries: Procedure(s): RIGHT TOTAL HIP ARTHROPLASTY ANTERIOR APPROACH on 11/10/2018   Consultants:   Discharged Condition: Improved  Hospital Course: Alfie Rideaux is an 53 y.o. male who was admitted 11/10/2018 for operative treatment ofAvascular necrosis of bone of right hip (HCC). Patient has severe unremitting pain that affects sleep, daily activities, and work/hobbies. After pre-op clearance the patient was taken to the operating room on 11/10/2018 and underwent  Procedure(s): RIGHT TOTAL HIP ARTHROPLASTY ANTERIOR APPROACH.    Patient was given perioperative antibiotics:  Anti-infectives (From admission, onward)   Start     Dose/Rate Route Frequency Ordered Stop   11/10/18 2000  ceFAZolin (ANCEF) IVPB 1 g/50 mL premix     1 g 100 mL/hr over 30 Minutes Intravenous Every 6 hours 11/10/18 1655 11/11/18 0352   11/10/18 1200  ceFAZolin (ANCEF) IVPB 2g/100 mL premix     2 g 200 mL/hr over 30 Minutes Intravenous On call to O.R. 11/10/18 1156 11/10/18 1415       Patient was given sequential compression devices, early ambulation, and chemoprophylaxis to prevent DVT.  Patient benefited maximally from hospital stay and there were no complications.    Recent vital signs:  Patient Vitals for the past 24 hrs:  BP Temp Temp src Pulse Resp SpO2  11/12/18 0514 118/77 98.3 F (36.8 C) Oral 92 20 98 %  11/11/18 2119 (!) 121/91 98.4 F (36.9 C) Oral 79 20 99 %  11/11/18 1952 - - - - - 98 %   11/11/18 1433 (!) 143/94 98.4 F (36.9 C) Oral 72 18 100 %  11/11/18 0919 133/81 97.7 F (36.5 C) - 71 16 99 %     Recent laboratory studies:  Recent Labs    11/11/18 0351  WBC 16.8*  HGB 14.3  HCT 40.4  PLT 215  NA 131*  K 3.9  CL 98  CO2 22  BUN 15  CREATININE 0.79  GLUCOSE 167*  CALCIUM 8.5*     Discharge Medications:   Allergies as of 11/12/2018   No Known Allergies     Medication List    TAKE these medications   albuterol (2.5 MG/3ML) 0.083% nebulizer solution Commonly known as:  PROVENTIL Take 3 mLs (2.5 mg total) by nebulization every 4 (four) hours as needed for wheezing or shortness of breath.   VENTOLIN HFA 108 (90 Base) MCG/ACT inhaler Generic drug:  albuterol Inhale 2 puffs into the lungs every 6 (six) hours as needed.   aspirin 81 MG chewable tablet Commonly known as:  ASPIRIN CHILDRENS Chew 1 tablet (81 mg total) by mouth 2 (two) times daily after a meal.   Azelastine HCl 0.15 % Soln Place 2 sprays into both nostrils 2 (two) times daily. What changed:    when to take this  reasons to take this   budesonide-formoterol 160-4.5 MCG/ACT inhaler Commonly known as:  SYMBICORT Take 2 puffs first thing in am and then another 2 puffs about  12 hours later. What changed:    how much to take  how to take this  when to take this  additional instructions   diclofenac sodium 1 % Gel Commonly known as:  VOLTAREN Apply 4 g topically at bedtime as needed (pain).   EPINEPHrine 0.3 mg/0.3 mL Soaj injection Commonly known as:  EPI-PEN Inject 0.3 mg into the muscle as needed for anaphylaxis.   HYDROcodone-acetaminophen 5-325 MG tablet Commonly known as:  NORCO/VICODIN Take 1 tablet by mouth every 6 (six) hours as needed for moderate pain.   ibuprofen 600 MG tablet Commonly known as:  ADVIL,MOTRIN Take 600 mg by mouth every 8 (eight) hours as needed for moderate pain. What changed:  Another medication with the same name was changed. Make sure  you understand how and when to take each.   ibuprofen 800 MG tablet Commonly known as:  ADVIL,MOTRIN Take 1 tablet (800 mg total) by mouth every 8 (eight) hours as needed. What changed:  reasons to take this   methocarbamol 500 MG tablet Commonly known as:  ROBAXIN Take 1 tablet (500 mg total) by mouth every 6 (six) hours as needed for muscle spasms.   montelukast 10 MG tablet Commonly known as:  SINGULAIR Take 1 tablet (10 mg total) by mouth at bedtime. What changed:    when to take this  reasons to take this   nebivolol 5 MG tablet Commonly known as:  BYSTOLIC Take 1 tablet (5 mg total) by mouth daily.   omalizumab 150 MG injection Commonly known as:  XOLAIR INJECT 375 MG (3 INJECTIONS SPACING 1" APART) INTO THE SKIN EVERY 14 DAYS. What changed:    how much to take  how to take this  when to take this  additional instructions   oxyCODONE 5 MG immediate release tablet Commonly known as:  ROXICODONE Take 1-2 tablets (5-10 mg total) by mouth every 4 (four) hours as needed.   traMADol 50 MG tablet Commonly known as:  ULTRAM Take 50 mg by mouth every 6 (six) hours as needed.            Durable Medical Equipment  (From admission, onward)         Start     Ordered   11/10/18 1512  DME 3 n 1  Once     11/10/18 1511   11/10/18 1512  DME Walker rolling  Once    Question:  Patient needs a walker to treat with the following condition  Answer:  Status post total replacement of right hip   11/10/18 1511          Diagnostic Studies: Dg Pelvis Portable  Result Date: 11/10/2018 CLINICAL DATA:  53 year old male status post right hip replacement today. EXAM: PORTABLE PELVIS 1-2 VIEWS COMPARISON:  Intraoperative images 15 36 hours. FINDINGS: Portable AP view at 1516 hours. Bipolar right hip arthroplasty in place. Hardware appears intact and normally aligned in the AP projection. No unexpected osseous changes. Regional postoperative changes to the soft tissues  including soft tissue gas. IMPRESSION: Right hip arthroplasty with no adverse features. Electronically Signed   By: Odessa FlemingH  Hall M.D.   On: 11/10/2018 16:18   Mr Hip Right Wo Contrast  Result Date: 10/24/2018 CLINICAL DATA:  Chronic right hip pain. EXAM: MR OF THE RIGHT HIP WITHOUT CONTRAST MR OF THE LEFT HIP WITHOUT CONTRAST TECHNIQUE: Multiplanar, multisequence MR imaging was performed. No intravenous contrast was administered. COMPARISON:  Bilateral hip x-rays dated February 01, 2008. FINDINGS: Bones: Serpiginous signal  changes in both femoral heads consistent with avascular necrosis. No subchondral collapse. No acute fracture or dislocation. No focal bone lesion. The visualized sacroiliac joints and symphysis pubis appear normal. Unchanged partial sacralization of L1. Articular cartilage and labrum Articular cartilage: No focal chondral defect or subchondral signal abnormality identified. Labrum: Blunting of the right anterior labrum, consistent with partial tear. The left labrum is grossly intact. Joint or bursal effusion Joint effusion: No significant hip joint effusion. Bursae: No focal periarticular fluid collection. Muscles and tendons Muscles and tendons: Tendinosis and low-grade partial tearing of the left greater than right hamstring tendon origins. The bilateral gluteal and iliopsoas tendons are intact. No muscle edema or atrophy. Other findings Miscellaneous: The visualized internal pelvic contents appear unremarkable. IMPRESSION: 1. Bilateral femoral head avascular necrosis without subchondral collapse. 2. Right anterior labral tear. 3. Tendinosis and low-grade partial tearing of the left greater than right hamstring tendon origins. Electronically Signed   By: Obie Dredge M.D.   On: 10/24/2018 08:30   Mr Hip Left Wo Contrast  Result Date: 10/24/2018 CLINICAL DATA:  Chronic right hip pain. EXAM: MR OF THE RIGHT HIP WITHOUT CONTRAST MR OF THE LEFT HIP WITHOUT CONTRAST TECHNIQUE: Multiplanar,  multisequence MR imaging was performed. No intravenous contrast was administered. COMPARISON:  Bilateral hip x-rays dated February 01, 2008. FINDINGS: Bones: Serpiginous signal changes in both femoral heads consistent with avascular necrosis. No subchondral collapse. No acute fracture or dislocation. No focal bone lesion. The visualized sacroiliac joints and symphysis pubis appear normal. Unchanged partial sacralization of L1. Articular cartilage and labrum Articular cartilage: No focal chondral defect or subchondral signal abnormality identified. Labrum: Blunting of the right anterior labrum, consistent with partial tear. The left labrum is grossly intact. Joint or bursal effusion Joint effusion: No significant hip joint effusion. Bursae: No focal periarticular fluid collection. Muscles and tendons Muscles and tendons: Tendinosis and low-grade partial tearing of the left greater than right hamstring tendon origins. The bilateral gluteal and iliopsoas tendons are intact. No muscle edema or atrophy. Other findings Miscellaneous: The visualized internal pelvic contents appear unremarkable. IMPRESSION: 1. Bilateral femoral head avascular necrosis without subchondral collapse. 2. Right anterior labral tear. 3. Tendinosis and low-grade partial tearing of the left greater than right hamstring tendon origins. Electronically Signed   By: Obie Dredge M.D.   On: 10/24/2018 08:30   Dg C-arm 1-60 Min  Result Date: 11/10/2018 CLINICAL DATA:  Avascular necrosis of the right femoral head. EXAM: DG C-ARM 61-120 MIN; OPERATIVE RIGHT HIP WITH PELVIS COMPARISON:  MRI dated 10/24/2018 FINDINGS: AP C-arm images demonstrate the patient has undergone right total hip replacement. The components appear in good position in the AP projection. No fractures. IMPRESSION: Right total hip prosthesis appears in excellent position in the AP projection. FLUOROSCOPY TIME:  18 seconds.  1.5 mGy. C-arm fluoroscopic images were obtained  intraoperatively and submitted for post operative interpretation. Electronically Signed   By: Francene Boyers M.D.   On: 11/10/2018 14:59   Dg Hip Operative Unilat With Pelvis Right  Result Date: 11/10/2018 CLINICAL DATA:  Avascular necrosis of the right femoral head. EXAM: DG C-ARM 61-120 MIN; OPERATIVE RIGHT HIP WITH PELVIS COMPARISON:  MRI dated 10/24/2018 FINDINGS: AP C-arm images demonstrate the patient has undergone right total hip replacement. The components appear in good position in the AP projection. No fractures. IMPRESSION: Right total hip prosthesis appears in excellent position in the AP projection. FLUOROSCOPY TIME:  18 seconds.  1.5 mGy. C-arm fluoroscopic images were obtained intraoperatively and  submitted for post operative interpretation. Electronically Signed   By: Francene BoyersJames  Maxwell M.D.   On: 11/10/2018 14:59    Disposition: Discharge disposition: 01-Home or Self Care         Follow-up Information    [facility] Follow up.   Specialty:  Home Health Services Why:  Home Health Physical Therapy-agency will call to arrange initial visit Contact information: [address] [place] [address] [phone]        Kathryne HitchBlackman, Collier Monica Y, MD Follow up in 2 week(s).   Specialty:  Orthopedic Surgery Contact information: [address] [place] [address] [phone]            Signed: Kathryne HitchChristopher Y Haunani Dickard 11/12/2018, 9:02 AM

## 2018-11-12 NOTE — Progress Notes (Addendum)
Occupational Therapy Treatment Patient Details Name: [name] MRN: [id number] DOB: Dec 31, 1965 Today's Date: 11/12/2018    History of present illness Pt s/p R THR 2* AVN   OT comments  Pt doing well and performed functional mobility at min guard assist level with RW this am and min cues. Reviewed tub transfer sequence and pt states he will likely sponge bathe initially but verbalizes understanding of proper tub transfer sequence. Educated further on AE and 3in1 use. Pt currently with 5-6/10 pain with activity but is moving well.    Follow Up Recommendations  No OT follow up    Equipment Recommendations  3 in 1 bedside commode    Recommendations for Other Services      Precautions / Restrictions Precautions Precautions: Fall Restrictions Weight Bearing Restrictions: No Other Position/Activity Restrictions: WBAT       Mobility Bed Mobility               General bed mobility comments: pt in chair upon arrival   Transfers   Equipment used: Rolling walker (2 wheeled) Transfers: Sit to/from Stand Sit to Stand: Min guard         General transfer comment: cues for hand placement and LE management.    Balance                                           ADL either performed or assessed with clinical judgement   ADL                           Toilet Transfer: Min guard;Ambulation;BSC;RW   Toileting- Water quality scientist and Hygiene: Min guard;Sit to/from stand         General ADL Comments: Pt performed functional mobility to the bathroom and practiced 3in1 transfer with walker. He needed min cues for proper walker distance initially but did better with increased practice. Pt with min cues for safety of 3in1 armrests and cues to slide R LE forward prior to sitting. Discussed tub transfer and how to practice liftingg R LE simulating tub transfer at home to determine if he feels he can lift R LE over tub before actually performing.  Educated on proper sequence for tub transfer and he states he will likely obtain a tub seat also. Pt states he thinks he has a Secondary school teacher at home and discussed all AE options; pt states he may consider obtaining AE kit. Reviewed use of reacher tro don pants and underwear and proper sequence. pt also stood to wash hands and brush teeth. No balance concerns standing at the sink. Discussed importance of using ice and alternating rest with activity.      Vision Patient Visual Report: No change from baseline     Perception     Praxis      Cognition Arousal/Alertness: Awake/alert Behavior During Therapy: WFL for tasks assessed/performed Overall Cognitive Status: Within Functional Limits for tasks assessed                                          Exercises     Shoulder Instructions       General Comments      Pertinent Vitals/ Pain       Pain Assessment: 0-10 Pain Score: 6  Pain Location: R hip Pain Descriptors / Indicators: Aching Pain Intervention(s): Monitored during session;Ice applied  Home Living                                          Prior Functioning/Environment              Frequency  Min 2X/week        Progress Toward Goals  OT Goals(current goals can now be found in the care plan section)  Progress towards OT goals: Progressing toward goals     Plan Discharge plan remains appropriate    Co-evaluation                 AM-PAC OT "6 Clicks" Daily Activity     Outcome Measure   Help from another person eating meals?: None Help from another person taking care of personal grooming?: A Little Help from another person toileting, which includes using toliet, bedpan, or urinal?: A Little Help from another person bathing (including washing, rinsing, drying)?: A Little Help from another person to put on and taking off regular upper body clothing?: A Little Help from another person to put on and taking off regular  lower body clothing?: A Little 6 Click Score: 19    End of Session Equipment Utilized During Treatment: Rolling walker  OT Visit Diagnosis: Pain Pain - Right/Left: Right Pain - part of body: Hip   Activity Tolerance Patient tolerated treatment well   Patient Left in chair;with call bell/phone within reach   Nurse Communication          Time: 6629-4765 OT Time Calculation (min): 30 min  Charges: OT General Charges $OT Visit: 1 Visit OT Treatments $Self Care/Home Management : 8-22 mins $Therapeutic Activity: 8-22 mins     [facility] [phone] 11/12/2018, 9:52 AM

## 2018-11-12 NOTE — Progress Notes (Signed)
Patient ID: [name], male   DOB: 04/12/1966, 53 y.o.   MRN: [id number] Doing well overall.  Can be discharged to home this afternoon after therapy.

## 2018-11-14 ENCOUNTER — Telehealth: Payer: Self-pay | Admitting: *Deleted

## 2018-11-14 ENCOUNTER — Encounter (HOSPITAL_COMMUNITY): Payer: Self-pay | Admitting: Orthopaedic Surgery

## 2018-11-14 ENCOUNTER — Other Ambulatory Visit: Payer: Self-pay | Admitting: *Deleted

## 2018-11-14 ENCOUNTER — Telehealth (INDEPENDENT_AMBULATORY_CARE_PROVIDER_SITE_OTHER): Payer: Self-pay | Admitting: Orthopaedic Surgery

## 2018-11-14 DIAGNOSIS — M87052 Idiopathic aseptic necrosis of left femur: Secondary | ICD-10-CM | POA: Diagnosis not present

## 2018-11-14 DIAGNOSIS — Z87891 Personal history of nicotine dependence: Secondary | ICD-10-CM | POA: Diagnosis not present

## 2018-11-14 DIAGNOSIS — M48061 Spinal stenosis, lumbar region without neurogenic claudication: Secondary | ICD-10-CM | POA: Diagnosis not present

## 2018-11-14 DIAGNOSIS — Z471 Aftercare following joint replacement surgery: Secondary | ICD-10-CM | POA: Diagnosis not present

## 2018-11-14 DIAGNOSIS — F1010 Alcohol abuse, uncomplicated: Secondary | ICD-10-CM | POA: Diagnosis not present

## 2018-11-14 DIAGNOSIS — M5136 Other intervertebral disc degeneration, lumbar region: Secondary | ICD-10-CM | POA: Diagnosis not present

## 2018-11-14 DIAGNOSIS — D573 Sickle-cell trait: Secondary | ICD-10-CM | POA: Diagnosis not present

## 2018-11-14 DIAGNOSIS — I10 Essential (primary) hypertension: Secondary | ICD-10-CM | POA: Diagnosis not present

## 2018-11-14 DIAGNOSIS — J4550 Severe persistent asthma, uncomplicated: Secondary | ICD-10-CM | POA: Diagnosis not present

## 2018-11-14 NOTE — Patient Outreach (Signed)
[facility]) Care Management  11/14/2018  Middleton Daquino 1966-02-17 [id number]  Transition of care call   Referral received: 11/07/18 Initial outreach: 11/07/18 for pre-op call Insurance: [facility] Choice Plan   Subjective: Initial successful telephone call to patient's preferred number in order to complete transition of care assessment; 2 HIPAA identifiers verified. Explained purpose of call and completed transition of care assessment.  Azhar states his pain I adequately controlled with the prescribed pain medications and he is taking the medication as directed and using his incentive spirometer every hour to keep his lungs clear. He says he had his first home physical therapy session today and he did "OK". He denies bowel or bladder issues. He states the surgical dressing is dry and intact. He says he had his wife purchase a shower chair and he has a Sports administrator. He says he brought his urinal from the hospital and a 3 in 1 and rolling walker were issued to him before he was discharged.     Objective:  Kashden Franceschini was hospitalized at [facility] from 2/7-2/9//2020 for right total hip arthroplasty using an anterior approach. . Comorbidities include: HTN, asthma (since childhood) , sickle cell, and sarcoidosis. He was discharged to home on 11/12/18 with [facility] to provide home physical therapy services and with the following DME: 3 in 1, rolling walker.    Assessment:  See transition of care flowsheet for assessment details.   Plan:  No ongoing care management needs identified so will close case to [facility] Care Management care management services and route successful outreach letter with [facility] Care Management pamphlet and 24 Hour Nurse Line Magnet to [employer] Care Management clinical pool to be mailed to patient's home address.   Bary Richard RN,CCM,CDE [facility] Care Management  Coordinator Office Phone [phone] Office Fax [phone]

## 2018-11-14 NOTE — Telephone Encounter (Signed)
Received call from Kreg Shropshire (PT) with [facility] needing verbal orders for HHPT   2 Wk 1 and 3 Wk 1. The number to contact Jae Dire is [phone]

## 2018-11-14 NOTE — Telephone Encounter (Signed)
Pt was on TCM report admitted 11/10/18 for Avascular necrosis of bone of right hip. After pre-op clearance the pt underwent RIGHT TOTAL HIP ARTHROPLASTY ANTERIOR APPROACH procedure. Pt D/C 11/12/18 w/ home PT. Pt will follow-up w/specialist  Kathryne Hitch, MD Follow up in 2 week.Marland KitchenRaechel Chute

## 2018-11-14 NOTE — Telephone Encounter (Signed)
Verbal order given  

## 2018-11-15 ENCOUNTER — Telehealth (INDEPENDENT_AMBULATORY_CARE_PROVIDER_SITE_OTHER): Payer: Self-pay | Admitting: Orthopaedic Surgery

## 2018-11-15 NOTE — Telephone Encounter (Signed)
11/07/18 OOW Note faxed to [employer] per pts request as they will supplement his payment will he is oow fax# [phone]

## 2018-11-15 NOTE — Telephone Encounter (Signed)
Patient called checking on disability and FMLA forms. I advised him they were scanned in and faxed by [employer] this morning at 11:24. Told him to let me know if need to re fax

## 2018-11-16 ENCOUNTER — Telehealth (INDEPENDENT_AMBULATORY_CARE_PROVIDER_SITE_OTHER): Payer: Self-pay | Admitting: Orthopaedic Surgery

## 2018-11-16 MED ORDER — METHOCARBAMOL 500 MG PO TABS: 500.0000 mg | ORAL_TABLET | Freq: Four times a day (QID) | ORAL | 0 refills | Status: DC | PRN

## 2018-11-16 MED ORDER — OXYCODONE HCL 5 MG PO TABS: 5.0000 mg | ORAL_TABLET | ORAL | 0 refills | Status: DC | PRN

## 2018-11-16 MED FILL — oxyCODONE HCL 5 MG TABS: 5 | 3 days supply | Qty: 40 | Fill #0

## 2018-11-16 NOTE — Telephone Encounter (Signed)
Patient also needs muscle relaxer

## 2018-11-16 NOTE — Telephone Encounter (Signed)
Patient called to get refill of medication Oxycodone.  Call patient to advise.[phone]

## 2018-11-16 NOTE — Telephone Encounter (Signed)
I sent both in  

## 2018-11-16 NOTE — Telephone Encounter (Signed)
Please advise 

## 2018-11-17 DIAGNOSIS — Z87891 Personal history of nicotine dependence: Secondary | ICD-10-CM | POA: Diagnosis not present

## 2018-11-17 DIAGNOSIS — J4550 Severe persistent asthma, uncomplicated: Secondary | ICD-10-CM | POA: Diagnosis not present

## 2018-11-17 DIAGNOSIS — M87052 Idiopathic aseptic necrosis of left femur: Secondary | ICD-10-CM | POA: Diagnosis not present

## 2018-11-17 DIAGNOSIS — F1010 Alcohol abuse, uncomplicated: Secondary | ICD-10-CM | POA: Diagnosis not present

## 2018-11-17 DIAGNOSIS — M5136 Other intervertebral disc degeneration, lumbar region: Secondary | ICD-10-CM | POA: Diagnosis not present

## 2018-11-17 DIAGNOSIS — Z471 Aftercare following joint replacement surgery: Secondary | ICD-10-CM | POA: Diagnosis not present

## 2018-11-17 DIAGNOSIS — M48061 Spinal stenosis, lumbar region without neurogenic claudication: Secondary | ICD-10-CM | POA: Diagnosis not present

## 2018-11-17 DIAGNOSIS — D573 Sickle-cell trait: Secondary | ICD-10-CM | POA: Diagnosis not present

## 2018-11-17 DIAGNOSIS — I10 Essential (primary) hypertension: Secondary | ICD-10-CM | POA: Diagnosis not present

## 2018-11-20 DIAGNOSIS — Z471 Aftercare following joint replacement surgery: Secondary | ICD-10-CM | POA: Diagnosis not present

## 2018-11-20 DIAGNOSIS — Z87891 Personal history of nicotine dependence: Secondary | ICD-10-CM | POA: Diagnosis not present

## 2018-11-20 DIAGNOSIS — D573 Sickle-cell trait: Secondary | ICD-10-CM | POA: Diagnosis not present

## 2018-11-20 DIAGNOSIS — M48061 Spinal stenosis, lumbar region without neurogenic claudication: Secondary | ICD-10-CM | POA: Diagnosis not present

## 2018-11-20 DIAGNOSIS — M87052 Idiopathic aseptic necrosis of left femur: Secondary | ICD-10-CM | POA: Diagnosis not present

## 2018-11-20 DIAGNOSIS — J4550 Severe persistent asthma, uncomplicated: Secondary | ICD-10-CM | POA: Diagnosis not present

## 2018-11-20 DIAGNOSIS — M5136 Other intervertebral disc degeneration, lumbar region: Secondary | ICD-10-CM | POA: Diagnosis not present

## 2018-11-20 DIAGNOSIS — I10 Essential (primary) hypertension: Secondary | ICD-10-CM | POA: Diagnosis not present

## 2018-11-20 DIAGNOSIS — F1010 Alcohol abuse, uncomplicated: Secondary | ICD-10-CM | POA: Diagnosis not present

## 2018-11-22 DIAGNOSIS — M87052 Idiopathic aseptic necrosis of left femur: Secondary | ICD-10-CM | POA: Diagnosis not present

## 2018-11-22 DIAGNOSIS — Z471 Aftercare following joint replacement surgery: Secondary | ICD-10-CM | POA: Diagnosis not present

## 2018-11-22 DIAGNOSIS — M48061 Spinal stenosis, lumbar region without neurogenic claudication: Secondary | ICD-10-CM | POA: Diagnosis not present

## 2018-11-22 DIAGNOSIS — I10 Essential (primary) hypertension: Secondary | ICD-10-CM | POA: Diagnosis not present

## 2018-11-22 DIAGNOSIS — F1010 Alcohol abuse, uncomplicated: Secondary | ICD-10-CM | POA: Diagnosis not present

## 2018-11-22 DIAGNOSIS — J4550 Severe persistent asthma, uncomplicated: Secondary | ICD-10-CM | POA: Diagnosis not present

## 2018-11-22 DIAGNOSIS — Z87891 Personal history of nicotine dependence: Secondary | ICD-10-CM | POA: Diagnosis not present

## 2018-11-22 DIAGNOSIS — M5136 Other intervertebral disc degeneration, lumbar region: Secondary | ICD-10-CM | POA: Diagnosis not present

## 2018-11-22 DIAGNOSIS — D573 Sickle-cell trait: Secondary | ICD-10-CM | POA: Diagnosis not present

## 2018-11-23 ENCOUNTER — Encounter (INDEPENDENT_AMBULATORY_CARE_PROVIDER_SITE_OTHER): Payer: Self-pay | Admitting: Orthopaedic Surgery

## 2018-11-23 ENCOUNTER — Ambulatory Visit (INDEPENDENT_AMBULATORY_CARE_PROVIDER_SITE_OTHER): Payer: 59

## 2018-11-23 ENCOUNTER — Ambulatory Visit (INDEPENDENT_AMBULATORY_CARE_PROVIDER_SITE_OTHER): Payer: 59 | Admitting: Orthopaedic Surgery

## 2018-11-23 DIAGNOSIS — J4550 Severe persistent asthma, uncomplicated: Secondary | ICD-10-CM

## 2018-11-23 DIAGNOSIS — Z96641 Presence of right artificial hip joint: Secondary | ICD-10-CM

## 2018-11-23 MED ORDER — OXYCODONE HCL 5 MG PO TABS: 5.0000 mg | ORAL_TABLET | ORAL | 0 refills | Status: DC | PRN

## 2018-11-23 MED ORDER — METHOCARBAMOL 500 MG PO TABS: 500.0000 mg | ORAL_TABLET | Freq: Four times a day (QID) | ORAL | 0 refills | Status: DC | PRN

## 2018-11-23 MED FILL — METHOCARBAMOL 500 MG TABLET: 500 | 10 days supply | Qty: 40 | Fill #0

## 2018-11-23 MED FILL — oxyCODONE HCL 5 MG TABS: 5 | 3 days supply | Qty: 40 | Fill #0

## 2018-11-23 NOTE — Progress Notes (Signed)
The patient is 2 weeks tomorrow status post a right total hip arthroplasty to treat significant avascular necrosis of his right hip.  He still mobilizing slowly and having said amount of pain.  On exam his calf is soft.  His incision looks good and I removed the Steri-Strips in place new Steri-Strips.  There is no significant seroma.  His calf is soft.  He has 1 more home health visit.  They will still continue to work on balance and coordination.  He is going to stay out of work until further notice.  I will reevaluate him in 4 weeks to see how he is doing overall.  I did refill his oxycodone and Robaxin.  All question concerns were answered and addressed.

## 2018-11-24 DIAGNOSIS — Z471 Aftercare following joint replacement surgery: Secondary | ICD-10-CM | POA: Diagnosis not present

## 2018-11-24 DIAGNOSIS — Z87891 Personal history of nicotine dependence: Secondary | ICD-10-CM | POA: Diagnosis not present

## 2018-11-24 DIAGNOSIS — M87052 Idiopathic aseptic necrosis of left femur: Secondary | ICD-10-CM | POA: Diagnosis not present

## 2018-11-24 DIAGNOSIS — J4550 Severe persistent asthma, uncomplicated: Secondary | ICD-10-CM | POA: Diagnosis not present

## 2018-11-24 DIAGNOSIS — I10 Essential (primary) hypertension: Secondary | ICD-10-CM | POA: Diagnosis not present

## 2018-11-24 DIAGNOSIS — F1010 Alcohol abuse, uncomplicated: Secondary | ICD-10-CM | POA: Diagnosis not present

## 2018-11-24 DIAGNOSIS — D573 Sickle-cell trait: Secondary | ICD-10-CM | POA: Diagnosis not present

## 2018-11-24 DIAGNOSIS — M48061 Spinal stenosis, lumbar region without neurogenic claudication: Secondary | ICD-10-CM | POA: Diagnosis not present

## 2018-11-24 DIAGNOSIS — M5136 Other intervertebral disc degeneration, lumbar region: Secondary | ICD-10-CM | POA: Diagnosis not present

## 2018-11-28 MED FILL — XOLAIR 150 MG SOLR: 150 | 28 days supply | Qty: 6 | Fill #2

## 2018-11-30 ENCOUNTER — Other Ambulatory Visit: Payer: Self-pay | Admitting: Internal Medicine

## 2018-11-30 DIAGNOSIS — I10 Essential (primary) hypertension: Secondary | ICD-10-CM

## 2018-11-30 MED FILL — BYSTOLIC 5 MG TABLET: 5 | 90 days supply | Qty: 90 | Fill #0

## 2018-12-05 ENCOUNTER — Encounter: Payer: Self-pay | Admitting: *Deleted

## 2018-12-05 NOTE — Progress Notes (Signed)
This encounter was created in error - please disregard.

## 2018-12-08 ENCOUNTER — Telehealth (INDEPENDENT_AMBULATORY_CARE_PROVIDER_SITE_OTHER): Payer: Self-pay | Admitting: Orthopaedic Surgery

## 2018-12-08 ENCOUNTER — Ambulatory Visit (INDEPENDENT_AMBULATORY_CARE_PROVIDER_SITE_OTHER): Payer: 59 | Admitting: *Deleted

## 2018-12-08 DIAGNOSIS — J4550 Severe persistent asthma, uncomplicated: Secondary | ICD-10-CM | POA: Diagnosis not present

## 2018-12-08 MED ORDER — OXYCODONE HCL 5 MG PO TABS: 5.0000 mg | ORAL_TABLET | ORAL | 0 refills | Status: DC | PRN

## 2018-12-08 MED ORDER — METHOCARBAMOL 500 MG PO TABS: 500.0000 mg | ORAL_TABLET | Freq: Four times a day (QID) | ORAL | 0 refills | Status: DC | PRN

## 2018-12-08 MED FILL — METHOCARBAMOL 500 MG TABLET: 500 | 10 days supply | Qty: 40 | Fill #0

## 2018-12-08 MED FILL — oxyCODONE HCL 5 MG TABS: 5 | 3 days supply | Qty: 40 | Fill #0

## 2018-12-08 NOTE — Telephone Encounter (Signed)
Please advise 

## 2018-12-08 NOTE — Telephone Encounter (Signed)
Pt came into the office asking if he can have a refill on oxycodone and  methocarbamol

## 2018-12-13 DIAGNOSIS — Z760 Encounter for issue of repeat prescription: Secondary | ICD-10-CM | POA: Diagnosis not present

## 2018-12-21 ENCOUNTER — Ambulatory Visit (INDEPENDENT_AMBULATORY_CARE_PROVIDER_SITE_OTHER): Payer: 59 | Admitting: *Deleted

## 2018-12-21 DIAGNOSIS — J4550 Severe persistent asthma, uncomplicated: Secondary | ICD-10-CM

## 2018-12-22 ENCOUNTER — Ambulatory Visit: Payer: Self-pay

## 2018-12-22 MED FILL — IBUPROFEN 800 MG TAB: 800 | 30 days supply | Qty: 90 | Fill #1

## 2018-12-25 ENCOUNTER — Telehealth (INDEPENDENT_AMBULATORY_CARE_PROVIDER_SITE_OTHER): Payer: Self-pay

## 2018-12-25 NOTE — Telephone Encounter (Signed)
Called patient and asked the screening questions.  Do you have now or have you had in the past 7 days a fever and/or chills? NO  Do you have now or have you had in the past 7 days a cough? NO  Do you have now or have you had in the last 7 days nausea, vomiting or abdominal pain? NO  Have you been exposed to anyone who has tested positive for COVID-19? NO  Have you or anyone who lives with you traveled within the last month? NO 

## 2018-12-26 ENCOUNTER — Other Ambulatory Visit: Payer: Self-pay

## 2018-12-26 ENCOUNTER — Encounter (INDEPENDENT_AMBULATORY_CARE_PROVIDER_SITE_OTHER): Payer: Self-pay | Admitting: Orthopaedic Surgery

## 2018-12-26 ENCOUNTER — Telehealth (INDEPENDENT_AMBULATORY_CARE_PROVIDER_SITE_OTHER): Payer: Self-pay

## 2018-12-26 ENCOUNTER — Ambulatory Visit (INDEPENDENT_AMBULATORY_CARE_PROVIDER_SITE_OTHER): Payer: 59 | Admitting: Orthopaedic Surgery

## 2018-12-26 DIAGNOSIS — Z96641 Presence of right artificial hip joint: Secondary | ICD-10-CM

## 2018-12-26 MED ORDER — METHOCARBAMOL 500 MG PO TABS: 500.0000 mg | ORAL_TABLET | Freq: Four times a day (QID) | ORAL | 1 refills | Status: DC | PRN

## 2018-12-26 MED ORDER — HYDROCODONE-ACETAMINOPHEN 5-325 MG PO TABS: 1.0000 | ORAL_TABLET | Freq: Four times a day (QID) | ORAL | 0 refills | Status: DC | PRN

## 2018-12-26 MED FILL — HYDROCODON-APAP 5-325: 5-325 | 7 days supply | Qty: 30 | Fill #0

## 2018-12-26 MED FILL — METHOCARBAMOL 500 MG TABLET: 500 | 10 days supply | Qty: 40 | Fill #0

## 2018-12-26 MED FILL — XOLAIR 150 MG SOLR: 150 | 28 days supply | Qty: 6 | Fill #3

## 2018-12-26 NOTE — Progress Notes (Signed)
HPI: Mr. Chilcott returns today 6 weeks status post right total hip arthroplasty.  He states he is still tender over the incision area lateral aspect of the hip.  He states he is unable to sleep on the hip at night.  Has been doing exercises taught by therapy working on range of motion strengthening.  Taking hydrocodone Robaxin is asking for refill on both of these.  Denies any numbness tingling down the hip.  Very rarely does he have any burning about the hip.  Physical exam: Surgical incisions healing well no signs of infection slight keloid.  Calf supple nontender.  Dorsiflexion plantarflexion of the right ankle intact.  Ambulates without any assistive device.  Impression: Status post right total hip arthroplasty 6 weeks  Plan: Home asking to refrain from doing abduction exercises of the hip.  Showed him some IT band stretching exercises.  He will work on scar tissue mobilization.  Refill on his hydrocodone Robaxin were sent in for him.  He will follow-up with [place] in 1 month sooner if there is any questions concerns.

## 2018-12-26 NOTE — Telephone Encounter (Signed)
Patient called concerning medication being sent to pharmacy.  Advised patient that Rx's were sent to [facility].

## 2018-12-28 ENCOUNTER — Ambulatory Visit (INDEPENDENT_AMBULATORY_CARE_PROVIDER_SITE_OTHER): Payer: 59 | Admitting: Orthopaedic Surgery

## 2019-01-04 ENCOUNTER — Encounter: Payer: Self-pay | Admitting: *Deleted

## 2019-01-04 ENCOUNTER — Encounter: Payer: Self-pay | Admitting: Allergy and Immunology

## 2019-01-04 ENCOUNTER — Ambulatory Visit (INDEPENDENT_AMBULATORY_CARE_PROVIDER_SITE_OTHER): Payer: 59 | Admitting: *Deleted

## 2019-01-04 ENCOUNTER — Telehealth: Payer: Self-pay | Admitting: *Deleted

## 2019-01-04 DIAGNOSIS — J4550 Severe persistent asthma, uncomplicated: Secondary | ICD-10-CM

## 2019-01-04 NOTE — Telephone Encounter (Signed)
Letter typed and signed

## 2019-01-04 NOTE — Telephone Encounter (Signed)
To whom it may concern:  [name] is a patient of mine at the [facility].  [name] has severe persistent asthma requiring treatment with multiple asthma controller medications.  Having underlying pulmonary disease puts [name] at higher risk for complications of COVID-19.  As such, it is my opinion that he should not be working in the hospital setting at this time.  Please do not hesitate to call with questions.  Sincerely,    R.  Jorene Guest, MD

## 2019-01-04 NOTE — Telephone Encounter (Signed)
Pt will pick up next week

## 2019-01-04 NOTE — Telephone Encounter (Signed)
Patient came in and stated that he is suppose to be released from his orthopedic physician on April 21st. He is wondering if he is released could we provide a letter stating that he should not go back into the hospital at this time due to his asthma and the COVID? 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

## 2019-01-12 ENCOUNTER — Other Ambulatory Visit (INDEPENDENT_AMBULATORY_CARE_PROVIDER_SITE_OTHER): Payer: Self-pay | Admitting: Physician Assistant

## 2019-01-12 MED FILL — XOLAIR 150 MG SOLR: 150 | 28 days supply | Qty: 6 | Fill #4

## 2019-01-12 NOTE — Telephone Encounter (Signed)
Rx refill request

## 2019-01-15 ENCOUNTER — Other Ambulatory Visit (INDEPENDENT_AMBULATORY_CARE_PROVIDER_SITE_OTHER): Payer: Self-pay | Admitting: Physician Assistant

## 2019-01-15 MED ORDER — HYDROCODONE-ACETAMINOPHEN 5-325 MG PO TABS: 1.0000 | ORAL_TABLET | Freq: Four times a day (QID) | ORAL | 0 refills | Status: DC | PRN

## 2019-01-15 MED FILL — HYDROCODON-APAP 5-325: 5-325 | 8 days supply | Qty: 30 | Fill #0

## 2019-01-15 NOTE — Progress Notes (Signed)
   S: Patient presents for review of their specialty medication therapy.  Patient is currently taking Xolair for asthma. Patient is managed by Dr. Nunzio Cobbs for this.  Adherence: last dose 2 weeks ago; has appt for injection this Thursday.  Efficacy: reports doing well overall; endorses increased wheezing ~2-3 days before next injection  Dosing: SubQ 375 mg every 2 weeks   Dose adjustments: Renal: no dose adjustments  Hepatic: no dose adjustments  Toxicity: Severe hypersensitivity reaction or anaphylaxis: none Fever, arthralgia, and rash: None  Drug-drug interactions: none  Monitoring: CV effects: none Eosinophilia and vasculitis: none Fever/arthralgia/rash: none  Hypersensitivity/Anaphylaxis: none; endorses some minor itching at the injection site but denies s/sx of severe hypersensitivity or anaphylaxis Malignant neoplasms: none  Pulmonary function tests (for asthma): spirometry last resulted 07/2018. FEV1 in that study shows improvement compared to previous results.    O:  Lab Results  Component Value Date   WBC 16.8 (H) 11/11/2018   HGB 14.3 11/11/2018   HCT 40.4 11/11/2018   MCV 83.0 11/11/2018   PLT 215 11/11/2018     Chemistry      Component Value Date/Time   NA 131 (L) 11/11/2018 0351   K 3.9 11/11/2018 0351   CL 98 11/11/2018 0351   CO2 22 11/11/2018 0351   BUN 15 11/11/2018 0351   CREATININE 0.79 11/11/2018 0351   CREATININE 0.98 04/18/2013 1034      Component Value Date/Time   CALCIUM 8.5 (L) 11/11/2018 0351   ALKPHOS 59 03/13/2018 1437   AST 26 03/13/2018 1437   ALT 27 03/13/2018 1437   BILITOT 0.6 03/13/2018 1437     A/P: 1. Medication review: patient currently on Xolair for asthma. Reviewed the medication with the patient, including the following: Xolair, omalizumab, is a novel IgE blocker.  It appears to reduce rates of hospitalizations, ER visits and unscheduled physician visits due asthma exacerbations when added to standard therapy.  Studies  also show a reduction in steroid requirements and improvement in quality of life.  Patient educated on purpose, proper use and potential adverse effects of Xolair.  Following instruction patient verbalized understanding. Patient should always have an EpiPen readily available in the event of anaphylaxis.   SubQ: For SubQ injection only; doses >150 mg should be divided over more than one injection site (eg, 225 mg or 300 mg administered as two injections, 375 mg administered as three injections); each injection site should be separated by ?1 inch. Do not inject into moles, scars, bruises, tender areas, or broken skin. Injections may take 5 to 10 seconds to administer (solution is slightly viscous). Administer only under direct medical supervision and observe patient for 2 hours after the first 3 injections and 30 minutes after subsequent injections Angela Nevin 2015) or in accordance with individual institution policies and procedures.   Advised patient to discuss wheezing with Dr. Nunzio Cobbs. No recommendations for any changes at this time.   Butch Penny, PharmD, CPP Clinical Pharmacist [facility] [phone]

## 2019-01-16 ENCOUNTER — Ambulatory Visit: Payer: 59 | Attending: Family Medicine | Admitting: Pharmacist

## 2019-01-16 ENCOUNTER — Other Ambulatory Visit: Payer: Self-pay

## 2019-01-16 ENCOUNTER — Encounter: Payer: Self-pay | Admitting: Pharmacist

## 2019-01-16 ENCOUNTER — Encounter: Payer: Self-pay | Admitting: Allergy and Immunology

## 2019-01-16 ENCOUNTER — Ambulatory Visit (INDEPENDENT_AMBULATORY_CARE_PROVIDER_SITE_OTHER): Payer: 59 | Admitting: Allergy and Immunology

## 2019-01-16 ENCOUNTER — Telehealth: Payer: Self-pay

## 2019-01-16 DIAGNOSIS — Z79899 Other long term (current) drug therapy: Secondary | ICD-10-CM

## 2019-01-16 DIAGNOSIS — J4550 Severe persistent asthma, uncomplicated: Secondary | ICD-10-CM | POA: Diagnosis not present

## 2019-01-16 DIAGNOSIS — J3089 Other allergic rhinitis: Secondary | ICD-10-CM

## 2019-01-16 DIAGNOSIS — I10 Essential (primary) hypertension: Secondary | ICD-10-CM

## 2019-01-16 MED ORDER — TIOTROPIUM BROMIDE MONOHYDRATE 1.25 MCG/ACT IN AERS: 2.0000 | INHALATION_SPRAY | Freq: Every day | RESPIRATORY_TRACT | 3 refills | Status: DC

## 2019-01-16 MED ORDER — VENTOLIN HFA 108 (90 BASE) MCG/ACT IN AERS: 2.0000 | INHALATION_SPRAY | Freq: Four times a day (QID) | RESPIRATORY_TRACT | 3 refills | Status: DC | PRN

## 2019-01-16 MED ORDER — MONTELUKAST SODIUM 10 MG PO TABS: 10.0000 mg | ORAL_TABLET | Freq: Every day | ORAL | 5 refills | Status: DC

## 2019-01-16 MED ORDER — BUDESONIDE-FORMOTEROL FUMARATE 160-4.5 MCG/ACT IN AERO: INHALATION_SPRAY | RESPIRATORY_TRACT | 5 refills | Status: DC

## 2019-01-16 MED FILL — MONTELUKAST SOD 10 MG TAB: 10 | 30 days supply | Qty: 30 | Fill #0

## 2019-01-16 MED FILL — ALBUTEROL SULFATE HFA 108 (: 108 (90 BAS | 25 days supply | Qty: 18 | Fill #0

## 2019-01-16 MED FILL — SYMBICORT 160-4.5 MCG INH: 160-4.5 | 30 days supply | Qty: 10 | Fill #0

## 2019-01-16 MED FILL — SPIRIVA RESPIMAT 1.25 MCG I: 1.25 | 30 days supply | Qty: 4 | Fill #0

## 2019-01-16 NOTE — Assessment & Plan Note (Signed)
Currently with suboptimal control.  Continue omalizumab (Xolair) injections as prescribed, and albuterol every 6 hours if needed.  Start/restart Symbicort 160-4.5 g, 2 inhalations via spacer device twice daily, Spiriva Respimat, 1.25 g 2 inhalations daily, and montelukast 10 mg daily at bedtime.   Refill prescriptions have been provided.  The patient has been asked to contact me if his symptoms persist or progress. Otherwise, he may return for follow up in 1 month.

## 2019-01-16 NOTE — Assessment & Plan Note (Signed)
   For now, continue appropriate allergen avoidance measures, levocetirizine as needed, azelastine nasal spray as needed, and nasal saline irrigation if needed.  Restart montelukast.  Consider starting aero allergen immunotherapy injections when asthma is under better control in the context of Xolair bridge.

## 2019-01-16 NOTE — Progress Notes (Signed)
Follow-up Telemedicine Note  RE: Ryan Rudyrone Buis MRN: [id number] DOB: Nov 15, 1965 Date of Telemedicine Visit: 01/16/2019  Primary care provider: Etta GrandchildJones, Thomas L, MD Referring provider: Etta GrandchildJones, Thomas L, MD  Telemedicine Follow Up Visit via Telephone: I connected with [name] for a follow up on 01/16/19 by telephone and verified that I am speaking with the correct person using two identifiers.   The limitations, risks, security and privacy concerns of performing an evaluation and management service by telemedicine, the availability of in person appointments, and that there may be a patient responsible charge related to this service were discussed. The patient expressed understanding and agreed to proceed.  Patient is at home.  Provider is at the office.  Visit start time: 3:59 pm Visit end time: 4:31 pm Insurance consent/check in by: Victorino DikeJennifer Medical consent and medical assistant/nurse: Morrie SheldonAshley  History of present illness: [name] is a 53 y.o. male with severe persistent asthma and allergic rhinoconjunctivitis presenting today via telephone for sick visit.  He was last seen in this clinic in October 2019.  He reports that his asthma seems relatively well controlled for 8 to 10 days after his Xolair injection, however for the remaining 4 to 6 days experiences frequent asthma symptoms.  He states that over the past 4 nights he has been awakened from sleep due to lower respiratory symptoms.  He admits that he has not been taking Symbicort 160-4.5 g, Spiriva 1.25 g, or montelukast 10 mg daily.  His nasal and ocular allergy symptoms have been relatively well controlled with levocetirizine and/or azelastine nasal spray.  Assessment and plan: Severe persistent asthma Currently with suboptimal control.  Continue omalizumab (Xolair) injections as prescribed, and albuterol every 6 hours if needed.  Start/restart Symbicort 160-4.5 g, 2 inhalations via spacer device twice daily,  Spiriva Respimat, 1.25 g 2 inhalations daily, and montelukast 10 mg daily at bedtime.   Refill prescriptions have been provided.  The patient has been asked to contact me if his symptoms persist or progress. Otherwise, he may return for follow up in 1 month.  Perennial and seasonal allergic rhinitis  For now, continue appropriate allergen avoidance measures, levocetirizine as needed, azelastine nasal spray as needed, and nasal saline irrigation if needed.  Restart montelukast.  Consider starting aero allergen immunotherapy injections when asthma is under better control in the context of Xolair bridge.  Essential hypertension, benign  Continue monitoring blood pressure and taking antihypertensives as prescribed.  Follow-up with primary care as recommended.   Meds ordered this encounter  Medications  . VENTOLIN HFA 108 (90 Base) MCG/ACT inhaler    Sig: Inhale 2 puffs into the lungs every 6 (six) hours as needed.    Dispense:  1 Inhaler    Refill:  3  . montelukast (SINGULAIR) 10 MG tablet    Sig: Take 1 tablet (10 mg total) by mouth at bedtime.    Dispense:  30 tablet    Refill:  5  . budesonide-formoterol (SYMBICORT) 160-4.5 MCG/ACT inhaler    Sig: Take 2 puffs first thing in am and then another 2 puffs about 12 hours later.    Dispense:  1 Inhaler    Refill:  5  . Tiotropium Bromide Monohydrate (SPIRIVA RESPIMAT) 1.25 MCG/ACT AERS    Sig: Inhale 2 puffs into the lungs daily.    Dispense:  1 Inhaler    Refill:  3    Diagnostics: None.  Physical examination: Physical Exam Not obtained as encounter was done via telephone.  The following portions of the patient's history were reviewed and updated as appropriate: allergies, current medications, past family history, past medical history, past social history, past surgical history and problem list.  Allergies as of 01/16/2019   No Known Allergies     Medication List       Accurate as of January 16, 2019  8:58 PM.  Always use your most recent med list.        albuterol (2.5 MG/3ML) 0.083% nebulizer solution Commonly known as:  PROVENTIL Take 3 mLs (2.5 mg total) by nebulization every 4 (four) hours as needed for wheezing or shortness of breath.   Ventolin HFA 108 (90 Base) MCG/ACT inhaler Generic drug:  albuterol Inhale 2 puffs into the lungs every 6 (six) hours as needed.   aspirin 81 MG chewable tablet Commonly known as:  Aspirin Childrens Chew 1 tablet (81 mg total) by mouth 2 (two) times daily after a meal.   Azelastine HCl 0.15 % Soln Place 2 sprays into both nostrils 2 (two) times daily.   budesonide-formoterol 160-4.5 MCG/ACT inhaler Commonly known as:  Symbicort Take 2 puffs first thing in am and then another 2 puffs about 12 hours later.   Bystolic 5 MG tablet Generic drug:  nebivolol TAKE 1 TABLET BY MOUTH ONCE DAILY   diclofenac sodium 1 % Gel Commonly known as:  VOLTAREN Apply 4 g topically at bedtime as needed (pain).   EPINEPHrine 0.3 mg/0.3 mL Soaj injection Commonly known as:  EPI-PEN Inject 0.3 mg into the muscle as needed for anaphylaxis.   HYDROcodone-acetaminophen 5-325 MG tablet Commonly known as:  NORCO/VICODIN TAKE 1 TABLET BY MOUTH EVERY 6 (SIX) HOURS AS NEEDED FOR MODERATE PAIN.   ibuprofen 800 MG tablet Commonly known as:  ADVIL,MOTRIN Take 1 tablet (800 mg total) by mouth every 8 (eight) hours as needed.   methocarbamol 500 MG tablet Commonly known as:  Robaxin Take 1 tablet (500 mg total) by mouth every 6 (six) hours as needed for muscle spasms.   montelukast 10 MG tablet Commonly known as:  SINGULAIR Take 1 tablet (10 mg total) by mouth at bedtime.   omalizumab 150 MG injection Commonly known as:  Xolair INJECT 375 MG (3 INJECTIONS SPACING 1" APART) INTO THE SKIN EVERY 14 DAYS.   Tiotropium Bromide Monohydrate 1.25 MCG/ACT Aers Commonly known as:  Spiriva Respimat Inhale 2 puffs into the lungs daily.       No Known Allergies  Review of  systems: Review of systems negative except as noted in HPI / PMHx or noted below: Constitutional: Negative.  HENT: Negative.   Eyes: Negative.  Respiratory: Negative.   Cardiovascular: Negative.  Gastrointestinal: Negative.  Genitourinary: Negative.  Musculoskeletal: Negative.  Neurological: Negative.  Endo/Heme/Allergies: Negative.  Cutaneous: Negative.  Past Medical History:  Diagnosis Date  . Asthma   . Avascular necrosis (HCC)   . Hypertension   . Sarcoidosis   . Sarcoidosis   . Sickle cell anemia (HCC)   . Sickle cell trait (HCC)     Family History  Problem Relation Age of Onset  . Asthma Mother   . Coronary artery disease Mother   . Stroke Mother   . Heart attack Mother   . Sickle cell trait Mother   . Prostate cancer Father   . Cancer Father 34       prostate  . Alcohol abuse Brother   . Early death Brother   . Drug abuse Brother   . Diabetes Neg Hx   .  Heart disease Neg Hx   . Hyperlipidemia Neg Hx   . Hypertension Neg Hx   . Kidney disease Neg Hx     Social History   Socioeconomic History  . Marital status: Married    Spouse name: Not on file  . Number of children: Not on file  . Years of education: Not on file  . Highest education level: Not on file  Occupational History  . Not on file  Social Needs  . Financial resource strain: Not on file  . Food insecurity:    Worry: Not on file    Inability: Not on file  . Transportation needs:    Medical: Not on file    Non-medical: Not on file  Tobacco Use  . Smoking status: Former Smoker    Packs/day: 0.50    Years: 10.00    Pack years: 5.00    Last attempt to quit: 10/04/1993    Years since quitting: 25.3  . Smokeless tobacco: Never Used  Substance and Sexual Activity  . Alcohol use: Yes    Alcohol/week: 48.0 standard drinks    Types: 3 Standard drinks or equivalent, 25 Shots of liquor, 20 Cans of beer per week  . Drug use: No  . Sexual activity: Yes  Lifestyle  . Physical activity:     Days per week: Not on file    Minutes per session: Not on file  . Stress: Not on file  Relationships  . Social connections:    Talks on phone: Not on file    Gets together: Not on file    Attends religious service: Not on file    Active member of club or organization: Not on file    Attends meetings of clubs or organizations: Not on file    Relationship status: Not on file  . Intimate partner violence:    Fear of current or ex partner: Not on file    Emotionally abused: Not on file    Physically abused: Not on file    Forced sexual activity: Not on file  Other Topics Concern  . Not on file  Social History Narrative  . Not on file    Previous notes and tests were reviewed.  I discussed the assessment and treatment plan with the patient. The patient was provided an opportunity to ask questions and all were answered. The patient agreed with the plan and demonstrated an understanding of the instructions.   The patient was advised to call back or seek an in-person evaluation if the symptoms worsen or if the condition fails to improve as anticipated.  I provided 32 minutes of non-face-to-face time during this encounter.  I appreciate the opportunity to take part in [name]'s care. Please do not hesitate to contact me with questions.  Sincerely,   R. Jorene Guest, MD

## 2019-01-16 NOTE — Telephone Encounter (Signed)
Error

## 2019-01-16 NOTE — Patient Instructions (Signed)
Severe persistent asthma Currently with suboptimal control.  Continue omalizumab (Xolair) injections as prescribed, and albuterol every 6 hours if needed.  Start/restart Symbicort 160-4.5 g, 2 inhalations via spacer device twice daily, Spiriva Respimat, 1.25 g 2 inhalations daily, and montelukast 10 mg daily at bedtime.   Refill prescriptions have been provided.  The patient has been asked to contact me if his symptoms persist or progress. Otherwise, he may return for follow up in 1 month.  Perennial and seasonal allergic rhinitis  For now, continue appropriate allergen avoidance measures, levocetirizine as needed, azelastine nasal spray as needed, and nasal saline irrigation if needed.  Restart montelukast.  Consider starting aero allergen immunotherapy injections when asthma is under better control in the context of Xolair bridge.  Essential hypertension, benign  Continue monitoring blood pressure and taking antihypertensives as prescribed.  Follow-up with primary care as recommended.   Return in about 1 month (around 02/15/2019), or if symptoms worsen or fail to improve.

## 2019-01-16 NOTE — Assessment & Plan Note (Signed)
   Continue monitoring blood pressure and taking antihypertensives as prescribed.  Follow-up with primary care as recommended.

## 2019-01-18 ENCOUNTER — Ambulatory Visit (INDEPENDENT_AMBULATORY_CARE_PROVIDER_SITE_OTHER): Payer: 59 | Admitting: *Deleted

## 2019-01-18 DIAGNOSIS — J4550 Severe persistent asthma, uncomplicated: Secondary | ICD-10-CM | POA: Diagnosis not present

## 2019-01-23 ENCOUNTER — Encounter (INDEPENDENT_AMBULATORY_CARE_PROVIDER_SITE_OTHER): Payer: Self-pay | Admitting: Orthopaedic Surgery

## 2019-01-23 ENCOUNTER — Other Ambulatory Visit: Payer: Self-pay

## 2019-01-23 ENCOUNTER — Ambulatory Visit (INDEPENDENT_AMBULATORY_CARE_PROVIDER_SITE_OTHER): Payer: 59 | Admitting: Orthopaedic Surgery

## 2019-01-23 DIAGNOSIS — M87052 Idiopathic aseptic necrosis of left femur: Secondary | ICD-10-CM

## 2019-01-23 DIAGNOSIS — Z96641 Presence of right artificial hip joint: Secondary | ICD-10-CM

## 2019-01-23 MED ORDER — HYDROCODONE-ACETAMINOPHEN 5-325 MG PO TABS: 1.0000 | ORAL_TABLET | Freq: Four times a day (QID) | ORAL | 0 refills | Status: DC | PRN

## 2019-01-23 MED FILL — HYDROCODON-APAP 5-325: 5-325 | 8 days supply | Qty: 60 | Fill #0

## 2019-01-23 NOTE — Progress Notes (Signed)
The patient is 10 weeks status post a right total hip arthroplasty to treat avascular necrosis of the right hip.  He does have known avascular necrosis of the left hip but he has had no evidence of femoral head collapse and he is pain-free thus far on the left side.  He is now walking without assistive device he still having some right hip pain and some weakness but overall seems to doing well.  He is somewhat he does have severe allergies and is immunocompromise.  He sees an allergy specialist as well.  Both the allergy specialist and I agree that with his immunocompromise state he is at high risk if he is affected by COVID-19.  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  His right hip moves smoothly and his left hip move smoothly.  His pain seems to be diminishing.  I will send in some more hydrocodone for him but I told him to try to use these sparingly and that after 3 months I would like him off of narcotics from my standpoint.  Also do not need to see him back for 3 months.  At that visit I would like a standing low AP pelvis and lateral of both hips.

## 2019-02-01 ENCOUNTER — Ambulatory Visit (INDEPENDENT_AMBULATORY_CARE_PROVIDER_SITE_OTHER): Payer: 59

## 2019-02-01 ENCOUNTER — Other Ambulatory Visit: Payer: Self-pay

## 2019-02-01 DIAGNOSIS — J4550 Severe persistent asthma, uncomplicated: Secondary | ICD-10-CM | POA: Diagnosis not present

## 2019-02-03 MED FILL — SPIRIVA RESPIMAT 1.25 MCG I: 1.25 | 30 days supply | Qty: 4 | Fill #1

## 2019-02-03 MED FILL — ALBUTEROL SULFATE HFA 108 (: 108 (90 BAS | 25 days supply | Qty: 18 | Fill #1

## 2019-02-03 MED FILL — SYMBICORT 160-4.5 MCG INH: 160-4.5 | 30 days supply | Qty: 10 | Fill #1

## 2019-02-03 MED FILL — MONTELUKAST SOD 10 MG TAB: 10 | 30 days supply | Qty: 30 | Fill #1

## 2019-02-03 MED FILL — XOLAIR 150 MG SOLR: 150 | 28 days supply | Qty: 6 | Fill #5

## 2019-02-15 ENCOUNTER — Ambulatory Visit (INDEPENDENT_AMBULATORY_CARE_PROVIDER_SITE_OTHER): Payer: 59

## 2019-02-15 ENCOUNTER — Other Ambulatory Visit: Payer: Self-pay

## 2019-02-15 DIAGNOSIS — J4550 Severe persistent asthma, uncomplicated: Secondary | ICD-10-CM

## 2019-02-21 DIAGNOSIS — R972 Elevated prostate specific antigen [PSA]: Secondary | ICD-10-CM | POA: Diagnosis not present

## 2019-02-21 LAB — PSA: PSA: 1.49

## 2019-02-28 ENCOUNTER — Other Ambulatory Visit: Payer: Self-pay

## 2019-02-28 ENCOUNTER — Ambulatory Visit (INDEPENDENT_AMBULATORY_CARE_PROVIDER_SITE_OTHER): Payer: 59 | Admitting: *Deleted

## 2019-02-28 DIAGNOSIS — J4550 Severe persistent asthma, uncomplicated: Secondary | ICD-10-CM | POA: Diagnosis not present

## 2019-02-28 DIAGNOSIS — N402 Nodular prostate without lower urinary tract symptoms: Secondary | ICD-10-CM | POA: Diagnosis not present

## 2019-02-28 DIAGNOSIS — R972 Elevated prostate specific antigen [PSA]: Secondary | ICD-10-CM | POA: Diagnosis not present

## 2019-03-01 ENCOUNTER — Other Ambulatory Visit (HOSPITAL_COMMUNITY): Payer: Self-pay | Admitting: Urology

## 2019-03-01 ENCOUNTER — Ambulatory Visit: Payer: Self-pay

## 2019-03-01 ENCOUNTER — Other Ambulatory Visit: Payer: Self-pay | Admitting: Urology

## 2019-03-01 DIAGNOSIS — R972 Elevated prostate specific antigen [PSA]: Secondary | ICD-10-CM

## 2019-03-01 MED FILL — DIAZEPAM 10 MG TABS: 10 | 1 days supply | Qty: 1 | Fill #0

## 2019-03-05 ENCOUNTER — Other Ambulatory Visit: Payer: Self-pay

## 2019-03-05 ENCOUNTER — Encounter: Payer: Self-pay | Admitting: Allergy and Immunology

## 2019-03-05 ENCOUNTER — Ambulatory Visit (INDEPENDENT_AMBULATORY_CARE_PROVIDER_SITE_OTHER): Payer: 59 | Admitting: Allergy and Immunology

## 2019-03-05 VITALS — BP 154/92 | HR 110 | Temp 98.7°F | Resp 20

## 2019-03-05 DIAGNOSIS — I10 Essential (primary) hypertension: Secondary | ICD-10-CM

## 2019-03-05 DIAGNOSIS — J3089 Other allergic rhinitis: Secondary | ICD-10-CM

## 2019-03-05 DIAGNOSIS — J4551 Severe persistent asthma with (acute) exacerbation: Secondary | ICD-10-CM | POA: Diagnosis not present

## 2019-03-05 MED ORDER — BUDESONIDE-FORMOTEROL FUMARATE 160-4.5 MCG/ACT IN AERO: INHALATION_SPRAY | RESPIRATORY_TRACT | 5 refills | Status: DC

## 2019-03-05 MED ORDER — MONTELUKAST SODIUM 10 MG PO TABS: 10.0000 mg | ORAL_TABLET | Freq: Every day | ORAL | 5 refills | Status: DC

## 2019-03-05 MED ORDER — ALBUTEROL SULFATE (2.5 MG/3ML) 0.083% IN NEBU: 2.5000 mg | INHALATION_SOLUTION | RESPIRATORY_TRACT | 1 refills | Status: DC | PRN

## 2019-03-05 MED ORDER — TIOTROPIUM BROMIDE MONOHYDRATE 1.25 MCG/ACT IN AERS: 2.0000 | INHALATION_SPRAY | Freq: Every day | RESPIRATORY_TRACT | 3 refills | Status: DC

## 2019-03-05 MED ORDER — PREDNISONE 1 MG PO TABS: 10.0000 mg | ORAL_TABLET | Freq: Every day | ORAL | Status: DC

## 2019-03-05 MED FILL — SYMBICORT 160-4.5 MCG INH: 160-4.5 | 30 days supply | Qty: 10 | Fill #0

## 2019-03-05 MED FILL — ALBUTEROL 0.083 MG/ML SOLN: (2.5 MG/3ML | 5 days supply | Qty: 90 | Fill #0

## 2019-03-05 MED FILL — MONTELUKAST SOD 10 MG TAB: 10 | 90 days supply | Qty: 90 | Fill #0

## 2019-03-05 MED FILL — SPIRIVA RESPIMAT 1.25 MCG I: 1.25 | 30 days supply | Qty: 4 | Fill #0

## 2019-03-05 NOTE — Assessment & Plan Note (Addendum)
Currently with suboptimal control.  Secondhand cigarette smoke should be strictly eliminated from the patients environment.  I have also recommended using the air conditioner at nighttime.  Continue omalizumab (Xolair) injections as prescribed, Symbicort 160-4.5 g, 2 inhalations via spacer device twice daily, montelukast 10 mg daily at bedtime, and albuterol every 6 hours if needed.  The montelukast boxed warning has been discussed and the patient has verbalized understanding.  Start/restart Spiriva Respimat, 1.25 g 2 inhalations daily.   If symptoms persist or progress despite treatment plan as outlined above, prednisone has been provided, 20 mg x 4 days, 10 mg x1 day, then stop, and the patient is to contact me.  Refill prescriptions have been provided.  The patient has been asked to contact me if his symptoms persist or progress. Otherwise, he may return for follow up in 1 month.

## 2019-03-05 NOTE — Assessment & Plan Note (Addendum)
   For now, continue appropriate allergen avoidance measures, montelukast 10 mg daily, levocetirizine as needed, and azelastine nasal spray as needed.  Nasal saline spray (i.e., Simply Saline) or nasal saline lavage (i.e., NeilMed) is recommended as needed and prior to medicated nasal sprays.  Consider starting aeroallergen immunotherapy injections when asthma is under better control in the context of Xolair bridge.

## 2019-03-05 NOTE — Patient Instructions (Addendum)
Severe persistent asthma Currently with suboptimal control.  Secondhand cigarette smoke should be strictly eliminated from the patients environment.  I have also recommended using the air conditioner at nighttime.  Continue omalizumab (Xolair) injections as prescribed, Symbicort 160-4.5 g, 2 inhalations via spacer device twice daily, montelukast 10 mg daily at bedtime, and albuterol every 6 hours if needed.  The montelukast boxed warning has been discussed and the patient has verbalized understanding.  Start/restart Spiriva Respimat, 1.25 g 2 inhalations daily.   If symptoms persist or progress despite treatment plan as outlined above, prednisone has been provided, 20 mg x 4 days, 10 mg x1 day, then stop, and the patient is to contact me.  Refill prescriptions have been provided.  The patient has been asked to contact me if his symptoms persist or progress. Otherwise, he may return for follow up in 1 month.  Perennial and seasonal allergic rhinitis  For now, continue appropriate allergen avoidance measures, montelukast 10 mg daily, levocetirizine as needed, and azelastine nasal spray as needed.  Nasal saline spray (i.e., Simply Saline) or nasal saline lavage (i.e., NeilMed) is recommended as needed and prior to medicated nasal sprays.  Consider starting aeroallergen immunotherapy injections when asthma is under better control in the context of Xolair bridge.  Essential hypertension, benign  The patient has been made aware of the elevated blood pressure reading and has been encouraged to follow up with his primary care physician in the near future regarding this issue.  The importance of compliance with antihypertensive medication has been emphasized.  [name] has verbalized understanding and agreed to do so.   Return in about 4 months (around 07/05/2019), or if symptoms worsen or fail to improve.

## 2019-03-05 NOTE — Assessment & Plan Note (Signed)
   The patient has been made aware of the elevated blood pressure reading and has been encouraged to follow up with his primary care physician in the near future regarding this issue.  The importance of compliance with antihypertensive medication has been emphasized.  [name] has verbalized understanding and agreed to do so.

## 2019-03-05 NOTE — Progress Notes (Signed)
Follow-up Note  RE: [name] MRN: [id number] DOB: August 09, 1966 Date of Office Visit: 03/05/2019  Primary care provider: Etta Grandchild, MD Referring provider: Etta Grandchild, MD  History of present illness: [name] is a 53 y.o. male with persistent asthma and allergic rhinoconjunctivitis presenting today for follow-up.  His last visit was via telemedicine on January 16, 2019. He reports that recently he has been waking up almost every night with chest tightness which resolves with albuterol use.  He also notes that he has been sweating because the family has not been using the air conditioner at night and he believes that the elevated room temperature is causing sweating and exacerbating his asthma.  He admits that he has not been taking Spiriva as previously recommended.  He has been receiving Xolair injections and taking Symbicort 160-4.5 g, 2 inhalations via spacer device twice daily, and montelukast.  He reports that his wife still smokes cigarettes in the home.  Waking up at night sweating with chest pain, resolves with asthma .  [name]'s blood pressure is elevated today and he admits that he has not been taking his antihypertensive medication.  Assessment and plan: Severe persistent asthma Currently with suboptimal control.  Secondhand cigarette smoke should be strictly eliminated from the patients environment.  I have also recommended using the air conditioner at nighttime.  Continue omalizumab (Xolair) injections as prescribed, Symbicort 160-4.5 g, 2 inhalations via spacer device twice daily, montelukast 10 mg daily at bedtime, and albuterol every 6 hours if needed.  The montelukast boxed warning has been discussed and the patient has verbalized understanding.  Start/restart Spiriva Respimat, 1.25 g 2 inhalations daily.   If symptoms persist or progress despite treatment plan as outlined above, prednisone has been provided, 20 mg x 4 days, 10 mg x1 day, then stop,  and the patient is to contact me.  Refill prescriptions have been provided.  The patient has been asked to contact me if his symptoms persist or progress. Otherwise, he may return for follow up in 1 month.  Perennial and seasonal allergic rhinitis  For now, continue appropriate allergen avoidance measures, montelukast 10 mg daily, levocetirizine as needed, and azelastine nasal spray as needed.  Nasal saline spray (i.e., Simply Saline) or nasal saline lavage (i.e., NeilMed) is recommended as needed and prior to medicated nasal sprays.  Consider starting aeroallergen immunotherapy injections when asthma is under better control in the context of Xolair bridge.  Essential hypertension, benign  The patient has been made aware of the elevated blood pressure reading and has been encouraged to follow up with his primary care physician in the near future regarding this issue.  The importance of compliance with antihypertensive medication has been emphasized.  [name] has verbalized understanding and agreed to do so.   Meds ordered this encounter  Medications  . predniSONE (DELTASONE) tablet 10 mg  . albuterol (PROVENTIL) (2.5 MG/3ML) 0.083% nebulizer solution    Sig: Take 3 mLs (2.5 mg total) by nebulization every 4 (four) hours as needed for wheezing or shortness of breath.    Dispense:  75 mL    Refill:  1  . budesonide-formoterol (SYMBICORT) 160-4.5 MCG/ACT inhaler    Sig: Take 2 puffs first thing in am and then another 2 puffs about 12 hours later.    Dispense:  1 Inhaler    Refill:  5  . montelukast (SINGULAIR) 10 MG tablet    Sig: Take 1 tablet (10 mg total) by mouth at bedtime.  Dispense:  30 tablet    Refill:  5  . Tiotropium Bromide Monohydrate (SPIRIVA RESPIMAT) 1.25 MCG/ACT AERS    Sig: Inhale 2 puffs into the lungs daily.    Dispense:  1 Inhaler    Refill:  3    Diagnostics: Spirometry reveals an FVC of 2.31 L (57% predicted) and an FEV1 of 1.63 L (50% predicted).  Please  see scanned spirometry results for details.    Physical examination: Blood pressure (!) 154/92, pulse (!) 110, temperature 98.7 F (37.1 C), resp. rate 20, SpO2 96 %.  General: Alert, interactive, in no acute distress. HEENT: TMs pearly gray, turbinates moderately edematous without discharge, post-pharynx mildly erythematous. Neck: Supple without lymphadenopathy. Lungs: Mildly decreased breath sounds bilaterally without wheezing, rhonchi or rales. CV: Normal S1, S2 without murmurs. Skin: Warm and dry, without lesions or rashes.  The following portions of the patient's history were reviewed and updated as appropriate: allergies, current medications, past family history, past medical history, past social history, past surgical history and problem list.  Allergies as of 03/05/2019   No Known Allergies     Medication List       Accurate as of March 05, 2019  2:53 PM. If you have any questions, ask your nurse or doctor.        aspirin 81 MG chewable tablet Commonly known as:  Aspirin Childrens Chew 1 tablet (81 mg total) by mouth 2 (two) times daily after a meal.   Azelastine HCl 0.15 % Soln Place 2 sprays into both nostrils 2 (two) times daily. What changed:    when to take this  reasons to take this   budesonide-formoterol 160-4.5 MCG/ACT inhaler Commonly known as:  Symbicort Take 2 puffs first thing in am and then another 2 puffs about 12 hours later.   Bystolic 5 MG tablet Generic drug:  nebivolol TAKE 1 TABLET BY MOUTH ONCE DAILY   diclofenac sodium 1 % Gel Commonly known as:  VOLTAREN Apply 4 g topically at bedtime as needed (pain).   EPINEPHrine 0.3 mg/0.3 mL Soaj injection Commonly known as:  EPI-PEN Inject 0.3 mg into the muscle as needed for anaphylaxis.   HYDROcodone-acetaminophen 5-325 MG tablet Commonly known as:  NORCO/VICODIN Take 1-2 tablets by mouth every 6 (six) hours as needed for moderate pain.   ibuprofen 800 MG tablet Commonly known as:  ADVIL  Take 1 tablet (800 mg total) by mouth every 8 (eight) hours as needed. What changed:  reasons to take this   methocarbamol 500 MG tablet Commonly known as:  Robaxin Take 1 tablet (500 mg total) by mouth every 6 (six) hours as needed for muscle spasms.   montelukast 10 MG tablet Commonly known as:  SINGULAIR Take 1 tablet (10 mg total) by mouth at bedtime.   omalizumab 150 MG injection Commonly known as:  Xolair INJECT 375 MG (3 INJECTIONS SPACING 1" APART) INTO THE SKIN EVERY 14 DAYS. What changed:    how much to take  how to take this  when to take this  additional instructions   Tiotropium Bromide Monohydrate 1.25 MCG/ACT Aers Commonly known as:  Spiriva Respimat Inhale 2 puffs into the lungs daily. What changed:  Another medication with the same name was added. Make sure you understand how and when to take each. Changed by:  Wellington Hampshire, MD   Tiotropium Bromide Monohydrate 1.25 MCG/ACT Aers Commonly known as:  Spiriva Respimat Inhale 2 puffs into the lungs daily. What changed:  You were  already taking a medication with the same name, and this prescription was added. Make sure you understand how and when to take each. Changed by:  Wellington Hampshire, MD   Ventolin HFA 108 (90 Base) MCG/ACT inhaler Generic drug:  albuterol Inhale 2 puffs into the lungs every 6 (six) hours as needed.   albuterol (2.5 MG/3ML) 0.083% nebulizer solution Commonly known as:  PROVENTIL Take 3 mLs (2.5 mg total) by nebulization every 4 (four) hours as needed for wheezing or shortness of breath.       No Known Allergies  Review of systems: Review of systems negative except as noted in HPI / PMHx or noted below: Constitutional: Negative.  HENT: Negative.   Eyes: Negative.  Respiratory: Negative.   Cardiovascular: Negative.  Gastrointestinal: Negative.  Genitourinary: Negative.  Musculoskeletal: Negative.  Neurological: Negative.  Endo/Heme/Allergies: Negative.  Cutaneous:  Negative.  Past Medical History:  Diagnosis Date  . Asthma   . Avascular necrosis (HCC)   . Hypertension   . Sarcoidosis   . Sarcoidosis   . Sickle cell anemia (HCC)   . Sickle cell trait (HCC)     Family History  Problem Relation Age of Onset  . Asthma Mother   . Coronary artery disease Mother   . Stroke Mother   . Heart attack Mother   . Sickle cell trait Mother   . Prostate cancer Father   . Cancer Father 16       prostate  . Alcohol abuse Brother   . Early death Brother   . Drug abuse Brother   . Diabetes Neg Hx   . Heart disease Neg Hx   . Hyperlipidemia Neg Hx   . Hypertension Neg Hx   . Kidney disease Neg Hx     Social History   Socioeconomic History  . Marital status: Married    Spouse name: Not on file  . Number of children: Not on file  . Years of education: Not on file  . Highest education level: Not on file  Occupational History  . Not on file  Social Needs  . Financial resource strain: Not on file  . Food insecurity:    Worry: Not on file    Inability: Not on file  . Transportation needs:    Medical: Not on file    Non-medical: Not on file  Tobacco Use  . Smoking status: Former Smoker    Packs/day: 0.50    Years: 10.00    Pack years: 5.00    Last attempt to quit: 10/04/1993    Years since quitting: 25.4  . Smokeless tobacco: Never Used  Substance and Sexual Activity  . Alcohol use: Yes    Alcohol/week: 48.0 standard drinks    Types: 3 Standard drinks or equivalent, 25 Shots of liquor, 20 Cans of beer per week  . Drug use: No  . Sexual activity: Yes  Lifestyle  . Physical activity:    Days per week: Not on file    Minutes per session: Not on file  . Stress: Not on file  Relationships  . Social connections:    Talks on phone: Not on file    Gets together: Not on file    Attends religious service: Not on file    Active member of club or organization: Not on file    Attends meetings of clubs or organizations: Not on file     Relationship status: Not on file  . Intimate partner violence:    Fear of  current or ex partner: Not on file    Emotionally abused: Not on file    Physically abused: Not on file    Forced sexual activity: Not on file  Other Topics Concern  . Not on file  Social History Narrative  . Not on file    I appreciate the opportunity to take part in [name]'s care. Please do not hesitate to contact me with questions.  Sincerely,   R. Jorene Guestarter Navea Woodrow, MD

## 2019-03-14 ENCOUNTER — Telehealth: Payer: Self-pay | Admitting: Allergy and Immunology

## 2019-03-14 ENCOUNTER — Encounter: Payer: Self-pay | Admitting: *Deleted

## 2019-03-14 ENCOUNTER — Ambulatory Visit: Payer: Self-pay

## 2019-03-14 NOTE — Telephone Encounter (Signed)
Letter printed waiting for sig

## 2019-03-14 NOTE — Telephone Encounter (Signed)
Pt aware that letter was ready. He would like to pick it up at the [facility] location. Faxed to [place].

## 2019-03-14 NOTE — Telephone Encounter (Signed)
Please print the following letter on letter-head and put in on my desk in [place] for signature. >>>>>  March 14, 2019  To whom it may concern:  [name] is a patient of mine with persistent asthma at [facility]. He has requested a letter so that he may return to work. He may he may be at higher risk of complications from [id number]. Therefore, I recommend that all reasonable measures be taken to reduce the risk of COVID19 exposure while at the place of work. He should not work in any areas where there are patients or staff who have Ola.  Please do not hesitate to contact my office with questions or concerns.  Sincerely,    R. Edgar Frisk, MD

## 2019-03-14 NOTE — Telephone Encounter (Signed)
Dr Verlin Fester please advise on letter

## 2019-03-14 NOTE — Telephone Encounter (Signed)
Patient is calling stating that he needs a return to work letter Patient needs to go back to work There is no COVID19 patients where he is working  Can patient pick up the letter today or tomorrow?? Please call patient to answer any questions And let him know when the letter is ready for pick up

## 2019-03-14 NOTE — Telephone Encounter (Signed)
Patient is currently not working. 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 States if we want more info we can contact his director Laurell Josephs at [phone]. Patient would like to go back to work on Monday June 15 if possible please advise

## 2019-03-14 NOTE — Telephone Encounter (Signed)
Where is the patient now working?  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

## 2019-03-15 ENCOUNTER — Ambulatory Visit (INDEPENDENT_AMBULATORY_CARE_PROVIDER_SITE_OTHER): Payer: 59 | Admitting: *Deleted

## 2019-03-15 DIAGNOSIS — J4551 Severe persistent asthma with (acute) exacerbation: Secondary | ICD-10-CM

## 2019-03-16 ENCOUNTER — Telehealth: Payer: Self-pay | Admitting: Emergency Medicine

## 2019-03-16 ENCOUNTER — Other Ambulatory Visit: Payer: Self-pay

## 2019-03-16 ENCOUNTER — Ambulatory Visit (HOSPITAL_COMMUNITY)
Admission: RE | Admit: 2019-03-16 | Discharge: 2019-03-16 | Disposition: A | Discharge status: Home / Self Care | Payer: 59 | Source: Ambulatory Visit | Attending: Urology | Admitting: Urology

## 2019-03-16 DIAGNOSIS — R972 Elevated prostate specific antigen [PSA]: Secondary | ICD-10-CM | POA: Diagnosis not present

## 2019-03-16 MED ORDER — LIDOCAINE HCL URETHRAL/MUCOSAL 2 % EX GEL: 1.0000 "application " | Freq: Once | CUTANEOUS | Status: DC

## 2019-03-16 MED ORDER — GADOBUTROL 1 MMOL/ML IV SOLN
7.5000 mL | Freq: Once | INTRAVENOUS | Status: AC | PRN
  Administered 2019-03-16: 7.5 mL via INTRAVENOUS

## 2019-03-16 MED ORDER — LIDOCAINE HCL URETHRAL/MUCOSAL 2 % EX GEL
CUTANEOUS | Status: AC
  Filled 2019-03-16: qty 30

## 2019-03-16 NOTE — Telephone Encounter (Signed)
Pt has been out of work with a hip replacement since Feb and is going back to work on 6/22. Would like to know if he can be worked in for a physical next week? Please advise

## 2019-03-19 NOTE — Telephone Encounter (Signed)
Tammy,  Can you add him to 4pm on Wednesday please?

## 2019-03-19 NOTE — Telephone Encounter (Signed)
done

## 2019-03-21 ENCOUNTER — Other Ambulatory Visit: Payer: Self-pay

## 2019-03-21 ENCOUNTER — Encounter: Payer: Self-pay | Admitting: Internal Medicine

## 2019-03-21 ENCOUNTER — Ambulatory Visit (INDEPENDENT_AMBULATORY_CARE_PROVIDER_SITE_OTHER): Payer: 59 | Admitting: Internal Medicine

## 2019-03-21 ENCOUNTER — Other Ambulatory Visit (INDEPENDENT_AMBULATORY_CARE_PROVIDER_SITE_OTHER): Payer: 59

## 2019-03-21 VITALS — BP 124/86 | HR 82 | Temp 97.8°F | Resp 16 | Ht 71.0 in | Wt 168.0 lb

## 2019-03-21 DIAGNOSIS — R739 Hyperglycemia, unspecified: Secondary | ICD-10-CM

## 2019-03-21 DIAGNOSIS — Z0000 Encounter for general adult medical examination without abnormal findings: Secondary | ICD-10-CM

## 2019-03-21 DIAGNOSIS — I10 Essential (primary) hypertension: Secondary | ICD-10-CM

## 2019-03-21 DIAGNOSIS — Z1211 Encounter for screening for malignant neoplasm of colon: Secondary | ICD-10-CM | POA: Insufficient documentation

## 2019-03-21 DIAGNOSIS — E781 Pure hyperglyceridemia: Secondary | ICD-10-CM | POA: Diagnosis not present

## 2019-03-21 LAB — COMPREHENSIVE METABOLIC PANEL
ALT: 44 U/L (ref 0–53)
AST: 32 U/L (ref 0–37)
Albumin: 4.4 g/dL (ref 3.5–5.2)
Alkaline Phosphatase: 113 U/L (ref 39–117)
BUN: 14 mg/dL (ref 6–23)
CO2: 29 mEq/L (ref 19–32)
Calcium: 9.1 mg/dL (ref 8.4–10.5)
Chloride: 103 mEq/L (ref 96–112)
Creatinine, Ser: 0.92 mg/dL (ref 0.40–1.50)
GFR: 105 mL/min (ref >60.00)
Glucose, Bld: 102 mg/dL — ABNORMAL HIGH (ref 70–99)
Potassium: 3.7 mEq/L (ref 3.5–5.1)
Sodium: 140 mEq/L (ref 135–145)
Total Bilirubin: 0.5 mg/dL (ref 0.2–1.2)
Total Protein: 7.9 g/dL (ref 6.0–8.3)

## 2019-03-21 LAB — CBC WITH DIFFERENTIAL/PLATELET
Basophils Absolute: 0 10*3/uL (ref 0.0–0.1)
Basophils Relative: 0.6 % (ref 0.0–3.0)
Eosinophils Absolute: 0.1 10*3/uL (ref 0.0–0.7)
Eosinophils Relative: 2.5 % (ref 0.0–5.0)
HCT: 48.2 % (ref 39.0–52.0)
Hemoglobin: 16.9 g/dL (ref 13.0–17.0)
Lymphocytes Relative: 28.6 % (ref 12.0–46.0)
Lymphs Abs: 1.5 10*3/uL (ref 0.7–4.0)
MCHC: 35 g/dL (ref 30.0–36.0)
MCV: 81.3 fl (ref 78.0–100.0)
Monocytes Absolute: 0.6 10*3/uL (ref 0.1–1.0)
Monocytes Relative: 11.6 % (ref 3.0–12.0)
Neutro Abs: 3 10*3/uL (ref 1.4–7.7)
Neutrophils Relative %: 56.7 % (ref 43.0–77.0)
Platelets: 241 10*3/uL (ref 150.0–400.0)
RBC: 5.93 Mil/uL — ABNORMAL HIGH (ref 4.22–5.81)
RDW: 14.3 % (ref 11.5–15.5)
WBC: 5.3 10*3/uL (ref 4.0–10.5)

## 2019-03-21 LAB — LIPID PANEL
Cholesterol: 197 mg/dL (ref 0–200)
HDL: 50.9 mg/dL (ref >39.00)
NonHDL: 146.09
Total CHOL/HDL Ratio: 4
Triglycerides: 326 mg/dL — ABNORMAL HIGH (ref 0.0–149.0)
VLDL: 65.2 mg/dL — ABNORMAL HIGH (ref 0.0–40.0)

## 2019-03-21 LAB — LDL CHOLESTEROL, DIRECT: Direct LDL: 113 mg/dL

## 2019-03-21 LAB — HEMOGLOBIN A1C: Hgb A1c MFr Bld: 5.6 % (ref 4.6–6.5)

## 2019-03-21 LAB — TSH: TSH: 2.05 u[IU]/mL (ref 0.35–4.50)

## 2019-03-21 MED ORDER — VASCEPA 1 G PO CAPS: 2.0000 | ORAL_CAPSULE | Freq: Two times a day (BID) | ORAL | 1 refills | Status: DC

## 2019-03-21 MED FILL — XOLAIR 150 MG SOLR: 150 | 28 days supply | Qty: 6 | Fill #6

## 2019-03-21 NOTE — Progress Notes (Signed)
Subjective:  Patient ID: [name], male    DOB: [birth year]  Age: 53 y.o. MRN: [id number]  CC: Annual Exam and Hypertension   HPI [name] presents for a CPX.    Outpatient Medications Prior to Visit  Medication Sig Dispense Refill  . albuterol (PROVENTIL) (2.5 MG/3ML) 0.083% nebulizer solution Take 3 mLs (2.5 mg total) by nebulization every 4 (four) hours as needed for wheezing or shortness of breath. 75 mL 1  . aspirin (ASPIRIN CHILDRENS) 81 MG chewable tablet Chew 1 tablet (81 mg total) by mouth 2 (two) times daily after a meal. 30 tablet 0  . Azelastine HCl 0.15 % SOLN Place 2 sprays into both nostrils 2 (two) times daily. (Patient taking differently: Place 2 sprays into both nostrils 2 (two) times daily as needed (congestion). ) 30 mL 5  . budesonide-formoterol (SYMBICORT) 160-4.5 MCG/ACT inhaler Take 2 puffs first thing in am and then another 2 puffs about 12 hours later. 1 Inhaler 5  . BYSTOLIC 5 MG tablet TAKE 1 TABLET BY MOUTH ONCE DAILY 90 tablet 1  . diclofenac sodium (VOLTAREN) 1 % GEL Apply 4 g topically at bedtime as needed (pain).   1  . EPINEPHrine 0.3 mg/0.3 mL IJ SOAJ injection Inject 0.3 mg into the muscle as needed for anaphylaxis.   2  . ibuprofen (ADVIL,MOTRIN) 800 MG tablet Take 1 tablet (800 mg total) by mouth every 8 (eight) hours as needed. (Patient taking differently: Take 800 mg by mouth every 8 (eight) hours as needed for moderate pain. ) 270 tablet 0  . methocarbamol (ROBAXIN) 500 MG tablet Take 1 tablet (500 mg total) by mouth every 6 (six) hours as needed for muscle spasms. 40 tablet 1  . montelukast (SINGULAIR) 10 MG tablet Take 1 tablet (10 mg total) by mouth at bedtime. 30 tablet 5  . omalizumab (XOLAIR) 150 MG injection INJECT 375 MG (3 INJECTIONS SPACING 1" APART) INTO THE SKIN EVERY 14 DAYS. (Patient taking differently: Inject 375 mg into the skin every 14 (fourteen) days. ) 6 each 11  . Tiotropium Bromide Monohydrate (SPIRIVA RESPIMAT) 1.25  MCG/ACT AERS Inhale 2 puffs into the lungs daily. 1 Inhaler 3  . VENTOLIN HFA 108 (90 Base) MCG/ACT inhaler Inhale 2 puffs into the lungs every 6 (six) hours as needed. 1 Inhaler 3  . HYDROcodone-acetaminophen (NORCO/VICODIN) 5-325 MG tablet Take 1-2 tablets by mouth every 6 (six) hours as needed for moderate pain. 60 tablet 0  . Tiotropium Bromide Monohydrate (SPIRIVA RESPIMAT) 1.25 MCG/ACT AERS Inhale 2 puffs into the lungs daily. 1 Inhaler 3  . omalizumab Arvid Right) injection 375 mg     . predniSONE (DELTASONE) tablet 10 mg      No facility-administered medications prior to visit.     ROS Review of Systems  Constitutional: Negative.  Negative for diaphoresis and fatigue.  HENT: Negative.   Eyes: Negative for visual disturbance.  Respiratory: Negative for cough, chest tightness, shortness of breath and wheezing.   Cardiovascular: Negative for chest pain, palpitations and leg swelling.  Gastrointestinal: Negative for abdominal pain, constipation, diarrhea, nausea and vomiting.  Genitourinary: Negative.  Negative for difficulty urinating and dysuria.  Musculoskeletal: Positive for arthralgias and back pain. Negative for myalgias and neck pain.  Skin: Negative.  Negative for color change and pallor.  Neurological: Negative.  Negative for dizziness, weakness, light-headedness and headaches.  Hematological: Negative for adenopathy. Does not bruise/bleed easily.  Psychiatric/Behavioral: Negative.     Objective:  BP 124/86 (BP Location: Left  Arm, Patient Position: Sitting, Cuff Size: Normal)   Pulse 82   Temp 97.8 F (36.6 C) (Oral)   Resp 16   Ht 5\' 11"  (1.803 m)   Wt 168 lb (76.2 kg)   SpO2 98%   BMI 23.43 kg/m   BP Readings from Last 3 Encounters:  03/21/19 124/86  03/05/19 (!) 154/92  11/12/18 122/90    Wt Readings from Last 3 Encounters:  03/21/19 168 lb (76.2 kg)  11/10/18 158 lb 3.2 oz (71.8 kg)  11/02/18 158 lb 3.2 oz (71.8 kg)    Physical Exam Vitals signs  reviewed.  Constitutional:      Appearance: He is not ill-appearing or diaphoretic.  HENT:     Nose: No congestion or rhinorrhea.     Mouth/Throat:     Mouth: Mucous membranes are moist.     Pharynx: No oropharyngeal exudate.  Eyes:     General: No scleral icterus.    Conjunctiva/sclera: Conjunctivae normal.  Neck:     Musculoskeletal: Normal range of motion. No neck rigidity.  Cardiovascular:     Rate and Rhythm: Normal rate and regular rhythm.     Heart sounds: No murmur.  Pulmonary:     Effort: Pulmonary effort is normal. No respiratory distress.     Breath sounds: No stridor. No wheezing, rhonchi or rales.  Abdominal:     General: Abdomen is flat.     Palpations: There is no mass.     Tenderness: There is no abdominal tenderness. There is no guarding.  Genitourinary:    Comments: He saw his urologist and underwent an MRI of his pelvis a week ago so this exam was deferred. Musculoskeletal: Normal range of motion.     Right lower leg: No edema.     Left lower leg: No edema.  Lymphadenopathy:     Cervical: No cervical adenopathy.  Skin:    General: Skin is warm.     Coloration: Skin is not jaundiced.     Findings: No rash.  Neurological:     General: No focal deficit present.  Psychiatric:        Mood and Affect: Mood normal.        Behavior: Behavior normal.     Lab Results  Component Value Date   WBC 5.3 03/21/2019   HGB 16.9 03/21/2019   HCT 48.2 03/21/2019   PLT 241.0 03/21/2019   GLUCOSE 102 (H) 03/21/2019   CHOL 197 03/21/2019   TRIG 326.0 (H) 03/21/2019   HDL 50.90 03/21/2019   LDLDIRECT 113.0 03/21/2019   LDLCALC 119 (H) 03/13/2018   ALT 44 03/21/2019   AST 32 03/21/2019   NA 140 03/21/2019   K 3.7 03/21/2019   CL 103 03/21/2019   CREATININE 0.92 03/21/2019   BUN 14 03/21/2019   CO2 29 03/21/2019   TSH 2.05 03/21/2019   PSA 1.49 02/21/2019   HGBA1C 5.6 03/21/2019    Mr Prostate W Wo Contrast  Result Date: 03/16/2019 CLINICAL DATA:   Elevated PSA, no biopsy EXAM: MR PROSTATE WITHOUT AND WITH CONTRAST TECHNIQUE: Multiplanar multisequence MRI images were obtained of the pelvis centered about the prostate. Pre and post contrast images were obtained. CONTRAST:  8 mL Gadovist IV COMPARISON:  None. FINDINGS: Limited evaluation due to patient motion and susceptibility artifact from the patient's right hip prosthesis. DynaCAD 3D software could not be used to create a prostate boundary. Prostate: Mild linear/geographic low T2 signal within the right mid peripheral zone (series 5/image 16), nonspecific,  possibly reflecting prostatitis. No focal masslike lesion suspicious for high-grade macroscopic prostate cancer. No restricted diffusion/ADC, although limited due to susceptibility artifact. No early arterial enhancement. Volume: 3.4 x 4.8 x 4.1 cm (volume = 35 mL) Transcapsular spread:  Absent. Seminal vesicle involvement: Absent. Neurovascular bundle involvement: Absent. Pelvic adenopathy: Absent. Bone metastasis: Absent. Right hip arthroplasty. Avascular necrosis of the left femoral head. Other findings: None. IMPRESSION: Limited evaluation, as described above. No findings suspicious for high-grade macroscopic prostate cancer. Nonspecific linear/geographic low T2 signal within the right mid peripheral zone, possibly reflecting prostatitis. This does not correspond to a focal lesion. Electronically Signed   By: Charline BillsSriyesh  Krishnan M.D.   On: 03/16/2019 13:24    Assessment & Plan:   [name] was seen today for annual exam and hypertension.  Diagnoses and all orders for this visit:  Essential hypertension, benign- His BP is well controlled. -     CBC with Differential/Platelet; Future -     Comprehensive metabolic panel; Future -     TSH; Future  Routine general medical examination at a health care facility- Exam completed, labs reviewed, statin therapy is not indicated, vaccines reviewed, Cologuard ordered to screen for colon cancer/polyps,  patient education material was given. -     Lipid panel; Future  Hyperglycemia- His blood sugar is normal now. -     Comprehensive metabolic panel; Future -     Hemoglobin A1c; Future  Colon cancer screening -     Cologuard  Hypertriglyceridemia- His triglycerides are up to 326.  I have asked him to significantly decrease his alcohol intake and to start taking a fish oil supplement. -     Icosapent Ethyl (VASCEPA) 1 g CAPS; Take 2 capsules (2 g total) by mouth 2 (two) times daily.   I have discontinued Tafari Wassmer's HYDROcodone-acetaminophen. I am also having him start on Vascepa. Additionally, I am having him maintain his Azelastine HCl, EPINEPHrine, diclofenac sodium, omalizumab, ibuprofen, aspirin, Bystolic, methocarbamol, Ventolin HFA, albuterol, budesonide-formoterol, montelukast, and Tiotropium Bromide Monohydrate. We will stop administering omalizumab and predniSONE.  Meds ordered this encounter  Medications  . Icosapent Ethyl (VASCEPA) 1 g CAPS    Sig: Take 2 capsules (2 g total) by mouth 2 (two) times daily.    Dispense:  360 capsule    Refill:  1     Follow-up: Return in about 6 months (around 09/20/2019).  Sanda Lingerhomas Magdalene Tardiff, MD

## 2019-03-21 NOTE — Patient Instructions (Signed)

## 2019-03-22 MED FILL — VASCEPA 1 GM CAPSULE: 1 | 90 days supply | Qty: 360 | Fill #0

## 2019-03-29 ENCOUNTER — Ambulatory Visit (INDEPENDENT_AMBULATORY_CARE_PROVIDER_SITE_OTHER): Payer: 59

## 2019-03-29 ENCOUNTER — Other Ambulatory Visit: Payer: Self-pay

## 2019-03-29 DIAGNOSIS — J4550 Severe persistent asthma, uncomplicated: Secondary | ICD-10-CM

## 2019-03-29 MED ORDER — OMALIZUMAB 150 MG ~~LOC~~ SOLR
375.0000 mg | SUBCUTANEOUS | Status: DC
  Administered 2019-03-29 – 2020-03-11 (×26): 375 mg via SUBCUTANEOUS

## 2019-03-30 ENCOUNTER — Ambulatory Visit: Payer: Self-pay

## 2019-04-03 ENCOUNTER — Telehealth: Payer: Self-pay

## 2019-04-03 ENCOUNTER — Telehealth: Payer: Self-pay | Admitting: *Deleted

## 2019-04-03 ENCOUNTER — Telehealth: Payer: Self-pay | Admitting: Orthopaedic Surgery

## 2019-04-03 NOTE — Telephone Encounter (Signed)
Noted. Thanks.

## 2019-04-03 NOTE — Telephone Encounter (Signed)
That will be fine. 

## 2019-04-03 NOTE — Telephone Encounter (Signed)
Ok

## 2019-04-03 NOTE — Telephone Encounter (Signed)
FMLA paperwork completed and placed on Dr. Verlin Fester desk to sign on Monday.

## 2019-04-03 NOTE — Telephone Encounter (Signed)
25-30 lbs

## 2019-04-03 NOTE — Telephone Encounter (Signed)
25-30 lbs. Unless he wants it to state up to 50 lbs.

## 2019-04-03 NOTE — Telephone Encounter (Signed)
Pt called in left vm requesting a note to provide to his employer which indicated his restrictions such as not doing any heavy lifting, heavy pulling, any other restrictions you think the pt shouldn't do.   [phone]

## 2019-04-03 NOTE — Telephone Encounter (Signed)
No heavy lifting note at work  But needs it to say no lifting, pushing, pulling more than ?? Pounds

## 2019-04-03 NOTE — Telephone Encounter (Signed)
What do you think a good number of pounds would be?

## 2019-04-04 ENCOUNTER — Ambulatory Visit: Payer: 59 | Admitting: Orthopaedic Surgery

## 2019-04-04 ENCOUNTER — Telehealth: Payer: Self-pay | Admitting: Orthopaedic Surgery

## 2019-04-04 MED FILL — levoFLOXacin 750 MG TABS: 750 | 1 days supply | Qty: 1 | Fill #0

## 2019-04-04 NOTE — Telephone Encounter (Signed)
Faxed to provided number  

## 2019-04-04 NOTE — Telephone Encounter (Signed)
Patient called to state instead of calling him when letter is ready you can fax to Employer @ [phone].

## 2019-04-04 NOTE — Telephone Encounter (Signed)
Note written

## 2019-04-06 DIAGNOSIS — Z1212 Encounter for screening for malignant neoplasm of rectum: Secondary | ICD-10-CM | POA: Diagnosis not present

## 2019-04-06 DIAGNOSIS — Z1211 Encounter for screening for malignant neoplasm of colon: Secondary | ICD-10-CM | POA: Diagnosis not present

## 2019-04-10 ENCOUNTER — Ambulatory Visit (INDEPENDENT_AMBULATORY_CARE_PROVIDER_SITE_OTHER): Payer: 59 | Admitting: *Deleted

## 2019-04-10 DIAGNOSIS — J4550 Severe persistent asthma, uncomplicated: Secondary | ICD-10-CM

## 2019-04-12 ENCOUNTER — Ambulatory Visit: Payer: Self-pay

## 2019-04-12 MED FILL — METHOCARBAMOL 500 MG TABS: 500 | 10 days supply | Qty: 40 | Fill #1

## 2019-04-13 LAB — COLOGUARD: Cologuard: NEGATIVE

## 2019-04-16 ENCOUNTER — Encounter: Payer: Self-pay | Admitting: Internal Medicine

## 2019-04-16 MED FILL — XOLAIR 150 MG SOLR: 150 | 28 days supply | Qty: 6 | Fill #7

## 2019-04-24 ENCOUNTER — Ambulatory Visit (INDEPENDENT_AMBULATORY_CARE_PROVIDER_SITE_OTHER): Payer: 59

## 2019-04-24 ENCOUNTER — Ambulatory Visit (INDEPENDENT_AMBULATORY_CARE_PROVIDER_SITE_OTHER): Payer: 59 | Admitting: Orthopaedic Surgery

## 2019-04-24 ENCOUNTER — Encounter: Payer: Self-pay | Admitting: Orthopaedic Surgery

## 2019-04-24 ENCOUNTER — Other Ambulatory Visit: Payer: Self-pay

## 2019-04-24 DIAGNOSIS — J4550 Severe persistent asthma, uncomplicated: Secondary | ICD-10-CM

## 2019-04-24 DIAGNOSIS — Z96641 Presence of right artificial hip joint: Secondary | ICD-10-CM | POA: Diagnosis not present

## 2019-04-24 DIAGNOSIS — M7061 Trochanteric bursitis, right hip: Secondary | ICD-10-CM | POA: Diagnosis not present

## 2019-04-24 DIAGNOSIS — M87052 Idiopathic aseptic necrosis of left femur: Secondary | ICD-10-CM

## 2019-04-24 DIAGNOSIS — J4551 Severe persistent asthma with (acute) exacerbation: Secondary | ICD-10-CM

## 2019-04-24 MED ORDER — OMALIZUMAB 150 MG ~~LOC~~ SOLR: 150.0000 mg | SUBCUTANEOUS | Status: DC

## 2019-04-24 MED ORDER — MELOXICAM 7.5 MG PO TABS: 7.5000 mg | ORAL_TABLET | Freq: Two times a day (BID) | ORAL | 3 refills | Status: DC | PRN

## 2019-04-24 MED ORDER — METHYLPREDNISOLONE 4 MG PO TABS: ORAL_TABLET | ORAL | 0 refills | Status: DC

## 2019-04-24 MED ORDER — METHYLPREDNISOLONE ACETATE 40 MG/ML IJ SUSP
40.0000 mg | INTRAMUSCULAR | Status: AC | PRN
  Administered 2019-04-24: 40 mg via INTRA_ARTICULAR

## 2019-04-24 MED ORDER — LIDOCAINE HCL 1 % IJ SOLN
3.0000 mL | INTRAMUSCULAR | Status: AC | PRN
  Administered 2019-04-24: 3 mL

## 2019-04-24 MED FILL — MELOXICAM 7.5 MG TABLET: 7.5 | 30 days supply | Qty: 60 | Fill #0

## 2019-04-24 MED FILL — METHYLPREDNISOLONE 4 MG TBP: 4 | 6 days supply | Qty: 21 | Fill #0

## 2019-04-24 NOTE — Progress Notes (Signed)
Office Visit Note   Patient: [name]           Date of Birth: 11/23/1965           MRN: [id number] Visit Date: 04/24/2019              Requested by: Janith Lima, MD [address] [place],  [place] [address] PCP: Janith Lima, MD   Assessment & Plan: Visit Diagnoses:  1. Status post total replacement of right hip   2. Avascular necrosis of bone of hip, left ([place])   3. Trochanteric bursitis, right hip     Plan: He has definitely developed a component of trochanteric bursitis on the right hip and I did recommend a steroid injection and explained the rationale behind this and he agreed.  He does have Voltaren gel at home and I want him to place this on this area at least twice daily.  He will avoid sleeping on the right side as well.  From a work standpoint, he can continue the 12-hour shifts but I want a limit his with lifting to no greater than 25 to 30 pounds for the next 6 months.  I would like to see him back in 6 months with a repeat AP pelvis and lateral of his right operative hip.  Follow-Up Instructions: Return in about 6 months (around 10/25/2019).   Orders:  Orders Placed This Encounter  Procedures  . Large Joint Inj  . XR HIPS BILAT W OR W/O PELVIS 3-4 VIEWS   No orders of the defined types were placed in this encounter.     Procedures: Large Joint Inj: R greater trochanter on 04/24/2019 9:39 AM Indications: pain and diagnostic evaluation Details: 22 G 1.5 in needle, lateral approach  Arthrogram: No  Medications: 3 mL lidocaine 1 %; 40 mg methylPREDNISolone acetate 40 MG/ML Outcome: tolerated well, no immediate complications Procedure, treatment alternatives, risks and benefits explained, specific risks discussed. Consent was given by the patient. Immediately prior to procedure a time out was called to verify the correct patient, procedure, equipment, support staff and site/side marked as required. Patient was prepped and draped in the usual  sterile fashion.       Clinical Data: No additional findings.   Subjective: Chief Complaint  Patient presents with  . Follow-up  The patient is now 5 months status post a right total hip arthroplasty.  This was done to treat the pain from avascular necrosis.  He has known avascular crisis of his left hip but is still asymptomatic from that side.  He has been dealing with significant right hip pain.  He is back to working 12-hour shifts.  We have had to keep him on lifting restrictions.  He denies any groin pain.  He is walking without assistive device and a minimal limp.  HPI  Review of Systems He currently denies any headache, chest pain, shortness of breath, fever, chills, nausea, vomiting  Objective: Vital Signs: There were no vitals taken for this visit.  Physical Exam He is alert and orient x3 and in no acute distress Ortho Exam Examination of his right hip shows that he moves fluidly with full internal and external rotation.  He does have pain to palpation of the trochanteric area and IT band.  His left hip moves fluidly and has no pain in the groin with extremes of rotation of the left hip.  His leg lengths are equal. Specialty Comments:  No specialty comments available.  Imaging: Xr Hips Bilat W Or W/o Pelvis 3-4 Views  Result Date: 04/24/2019 An AP pelvis and lateral both hips is reviewed today.  He has a right total hip arthroplasty with no complicating features or evidence of loosening.  There is still heterotopic bone just posterior to the greater trochanteric area on the right side.  His left side has evidence of avascular necrosis but there is no joint space narrowing and no femoral head collapse and this appears stable when compared to previous films.    PMFS History: Patient Active Problem List   Diagnosis Date Noted  . Hyperglycemia 03/21/2019  . Colon cancer screening 03/21/2019  . Hypertriglyceridemia 03/21/2019  . Avascular necrosis of bone of right hip  (HCC) 11/01/2018  . Avascular necrosis of bone of hip, left (HCC) 11/01/2018  . Avascular necrosis of hip, unspecified laterality (HCC) 10/24/2018  . Spinal stenosis at L4-L5 level 08/23/2018  . Essential hypertension, benign 05/24/2016  . DDD (degenerative disc disease), lumbar 02/04/2016  . Routine general medical examination at a health care facility 12/31/2015  . Carpal tunnel syndrome of right wrist 10/02/2011  . Alcohol abuse 01/20/2009  . Perennial and seasonal allergic rhinitis 01/20/2009  . Severe persistent asthma 01/02/2009  . Sarcoidosis (HCC) 11/10/2007   Past Medical History:  Diagnosis Date  . Asthma   . Avascular necrosis (HCC)   . Hypertension   . Sarcoidosis   . Sarcoidosis   . Sickle cell anemia (HCC)   . Sickle cell trait (HCC)     Family History  Problem Relation Age of Onset  . Asthma Mother   . Coronary artery disease Mother   . Stroke Mother   . Heart attack Mother   . Sickle cell trait Mother   . Prostate cancer Father   . Cancer Father [phone]       prostate  . Alcohol abuse Brother   . Early death Brother   . Drug abuse Brother   . Diabetes Neg Hx   . Heart disease Neg Hx   . Hyperlipidemia Neg Hx   . Hypertension Neg Hx   . Kidney disease Neg Hx     Past Surgical History:  Procedure Laterality Date  . I&D EXTREMITY  01/19/2012   Procedure: IRRIGATION AND DEBRIDEMENT EXTREMITY; right foot, Surgeon: Nadara MustardMarcus V Duda, MD;  Location: [facility];  Service: Orthopedics;  Laterality: Right;  . SHOULDER OPEN ROTATOR CUFF REPAIR  2007  . TOTAL HIP ARTHROPLASTY Right 11/10/2018   Procedure: RIGHT TOTAL HIP ARTHROPLASTY ANTERIOR APPROACH;  Surgeon: Kathryne HitchBlackman, Jaquel Glassburn Y, MD;  Location: [facility];  Service: Orthopedics;  Laterality: Right;   Social History   Occupational History  . Not on file  Tobacco Use  . Smoking status: Former Smoker    Packs/day: 0.50    Years: 10.00    Pack years: 5.00    Quit date: 10/04/1993    Years since quitting: 25.5  . Smokeless  tobacco: Never Used  Substance and Sexual Activity  . Alcohol use: Yes    Alcohol/week: 28.0 standard drinks    Types: 14 Cans of beer, 14 Shots of liquor per week  . Drug use: No  . Sexual activity: Yes    Partners: Female

## 2019-04-25 DIAGNOSIS — N402 Nodular prostate without lower urinary tract symptoms: Secondary | ICD-10-CM | POA: Diagnosis not present

## 2019-04-25 DIAGNOSIS — D075 Carcinoma in situ of prostate: Secondary | ICD-10-CM | POA: Diagnosis not present

## 2019-04-27 NOTE — Addendum Note (Signed)
Addended by: Carin Hock on: 04/27/2019 11:06 AM   Modules accepted: Orders

## 2019-04-30 DIAGNOSIS — N402 Nodular prostate without lower urinary tract symptoms: Secondary | ICD-10-CM | POA: Diagnosis not present

## 2019-05-07 ENCOUNTER — Telehealth: Payer: Self-pay | Admitting: Orthopaedic Surgery

## 2019-05-07 ENCOUNTER — Other Ambulatory Visit: Payer: Self-pay | Admitting: Physician Assistant

## 2019-05-07 ENCOUNTER — Other Ambulatory Visit: Payer: Self-pay

## 2019-05-07 ENCOUNTER — Ambulatory Visit (INDEPENDENT_AMBULATORY_CARE_PROVIDER_SITE_OTHER): Payer: 59 | Admitting: *Deleted

## 2019-05-07 DIAGNOSIS — J4550 Severe persistent asthma, uncomplicated: Secondary | ICD-10-CM

## 2019-05-07 MED ORDER — TRAMADOL HCL 50 MG PO TABS: 50.0000 mg | ORAL_TABLET | Freq: Four times a day (QID) | ORAL | 0 refills | Status: DC | PRN

## 2019-05-07 MED FILL — traMADol HCL 50 MG TABS: 50 | 7 days supply | Qty: 30 | Fill #0

## 2019-05-07 NOTE — Telephone Encounter (Signed)
Please advise 

## 2019-05-07 NOTE — Telephone Encounter (Signed)
Patient called and stated that he got injection in hip on 7/21 and took entire pack of prednisone. Patient stated still in pain and none of the meds are working. Wanting to know if you can call him something in for pain.  Please call patient to advise.  [phone]

## 2019-05-08 NOTE — Telephone Encounter (Signed)
Tramadol sent in yesterday Gwinda Passe was to call patient.

## 2019-05-08 NOTE — Telephone Encounter (Signed)
I called patient last night and advised.

## 2019-05-09 ENCOUNTER — Ambulatory Visit: Payer: Self-pay

## 2019-05-16 MED FILL — XOLAIR 150 MG SOLR: 150 | 28 days supply | Qty: 6 | Fill #8

## 2019-05-22 ENCOUNTER — Ambulatory Visit (INDEPENDENT_AMBULATORY_CARE_PROVIDER_SITE_OTHER): Payer: 59 | Admitting: *Deleted

## 2019-05-22 DIAGNOSIS — J4550 Severe persistent asthma, uncomplicated: Secondary | ICD-10-CM

## 2019-06-04 ENCOUNTER — Telehealth: Payer: Self-pay | Admitting: Orthopaedic Surgery

## 2019-06-04 ENCOUNTER — Ambulatory Visit (INDEPENDENT_AMBULATORY_CARE_PROVIDER_SITE_OTHER): Payer: 59

## 2019-06-04 ENCOUNTER — Other Ambulatory Visit: Payer: Self-pay

## 2019-06-04 DIAGNOSIS — J4550 Severe persistent asthma, uncomplicated: Secondary | ICD-10-CM

## 2019-06-04 NOTE — Telephone Encounter (Signed)
I actually know him well and see him in the hospital all the time at [facility].  It is okay to give him a note stating that he is cleared for any type of work without restrictions.

## 2019-06-04 NOTE — Telephone Encounter (Signed)
Patient aware note ready at front desk 

## 2019-06-04 NOTE — Telephone Encounter (Signed)
P called in said he is going for a new position at the [employer] and said he needs a note stating he has no restrictions at this time. Pt said he is doing fine and shouldn't have any.   [phone]

## 2019-06-04 NOTE — Telephone Encounter (Signed)
This ok? 

## 2019-06-05 ENCOUNTER — Ambulatory Visit: Payer: 59 | Admitting: Orthopaedic Surgery

## 2019-06-12 MED FILL — XOLAIR 150 MG SOLR: 150 | 28 days supply | Qty: 6 | Fill #9

## 2019-06-12 MED FILL — IBUPROFEN 800 MG TAB: 800 | 30 days supply | Qty: 90 | Fill #2

## 2019-06-18 ENCOUNTER — Ambulatory Visit: Payer: Self-pay

## 2019-06-19 ENCOUNTER — Other Ambulatory Visit: Payer: Self-pay

## 2019-06-19 ENCOUNTER — Ambulatory Visit (INDEPENDENT_AMBULATORY_CARE_PROVIDER_SITE_OTHER): Payer: 59 | Admitting: *Deleted

## 2019-06-19 DIAGNOSIS — J4550 Severe persistent asthma, uncomplicated: Secondary | ICD-10-CM | POA: Diagnosis not present

## 2019-06-22 MED FILL — DICLOFENAC SODIUM 1 % GEL: 1 | 16 days supply | Qty: 100 | Fill #2

## 2019-07-03 ENCOUNTER — Ambulatory Visit: Payer: Self-pay

## 2019-07-04 MED FILL — XOLAIR 150 MG SOLR: 150 | 28 days supply | Qty: 6 | Fill #10

## 2019-07-05 ENCOUNTER — Telehealth: Payer: Self-pay | Admitting: *Deleted

## 2019-07-05 ENCOUNTER — Ambulatory Visit (INDEPENDENT_AMBULATORY_CARE_PROVIDER_SITE_OTHER): Payer: 59 | Admitting: *Deleted

## 2019-07-05 ENCOUNTER — Other Ambulatory Visit: Payer: Self-pay

## 2019-07-05 DIAGNOSIS — J4550 Severe persistent asthma, uncomplicated: Secondary | ICD-10-CM | POA: Diagnosis not present

## 2019-07-05 NOTE — Telephone Encounter (Signed)
Error

## 2019-07-19 ENCOUNTER — Other Ambulatory Visit: Payer: Self-pay

## 2019-07-19 ENCOUNTER — Ambulatory Visit (INDEPENDENT_AMBULATORY_CARE_PROVIDER_SITE_OTHER): Payer: 59

## 2019-07-19 DIAGNOSIS — J4550 Severe persistent asthma, uncomplicated: Secondary | ICD-10-CM | POA: Diagnosis not present

## 2019-07-31 MED FILL — SYMBICORT 160-4.5 MCG INH: 160-4.5 | 30 days supply | Qty: 10 | Fill #1

## 2019-07-31 MED FILL — ALBUTEROL SULFATE HFA 108 (: 108 (90 BAS | 25 days supply | Qty: 18 | Fill #1

## 2019-07-31 MED FILL — XOLAIR 150 MG SOLR: 150 | 28 days supply | Qty: 6 | Fill #11

## 2019-08-02 ENCOUNTER — Ambulatory Visit (INDEPENDENT_AMBULATORY_CARE_PROVIDER_SITE_OTHER): Payer: 59 | Admitting: *Deleted

## 2019-08-02 ENCOUNTER — Other Ambulatory Visit: Payer: Self-pay

## 2019-08-02 DIAGNOSIS — J4550 Severe persistent asthma, uncomplicated: Secondary | ICD-10-CM

## 2019-08-03 MED FILL — DICLOFENAC SODIUM 1 % GEL: 1 | 16 days supply | Qty: 100 | Fill #3

## 2019-08-15 ENCOUNTER — Other Ambulatory Visit: Payer: Self-pay

## 2019-08-15 ENCOUNTER — Ambulatory Visit (INDEPENDENT_AMBULATORY_CARE_PROVIDER_SITE_OTHER): Payer: 59 | Admitting: *Deleted

## 2019-08-15 DIAGNOSIS — J4550 Severe persistent asthma, uncomplicated: Secondary | ICD-10-CM

## 2019-08-16 ENCOUNTER — Ambulatory Visit: Payer: Self-pay

## 2019-08-28 ENCOUNTER — Ambulatory Visit (INDEPENDENT_AMBULATORY_CARE_PROVIDER_SITE_OTHER): Payer: 59

## 2019-08-28 ENCOUNTER — Other Ambulatory Visit: Payer: Self-pay

## 2019-08-28 DIAGNOSIS — J4550 Severe persistent asthma, uncomplicated: Secondary | ICD-10-CM | POA: Diagnosis not present

## 2019-08-29 ENCOUNTER — Other Ambulatory Visit: Payer: Self-pay | Admitting: Pharmacist

## 2019-08-29 ENCOUNTER — Ambulatory Visit: Payer: Self-pay

## 2019-08-29 ENCOUNTER — Other Ambulatory Visit: Payer: Self-pay | Admitting: Allergy and Immunology

## 2019-08-29 MED ORDER — XOLAIR 150 MG ~~LOC~~ SOLR: 375.0000 mg | SUBCUTANEOUS | 11 refills | Status: DC

## 2019-09-07 MED FILL — XOLAIR 150 MG SOLR: 150 | 28 days supply | Qty: 6 | Fill #0

## 2019-09-11 ENCOUNTER — Ambulatory Visit (INDEPENDENT_AMBULATORY_CARE_PROVIDER_SITE_OTHER): Payer: 59

## 2019-09-11 ENCOUNTER — Other Ambulatory Visit: Payer: Self-pay

## 2019-09-11 DIAGNOSIS — J4550 Severe persistent asthma, uncomplicated: Secondary | ICD-10-CM | POA: Diagnosis not present

## 2019-09-12 ENCOUNTER — Ambulatory Visit: Payer: Self-pay

## 2019-09-24 ENCOUNTER — Telehealth: Payer: Self-pay | Admitting: Allergy and Immunology

## 2019-09-24 NOTE — Telephone Encounter (Signed)
Patient wants to know if he needs to get the Covid vaccine. He works in [employer].

## 2019-09-24 NOTE — Telephone Encounter (Signed)
That is a question for his employer. Thanks.

## 2019-09-24 NOTE — Telephone Encounter (Signed)
Will patient be considered essential to get vaccine because of his current conditions?

## 2019-09-24 NOTE — Telephone Encounter (Signed)
Patient notified

## 2019-09-26 ENCOUNTER — Ambulatory Visit (INDEPENDENT_AMBULATORY_CARE_PROVIDER_SITE_OTHER): Payer: 59 | Admitting: *Deleted

## 2019-09-26 ENCOUNTER — Other Ambulatory Visit: Payer: Self-pay

## 2019-09-26 DIAGNOSIS — J4550 Severe persistent asthma, uncomplicated: Secondary | ICD-10-CM | POA: Diagnosis not present

## 2019-10-01 MED FILL — XOLAIR 150 MG SOLR: 150 | 28 days supply | Qty: 6 | Fill #1

## 2019-10-04 DIAGNOSIS — Z760 Encounter for issue of repeat prescription: Secondary | ICD-10-CM | POA: Diagnosis not present

## 2019-10-11 ENCOUNTER — Other Ambulatory Visit: Payer: Self-pay

## 2019-10-11 ENCOUNTER — Ambulatory Visit (INDEPENDENT_AMBULATORY_CARE_PROVIDER_SITE_OTHER): Payer: 59 | Admitting: *Deleted

## 2019-10-11 DIAGNOSIS — J4550 Severe persistent asthma, uncomplicated: Secondary | ICD-10-CM | POA: Diagnosis not present

## 2019-10-12 ENCOUNTER — Ambulatory Visit: Payer: Self-pay

## 2019-10-24 ENCOUNTER — Ambulatory Visit (INDEPENDENT_AMBULATORY_CARE_PROVIDER_SITE_OTHER): Payer: 59

## 2019-10-24 ENCOUNTER — Ambulatory Visit: Payer: 59 | Admitting: Orthopaedic Surgery

## 2019-10-24 ENCOUNTER — Ambulatory Visit: Payer: Self-pay

## 2019-10-24 ENCOUNTER — Other Ambulatory Visit: Payer: Self-pay

## 2019-10-24 ENCOUNTER — Encounter: Payer: Self-pay | Admitting: Orthopaedic Surgery

## 2019-10-24 ENCOUNTER — Ambulatory Visit (INDEPENDENT_AMBULATORY_CARE_PROVIDER_SITE_OTHER): Payer: 59 | Admitting: Orthopaedic Surgery

## 2019-10-24 DIAGNOSIS — M7061 Trochanteric bursitis, right hip: Secondary | ICD-10-CM

## 2019-10-24 DIAGNOSIS — Z96641 Presence of right artificial hip joint: Secondary | ICD-10-CM | POA: Diagnosis not present

## 2019-10-24 DIAGNOSIS — J4550 Severe persistent asthma, uncomplicated: Secondary | ICD-10-CM | POA: Diagnosis not present

## 2019-10-24 MED ORDER — LIDOCAINE HCL 1 % IJ SOLN
3.0000 mL | INTRAMUSCULAR | Status: AC | PRN
  Administered 2019-10-24: 3 mL

## 2019-10-24 MED ORDER — DICLOFENAC SODIUM 1 % EX GEL: 2.0000 g | Freq: Four times a day (QID) | CUTANEOUS | 2 refills | Status: DC

## 2019-10-24 MED ORDER — METHYLPREDNISOLONE ACETATE 40 MG/ML IJ SUSP
40.0000 mg | INTRAMUSCULAR | Status: AC | PRN
  Administered 2019-10-24: 40 mg via INTRA_ARTICULAR

## 2019-10-24 MED FILL — DICLOFENAC SODIUM 1 % GEL: 1 | 18 days supply | Qty: 300 | Fill #0

## 2019-10-24 NOTE — Progress Notes (Signed)
Office Visit Note   Patient: [name]           Date of Birth: [birth year]           MRN: [id number] Visit Date: 10/24/2019              Requested by: Janith Lima, MD [address] [place],   [address] PCP: Janith Lima, MD   Assessment & Plan: Visit Diagnoses:  1. Status post total replacement of right hip   2. Trochanteric bursitis, right hip     Plan: I shared with him his x-rays and talked about heterotopic ossification.  It is not bothering him clinically in terms of hip stiffness at all.  Some of the heterotopic ossification along the trochanteric area may be causing his hip bursitis.  I did place a steroid injection around this which gave him great relief.  Follow-up will be as needed at this standpoint.  If he continues to have trochanteric symptoms and injections do not help we may have to explore removal of the sharp edge of heterotopic bone.  We would only do this if it becomes problematic.  All question concerns were answered and addressed.  Follow-up as otherwise as needed.  Follow-Up Instructions: Return if symptoms worsen or fail to improve.   Orders:  Orders Placed This Encounter  Procedures  . Large Joint Inj  . XR HIP UNILAT W OR W/O PELVIS 1V RIGHT   Meds ordered this encounter  Medications  . diclofenac Sodium (VOLTAREN) 1 % GEL    Sig: Apply 2-4 g topically 4 (four) times daily.    Dispense:  300 g    Refill:  2      Procedures: Large Joint Inj: R greater trochanter on 10/24/2019 3:33 PM Indications: pain and diagnostic evaluation Details: 22 G 1.5 in needle, lateral approach  Arthrogram: No  Medications: 3 mL lidocaine 1 %; 40 mg methylPREDNISolone acetate 40 MG/ML Outcome: tolerated well, no immediate complications Procedure, treatment alternatives, risks and benefits explained, specific risks discussed. Consent was given by the patient. Immediately prior to procedure a time out was called to verify the correct patient,  procedure, equipment, support staff and site/side marked as required. Patient was prepped and draped in the usual sterile fashion.       Clinical Data: No additional findings.   Subjective: Chief Complaint  Patient presents with  . Right Hip - Follow-up  [name] is now almost a year status post a right total hip arthroplasty.  He says his hip is been doing great but he does have pain on the lateral aspect of his right hip.  We have injected this area before due to trochanteric bursitis.  He denies any groin pain has no other issues.  He denies any left hip pain.  HPI  Review of Systems He currently denies any headache, chest pain, shortness of breath, fever, chills, nausea, vomiting  Objective: Vital Signs: There were no vitals taken for this visit.  Physical Exam He is alert and orient x3 and in no acute distress Ortho Exam Examination of his right operative hip shows that it moves smoothly and normally.  He has some pain over the trochanteric area.  His left hip exam is entirely normal. Specialty Comments:  No specialty comments available.  Imaging: XR HIP UNILAT W OR W/O PELVIS 1V RIGHT  Result Date: 10/24/2019 A low AP pelvis and lateral right hip shows a well-maintained total hip arthroplasty with no evidence of loosening.  There is significant heterotopic ossification to the superior lateral posterior aspect of the hip but not involving the joint itself.    PMFS History: Patient Active Problem List   Diagnosis Date Noted  . Hyperglycemia 03/21/2019  . Colon cancer screening 03/21/2019  . Hypertriglyceridemia 03/21/2019  . Avascular necrosis of bone of right hip (HCC) 11/01/2018  . Avascular necrosis of bone of hip, left (HCC) 11/01/2018  . Avascular necrosis of hip, unspecified laterality (HCC) 10/24/2018  . Spinal stenosis at L4-L5 level 08/23/2018  . Essential hypertension, benign 05/24/2016  . DDD (degenerative disc disease), lumbar 02/04/2016  . Routine  general medical examination at a health care facility 12/31/2015  . Carpal tunnel syndrome of right wrist 10/02/2011  . Alcohol abuse 01/20/2009  . Perennial and seasonal allergic rhinitis 01/20/2009  . Severe persistent asthma 01/02/2009  . Sarcoidosis (HCC) 11/10/2007   Past Medical History:  Diagnosis Date  . Asthma   . Avascular necrosis (HCC)   . Hypertension   . Sarcoidosis   . Sarcoidosis   . Sickle cell anemia (HCC)   . Sickle cell trait (HCC)     Family History  Problem Relation Age of Onset  . Asthma Mother   . Coronary artery disease Mother   . Stroke Mother   . Heart attack Mother   . Sickle cell trait Mother   . Prostate cancer Father   . Cancer Father 73       prostate  . Alcohol abuse Brother   . Early death Brother   . Drug abuse Brother   . Diabetes Neg Hx   . Heart disease Neg Hx   . Hyperlipidemia Neg Hx   . Hypertension Neg Hx   . Kidney disease Neg Hx     Past Surgical History:  Procedure Laterality Date  . I & D EXTREMITY  01/19/2012   Procedure: IRRIGATION AND DEBRIDEMENT EXTREMITY; right foot, Surgeon: Nadara Mustard, MD;  Location: [facility] OR;  Service: Orthopedics;  Laterality: Right;  . SHOULDER OPEN ROTATOR CUFF REPAIR  2007  . TOTAL HIP ARTHROPLASTY Right 11/10/2018   Procedure: RIGHT TOTAL HIP ARTHROPLASTY ANTERIOR APPROACH;  Surgeon: Kathryne Hitch, MD;  Location: [facility];  Service: Orthopedics;  Laterality: Right;   Social History   Occupational History  . Not on file  Tobacco Use  . Smoking status: Former Smoker    Packs/day: 0.50    Years: 10.00    Pack years: 5.00    Quit date: 10/04/1993    Years since quitting: 26.0  . Smokeless tobacco: Never Used  Substance and Sexual Activity  . Alcohol use: Yes    Alcohol/week: 28.0 standard drinks    Types: 14 Cans of beer, 14 Shots of liquor per week  . Drug use: No  . Sexual activity: Yes    Partners: Female

## 2019-10-25 ENCOUNTER — Ambulatory Visit: Payer: Self-pay

## 2019-10-29 DIAGNOSIS — N402 Nodular prostate without lower urinary tract symptoms: Secondary | ICD-10-CM | POA: Diagnosis not present

## 2019-10-29 LAB — PSA: PSA: 1.58

## 2019-10-30 MED FILL — XOLAIR 150 MG SOLR: 150 | 28 days supply | Qty: 6 | Fill #2

## 2019-10-31 ENCOUNTER — Other Ambulatory Visit: Payer: Self-pay | Admitting: Internal Medicine

## 2019-10-31 DIAGNOSIS — G8929 Other chronic pain: Secondary | ICD-10-CM

## 2019-10-31 DIAGNOSIS — M48061 Spinal stenosis, lumbar region without neurogenic claudication: Secondary | ICD-10-CM

## 2019-10-31 MED FILL — IBUPROFEN 800 MG TAB: 800 | 30 days supply | Qty: 90 | Fill #0

## 2019-11-02 DIAGNOSIS — N402 Nodular prostate without lower urinary tract symptoms: Secondary | ICD-10-CM | POA: Diagnosis not present

## 2019-11-07 ENCOUNTER — Other Ambulatory Visit: Payer: Self-pay

## 2019-11-07 ENCOUNTER — Ambulatory Visit (INDEPENDENT_AMBULATORY_CARE_PROVIDER_SITE_OTHER): Payer: 59

## 2019-11-07 DIAGNOSIS — J4550 Severe persistent asthma, uncomplicated: Secondary | ICD-10-CM | POA: Diagnosis not present

## 2019-11-21 ENCOUNTER — Other Ambulatory Visit: Payer: Self-pay

## 2019-11-21 ENCOUNTER — Ambulatory Visit (INDEPENDENT_AMBULATORY_CARE_PROVIDER_SITE_OTHER): Payer: 59

## 2019-11-21 DIAGNOSIS — J4550 Severe persistent asthma, uncomplicated: Secondary | ICD-10-CM

## 2019-11-21 MED FILL — XOLAIR 150 MG SOLR: 150 | 28 days supply | Qty: 6 | Fill #3

## 2019-12-05 ENCOUNTER — Ambulatory Visit (INDEPENDENT_AMBULATORY_CARE_PROVIDER_SITE_OTHER): Payer: 59

## 2019-12-05 ENCOUNTER — Other Ambulatory Visit: Payer: Self-pay

## 2019-12-05 DIAGNOSIS — J4550 Severe persistent asthma, uncomplicated: Secondary | ICD-10-CM | POA: Diagnosis not present

## 2019-12-19 ENCOUNTER — Ambulatory Visit (INDEPENDENT_AMBULATORY_CARE_PROVIDER_SITE_OTHER): Payer: 59

## 2019-12-19 ENCOUNTER — Other Ambulatory Visit: Payer: Self-pay

## 2019-12-19 DIAGNOSIS — J4550 Severe persistent asthma, uncomplicated: Secondary | ICD-10-CM

## 2019-12-20 ENCOUNTER — Ambulatory Visit: Payer: Self-pay

## 2019-12-24 MED FILL — XOLAIR 150 MG SOLR: 150 | 28 days supply | Qty: 6 | Fill #4

## 2020-01-02 ENCOUNTER — Ambulatory Visit (INDEPENDENT_AMBULATORY_CARE_PROVIDER_SITE_OTHER): Payer: 59

## 2020-01-02 ENCOUNTER — Telehealth: Payer: Self-pay

## 2020-01-02 DIAGNOSIS — J4550 Severe persistent asthma, uncomplicated: Secondary | ICD-10-CM

## 2020-01-02 NOTE — Telephone Encounter (Signed)
error 

## 2020-01-03 ENCOUNTER — Ambulatory Visit: Payer: Self-pay

## 2020-01-09 DIAGNOSIS — H52221 Regular astigmatism, right eye: Secondary | ICD-10-CM | POA: Diagnosis not present

## 2020-01-09 DIAGNOSIS — H5203 Hypermetropia, bilateral: Secondary | ICD-10-CM | POA: Diagnosis not present

## 2020-01-09 DIAGNOSIS — H524 Presbyopia: Secondary | ICD-10-CM | POA: Diagnosis not present

## 2020-01-14 ENCOUNTER — Ambulatory Visit: Payer: 59 | Admitting: Physician Assistant

## 2020-01-15 ENCOUNTER — Ambulatory Visit (INDEPENDENT_AMBULATORY_CARE_PROVIDER_SITE_OTHER): Payer: 59

## 2020-01-15 ENCOUNTER — Other Ambulatory Visit: Payer: Self-pay

## 2020-01-15 DIAGNOSIS — J4550 Severe persistent asthma, uncomplicated: Secondary | ICD-10-CM

## 2020-01-16 ENCOUNTER — Telehealth: Payer: Self-pay | Admitting: Pharmacist

## 2020-01-16 NOTE — Telephone Encounter (Signed)
Called patient to schedule an appointment for the [employer] [facility]. I was unable to reach the patient so I left a HIPAA-compliant message requesting that the patient return my call.   

## 2020-01-17 ENCOUNTER — Ambulatory Visit: Payer: Self-pay

## 2020-01-21 MED FILL — XOLAIR 150 MG SOLR: 150 | 28 days supply | Qty: 6 | Fill #5

## 2020-01-22 ENCOUNTER — Other Ambulatory Visit: Payer: Self-pay

## 2020-01-22 ENCOUNTER — Ambulatory Visit (HOSPITAL_BASED_OUTPATIENT_CLINIC_OR_DEPARTMENT_OTHER): Payer: 59 | Admitting: Pharmacist

## 2020-01-22 DIAGNOSIS — Z79899 Other long term (current) drug therapy: Secondary | ICD-10-CM

## 2020-01-22 NOTE — Progress Notes (Signed)
   S: Patient presents for review of their specialty medication therapy.  Patient is currently taking Xolair for asthma. Patient is managed by Dr. Nunzio Cobbs for this.  Adherence: confirms  Efficacy: reports doing well overall  Dosing: SubQ 375 mg every 2 weeks   Dose adjustments: Renal: no dose adjustments  Hepatic: no dose adjustments  Toxicity: Severe hypersensitivity reaction or anaphylaxis: none Fever, arthralgia, and rash: None  Drug-drug interactions: none  Monitoring: CV effects: none Eosinophilia and vasculitis: none Fever/arthralgia/rash: none  Hypersensitivity/Anaphylaxis: none; endorses some minor itching at the injection site but denies s/sx of severe hypersensitivity or anaphylaxis Malignant neoplasms: none  Pulmonary function tests (for asthma): spirometry last resulted 03/06/19.  O:  Lab Results  Component Value Date   WBC 5.3 03/21/2019   HGB 16.9 03/21/2019   HCT 48.2 03/21/2019   MCV 81.3 03/21/2019   PLT 241.0 03/21/2019     Chemistry      Component Value Date/Time   NA 140 03/21/2019 1635   K 3.7 03/21/2019 1635   CL 103 03/21/2019 1635   CO2 29 03/21/2019 1635   BUN 14 03/21/2019 1635   CREATININE 0.92 03/21/2019 1635   CREATININE 0.98 04/18/2013 1034      Component Value Date/Time   CALCIUM 9.1 03/21/2019 1635   ALKPHOS 113 03/21/2019 1635   AST 32 03/21/2019 1635   ALT 44 03/21/2019 1635   BILITOT 0.5 03/21/2019 1635     A/P: 1. Medication review: patient currently on Xolair for asthma. Reviewed the medication with the patient, including the following: Xolair, omalizumab, is a novel IgE blocker.  It appears to reduce rates of hospitalizations, ER visits and unscheduled physician visits due asthma exacerbations when added to standard therapy.  Studies also show a reduction in steroid requirements and improvement in quality of life.  Patient educated on purpose, proper use and potential adverse effects of Xolair.  Following instruction  patient verbalized understanding. Patient should always have an EpiPen readily available in the event of anaphylaxis.   SubQ: For SubQ injection only; doses >150 mg should be divided over more than one injection site (eg, 225 mg or 300 mg administered as two injections, 375 mg administered as three injections); each injection site should be separated by ?1 inch. Do not inject into moles, scars, bruises, tender areas, or broken skin. Injections may take 5 to 10 seconds to administer (solution is slightly viscous). Administer only under direct medical supervision and observe patient for 2 hours after the first 3 injections and 30 minutes after subsequent injections Angela Nevin 2015) or in accordance with individual institution policies and procedures.   No recommendations for any changes at this time.   Butch Penny, PharmD, CPP Clinical Pharmacist [facility] [phone]

## 2020-01-29 ENCOUNTER — Ambulatory Visit: Payer: Self-pay

## 2020-01-30 ENCOUNTER — Ambulatory Visit (INDEPENDENT_AMBULATORY_CARE_PROVIDER_SITE_OTHER): Payer: 59

## 2020-01-30 ENCOUNTER — Other Ambulatory Visit: Payer: Self-pay

## 2020-01-30 DIAGNOSIS — J4550 Severe persistent asthma, uncomplicated: Secondary | ICD-10-CM | POA: Diagnosis not present

## 2020-02-13 ENCOUNTER — Other Ambulatory Visit: Payer: Self-pay

## 2020-02-13 ENCOUNTER — Ambulatory Visit (INDEPENDENT_AMBULATORY_CARE_PROVIDER_SITE_OTHER): Payer: 59

## 2020-02-13 DIAGNOSIS — J4550 Severe persistent asthma, uncomplicated: Secondary | ICD-10-CM

## 2020-02-27 ENCOUNTER — Other Ambulatory Visit: Payer: Self-pay

## 2020-02-27 ENCOUNTER — Ambulatory Visit (INDEPENDENT_AMBULATORY_CARE_PROVIDER_SITE_OTHER): Payer: 59

## 2020-02-27 DIAGNOSIS — J4550 Severe persistent asthma, uncomplicated: Secondary | ICD-10-CM

## 2020-02-28 ENCOUNTER — Encounter: Payer: 59 | Admitting: Internal Medicine

## 2020-03-05 ENCOUNTER — Other Ambulatory Visit: Payer: Self-pay | Admitting: Internal Medicine

## 2020-03-05 DIAGNOSIS — M48061 Spinal stenosis, lumbar region without neurogenic claudication: Secondary | ICD-10-CM

## 2020-03-05 DIAGNOSIS — G8929 Other chronic pain: Secondary | ICD-10-CM

## 2020-03-05 DIAGNOSIS — M25551 Pain in right hip: Secondary | ICD-10-CM

## 2020-03-05 MED FILL — IBUPROFEN 800 MG TAB: 800 | 30 days supply | Qty: 90 | Fill #0

## 2020-03-11 ENCOUNTER — Ambulatory Visit (INDEPENDENT_AMBULATORY_CARE_PROVIDER_SITE_OTHER): Payer: 59

## 2020-03-11 ENCOUNTER — Other Ambulatory Visit: Payer: Self-pay

## 2020-03-11 DIAGNOSIS — J4550 Severe persistent asthma, uncomplicated: Secondary | ICD-10-CM

## 2020-03-12 ENCOUNTER — Ambulatory Visit: Payer: Self-pay

## 2020-03-18 MED FILL — XOLAIR 150 MG SOLR: 150 | 28 days supply | Qty: 6 | Fill #7

## 2020-03-20 ENCOUNTER — Telehealth: Payer: Self-pay | Admitting: Allergy and Immunology

## 2020-03-20 NOTE — Telephone Encounter (Signed)
We have paperwork, filling it out then will fa it appropriate place once completed.

## 2020-03-20 NOTE — Telephone Encounter (Signed)
PT called needs FMLA forms sent to [facility] office since Dr Nunzio Cobbs is no longer seeing patient in [place]. Provided fax #

## 2020-03-24 ENCOUNTER — Other Ambulatory Visit: Payer: Self-pay

## 2020-03-24 ENCOUNTER — Encounter: Payer: Self-pay | Admitting: Internal Medicine

## 2020-03-24 ENCOUNTER — Ambulatory Visit (INDEPENDENT_AMBULATORY_CARE_PROVIDER_SITE_OTHER): Payer: 59 | Admitting: Internal Medicine

## 2020-03-24 VITALS — BP 138/98 | HR 128 | Temp 98.4°F | Resp 16 | Ht 71.0 in | Wt 155.0 lb

## 2020-03-24 DIAGNOSIS — R7989 Other specified abnormal findings of blood chemistry: Secondary | ICD-10-CM

## 2020-03-24 DIAGNOSIS — R079 Chest pain, unspecified: Secondary | ICD-10-CM | POA: Diagnosis not present

## 2020-03-24 DIAGNOSIS — E781 Pure hyperglyceridemia: Secondary | ICD-10-CM | POA: Diagnosis not present

## 2020-03-24 DIAGNOSIS — Z0000 Encounter for general adult medical examination without abnormal findings: Secondary | ICD-10-CM

## 2020-03-24 DIAGNOSIS — I10 Essential (primary) hypertension: Secondary | ICD-10-CM | POA: Diagnosis not present

## 2020-03-24 DIAGNOSIS — R0789 Other chest pain: Secondary | ICD-10-CM | POA: Diagnosis not present

## 2020-03-24 DIAGNOSIS — D869 Sarcoidosis, unspecified: Secondary | ICD-10-CM

## 2020-03-24 DIAGNOSIS — J4551 Severe persistent asthma with (acute) exacerbation: Secondary | ICD-10-CM

## 2020-03-24 DIAGNOSIS — R000 Tachycardia, unspecified: Secondary | ICD-10-CM | POA: Diagnosis not present

## 2020-03-24 LAB — CBC WITH DIFFERENTIAL/PLATELET
Basophils Absolute: 0.1 10*3/uL (ref 0.0–0.1)
Basophils Relative: 0.8 % (ref 0.0–3.0)
Eosinophils Absolute: 0.1 10*3/uL (ref 0.0–0.7)
Eosinophils Relative: 1.1 % (ref 0.0–5.0)
HCT: 47.1 % (ref 39.0–52.0)
Hemoglobin: 16.4 g/dL (ref 13.0–17.0)
Lymphocytes Relative: 25.6 % (ref 12.0–46.0)
Lymphs Abs: 1.7 10*3/uL (ref 0.7–4.0)
MCHC: 34.9 g/dL (ref 30.0–36.0)
MCV: 82.9 fl (ref 78.0–100.0)
Monocytes Absolute: 1 10*3/uL (ref 0.1–1.0)
Monocytes Relative: 14.3 % — ABNORMAL HIGH (ref 3.0–12.0)
Neutro Abs: 3.9 10*3/uL (ref 1.4–7.7)
Neutrophils Relative %: 58.2 % (ref 43.0–77.0)
Platelets: 263 10*3/uL (ref 150.0–400.0)
RBC: 5.68 Mil/uL (ref 4.22–5.81)
RDW: 14.1 % (ref 11.5–15.5)
WBC: 6.7 10*3/uL (ref 4.0–10.5)

## 2020-03-24 LAB — BASIC METABOLIC PANEL
BUN: 15 mg/dL (ref 6–23)
CO2: 29 mEq/L (ref 19–32)
Calcium: 9.6 mg/dL (ref 8.4–10.5)
Chloride: 103 mEq/L (ref 96–112)
Creatinine, Ser: 1.08 mg/dL (ref 0.40–1.50)
GFR: 86.91 mL/min (ref >60.00)
Glucose, Bld: 85 mg/dL (ref 70–99)
Potassium: 3.9 mEq/L (ref 3.5–5.1)
Sodium: 140 mEq/L (ref 135–145)

## 2020-03-24 LAB — TROPONIN I (HIGH SENSITIVITY): High Sens Troponin I: 11 ng/L (ref 2–17)

## 2020-03-24 LAB — HEPATIC FUNCTION PANEL
ALT: 24 U/L (ref 0–53)
AST: 24 U/L (ref 0–37)
Albumin: 4.7 g/dL (ref 3.5–5.2)
Alkaline Phosphatase: 52 U/L (ref 39–117)
Bilirubin, Direct: 0.1 mg/dL (ref 0.0–0.3)
Total Bilirubin: 0.5 mg/dL (ref 0.2–1.2)
Total Protein: 8 g/dL (ref 6.0–8.3)

## 2020-03-24 LAB — LIPID PANEL
Cholesterol: 210 mg/dL — ABNORMAL HIGH (ref 0–200)
HDL: 66.3 mg/dL (ref >39.00)
LDL Cholesterol: 104 mg/dL — ABNORMAL HIGH (ref 0–99)
NonHDL: 143.44
Total CHOL/HDL Ratio: 3
Triglycerides: 195 mg/dL — ABNORMAL HIGH (ref 0.0–149.0)
VLDL: 39 mg/dL (ref 0.0–40.0)

## 2020-03-24 LAB — TSH: TSH: 3.65 u[IU]/mL (ref 0.35–4.50)

## 2020-03-24 LAB — D-DIMER, QUANTITATIVE: D-Dimer, Quant: 0.59 mcg/mL FEU — ABNORMAL HIGH (ref <0.50)

## 2020-03-24 MED ORDER — MONTELUKAST SODIUM 10 MG PO TABS: 10.0000 mg | ORAL_TABLET | Freq: Every day | ORAL | 1 refills | Status: DC

## 2020-03-24 MED ORDER — LEVALBUTEROL TARTRATE 45 MCG/ACT IN AERO: 1.0000 | INHALATION_SPRAY | Freq: Three times a day (TID) | RESPIRATORY_TRACT | 3 refills | Status: DC | PRN

## 2020-03-24 MED ORDER — LEVALBUTEROL HCL 0.63 MG/3ML IN NEBU: 0.6300 mg | INHALATION_SOLUTION | Freq: Three times a day (TID) | RESPIRATORY_TRACT | 3 refills | Status: DC | PRN

## 2020-03-24 MED ORDER — NEBIVOLOL HCL 5 MG PO TABS: 5.0000 mg | ORAL_TABLET | Freq: Every day | ORAL | 1 refills | Status: DC

## 2020-03-24 MED ORDER — BUDESONIDE-FORMOTEROL FUMARATE 160-4.5 MCG/ACT IN AERO: INHALATION_SPRAY | RESPIRATORY_TRACT | 1 refills | Status: DC

## 2020-03-24 MED ORDER — SPIRIVA RESPIMAT 1.25 MCG/ACT IN AERS: 2.0000 | INHALATION_SPRAY | Freq: Every day | RESPIRATORY_TRACT | 1 refills | Status: DC

## 2020-03-24 MED FILL — LEVALBUTEROL TAR HFA 45MCG: 45 | 33 days supply | Qty: 15 | Fill #0

## 2020-03-24 MED FILL — LEVALBUTEROL HCL 0.63 MG/3M: 0.63 | 20 days supply | Qty: 180 | Fill #0

## 2020-03-24 MED FILL — MONTELUKAST SOD 10 MG TAB: 10 | 90 days supply | Qty: 90 | Fill #0

## 2020-03-24 MED FILL — SPIRIVA RESPIMAT 1.25 MCG I: 1.25 | 90 days supply | Qty: 12 | Fill #0

## 2020-03-24 NOTE — Patient Instructions (Signed)

## 2020-03-24 NOTE — Progress Notes (Addendum)
Subjective:  Patient ID: [name], male    DOB: [birth year]  Age: 54 y.o. MRN: [id number]  CC: Hypertension, Annual Exam, and Asthma  This visit occurred during the SARS-CoV-2 public health emergency.  Safety protocols were in place, including screening questions prior to the visit, additional usage of staff PPE, and extensive cleaning of exam room while observing appropriate contact time as indicated for disinfecting solutions.    HPI [name] presents for a CPX.  He reports that his asthma has been well controlled.  He tells me he has been getting Xolair injections every 2 weeks.  He says he has been using his inhalers but it looks like according to prescription refills he would have run out.  He has mild shortness of breath but denies cough, wheezing, or hemoptysis.  He has had sharp left-sided chest pain the last few days.  Outpatient Medications Prior to Visit  Medication Sig Dispense Refill  . Azelastine HCl 0.15 % SOLN Place 2 sprays into both nostrils 2 (two) times daily. (Patient taking differently: Place 2 sprays into both nostrils 2 (two) times daily as needed (congestion). ) 30 mL 5  . diclofenac Sodium (VOLTAREN) 1 % GEL Apply 2-4 g topically 4 (four) times daily. 300 g 2  . EPINEPHrine 0.3 mg/0.3 mL IJ SOAJ injection Inject 0.3 mg into the muscle as needed for anaphylaxis.   2  . ibuprofen (ADVIL) 800 MG tablet TAKE 1 TABLET BY MOUTH EVERY 8 HOURS AS NEEDED. 90 tablet 0  . omalizumab (XOLAIR) 150 MG injection Inject 375 mg into the skin every 14 (fourteen) days. 6 each 11  . albuterol (PROVENTIL) (2.5 MG/3ML) 0.083% nebulizer solution Take 3 mLs (2.5 mg total) by nebulization every 4 (four) hours as needed for wheezing or shortness of breath. 75 mL 1  . budesonide-formoterol (SYMBICORT) 160-4.5 MCG/ACT inhaler Take 2 puffs first thing in am and then another 2 puffs about 12 hours later. 1 Inhaler 5  . BYSTOLIC 5 MG tablet TAKE 1 TABLET BY MOUTH ONCE DAILY 90 tablet 1    . meloxicam (MOBIC) 7.5 MG tablet Take 1 tablet (7.5 mg total) by mouth 2 (two) times daily between meals as needed for pain. 60 tablet 3  . methocarbamol (ROBAXIN) 500 MG tablet Take 1 tablet (500 mg total) by mouth every 6 (six) hours as needed for muscle spasms. 40 tablet 1  . montelukast (SINGULAIR) 10 MG tablet Take 1 tablet (10 mg total) by mouth at bedtime. 30 tablet 5  . Tiotropium Bromide Monohydrate (SPIRIVA RESPIMAT) 1.25 MCG/ACT AERS Inhale 2 puffs into the lungs daily. 1 Inhaler 3  . VENTOLIN HFA 108 (90 Base) MCG/ACT inhaler Inhale 2 puffs into the lungs every 6 (six) hours as needed. 1 Inhaler 3  . Icosapent Ethyl (VASCEPA) 1 g CAPS Take 2 capsules (2 g total) by mouth 2 (two) times daily. 360 capsule 1  . methylPREDNISolone (MEDROL) 4 MG tablet Medrol dose pack. Take as instructed 21 tablet 0  . traMADol (ULTRAM) 50 MG tablet Take 1 tablet (50 mg total) by mouth every 6 (six) hours as needed. (Patient not taking: Reported on 03/24/2020) 30 tablet 0  . omalizumab Geoffry Paradise) injection 375 mg      No facility-administered medications prior to visit.    ROS Review of Systems  Constitutional: Negative.  Negative for appetite change, chills, diaphoresis, fatigue and fever.  HENT: Negative.  Negative for sore throat and trouble swallowing.   Eyes: Negative.   Respiratory: Positive  for shortness of breath. Negative for cough and wheezing.   Cardiovascular: Positive for chest pain. Negative for palpitations and leg swelling.       Sharp left side chest pain for several days  Gastrointestinal: Negative for abdominal pain, constipation, diarrhea, nausea and vomiting.  Endocrine: Negative.   Genitourinary: Negative.  Negative for difficulty urinating, dysuria and hematuria.  Musculoskeletal: Negative.  Negative for arthralgias, back pain, myalgias and neck pain.  Skin: Negative.  Negative for color change and pallor.  Neurological: Negative.  Negative for dizziness, weakness and  light-headedness.  Hematological: Negative for adenopathy. Does not bruise/bleed easily.  Psychiatric/Behavioral: Negative.     Objective:  BP (!) 138/98 (BP Location: Left Arm, Patient Position: Sitting, Cuff Size: Normal)   Pulse (!) 128   Temp 98.4 F (36.9 C) (Oral)   Resp 16   Ht 5\' 11"  (1.803 m)   Wt 155 lb (70.3 kg)   SpO2 98%   BMI 21.62 kg/m   BP Readings from Last 3 Encounters:  03/24/20 (!) 138/98  03/21/19 124/86  03/05/19 (!) 154/92    Wt Readings from Last 3 Encounters:  03/24/20 155 lb (70.3 kg)  03/21/19 168 lb (76.2 kg)  11/10/18 158 lb 3.2 oz (71.8 kg)    Physical Exam Vitals reviewed.  Constitutional:      Appearance: Normal appearance.  HENT:     Nose: Nose normal.     Mouth/Throat:     Mouth: Mucous membranes are moist.  Eyes:     General: No scleral icterus.    Conjunctiva/sclera: Conjunctivae normal.  Cardiovascular:     Rate and Rhythm: Regular rhythm. Tachycardia present.     Heart sounds: Normal heart sounds, S1 normal and S2 normal. No murmur heard.  No systolic murmur is present.  No diastolic murmur is present.  No friction rub.     Comments: EKG- Wide complex tachycardia, 123 bpm NS T wave changes No LVH Pulmonary:     Effort: Pulmonary effort is normal.     Breath sounds: No stridor. No wheezing, rhonchi or rales.  Chest:     Chest wall: No mass, deformity, swelling or tenderness.  Abdominal:     General: Abdomen is flat. Bowel sounds are normal. There is no distension.     Palpations: Abdomen is soft. There is no hepatomegaly, splenomegaly or mass.     Tenderness: There is no abdominal tenderness.  Genitourinary:    Comments: GU and rectal exams were deferred at his request.  He tells me that this was recently done by his urologist. Musculoskeletal:        General: Normal range of motion.     Cervical back: Neck supple.     Right lower leg: No edema.     Left lower leg: No edema.  Lymphadenopathy:     Cervical: No  cervical adenopathy.  Skin:    General: Skin is warm and dry.     Coloration: Skin is not pale.     Findings: No rash.  Neurological:     General: No focal deficit present.     Mental Status: He is alert and oriented to person, place, and time. Mental status is at baseline.  Psychiatric:        Mood and Affect: Mood normal.        Behavior: Behavior normal.        Thought Content: Thought content normal.     Lab Results  Component Value Date   WBC 6.7  03/24/2020   HGB 16.4 03/24/2020   HCT 47.1 03/24/2020   PLT 263.0 03/24/2020   GLUCOSE 85 03/24/2020   CHOL 210 (H) 03/24/2020   TRIG 195.0 (H) 03/24/2020   HDL 66.30 03/24/2020   LDLDIRECT 113.0 03/21/2019   LDLCALC 104 (H) 03/24/2020   ALT 24 03/24/2020   AST 24 03/24/2020   NA 140 03/24/2020   K 3.9 03/24/2020   CL 103 03/24/2020   CREATININE 1.08 03/24/2020   BUN 15 03/24/2020   CO2 29 03/24/2020   TSH 3.65 03/24/2020   PSA 1.58 10/29/2019   HGBA1C 5.6 03/21/2019    MR PROSTATE W WO CONTRAST  Result Date: 03/16/2019 CLINICAL DATA:  Elevated PSA, no biopsy EXAM: MR PROSTATE WITHOUT AND WITH CONTRAST TECHNIQUE: Multiplanar multisequence MRI images were obtained of the pelvis centered about the prostate. Pre and post contrast images were obtained. CONTRAST:  8 mL Gadovist IV COMPARISON:  None. FINDINGS: Limited evaluation due to patient motion and susceptibility artifact from the patient's right hip prosthesis. DynaCAD 3D software could not be used to create a prostate boundary. Prostate: Mild linear/geographic low T2 signal within the right mid peripheral zone (series 5/image 16), nonspecific, possibly reflecting prostatitis. No focal masslike lesion suspicious for high-grade macroscopic prostate cancer. No restricted diffusion/ADC, although limited due to susceptibility artifact. No early arterial enhancement. Volume: 3.4 x 4.8 x 4.1 cm (volume = 35 mL) Transcapsular spread:  Absent. Seminal vesicle involvement: Absent.  Neurovascular bundle involvement: Absent. Pelvic adenopathy: Absent. Bone metastasis: Absent. Right hip arthroplasty. Avascular necrosis of the left femoral head. Other findings: None. IMPRESSION: Limited evaluation, as described above. No findings suspicious for high-grade macroscopic prostate cancer. Nonspecific linear/geographic low T2 signal within the right mid peripheral zone, possibly reflecting prostatitis. This does not correspond to a focal lesion. Electronically Signed   By: Charline Bills M.D.   On: 03/16/2019 13:24    Assessment & Plan:   [name] was seen today for hypertension, annual exam and asthma.  Diagnoses and all orders for this visit:  Essential hypertension, benign- His blood pressure is not adequately well controlled.  I have asked him to restart nebivolol. -     CBC with Differential/Platelet; Future -     Basic metabolic panel; Future -     Hepatic function panel; Future -     Urinalysis, Routine w reflex microscopic; Future -     TSH; Future -     EKG 12-Lead -     nebivolol (BYSTOLIC) 5 MG tablet; Take 1 tablet (5 mg total) by mouth daily. -     TSH -     Basic metabolic panel -     CBC with Differential/Platelet -     Hepatic function panel -     Urinalysis, Routine w reflex microscopic  Severe persistent asthma with acute exacerbation- He appears well controlled though he has developed tachycardia.  I am concerned this may be related to albuterol so have asked him to switch to levalbuterol.  Will continue the combination of an ICS/LABA/LAMA.  I have also asked him to make sure that he is taking montelukast. -     levalbuterol (XOPENEX) 0.63 MG/3ML nebulizer solution; Take 3 mLs (0.63 mg total) by nebulization every 8 (eight) hours as needed for wheezing or shortness of breath. -     levalbuterol (XOPENEX HFA) 45 MCG/ACT inhaler; Inhale 1-2 puffs into the lungs every 8 (eight) hours as needed for wheezing. -     Tiotropium Bromide Monohydrate (  SPIRIVA  RESPIMAT) 1.25 MCG/ACT AERS; Inhale 2 puffs into the lungs daily. -     budesonide-formoterol (SYMBICORT) 160-4.5 MCG/ACT inhaler; Take 2 puffs first thing in am and then another 2 puffs about 12 hours later. -     montelukast (SINGULAIR) 10 MG tablet; Take 1 tablet (10 mg total) by mouth at bedtime.  Hypertriglyceridemia-his triglycerides are mildly elevated but do not require treatment.  Routine general medical examination at a health care facility-exam completed, labs reviewed, vaccines reviewed and updated, cancer screenings are up-to-date, patient education was given. -     Lipid panel; Future -     Cancel: PSA; Future -     Lipid panel  Sarcoidosis ([place])- He has developed tachycardia but otherwise the labs show no evidence of active sarcoid.  He will undergo a CT scan of the chest to see if there is any pulmonary involvement. -     Basic metabolic panel; Future -     Hepatic function panel; Future -     Basic metabolic panel -     Hepatic function panel  Tachycardia - He has a history of sarcoidosis so I have asked him to see cardiology to consider evaluation for cardiac involvement.  For now, will change the albuterol to levalbuterol and will nebivolol. -     EKG 12-Lead -     Ambulatory referral to Cardiology -     Urine drugs of abuse scrn w alc, routine (Ref Lab); Future -     D-dimer, quantitative (not at [facility]); Future -     Troponin I (High Sensitivity); Future -     Troponin I (High Sensitivity) -     D-dimer, quantitative (not at [facility]) -     Urine drugs of abuse scrn w alc, routine (Ref Lab) -     CT Angio Chest W/Cm &/Or Wo Cm; Future  D-dimer, elevated- He has CP and a slightly elevated d-dimer. Will get a CT angio to screen for PE. -     CT Angio Chest W/Cm &/Or Wo Cm; Future  Left-sided chest pain -     CT Angio Chest W/Cm &/Or Wo Cm; Future  Other chest pain -     CT Angio Chest W/Cm &/Or Wo Cm; Future   I have discontinued [name]'s methocarbamol,  Ventolin HFA, albuterol, Vascepa, methylPREDNISolone, meloxicam, and traMADol. I have also changed his Bystolic to nebivolol. Additionally, I am having him start on levalbuterol and levalbuterol. Lastly, I am having him maintain his Azelastine HCl, EPINEPHrine, Xolair, diclofenac Sodium, ibuprofen, Spiriva Respimat, budesonide-formoterol, and montelukast. We will stop administering omalizumab.  Meds ordered this encounter  Medications  . levalbuterol (XOPENEX) 0.63 MG/3ML nebulizer solution    Sig: Take 3 mLs (0.63 mg total) by nebulization every 8 (eight) hours as needed for wheezing or shortness of breath.    Dispense:  150 mL    Refill:  3  . levalbuterol (XOPENEX HFA) 45 MCG/ACT inhaler    Sig: Inhale 1-2 puffs into the lungs every 8 (eight) hours as needed for wheezing.    Dispense:  3 Inhaler    Refill:  3  . Tiotropium Bromide Monohydrate (SPIRIVA RESPIMAT) 1.25 MCG/ACT AERS    Sig: Inhale 2 puffs into the lungs daily.    Dispense:  12 g    Refill:  1  . budesonide-formoterol (SYMBICORT) 160-4.5 MCG/ACT inhaler    Sig: Take 2 puffs first thing in am and then another 2 puffs about 12 hours later.  Dispense:  3 Inhaler    Refill:  1  . montelukast (SINGULAIR) 10 MG tablet    Sig: Take 1 tablet (10 mg total) by mouth at bedtime.    Dispense:  90 tablet    Refill:  1  . nebivolol (BYSTOLIC) 5 MG tablet    Sig: Take 1 tablet (5 mg total) by mouth daily.    Dispense:  90 tablet    Refill:  1   In addition to time spent on CPE, I spent 60 minutes in preparing to see the patient by review of recent labs, imaging and procedures, obtaining and reviewing separately obtained history, communicating with the patient and family or caregiver, ordering medications, tests or procedures, and documenting clinical information in the EHR including the differential Dx, treatment, and any further evaluation and other management of 1. Essential hypertension, benign 2. Severe persistent asthma with  acute exacerbation 3. Hypertriglyceridemia 4. Sarcoidosis (HCC) 5. Tachycardia 6. D-dimer, elevated 7. Left-sided chest pain 8. Other chest pain    Follow-up: Return in about 4 weeks (around 04/21/2020).  Sanda Lingerhomas Mirtha Jain, MD

## 2020-03-25 ENCOUNTER — Ambulatory Visit: Payer: Self-pay

## 2020-03-25 DIAGNOSIS — R079 Chest pain, unspecified: Secondary | ICD-10-CM | POA: Insufficient documentation

## 2020-03-25 DIAGNOSIS — R7989 Other specified abnormal findings of blood chemistry: Secondary | ICD-10-CM | POA: Insufficient documentation

## 2020-03-25 LAB — URINE DRUGS OF ABUSE SCREEN W ALC, ROUTINE (REF LAB)
Amphetamines, Urine: NEGATIVE ng/mL
Barbiturate Quant, Ur: NEGATIVE ng/mL
Benzodiazepine Quant, Ur: NEGATIVE ng/mL
Cannabinoid Quant, Ur: NEGATIVE ng/mL
Cocaine (Metab.): NEGATIVE ng/mL
Ethanol, Urine: NEGATIVE %
Methadone Screen, Urine: NEGATIVE ng/mL
Opiate Quant, Ur: NEGATIVE ng/mL
PCP Quant, Ur: NEGATIVE ng/mL
Propoxyphene: NEGATIVE ng/mL

## 2020-03-25 LAB — URINALYSIS, ROUTINE W REFLEX MICROSCOPIC
Bilirubin Urine: NEGATIVE
Hgb urine dipstick: NEGATIVE
Ketones, ur: NEGATIVE
Leukocytes,Ua: NEGATIVE
Nitrite: NEGATIVE
RBC / HPF: NONE SEEN (ref 0–?)
Specific Gravity, Urine: 1.025 (ref 1.000–1.030)
Total Protein, Urine: NEGATIVE
Urine Glucose: NEGATIVE
Urobilinogen, UA: 0.2 (ref 0.0–1.0)
pH: 6 (ref 5.0–8.0)

## 2020-03-26 ENCOUNTER — Inpatient Hospital Stay: Admission: RE | Admit: 2020-03-26 | Payer: 59 | Source: Ambulatory Visit

## 2020-03-26 ENCOUNTER — Telehealth: Payer: Self-pay

## 2020-03-26 ENCOUNTER — Ambulatory Visit (INDEPENDENT_AMBULATORY_CARE_PROVIDER_SITE_OTHER): Payer: 59

## 2020-03-26 DIAGNOSIS — J4550 Severe persistent asthma, uncomplicated: Secondary | ICD-10-CM

## 2020-03-26 MED ORDER — OMALIZUMAB 150 MG ~~LOC~~ SOLR
150.0000 mg | SUBCUTANEOUS | Status: DC
  Administered 2020-03-26 – 2021-01-13 (×16): 150 mg via SUBCUTANEOUS

## 2020-03-26 NOTE — Telephone Encounter (Signed)
Pt rescheduled and is aware

## 2020-03-26 NOTE — Telephone Encounter (Signed)
Spoke to pt and informed of lab results and order for CT scan.   Pt would like to schedule the CT at [facility]. 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 Pt would like a call back when the CT is rescheduled.

## 2020-03-26 NOTE — Telephone Encounter (Signed)
New message   Returning call to the CMA from yesterday.   The patient is unaware of the CT appt scheduled for today.

## 2020-03-27 ENCOUNTER — Other Ambulatory Visit: Payer: Self-pay

## 2020-03-27 ENCOUNTER — Ambulatory Visit (HOSPITAL_COMMUNITY)
Admission: RE | Admit: 2020-03-27 | Discharge: 2020-03-27 | Disposition: A | Discharge status: Home / Self Care | Payer: 59 | Source: Ambulatory Visit | Attending: Internal Medicine | Admitting: Internal Medicine

## 2020-03-27 DIAGNOSIS — R000 Tachycardia, unspecified: Secondary | ICD-10-CM | POA: Insufficient documentation

## 2020-03-27 DIAGNOSIS — R0789 Other chest pain: Secondary | ICD-10-CM | POA: Insufficient documentation

## 2020-03-27 DIAGNOSIS — R7989 Other specified abnormal findings of blood chemistry: Secondary | ICD-10-CM | POA: Diagnosis not present

## 2020-03-27 DIAGNOSIS — R079 Chest pain, unspecified: Secondary | ICD-10-CM | POA: Insufficient documentation

## 2020-03-27 DIAGNOSIS — R0602 Shortness of breath: Secondary | ICD-10-CM | POA: Diagnosis not present

## 2020-03-27 MED ORDER — IOHEXOL 350 MG/ML SOLN
100.0000 mL | Freq: Once | INTRAVENOUS | Status: AC | PRN
  Administered 2020-03-27: 80 mL via INTRAVENOUS

## 2020-03-27 MED ORDER — SODIUM CHLORIDE (PF) 0.9 % IJ SOLN
INTRAMUSCULAR | Status: AC
  Filled 2020-03-27: qty 50

## 2020-03-27 MED FILL — BYSTOLIC 5 MG TABLET: 5 | 90 days supply | Qty: 90 | Fill #0

## 2020-03-28 ENCOUNTER — Telehealth: Payer: Self-pay

## 2020-03-28 NOTE — Telephone Encounter (Signed)
Spoke with pt. To let him know when Dr. Nunzio Cobbs returns to office on [place] we will have Dr. Nunzio Cobbs sign his FMLA paper's and will fax to [place]: [facility] specialist at [phone]. Told pt. Will do first thing Monday morning.

## 2020-03-31 NOTE — Telephone Encounter (Signed)
PT called to see if forms were faxed. Advised of Michele's notes.

## 2020-03-31 NOTE — Telephone Encounter (Signed)
Faxed fmla forms to [facility] at [phone] also mailed pt.a copy. Will let nicole know.

## 2020-04-02 ENCOUNTER — Encounter: Payer: Self-pay | Admitting: Cardiology

## 2020-04-02 NOTE — Progress Notes (Signed)
Cardiology Office Note   Date:  04/03/2020   ID:  [name], DOB 1966/03/02, MRN [id number]  PCP:  Ryan Grandchild, MD  Cardiologist:   No primary care provider on file. Referring:  Ryan Grandchild, MD  Chief Complaint  Patient presents with  . Tachycardia      History of Present Illness: [name] is a 54 y.o. male who was referred by Ryan Grandchild, MD for evaluation of tachycardia.  He had a rapid heart rate on 6/21 when he was seen.  I reviewed this EKG and it was SVT with a rate of 123.  He really does not notice this rhythm and would not have known that he was out of rhythm.  It looks like his resting heart rate might be in the 90s.  He does have significant asthma.  He has pulmonary sarcoidosis.  He has never been told he had any cardiac involvement of the sarcoid I do not see any previous cardiac work-up.  He works at the hospital.  He might take the stairs once in a while.  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  He gets some shortness of breath and wheezing with this but this has been baseline.  He might have diaphoresis when he short of breath.  He is not having any chest pressure, neck or arm discomfort.  As stated he does not really notice palpitations and has not had any presyncope or syncope.  He has had no weight gain or edema.  Of note there was a question of a pulmonary embolism and he was sent for a D-dimer which was minimally elevated.  Troponin was normal.  Electrolytes were unremarkable.  A CT of his chest demonstrated a chronic sarcoid but no pulmonary embolism.  I did review those images for this appointment.   Past Medical History:  Diagnosis Date  . Asthma   . Avascular necrosis (HCC)   . Hypertension   . Sarcoidosis   . Sickle cell anemia (HCC)     Past Surgical History:  Procedure Laterality Date  . I & D EXTREMITY  01/19/2012   Procedure: IRRIGATION AND DEBRIDEMENT EXTREMITY; right foot, Surgeon: Nadara Mustard, MD;  Location: [facility] OR;   Service: Orthopedics;  Laterality: Right;  . SHOULDER OPEN ROTATOR CUFF REPAIR  2007  . TOTAL HIP ARTHROPLASTY Right 11/10/2018   Procedure: RIGHT TOTAL HIP ARTHROPLASTY ANTERIOR APPROACH;  Surgeon: Kathryne Hitch, MD;  Location: [facility] ORS;  Service: Orthopedics;  Laterality: Right;     Current Outpatient Medications  Medication Sig Dispense Refill  . Azelastine HCl 0.15 % SOLN Place 2 sprays into both nostrils 2 (two) times daily. (Patient taking differently: Place 2 sprays into both nostrils 2 (two) times daily as needed (congestion). ) 30 mL 5  . budesonide-formoterol (SYMBICORT) 160-4.5 MCG/ACT inhaler Take 2 puffs first thing in am and then another 2 puffs about 12 hours later. 3 Inhaler 1  . diclofenac Sodium (VOLTAREN) 1 % GEL Apply 2-4 g topically 4 (four) times daily. 300 g 2  . EPINEPHrine 0.3 mg/0.3 mL IJ SOAJ injection Inject 0.3 mg into the muscle as needed for anaphylaxis.   2  . ibuprofen (ADVIL) 800 MG tablet TAKE 1 TABLET BY MOUTH EVERY 8 HOURS AS NEEDED. 90 tablet 0  . levalbuterol (XOPENEX HFA) 45 MCG/ACT inhaler Inhale 1-2 puffs into the lungs every 8 (eight) hours as needed for wheezing. 3 Inhaler 3  . levalbuterol (XOPENEX) 0.63 MG/3ML nebulizer  solution Take 3 mLs (0.63 mg total) by nebulization every 8 (eight) hours as needed for wheezing or shortness of breath. 150 mL 3  . montelukast (SINGULAIR) 10 MG tablet Take 1 tablet (10 mg total) by mouth at bedtime. 90 tablet 1  . nebivolol (BYSTOLIC) 5 MG tablet Take 1 tablet (5 mg total) by mouth daily. 90 tablet 1  . omalizumab (XOLAIR) 150 MG injection Inject 375 mg into the skin every 14 (fourteen) days. 6 each 11  . Tiotropium Bromide Monohydrate (SPIRIVA RESPIMAT) 1.25 MCG/ACT AERS Inhale 2 puffs into the lungs daily. 12 g 1   Current Facility-Administered Medications  Medication Dose Route Frequency Provider Last Rate Last Admin  . omalizumab Geoffry Paradise) injection 150 mg  150 mg Subcutaneous Q14 Days Marcelyn Bruins, MD   150 mg at 03/26/20 1534    Allergies:   Patient has no known allergies.    Social History:  The patient  reports that he quit smoking about 26 years ago. He has a 5.00 pack-year smoking history. He has never used smokeless tobacco. He reports current alcohol use of about 28.0 standard drinks of alcohol per week. He reports that he does not use drugs.   Family History:  The patient's family history includes Alcohol abuse in his brother; Asthma in his mother; Cancer (age of onset: 12) in his father; Coronary artery disease in his mother; Drug abuse in his brother; Early death in his brother; Heart attack in his mother; Prostate cancer in his father; Sickle cell trait in his mother; Stroke in his mother.    ROS:  Please see the history of present illness.   Otherwise, review of systems are positive for none.   All other systems are reviewed and negative.    PHYSICAL EXAM: VS:  BP (!) 130/102 (BP Location: Right Arm, Patient Position: Sitting, Cuff Size: Normal)   Pulse 96   Temp (!) 97.2 F (36.2 C)   Ht 5\' 11"  (1.803 m)   Wt 153 lb (69.4 kg)   BMI 21.34 kg/m  , BMI Body mass index is 21.34 kg/m. GENERAL:  Well appearing HEENT:  Pupils equal round and reactive, fundi not visualized, oral mucosa unremarkable NECK:  No jugular venous distention, waveform within normal limits, carotid upstroke brisk and symmetric, no bruits, no thyromegaly LYMPHATICS:  No cervical, inguinal adenopathy LUNGS:  Clear to auscultation bilaterally BACK:  No CVA tenderness CHEST:  Unremarkable HEART:  PMI not displaced or sustained,S1 and S2 within normal limits, questionable S3, no S4, no clicks, no rubs, no murmurs ABD:  Flat, positive bowel sounds normal in frequency in pitch, no bruits, no rebound, no guarding, no midline pulsatile mass, no hepatomegaly, no splenomegaly EXT:  2 plus pulses throughout, no edema, no cyanosis no clubbing SKIN:  No rashes no nodules NEURO:  Cranial nerves II  through XII grossly intact, motor grossly intact throughout PSYCH:  Cognitively intact, oriented to person place and time    EKG:  EKG is ordered today. The ekg ordered today demonstrates sinus rhythm, rate 97, axis leftward, intervals within normal limits, no acute ST-T wave changes.   Recent Labs: 03/24/2020: ALT 24; BUN 15; Creatinine, Ser 1.08; Hemoglobin 16.4; Platelets 263.0; Potassium 3.9; Sodium 140; TSH 3.65    Lipid Panel    Component Value Date/Time   CHOL 210 (H) 03/24/2020 1536   TRIG 195.0 (H) 03/24/2020 1536   HDL 66.30 03/24/2020 1536   CHOLHDL 3 03/24/2020 1536   VLDL 39.0 03/24/2020 1536  LDLCALC 104 (H) 03/24/2020 1536   LDLDIRECT 113.0 03/21/2019 1635      Wt Readings from Last 3 Encounters:  04/03/20 153 lb (69.4 kg)  03/24/20 155 lb (70.3 kg)  03/21/19 168 lb (76.2 kg)      Other studies Reviewed: Additional studies/ records that were reviewed today include: Office records and EKG.. Review of the above records demonstrates:  Please see elsewhere in the note.     ASSESSMENT AND PLAN:  TACHYCARDIA: The patient had SVT.  We talked about this.  He is going to get a 2-week monitor.  He is going to buy a Fitbit.  We talked about vagal maneuvers.  If he has significant burden I will likely start a calcium channel blocker avoiding beta-blockers because of his significant asthma.  SARCOID: I would like to check an echocardiogram.  At present I do not see clear evidence of cardiac involvement of the sarcoid.     Current medicines are reviewed at length with the patient today.  The patient has concerns regarding medicines.  The following changes have been made:  no change  Labs/ tests ordered today include:   Orders Placed This Encounter  Procedures  . LONG TERM MONITOR (3-14 DAYS)  . EKG 12-Lead  . ECHOCARDIOGRAM COMPLETE     Disposition:   FU with me after the monitor.      Signed, Rollene Rotunda, MD  04/03/2020 1:14 PM    [facility]

## 2020-04-03 ENCOUNTER — Other Ambulatory Visit: Payer: Self-pay

## 2020-04-03 ENCOUNTER — Ambulatory Visit (INDEPENDENT_AMBULATORY_CARE_PROVIDER_SITE_OTHER): Payer: 59 | Admitting: Cardiology

## 2020-04-03 ENCOUNTER — Encounter: Payer: Self-pay | Admitting: Cardiology

## 2020-04-03 ENCOUNTER — Encounter: Payer: Self-pay | Admitting: *Deleted

## 2020-04-03 VITALS — BP 130/102 | HR 96 | Temp 97.2°F | Ht 71.0 in | Wt 153.0 lb

## 2020-04-03 DIAGNOSIS — R000 Tachycardia, unspecified: Secondary | ICD-10-CM | POA: Diagnosis not present

## 2020-04-03 DIAGNOSIS — Z7189 Other specified counseling: Secondary | ICD-10-CM

## 2020-04-03 NOTE — Patient Instructions (Signed)
Medication Instructions:  CONTINUE WITH CURRENT MEDICATIONS. NO CHANGES.  *If you need a refill on your cardiac medications before your next appointment, please call your pharmacy*  Testing/Procedures: Your physician has requested that you have an echocardiogram. Echocardiography is a painless test that uses sound waves to create images of your heart. It provides your doctor with information about the size and shape of your heart and how well your heart's chambers and valves are working. This procedure takes approximately one hour. There are no restrictions for this procedure.  [address]  ZIO XT- Long Term Monitor Instructions   Your physician has requested you wear your ZIO patch monitor__14__days.   This is a single patch monitor.  [employer] supplies one patch monitor per enrollment.  Additional stickers are not available.   Please do not apply patch if you will be having a Nuclear Stress Test, Echocardiogram, Cardiac CT, MRI, or Chest Xray during the time frame you would be wearing the monitor. The patch cannot be worn during these tests.  You cannot remove and re-apply the ZIO XT patch monitor.   Your ZIO patch monitor will be sent USPS Priority mail from [facility] directly to your home address. The monitor may also be mailed to a PO BOX if home delivery is not available.   It may take 3-5 days to receive your monitor after you have been enrolled.   Once you have received you monitor, please review enclosed instructions.  Your monitor has already been registered assigning a specific monitor serial # to you.   Applying the monitor   Shave hair from upper left chest.   Hold abrader disc by orange tab.  Rub abrader in 40 strokes over left upper chest as indicated in your monitor instructions.   Clean area with 4 enclosed alcohol pads .  Use all pads to assure are is cleaned thoroughly.  Let dry.   Apply patch as indicated in monitor instructions.  Patch will be  place under collarbone on left side of chest with arrow pointing upward.   Rub patch adhesive wings for 2 minutes.Remove white label marked "1".  Remove white label marked "2".  Rub patch adhesive wings for 2 additional minutes.   While looking in a mirror, press and release button in center of patch.  A small green light will flash 3-4 times .  This will be your only indicator the monitor has been turned on.     Do not shower for the first 24 hours.  You may shower after the first 24 hours.   Press button if you feel a symptom. You will hear a small click.  Record Date, Time and Symptom in the Patient Log Book.   When you are ready to remove patch, follow instructions on last 2 pages of Patient Log Book.  Stick patch monitor onto last page of Patient Log Book.   Place Patient Log Book in [place] box.  Use locking tab on box and tape box closed securely.  The [employer] and [employer] has [employer] on it.  Please place in mailbox as soon as possible.  Your physician should have your test results approximately 7 days after the monitor has been mailed back to [facility].   Call [facility] Customer Care at [phone] if you have questions regarding your ZIO XT patch monitor.  Call them immediately if you see an orange light blinking on your monitor.   If your monitor falls off in less than 4  days contact our Monitor department at [phone].  If your monitor becomes loose or falls off after 4 days call [employer] at [phone] for suggestions on securing your monitor.     Follow-Up: 1 month after your heart monitor [facility], you and your health needs are our priority.  As part of our continuing mission to provide you with exceptional heart care, we have created designated Provider Care Teams.  These Care Teams include your primary Cardiologist (physician) and Advanced Practice Providers (APPs -  Physician Assistants and Nurse Practitioners) who all work together to provide you  with the care you need, when you need it.  We recommend signing up for the patient portal called "MyChart".  Sign up information is provided on this After Visit Summary.  MyChart is used to connect with patients for Virtual Visits (Telemedicine).  Patients are able to view lab/test results, encounter notes, upcoming appointments, etc.  Non-urgent messages can be sent to your provider as well.   To learn more about what you can do with MyChart, go to [email or web address].    Your next appointment:   6-8 weeks  The format for your next appointment:   In Person  Provider:   Rollene Rotunda, MD

## 2020-04-03 NOTE — Progress Notes (Signed)
Patient ID: [name], male   DOB: 16-Jul-1966, 54 y.o.   MRN: [id number] Patient enrolled for [employer] to ship a 14 day ZIO XT long term holter monitor to his home.

## 2020-04-08 ENCOUNTER — Ambulatory Visit (INDEPENDENT_AMBULATORY_CARE_PROVIDER_SITE_OTHER): Payer: 59

## 2020-04-08 DIAGNOSIS — R000 Tachycardia, unspecified: Secondary | ICD-10-CM

## 2020-04-08 DIAGNOSIS — Z7189 Other specified counseling: Secondary | ICD-10-CM

## 2020-04-08 NOTE — Telephone Encounter (Signed)
Faxed papers back

## 2020-04-08 NOTE — Telephone Encounter (Signed)
Will complete them correctly then refax threm

## 2020-04-08 NOTE — Telephone Encounter (Signed)
PT says the FMLA forms were not filled out correctly so he refaxed forms to [place]. Left at nurse station to correct and fax back to [employer].

## 2020-04-09 ENCOUNTER — Ambulatory Visit (INDEPENDENT_AMBULATORY_CARE_PROVIDER_SITE_OTHER): Payer: 59

## 2020-04-09 ENCOUNTER — Other Ambulatory Visit: Payer: Self-pay

## 2020-04-09 DIAGNOSIS — J4550 Severe persistent asthma, uncomplicated: Secondary | ICD-10-CM | POA: Diagnosis not present

## 2020-04-10 ENCOUNTER — Ambulatory Visit: Payer: Self-pay

## 2020-04-14 ENCOUNTER — Ambulatory Visit (HOSPITAL_COMMUNITY): Payer: 59 | Attending: Cardiology

## 2020-04-14 ENCOUNTER — Other Ambulatory Visit: Payer: Self-pay

## 2020-04-14 DIAGNOSIS — R000 Tachycardia, unspecified: Secondary | ICD-10-CM | POA: Insufficient documentation

## 2020-04-14 DIAGNOSIS — Z7189 Other specified counseling: Secondary | ICD-10-CM | POA: Diagnosis not present

## 2020-04-14 MED FILL — XOLAIR 150 MG SOLR: 150 | 28 days supply | Qty: 6 | Fill #8

## 2020-04-17 MED FILL — PENICILLIN VK 500 MG TABLET: 500 | 7 days supply | Qty: 28 | Fill #0

## 2020-04-17 MED FILL — HYDROCODON-APAP 5-325: 5-325 | 3 days supply | Qty: 14 | Fill #0

## 2020-04-23 ENCOUNTER — Ambulatory Visit (INDEPENDENT_AMBULATORY_CARE_PROVIDER_SITE_OTHER): Payer: 59

## 2020-04-23 ENCOUNTER — Other Ambulatory Visit: Payer: Self-pay

## 2020-04-23 DIAGNOSIS — J4550 Severe persistent asthma, uncomplicated: Secondary | ICD-10-CM

## 2020-04-24 ENCOUNTER — Other Ambulatory Visit (HOSPITAL_COMMUNITY): Payer: 59

## 2020-04-25 DIAGNOSIS — N402 Nodular prostate without lower urinary tract symptoms: Secondary | ICD-10-CM | POA: Diagnosis not present

## 2020-05-06 DIAGNOSIS — R000 Tachycardia, unspecified: Secondary | ICD-10-CM | POA: Diagnosis not present

## 2020-05-07 ENCOUNTER — Telehealth: Payer: Self-pay | Admitting: Cardiology

## 2020-05-07 ENCOUNTER — Other Ambulatory Visit: Payer: Self-pay

## 2020-05-07 ENCOUNTER — Ambulatory Visit (INDEPENDENT_AMBULATORY_CARE_PROVIDER_SITE_OTHER): Payer: 59 | Admitting: *Deleted

## 2020-05-07 DIAGNOSIS — J4550 Severe persistent asthma, uncomplicated: Secondary | ICD-10-CM

## 2020-05-07 NOTE — Telephone Encounter (Signed)
    Pt c/o of Chest Pain: STAT if CP now or developed within 24 hours  1. Are you having CP right now? Yes  2. Are you experiencing any other symptoms (ex. SOB, nausea, vomiting, sweating)? Yes, been using inhaler  3. How long have you been experiencing CP? For 1 week  4. Is your CP continuous or coming and going? Coming and Going  5. Have you taken Nitroglycerin? No  Pt said he is having CP on his right side of his chest. Right now it causing sharp pain that he have to go home early from work. He said he called pcp and was advised to call cards. ?

## 2020-05-07 NOTE — Telephone Encounter (Signed)
Received a call from patient he stated he started having right sided chest pain last night.Stated he went to work today and had intermittent chest pain all day.He called PCP and was advised to call Dr.Hochrein.Stated he is presently having right sided chest pain.Rates pain # 7.Advised to go to ED.I will make Dr.Hochrein aware.

## 2020-05-08 NOTE — Telephone Encounter (Signed)
I don't see that he went to the ED.  He should schedule follow up or go to urgent care/ED if he is having active chest pain.

## 2020-05-09 DIAGNOSIS — R972 Elevated prostate specific antigen [PSA]: Secondary | ICD-10-CM | POA: Diagnosis not present

## 2020-05-09 DIAGNOSIS — N402 Nodular prostate without lower urinary tract symptoms: Secondary | ICD-10-CM | POA: Diagnosis not present

## 2020-05-12 MED FILL — XOLAIR 150 MG SOLR: 150 | 28 days supply | Qty: 6 | Fill #9

## 2020-05-13 NOTE — Telephone Encounter (Signed)
Left message to call back  

## 2020-05-21 ENCOUNTER — Ambulatory Visit (INDEPENDENT_AMBULATORY_CARE_PROVIDER_SITE_OTHER): Payer: 59

## 2020-05-21 ENCOUNTER — Other Ambulatory Visit: Payer: Self-pay

## 2020-05-21 DIAGNOSIS — J4550 Severe persistent asthma, uncomplicated: Secondary | ICD-10-CM | POA: Diagnosis not present

## 2020-06-03 MED FILL — XOLAIR 150 MG SOLR: 150 | 28 days supply | Qty: 6 | Fill #10

## 2020-06-04 ENCOUNTER — Other Ambulatory Visit: Payer: Self-pay

## 2020-06-04 ENCOUNTER — Ambulatory Visit (INDEPENDENT_AMBULATORY_CARE_PROVIDER_SITE_OTHER): Payer: 59

## 2020-06-04 DIAGNOSIS — J4550 Severe persistent asthma, uncomplicated: Secondary | ICD-10-CM | POA: Diagnosis not present

## 2020-06-13 ENCOUNTER — Ambulatory Visit: Payer: 59 | Admitting: Cardiology

## 2020-06-18 ENCOUNTER — Ambulatory Visit (INDEPENDENT_AMBULATORY_CARE_PROVIDER_SITE_OTHER): Payer: 59 | Admitting: *Deleted

## 2020-06-18 ENCOUNTER — Other Ambulatory Visit: Payer: Self-pay

## 2020-06-18 DIAGNOSIS — J4550 Severe persistent asthma, uncomplicated: Secondary | ICD-10-CM

## 2020-06-27 MED FILL — XOLAIR 150 MG SOLR: 150 | 28 days supply | Qty: 6 | Fill #11

## 2020-06-30 ENCOUNTER — Other Ambulatory Visit: Payer: Self-pay | Admitting: Internal Medicine

## 2020-06-30 DIAGNOSIS — G8929 Other chronic pain: Secondary | ICD-10-CM

## 2020-06-30 DIAGNOSIS — M25551 Pain in right hip: Secondary | ICD-10-CM

## 2020-06-30 DIAGNOSIS — M48061 Spinal stenosis, lumbar region without neurogenic claudication: Secondary | ICD-10-CM

## 2020-07-01 MED FILL — IBUPROFEN 800 MG TAB: 800 | 30 days supply | Qty: 90 | Fill #0

## 2020-07-02 ENCOUNTER — Ambulatory Visit: Payer: Self-pay

## 2020-07-03 ENCOUNTER — Ambulatory Visit (INDEPENDENT_AMBULATORY_CARE_PROVIDER_SITE_OTHER): Payer: 59

## 2020-07-03 DIAGNOSIS — J4550 Severe persistent asthma, uncomplicated: Secondary | ICD-10-CM | POA: Diagnosis not present

## 2020-07-16 ENCOUNTER — Ambulatory Visit (INDEPENDENT_AMBULATORY_CARE_PROVIDER_SITE_OTHER): Payer: 59 | Admitting: *Deleted

## 2020-07-16 DIAGNOSIS — J4550 Severe persistent asthma, uncomplicated: Secondary | ICD-10-CM | POA: Diagnosis not present

## 2020-07-17 ENCOUNTER — Ambulatory Visit: Payer: Self-pay

## 2020-07-18 MED FILL — DICLOFENAC SODIUM 1 % GEL: 1 | 18 days supply | Qty: 300 | Fill #1

## 2020-07-21 ENCOUNTER — Other Ambulatory Visit: Payer: Self-pay | Admitting: Internal Medicine

## 2020-07-21 ENCOUNTER — Other Ambulatory Visit: Payer: Self-pay | Admitting: Allergy and Immunology

## 2020-07-21 NOTE — Telephone Encounter (Signed)
Requested medication (s) are due for refill today - unsure  Requested medication (s) are on the active medication list - yes  Future visit scheduled -no  Last refill: inpatient 07/16/20  Notes to clinic: Request for medication not assigned protocol- not sure if current patient  Requested Prescriptions  Pending Prescriptions Disp Refills   XOLAIR 150 MG injection [Pharmacy Med Name: XOLAIR 150 MG SOLR 150 Solution] 6 each 11    Sig: Inject 375 mg into the skin every 14 (fourteen) days.      Off-Protocol Failed - 07/21/2020  3:12 PM      Failed - Medication not assigned to a protocol, review manually.      Passed - Valid encounter within last 12 months    Recent Outpatient Visits           6 months ago Encounter for medication review   [facility] [place], Cornelius Moras, RPH-CPP                  Requested Prescriptions  Pending Prescriptions Disp Refills   XOLAIR 150 MG injection [Pharmacy Med Name: XOLAIR 150 MG SOLR 150 Solution] 6 each 11    Sig: Inject 375 mg into the skin every 14 (fourteen) days.      Off-Protocol Failed - 07/21/2020  3:12 PM      Failed - Medication not assigned to a protocol, review manually.      Passed - Valid encounter within last 12 months    Recent Outpatient Visits           6 months ago Encounter for medication review   [facility] [place], Cornelius Moras, RPH-CPP

## 2020-07-22 ENCOUNTER — Other Ambulatory Visit: Payer: Self-pay | Admitting: Pharmacist

## 2020-07-22 MED ORDER — XOLAIR 150 MG ~~LOC~~ SOLR: 375.0000 mg | SUBCUTANEOUS | 11 refills | Status: DC

## 2020-07-28 ENCOUNTER — Telehealth: Payer: Self-pay | Admitting: Allergy

## 2020-07-28 NOTE — Telephone Encounter (Signed)
Called [facility] and they advise need approval. Even though his approval runs till 09/03/20 he has used fills per [employer].  Will start new approval and L/M for patient advising same but he also needs MD appt since his last was June 2020.

## 2020-07-28 NOTE — Telephone Encounter (Signed)
Patient states that his pharmacy is going to fax over a paper on Xolair. Patient would like to know what is doing on with his medication because he really needs it.  Please advise.

## 2020-07-30 ENCOUNTER — Ambulatory Visit: Payer: Self-pay

## 2020-07-31 ENCOUNTER — Ambulatory Visit (INDEPENDENT_AMBULATORY_CARE_PROVIDER_SITE_OTHER): Payer: 59

## 2020-07-31 ENCOUNTER — Other Ambulatory Visit: Payer: Self-pay

## 2020-07-31 DIAGNOSIS — J4540 Moderate persistent asthma, uncomplicated: Secondary | ICD-10-CM

## 2020-08-01 MED FILL — IBUPROFEN 800 MG TAB: 800 | 30 days supply | Qty: 90 | Fill #1

## 2020-08-01 MED FILL — XOLAIR 150 MG SOLR: 150 | 28 days supply | Qty: 6 | Fill #0

## 2020-08-07 ENCOUNTER — Other Ambulatory Visit: Payer: Self-pay | Admitting: Allergy

## 2020-08-07 ENCOUNTER — Ambulatory Visit: Payer: 59 | Admitting: Allergy

## 2020-08-07 ENCOUNTER — Other Ambulatory Visit: Payer: Self-pay

## 2020-08-07 ENCOUNTER — Encounter: Payer: Self-pay | Admitting: Allergy

## 2020-08-07 VITALS — BP 122/76 | HR 92 | Temp 97.6°F | Resp 14 | Ht 68.0 in | Wt 158.0 lb

## 2020-08-07 DIAGNOSIS — J3089 Other allergic rhinitis: Secondary | ICD-10-CM

## 2020-08-07 DIAGNOSIS — J4550 Severe persistent asthma, uncomplicated: Secondary | ICD-10-CM

## 2020-08-07 MED ORDER — AZELASTINE HCL 0.15 % NA SOLN
1.0000 | Freq: Two times a day (BID) | NASAL | 5 refills | Status: DC | PRN
Start: 2020-08-07 — End: 2020-08-07

## 2020-08-07 MED ORDER — MONTELUKAST SODIUM 10 MG PO TABS
10.0000 mg | ORAL_TABLET | Freq: Every day | ORAL | 5 refills | Status: DC
Start: 2020-08-07 — End: 2020-08-07

## 2020-08-07 MED ORDER — FLUTICASONE PROPIONATE 50 MCG/ACT NA SUSP: 1.0000 | Freq: Two times a day (BID) | NASAL | 5 refills | Status: DC

## 2020-08-07 MED ORDER — SYMBICORT 160-4.5 MCG/ACT IN AERO: 2.0000 | INHALATION_SPRAY | Freq: Two times a day (BID) | RESPIRATORY_TRACT | 5 refills | Status: DC

## 2020-08-07 MED ORDER — LEVALBUTEROL TARTRATE 45 MCG/ACT IN AERO
1.0000 | INHALATION_SPRAY | Freq: Four times a day (QID) | RESPIRATORY_TRACT | 1 refills | Status: DC | PRN
Start: 2020-08-07 — End: 2020-08-07

## 2020-08-07 MED FILL — SYMBICORT 160-4.5 MCG INH: 160-4.5 | 30 days supply | Qty: 10 | Fill #0

## 2020-08-07 MED FILL — FLUTICASONE PROP 50 MCG SPR: 50 | 30 days supply | Qty: 16 | Fill #0

## 2020-08-07 MED FILL — LEVALBUTEROL TAR HFA 45MCG: 45 | 25 days supply | Qty: 15 | Fill #0

## 2020-08-07 MED FILL — MONTELUKAST SOD 10 MG TAB: 10 | 30 days supply | Qty: 30 | Fill #0

## 2020-08-07 MED FILL — AZELASTINE 0.15% NASAL SPRY: 0.15 | 25 days supply | Qty: 30 | Fill #0

## 2020-08-07 NOTE — Patient Instructions (Addendum)
Asthma: . Daily controller medication(s): continue Symbicort 2 puffs twice a day with spacer and rinse mouth afterwards. . Continue Xolair 375mg  every 2 weeks. Restart montelukast (singulair) 10mg  daily at night. . May use albuterol rescue inhaler 2 puffs every 4 to 6 hours as needed for shortness of breath, chest tightness, coughing, and wheezing. May use albuterol rescue inhaler 2 puffs 5 to 15 minutes prior to strenuous physical activities. Monitor frequency of use.  . Asthma control goals:  o Full participation in all desired activities (may need albuterol before activity) o Albuterol use two times or less a week on average (not counting use with activity) o Cough interfering with sleep two times or less a month o Oral steroids no more than once a year o No hospitalizations  Perennial and seasonal allergic rhinitis  May use over the counter antihistamines such as Zyrtec (cetirizine), Claritin (loratadine), Allegra (fexofenadine), or Xyzal (levocetirizine) daily as needed.  May use azelastine nasal spray 1-2 sprays per nostril twice a day as needed for runny nose/drainage.  May use Flonase (fluticasone) nasal spray 1 spray per nostril twice a day as needed for nasal congestion.   Follow up in 3 months or sooner if needed.

## 2020-08-07 NOTE — Progress Notes (Signed)
Follow Up Note  RE: [name] MRN: [id number] DOB: 23-Dec-1965 Date of Office Visit: 08/07/2020  Referring provider: Etta Grandchild, MD Primary care provider: Etta Grandchild, MD  Chief Complaint: Asthma (Refills and Xolair)  History of Present Illness: I had the pleasure of seeing [name] for a follow up visit at the [facility] on 08/07/2020. He is a 54 y.o. male, who is being followed for asthma on Xolair 375 mg every 2 weeks injections, allergic rhinitis. His previous allergy office visit was on 03/05/2019 with Dr. Nunzio Cobbs. Today is a regular follow up visit. Failed to follow up as recommended.  Severe persistent asthma Denies any ER/urgent care visits or prednisone use since the last visit.  Currently on Xolair 375mg  every 2 weeks for about 2 years with good benefit. No reactions to the medication.  Taking Symbicort 2 puffs twice a day. Patient wakes up 1-2 times per week at night due to inability to breathe and uses albuterol with good benefit.  He stopped using montelukast and Spiriva and this may have worsened his nocturnal breathing symptoms.  Perennial and seasonal allergic rhinitis Takes levocetirizine, azelastine and eye drops only during the spring months.  Assessment and Plan: [name] is a 54 y.o. male with: Severe persistent asthma Not well controlled as having nocturnal awakenings 1-2 times per week needing albuterol.  Patient is not sure if this has worsened since he stopped taking montelukast and Spiriva.  Today's spirometry showed some mixed restriction and obstruction. . Daily controller medication(s): continue Symbicort 44 2 puffs twice a day with spacer and rinse mouth afterwards. . Continue Xolair 375mg  every 2 weeks. Restart montelukast (singulair) 10mg  daily at night. . May use albuterol rescue inhaler 2 puffs every 4 to 6 hours as needed for shortness of breath, chest tightness, coughing, and wheezing. May use  albuterol rescue inhaler 2 puffs 5 to 15 minutes prior to strenuous physical activities. Monitor frequency of use.  . Repeat spirometry at next visit. . If no improvement will need to add Spiriva back to the regimen.  Perennial and seasonal allergic rhinitis Past history - 2019 skin testing was positive to grass, weed, ragweed, trees, mold, dust mites, cockroach, cat and dog.  Interim history - Only taking medications during the spring months now.  May use over the counter antihistamines such as Zyrtec (cetirizine), Claritin (loratadine), Allegra (fexofenadine), or Xyzal (levocetirizine) daily as needed.  May use azelastine nasal spray 1-2 sprays per nostril twice a day as needed for runny nose/drainage.  May use Flonase (fluticasone) nasal spray 1 spray per nostril twice a day as needed for nasal congestion.   Continue environmental control measures.  Return in about 3 months (around 11/07/2020).  Meds ordered this encounter  Medications  . SYMBICORT 160-4.5 MCG/ACT inhaler    Sig: Inhale 2 puffs into the lungs in the morning and at bedtime. with spacer and rinse mouth afterwards.    Dispense:  1 each    Refill:  5  . montelukast (SINGULAIR) 10 MG tablet    Sig: Take 1 tablet (10 mg total) by mouth at bedtime.    Dispense:  30 tablet    Refill:  5  . Azelastine HCl 0.15 % SOLN    Sig: Place 1-2 sprays into both nostrils 2 (two) times daily as needed (runny nose).    Dispense:  30 mL    Refill:  5  . fluticasone (FLONASE) 50 MCG/ACT nasal spray  Sig: Place 1 spray into both nostrils in the morning and at bedtime. For nasal congestion    Dispense:  16 g    Refill:  5  . levalbuterol (XOPENEX HFA) 45 MCG/ACT inhaler    Sig: Inhale 1-2 puffs into the lungs every 6 (six) hours as needed for wheezing or shortness of breath.    Dispense:  15 g    Refill:  1    Diagnostics: Spirometry:  Tracings reviewed. His effort: Good reproducible efforts. FVC: 3.07L FEV1: 2.01L, 65%  predicted FEV1/FVC ratio: 65% Interpretation: Spirometry consistent with mixed obstructive and restrictive disease.  Please see scanned spirometry results for details.  Medication List:  Current Outpatient Medications  Medication Sig Dispense Refill  . albuterol (VENTOLIN HFA) 108 (90 Base) MCG/ACT inhaler Inhale 2 puffs into the lungs every 6 (six) hours as needed for wheezing or shortness of breath.    . Azelastine HCl 0.15 % SOLN Place 1-2 sprays into both nostrils 2 (two) times daily as needed (runny nose). 30 mL 5  . diclofenac Sodium (VOLTAREN) 1 % GEL Apply 2-4 g topically 4 (four) times daily. 300 g 2  . EPINEPHrine 0.3 mg/0.3 mL IJ SOAJ injection Inject 0.3 mg into the muscle as needed for anaphylaxis.   2  . ibuprofen (ADVIL) 800 MG tablet TAKE 1 TABLET BY MOUTH EVERY 8 HOURS AS NEEDED 90 tablet 2  . levalbuterol (XOPENEX HFA) 45 MCG/ACT inhaler Inhale 1-2 puffs into the lungs every 6 (six) hours as needed for wheezing or shortness of breath. 15 g 1  . levalbuterol (XOPENEX) 0.63 MG/3ML nebulizer solution Take 3 mLs (0.63 mg total) by nebulization every 8 (eight) hours as needed for wheezing or shortness of breath. 150 mL 3  . montelukast (SINGULAIR) 10 MG tablet Take 1 tablet (10 mg total) by mouth at bedtime. 90 tablet 1  . nebivolol (BYSTOLIC) 5 MG tablet Take 1 tablet (5 mg total) by mouth daily. 90 tablet 1  . omalizumab (XOLAIR) 150 MG injection Inject 375 mg into the skin every 14 (fourteen) days. 6 each 11  . SYMBICORT 160-4.5 MCG/ACT inhaler Inhale 2 puffs into the lungs in the morning and at bedtime. with spacer and rinse mouth afterwards. 1 each 5  . Tiotropium Bromide Monohydrate (SPIRIVA RESPIMAT) 1.25 MCG/ACT AERS Inhale 2 puffs into the lungs daily. 12 g 1  . fluticasone (FLONASE) 50 MCG/ACT nasal spray Place 1 spray into both nostrils in the morning and at bedtime. For nasal congestion 16 g 5  . montelukast (SINGULAIR) 10 MG tablet Take 1 tablet (10 mg total) by mouth  at bedtime. 30 tablet 5   Current Facility-Administered Medications  Medication Dose Route Frequency Provider Last Rate Last Admin  . omalizumab Geoffry Paradise) injection 150 mg  150 mg Subcutaneous Q14 Days Marcelyn Bruins, MD   150 mg at 07/31/20 1342   Allergies: No Known Allergies I reviewed his past medical history, social history, family history, and environmental history and no significant changes have been reported from his previous visit.  Review of Systems  Constitutional: Negative for appetite change, chills, fever and unexpected weight change.  HENT: Negative for congestion and rhinorrhea.   Eyes: Negative for itching.  Respiratory: Negative for cough, chest tightness, shortness of breath and wheezing.   Gastrointestinal: Negative for abdominal pain.  Skin: Negative for rash.  Allergic/Immunologic: Positive for environmental allergies.  Neurological: Negative for headaches.   Objective: BP 122/76   Pulse 92   Temp 97.6 F (36.4  C)   Resp 14   Ht 5\' 8"  (1.727 m)   Wt 158 lb (71.7 kg)   SpO2 97%   BMI 24.02 kg/m  Body mass index is 24.02 kg/m. Physical Exam Vitals and nursing note reviewed.  Constitutional:      Appearance: Normal appearance. He is well-developed.  HENT:     Head: Normocephalic and atraumatic.     Right Ear: External ear normal.     Left Ear: External ear normal.     Nose: Nose normal.     Mouth/Throat:     Mouth: Mucous membranes are moist.     Pharynx: Oropharynx is clear.  Eyes:     Conjunctiva/sclera: Conjunctivae normal.  Cardiovascular:     Rate and Rhythm: Normal rate and regular rhythm.     Heart sounds: Normal heart sounds. No murmur heard.   Pulmonary:     Effort: Pulmonary effort is normal.     Breath sounds: Normal breath sounds. No wheezing, rhonchi or rales.  Musculoskeletal:     Cervical back: Neck supple.  Skin:    General: Skin is warm.     Findings: No rash.  Neurological:     Mental Status: He is alert and  oriented to person, place, and time.  Psychiatric:        Behavior: Behavior normal.    Previous notes and tests were reviewed. The plan was reviewed with the patient/family, and all questions/concerned were addressed.  It was my pleasure to see Oiva today and participate in his care. Please feel free to contact me with any questions or concerns.  Sincerely,  Delila Pereyra, DO Allergy & Immunology  [facility] office: [phone] [facility] office: [phone]

## 2020-08-07 NOTE — Assessment & Plan Note (Signed)
Past history - 2019 skin testing was positive to grass, weed, ragweed, trees, mold, dust mites, cockroach, cat and dog.  Interim history - Only taking medications during the spring months now.  May use over the counter antihistamines such as Zyrtec (cetirizine), Claritin (loratadine), Allegra (fexofenadine), or Xyzal (levocetirizine) daily as needed.  May use azelastine nasal spray 1-2 sprays per nostril twice a day as needed for runny nose/drainage.  May use Flonase (fluticasone) nasal spray 1 spray per nostril twice a day as needed for nasal congestion.   Continue environmental control measures.

## 2020-08-07 NOTE — Assessment & Plan Note (Signed)
Not well controlled as having nocturnal awakenings 1-2 times per week needing albuterol.  Patient is not sure if this has worsened since he stopped taking montelukast and Spiriva.  Today's spirometry showed some mixed restriction and obstruction. . Daily controller medication(s): continue Symbicort 2 puffs twice a day with spacer and rinse mouth afterwards. . Continue Xolair 375mg  every 2 weeks. Restart montelukast (singulair) 10mg  daily at night. . May use albuterol rescue inhaler 2 puffs every 4 to 6 hours as needed for shortness of breath, chest tightness, coughing, and wheezing. May use albuterol rescue inhaler 2 puffs 5 to 15 minutes prior to strenuous physical activities. Monitor frequency of use.  . Repeat spirometry at next visit. . If no improvement will need to add Spiriva back to the regimen.

## 2020-08-14 ENCOUNTER — Other Ambulatory Visit: Payer: Self-pay

## 2020-08-14 ENCOUNTER — Ambulatory Visit (INDEPENDENT_AMBULATORY_CARE_PROVIDER_SITE_OTHER): Payer: 59

## 2020-08-14 DIAGNOSIS — J4550 Severe persistent asthma, uncomplicated: Secondary | ICD-10-CM | POA: Diagnosis not present

## 2020-08-22 ENCOUNTER — Inpatient Hospital Stay: Payer: 59 | Attending: Oncology

## 2020-08-22 ENCOUNTER — Other Ambulatory Visit: Payer: Self-pay

## 2020-08-22 DIAGNOSIS — Z23 Encounter for immunization: Secondary | ICD-10-CM

## 2020-08-25 MED FILL — XOLAIR 150 MG SOLR: 150 | 28 days supply | Qty: 6 | Fill #1

## 2020-08-27 ENCOUNTER — Other Ambulatory Visit: Payer: Self-pay

## 2020-08-27 ENCOUNTER — Ambulatory Visit (INDEPENDENT_AMBULATORY_CARE_PROVIDER_SITE_OTHER): Payer: 59 | Admitting: *Deleted

## 2020-08-27 DIAGNOSIS — J4550 Severe persistent asthma, uncomplicated: Secondary | ICD-10-CM

## 2020-08-27 MED FILL — SYMBICORT 160-4.5 MCG INH: 160-4.5 | 30 days supply | Qty: 10 | Fill #0

## 2020-09-10 ENCOUNTER — Ambulatory Visit: Payer: Self-pay

## 2020-09-11 ENCOUNTER — Ambulatory Visit (INDEPENDENT_AMBULATORY_CARE_PROVIDER_SITE_OTHER): Payer: 59

## 2020-09-11 ENCOUNTER — Other Ambulatory Visit: Payer: Self-pay

## 2020-09-11 DIAGNOSIS — J4550 Severe persistent asthma, uncomplicated: Secondary | ICD-10-CM | POA: Diagnosis not present

## 2020-09-22 MED FILL — XOLAIR 150 MG SOLR: 150 | 28 days supply | Qty: 6 | Fill #2

## 2020-09-24 ENCOUNTER — Ambulatory Visit (INDEPENDENT_AMBULATORY_CARE_PROVIDER_SITE_OTHER): Payer: 59

## 2020-09-24 DIAGNOSIS — J4550 Severe persistent asthma, uncomplicated: Secondary | ICD-10-CM

## 2020-09-25 ENCOUNTER — Ambulatory Visit: Payer: Self-pay

## 2020-10-08 ENCOUNTER — Ambulatory Visit (INDEPENDENT_AMBULATORY_CARE_PROVIDER_SITE_OTHER): Payer: 59 | Admitting: *Deleted

## 2020-10-08 ENCOUNTER — Other Ambulatory Visit: Payer: Self-pay

## 2020-10-08 DIAGNOSIS — J4550 Severe persistent asthma, uncomplicated: Secondary | ICD-10-CM | POA: Diagnosis not present

## 2020-10-22 ENCOUNTER — Ambulatory Visit (INDEPENDENT_AMBULATORY_CARE_PROVIDER_SITE_OTHER): Payer: 59

## 2020-10-22 DIAGNOSIS — J4550 Severe persistent asthma, uncomplicated: Secondary | ICD-10-CM

## 2020-10-27 ENCOUNTER — Other Ambulatory Visit: Payer: Self-pay | Admitting: Orthopaedic Surgery

## 2020-10-27 MED FILL — DICLOFENAC SODIUM 1 % GEL: 1 | 19 days supply | Qty: 300 | Fill #0

## 2020-10-27 MED FILL — XOLAIR 150 MG SOLR: 150 | 28 days supply | Qty: 6 | Fill #3

## 2020-10-29 ENCOUNTER — Other Ambulatory Visit: Payer: Self-pay | Admitting: Urology

## 2020-10-29 DIAGNOSIS — N402 Nodular prostate without lower urinary tract symptoms: Secondary | ICD-10-CM

## 2020-10-29 DIAGNOSIS — R972 Elevated prostate specific antigen [PSA]: Secondary | ICD-10-CM

## 2020-10-30 MED FILL — IBUPROFEN 800 MG TAB: 800 | 30 days supply | Qty: 90 | Fill #2

## 2020-11-05 ENCOUNTER — Other Ambulatory Visit: Payer: Self-pay

## 2020-11-05 ENCOUNTER — Ambulatory Visit (INDEPENDENT_AMBULATORY_CARE_PROVIDER_SITE_OTHER): Payer: 59

## 2020-11-05 DIAGNOSIS — J4550 Severe persistent asthma, uncomplicated: Secondary | ICD-10-CM

## 2020-11-05 MED ORDER — OMALIZUMAB 150 MG ~~LOC~~ SOLR
375.0000 mg | SUBCUTANEOUS | Status: DC
  Administered 2020-11-05 – 2021-02-25 (×8): 375 mg via SUBCUTANEOUS

## 2020-11-17 ENCOUNTER — Other Ambulatory Visit (HOSPITAL_COMMUNITY): Payer: Self-pay | Admitting: Urology

## 2020-11-17 DIAGNOSIS — R972 Elevated prostate specific antigen [PSA]: Secondary | ICD-10-CM

## 2020-11-17 DIAGNOSIS — N402 Nodular prostate without lower urinary tract symptoms: Secondary | ICD-10-CM

## 2020-11-17 MED FILL — DIAZEPAM 10 MG TABS: 10 | 1 days supply | Qty: 1 | Fill #0

## 2020-11-18 NOTE — Progress Notes (Deleted)
Follow Up Note  RE: [name] MRN: [id number] DOB: 1966-04-24 Date of Office Visit: 11/19/2020  Referring provider: Etta Grandchild, MD Primary care provider: Etta Grandchild, MD  Chief Complaint: No chief complaint on file.  History of Present Illness: I had the pleasure of seeing Gentle Hoge for a follow up visit at the [facility] on 11/18/2020. He is a 55 y.o. male, who is being followed for asthma on Xolair 375 mg every 2 weeks and allergic rhinitis. His previous allergy office visit was on 08/07/2020 with Dr. Selena Batten. Today is a regular follow up visit.  Severe persistent asthma Not well controlled as having nocturnal awakenings 1-2 times per week needing albuterol.  Patient is not sure if this has worsened since he stopped taking montelukast and Spiriva.  Today's spirometry showed some mixed restriction and obstruction.  Daily controller medication(s):continue Symbicort 2 puffs twice a day with spacer and rinse mouth afterwards.  Continue Xolair 375mg  every 2 weeks.  Restart montelukast (singulair) 10mg  daily at night.  May use albuterol rescue inhaler 2 puffs every 4 to 6 hours as needed for shortness of breath, chest tightness, coughing, and wheezing. May use albuterol rescue inhaler 2 puffs 5 to 15 minutes prior to strenuous physical activities. Monitor frequency of use.   Repeat spirometry at next visit.  If no improvement will need to add Spiriva back to the regimen.  Perennial and seasonal allergic rhinitis Past history - 2019 skin testing was positive to grass, weed, ragweed, trees, mold, dust mites, cockroach, cat and dog.  Interim history - Only taking medications during the spring months now.  May use over the counter antihistamines such as Zyrtec (cetirizine), Claritin (loratadine), Allegra (fexofenadine), or Xyzal (levocetirizine) daily as needed.  May use azelastine nasal spray 1-2 sprays per nostril twice a day as needed for  runny nose/drainage.  May use Flonase (fluticasone) nasal spray 1 spray per nostril twice a day as needed for nasal congestion.   Continue environmental control measures.  Return in about 3 months (around 11/07/2020).  Assessment and Plan: Danell is a 55 y.o. male with: No problem-specific Assessment & Plan notes found for this encounter.  No follow-ups on file.  No orders of the defined types were placed in this encounter.  Lab Orders  No laboratory test(s) ordered today    Diagnostics: Spirometry:  Tracings reviewed. His effort: {Blank single:19197::"Good reproducible efforts.","It was hard to get consistent efforts and there is a question as to whether this reflects a maximal maneuver.","Poor effort, data can not be interpreted."} FVC: ***L FEV1: ***L, ***% predicted FEV1/FVC ratio: ***% Interpretation: {Blank single:19197::"Spirometry consistent with mild obstructive disease","Spirometry consistent with moderate obstructive disease","Spirometry consistent with severe obstructive disease","Spirometry consistent with possible restrictive disease","Spirometry consistent with mixed obstructive and restrictive disease","Spirometry uninterpretable due to technique","Spirometry consistent with normal pattern","No overt abnormalities noted given today's efforts"}.  Please see scanned spirometry results for details.  Skin Testing: {Blank single:19197::"Select foods","Environmental allergy panel","Environmental allergy panel and select foods","Food allergy panel","None","Deferred due to recent antihistamines use"}. Positive test to: ***. Negative test to: ***.  Results discussed with patient/family.   Medication List:  Current Outpatient Medications  Medication Sig Dispense Refill  . albuterol (VENTOLIN HFA) 108 (90 Base) MCG/ACT inhaler Inhale 2 puffs into the lungs every 6 (six) hours as needed for wheezing or shortness of breath.    . Azelastine HCl 0.15 % SOLN Place 1-2 sprays into  both nostrils 2 (two) times daily as needed (runny  nose). 30 mL 5  . diclofenac Sodium (VOLTAREN) 1 % GEL APPLY 2 TO 4 GRAMS TO AFFECTED AREA(S) FOUR TIMES DAILY 300 g 2  . EPINEPHrine 0.3 mg/0.3 mL IJ SOAJ injection Inject 0.3 mg into the muscle as needed for anaphylaxis.   2  . fluticasone (FLONASE) 50 MCG/ACT nasal spray Place 1 spray into both nostrils in the morning and at bedtime. For nasal congestion 16 g 5  . ibuprofen (ADVIL) 800 MG tablet TAKE 1 TABLET BY MOUTH EVERY 8 HOURS AS NEEDED 90 tablet 2  . levalbuterol (XOPENEX HFA) 45 MCG/ACT inhaler Inhale 1-2 puffs into the lungs every 6 (six) hours as needed for wheezing or shortness of breath. 15 g 1  . levalbuterol (XOPENEX) 0.63 MG/3ML nebulizer solution Take 3 mLs (0.63 mg total) by nebulization every 8 (eight) hours as needed for wheezing or shortness of breath. 150 mL 3  . montelukast (SINGULAIR) 10 MG tablet Take 1 tablet (10 mg total) by mouth at bedtime. 90 tablet 1  . montelukast (SINGULAIR) 10 MG tablet Take 1 tablet (10 mg total) by mouth at bedtime. 30 tablet 5  . nebivolol (BYSTOLIC) 5 MG tablet Take 1 tablet (5 mg total) by mouth daily. 90 tablet 1  . omalizumab (XOLAIR) 150 MG injection Inject 375 mg into the skin every 14 (fourteen) days. 6 each 11  . SYMBICORT 160-4.5 MCG/ACT inhaler Inhale 2 puffs into the lungs in the morning and at bedtime. with spacer and rinse mouth afterwards. 1 each 5  . Tiotropium Bromide Monohydrate (SPIRIVA RESPIMAT) 1.25 MCG/ACT AERS Inhale 2 puffs into the lungs daily. 12 g 1   Current Facility-Administered Medications  Medication Dose Route Frequency Provider Last Rate Last Admin  . omalizumab Geoffry Paradise) injection 150 mg  150 mg Subcutaneous Q14 Days Marcelyn Bruins, MD   150 mg at 10/22/20 1045  . omalizumab Geoffry Paradise) injection 375 mg  375 mg Subcutaneous Q14 Days Ellamae Sia, DO   375 mg at 11/05/20 1555   Allergies: No Known Allergies I reviewed his past medical history, social  history, family history, and environmental history and no significant changes have been reported from his previous visit.  Review of Systems  Constitutional: Negative for appetite change, chills, fever and unexpected weight change.  HENT: Negative for congestion and rhinorrhea.   Eyes: Negative for itching.  Respiratory: Negative for cough, chest tightness, shortness of breath and wheezing.   Gastrointestinal: Negative for abdominal pain.  Skin: Negative for rash.  Allergic/Immunologic: Positive for environmental allergies.  Neurological: Negative for headaches.   Objective: There were no vitals taken for this visit. There is no height or weight on file to calculate BMI. Physical Exam Vitals and nursing note reviewed.  Constitutional:      Appearance: Normal appearance. He is well-developed.  HENT:     Head: Normocephalic and atraumatic.     Right Ear: External ear normal.     Left Ear: External ear normal.     Nose: Nose normal.     Mouth/Throat:     Mouth: Mucous membranes are moist.     Pharynx: Oropharynx is clear.  Eyes:     Conjunctiva/sclera: Conjunctivae normal.  Cardiovascular:     Rate and Rhythm: Normal rate and regular rhythm.     Heart sounds: Normal heart sounds. No murmur heard.   Pulmonary:     Effort: Pulmonary effort is normal.     Breath sounds: Normal breath sounds. No wheezing, rhonchi or rales.  Musculoskeletal:  Cervical back: Neck supple.  Skin:    General: Skin is warm.     Findings: No rash.  Neurological:     Mental Status: He is alert and oriented to person, place, and time.  Psychiatric:        Behavior: Behavior normal.    Previous notes and tests were reviewed. The plan was reviewed with the patient/family, and all questions/concerned were addressed.  It was my pleasure to see Trypp today and participate in his care. Please feel free to contact me with any questions or concerns.  Sincerely,  Wyline Mood, DO Allergy &  Immunology  [facility] office: [phone] [facility] office: [phone]

## 2020-11-19 ENCOUNTER — Other Ambulatory Visit: Payer: Self-pay

## 2020-11-19 ENCOUNTER — Ambulatory Visit: Payer: 59 | Admitting: Allergy

## 2020-11-19 ENCOUNTER — Ambulatory Visit (HOSPITAL_COMMUNITY)
Admission: RE | Admit: 2020-11-19 | Discharge: 2020-11-19 | Disposition: A | Discharge status: Home / Self Care | Payer: 59 | Source: Ambulatory Visit | Attending: Urology | Admitting: Urology

## 2020-11-19 ENCOUNTER — Ambulatory Visit (INDEPENDENT_AMBULATORY_CARE_PROVIDER_SITE_OTHER): Payer: 59 | Admitting: *Deleted

## 2020-11-19 DIAGNOSIS — R972 Elevated prostate specific antigen [PSA]: Secondary | ICD-10-CM | POA: Diagnosis not present

## 2020-11-19 DIAGNOSIS — N402 Nodular prostate without lower urinary tract symptoms: Secondary | ICD-10-CM | POA: Diagnosis not present

## 2020-11-19 DIAGNOSIS — R590 Localized enlarged lymph nodes: Secondary | ICD-10-CM | POA: Diagnosis not present

## 2020-11-19 DIAGNOSIS — J3089 Other allergic rhinitis: Secondary | ICD-10-CM

## 2020-11-19 DIAGNOSIS — Z96641 Presence of right artificial hip joint: Secondary | ICD-10-CM | POA: Diagnosis not present

## 2020-11-19 DIAGNOSIS — J4550 Severe persistent asthma, uncomplicated: Secondary | ICD-10-CM

## 2020-11-19 MED ORDER — GADOBUTROL 1 MMOL/ML IV SOLN
7.0000 mL | Freq: Once | INTRAVENOUS | Status: AC | PRN
  Administered 2020-11-19: 7 mL via INTRAVENOUS

## 2020-11-19 MED FILL — XOLAIR 150 MG SOLR: 150 | 28 days supply | Qty: 6 | Fill #4

## 2020-11-21 ENCOUNTER — Ambulatory Visit (HOSPITAL_COMMUNITY): Admission: RE | Admit: 2020-11-21 | Payer: 59 | Source: Ambulatory Visit

## 2020-11-23 ENCOUNTER — Other Ambulatory Visit: Payer: Self-pay

## 2020-11-23 ENCOUNTER — Emergency Department (HOSPITAL_COMMUNITY)
Admission: EM | Admit: 2020-11-23 | Discharge: 2020-11-23 | Disposition: A | Discharge status: Home / Self Care | Payer: 59 | Attending: Emergency Medicine | Admitting: Emergency Medicine

## 2020-11-23 ENCOUNTER — Encounter (HOSPITAL_COMMUNITY): Payer: Self-pay

## 2020-11-23 DIAGNOSIS — J45909 Unspecified asthma, uncomplicated: Secondary | ICD-10-CM | POA: Insufficient documentation

## 2020-11-23 DIAGNOSIS — Z23 Encounter for immunization: Secondary | ICD-10-CM | POA: Diagnosis not present

## 2020-11-23 DIAGNOSIS — I10 Essential (primary) hypertension: Secondary | ICD-10-CM | POA: Insufficient documentation

## 2020-11-23 DIAGNOSIS — S91111A Laceration without foreign body of right great toe without damage to nail, initial encounter: Secondary | ICD-10-CM | POA: Diagnosis not present

## 2020-11-23 DIAGNOSIS — Z96641 Presence of right artificial hip joint: Secondary | ICD-10-CM | POA: Insufficient documentation

## 2020-11-23 DIAGNOSIS — W228XXA Striking against or struck by other objects, initial encounter: Secondary | ICD-10-CM | POA: Diagnosis not present

## 2020-11-23 DIAGNOSIS — Z87891 Personal history of nicotine dependence: Secondary | ICD-10-CM | POA: Diagnosis not present

## 2020-11-23 DIAGNOSIS — S91311A Laceration without foreign body, right foot, initial encounter: Secondary | ICD-10-CM | POA: Insufficient documentation

## 2020-11-23 DIAGNOSIS — S99921A Unspecified injury of right foot, initial encounter: Secondary | ICD-10-CM | POA: Diagnosis present

## 2020-11-23 DIAGNOSIS — W19XXXA Unspecified fall, initial encounter: Secondary | ICD-10-CM | POA: Diagnosis not present

## 2020-11-23 DIAGNOSIS — Z7951 Long term (current) use of inhaled steroids: Secondary | ICD-10-CM | POA: Diagnosis not present

## 2020-11-23 DIAGNOSIS — Z79899 Other long term (current) drug therapy: Secondary | ICD-10-CM | POA: Diagnosis not present

## 2020-11-23 DIAGNOSIS — R5381 Other malaise: Secondary | ICD-10-CM | POA: Diagnosis not present

## 2020-11-23 MED ORDER — TETANUS-DIPHTH-ACELL PERTUSSIS 5-2.5-18.5 LF-MCG/0.5 IM SUSY
0.5000 mL | PREFILLED_SYRINGE | Freq: Once | INTRAMUSCULAR | Status: AC
  Administered 2020-11-23: 0.5 mL via INTRAMUSCULAR
  Filled 2020-11-23: qty 0.5

## 2020-11-23 MED ORDER — LIDOCAINE HCL (PF) 1 % IJ SOLN
30.0000 mL | Freq: Once | INTRAMUSCULAR | Status: AC
  Administered 2020-11-23: 30 mL
  Filled 2020-11-23: qty 30

## 2020-11-23 NOTE — ED Notes (Signed)
MD at bedside completing irrigation and sutures.

## 2020-11-23 NOTE — ED Notes (Addendum)
Pt gave verbal consent to receive TDAP vaccine. Pt educated on TDAP vaccine. Lot number [id number]. Exp date 07/30/22.

## 2020-11-23 NOTE — ED Notes (Signed)
Pt via ems to [facility]. Agree with triage note. Vitals taken. NAD. Respirations regular/unlabored. Connected to BP and pulse ox.

## 2020-11-23 NOTE — ED Triage Notes (Signed)
Pt via ems right foot big toe laceration. Bleeding controlled. Pt reports walking and lacerated toe on metal flooring piece. Pt ambulatory. Pt reports having some beers.

## 2020-11-23 NOTE — Discharge Instructions (Addendum)
1. Medications: Tylenol or ibuprofen for pain, usual home medications  2. Treatment: ice for swelling, keep wound clean with warm soap and water and keep bandage dry, do not submerge in water for 24 hours. -Elevate your foot to help with pain and throbbing  3. Follow Up: Please return in 10 days to have your stitches removed or sooner if you have concerns. Return to the emergency department for increased redness, drainage of pus from the wound   WOUND CARE  Keep area clean and dry for 24 hours. Do not remove bandage, if applied.  After 24 hours, remove bandage and wash wound gently with mild soap and warm water. Reapply a new bandage after cleaning wound, if directed.   Continue daily cleansing with soap and water until stitches/staples are removed.  Do not apply any ointments or creams to the wound while stitches/staples are in place, as this may cause delayed healing. Return if you experience any of the following signs of infection: Swelling, redness, pus drainage, streaking, fever >101.0 F  Return if you experience excessive bleeding that does not stop after 15-20 minutes of constant, firm pressure.

## 2020-11-23 NOTE — ED Provider Notes (Signed)
[facility]-EMERGENCY DEPT Provider Note   CSN: [id number] Arrival date & time: 11/23/20  2007     History Chief Complaint  Patient presents with  . Extremity Laceration    Male [name] is a 55 y.o. male with past medical history as listed below.  Tetanus is not up-to-date.  HPI Patient presents to emergency room today with chief complaint of  extremity laceration happening just prior to arrival.  He states he was walking upstairs and accidentally hit his right foot on a piece of metal.  He states he recently had his flooring replaced and they did not put the molding around the edge so there is exposed metal.  He reports pain localized to the laceration that he describes as throbbing.  Pain is 2 out of 10 in severity.  He not take any medications for symptoms prior to arrival.  He denies any numbness, tingling or weakness.  She arrived to emergency department via EMS.  He admits to having 2 beers today therefore did not want to drive here.    Past Medical History:  Diagnosis Date  . Asthma   . Avascular necrosis (HCC)   . Hypertension   . Sarcoidosis   . Sickle cell anemia [facility])     Patient Active Problem List   Diagnosis Date Noted  . D-dimer, elevated 03/25/2020  . Left-sided chest pain 03/25/2020  . Hyperglycemia 03/21/2019  . Colon cancer screening 03/21/2019  . Hypertriglyceridemia 03/21/2019  . Avascular necrosis of bone of right hip (HCC) 11/01/2018  . Avascular necrosis of bone of hip, left (HCC) 11/01/2018  . Avascular necrosis of hip, unspecified laterality (HCC) 10/24/2018  . Spinal stenosis at L4-L5 level 08/23/2018  . Essential hypertension, benign 05/24/2016  . DDD (degenerative disc disease), lumbar 02/04/2016  . Routine general medical examination at a health care facility 12/31/2015  . Carpal tunnel syndrome of right wrist 10/02/2011  . Alcohol abuse 01/20/2009  . Perennial and seasonal allergic rhinitis 01/20/2009  . Severe  persistent asthma 01/02/2009  . Sarcoidosis (HCC) 11/10/2007    Past Surgical History:  Procedure Laterality Date  . I & D EXTREMITY  01/19/2012   Procedure: IRRIGATION AND DEBRIDEMENT EXTREMITY; right foot, Surgeon: Nadara Mustard, MD;  Location: [facility] OR;  Service: Orthopedics;  Laterality: Right;  . SHOULDER OPEN ROTATOR CUFF REPAIR  2007  . TOTAL HIP ARTHROPLASTY Right 11/10/2018   Procedure: RIGHT TOTAL HIP ARTHROPLASTY ANTERIOR APPROACH;  Surgeon: Kathryne Hitch, MD;  Location: [facility] ORS;  Service: Orthopedics;  Laterality: Right;       Family History  Problem Relation Age of Onset  . Asthma Mother   . Coronary artery disease Mother   . Stroke Mother   . Heart attack Mother   . Sickle cell trait Mother   . Prostate cancer Father   . Cancer Father 48       prostate  . Alcohol abuse Brother   . Early death Brother   . Drug abuse Brother   . Diabetes Neg Hx   . Heart disease Neg Hx   . Hyperlipidemia Neg Hx   . Hypertension Neg Hx   . Kidney disease Neg Hx     Social History   Tobacco Use  . Smoking status: Former Smoker    Packs/day: 0.50    Years: 10.00    Pack years: 5.00    Quit date: 10/04/1993    Years since quitting: 27.1  . Smokeless tobacco: Never Used  Vaping Use  .  Vaping Use: Never used  Substance Use Topics  . Alcohol use: Yes    Alcohol/week: 28.0 standard drinks    Types: 14 Cans of beer, 14 Shots of liquor per week  . Drug use: No    Home Medications Prior to Admission medications   Medication Sig Start Date End Date Taking? Authorizing Provider  albuterol (VENTOLIN HFA) 108 (90 Base) MCG/ACT inhaler Inhale 2 puffs into the lungs every 6 (six) hours as needed for wheezing or shortness of breath.    [provider]  Azelastine HCl 0.15 % SOLN Place 1-2 sprays into both nostrils 2 (two) times daily as needed (runny nose). 08/07/20   Ellamae Sia, DO  diclofenac Sodium (VOLTAREN) 1 % GEL APPLY 2 TO 4 GRAMS TO AFFECTED AREA(S) FOUR TIMES  DAILY 10/27/20   Kathryne Hitch, MD  EPINEPHrine 0.3 mg/0.3 mL IJ SOAJ injection Inject 0.3 mg into the muscle as needed for anaphylaxis.  11/09/17   [provider]  fluticasone (FLONASE) 50 MCG/ACT nasal spray Place 1 spray into both nostrils in the morning and at bedtime. For nasal congestion 08/07/20   Ellamae Sia, DO  ibuprofen (ADVIL) 800 MG tablet TAKE 1 TABLET BY MOUTH EVERY 8 HOURS AS NEEDED 06/30/20   Etta Grandchild, MD  levalbuterol [facility] HFA) 45 MCG/ACT inhaler Inhale 1-2 puffs into the lungs every 6 (six) hours as needed for wheezing or shortness of breath. 08/07/20   Ellamae Sia, DO  levalbuterol Pauline Aus) 0.63 MG/3ML nebulizer solution Take 3 mLs (0.63 mg total) by nebulization every 8 (eight) hours as needed for wheezing or shortness of breath. 03/24/20   Etta Grandchild, MD  montelukast (SINGULAIR) 10 MG tablet Take 1 tablet (10 mg total) by mouth at bedtime. 03/24/20   Etta Grandchild, MD  montelukast (SINGULAIR) 10 MG tablet Take 1 tablet (10 mg total) by mouth at bedtime. 08/07/20   Ellamae Sia, DO  nebivolol (BYSTOLIC) 5 MG tablet Take 1 tablet (5 mg total) by mouth daily. 03/24/20   Etta Grandchild, MD  omalizumab Geoffry Paradise) 150 MG injection Inject 375 mg into the skin every 14 (fourteen) days. 07/22/20   Quentin Angst, MD  SYMBICORT 160-4.5 MCG/ACT inhaler Inhale 2 puffs into the lungs in the morning and at bedtime. with spacer and rinse mouth afterwards. 08/07/20   Ellamae Sia, DO  Tiotropium Bromide Monohydrate (SPIRIVA RESPIMAT) 1.25 MCG/ACT AERS Inhale 2 puffs into the lungs daily. 03/24/20   Etta Grandchild, MD    Allergies    Patient has no known allergies.  Review of Systems   Review of Systems All other systems are reviewed and are negative for acute change except as noted in the HPI.  Physical Exam Updated Vital Signs BP (!) 154/99 (BP Location: Right Arm)   Pulse 98   Temp 99 F (37.2 C) (Oral)   Resp 19   Ht 5\' 11"  (1.803 m)   Wt 71.7 kg    SpO2 100%   BMI 22.04 kg/m   Physical Exam Vitals and nursing note reviewed.  Constitutional:      Appearance: He is well-developed. He is not ill-appearing or toxic-appearing.  HENT:     Head: Normocephalic and atraumatic.     Nose: Nose normal.  Eyes:     General: No scleral icterus.       Right eye: No discharge.        Left eye: No discharge.     Conjunctiva/sclera:  Conjunctivae normal.  Neck:     Vascular: No JVD.  Cardiovascular:     Rate and Rhythm: Normal rate and regular rhythm.     Pulses: Normal pulses.          Dorsalis pedis pulses are 2+ on the right side and 2+ on the left side.     Heart sounds: Normal heart sounds.  Pulmonary:     Effort: Pulmonary effort is normal.     Breath sounds: Normal breath sounds.  Abdominal:     General: There is no distension.  Musculoskeletal:        General: Normal range of motion.     Cervical back: Normal range of motion.       Feet:     Comments: Able to flex and extend right great toe without difficulty.  Sensation intact to right great toe.  Feet:     Right foot:     Toenail Condition: Right toenails are abnormally thick.     Left foot:     Toenail Condition: Left toenails are abnormally thick.     Comments: 4 cm linear laceration on plantar aspect of right great toe. Bleeding controlled with dressing. Skin:    General: Skin is warm and dry.  Neurological:     Mental Status: He is oriented to person, place, and time.     GCS: GCS eye subscore is 4. GCS verbal subscore is 5. GCS motor subscore is 6.     Comments: Fluent speech, no facial droop.  Psychiatric:        Behavior: Behavior normal.     ED Results / Procedures / Treatments   Labs (all labs ordered are listed, but only abnormal results are displayed) Labs Reviewed - No data to display  EKG None  Radiology No results found.  Procedures .Marland KitchenLaceration Repair  Date/Time: 11/23/2020 10:26 PM Performed by: Shanon Ace, PA-C Authorized  by: Shanon Ace, PA-C   Consent:    Consent obtained:  Verbal   Consent given by:  Patient   Risks discussed:  Infection, poor wound healing, nerve damage, vascular damage and tendon damage Universal protocol:    Patient identity confirmed:  Verbally with patient Anesthesia:    Anesthesia method:  Local infiltration   Local anesthetic:  Lidocaine 1% w/o epi Laceration details:    Location:  Toe   Toe location:  R big toe   Length (cm):  4   Depth (mm):  4 Exploration:    Hemostasis achieved with:  Direct pressure   Wound exploration: wound explored through full range of motion and entire depth of wound visualized     Wound extent: no foreign bodies/material noted, no muscle damage noted, no nerve damage noted, no tendon damage noted and no vascular damage noted   Treatment:    Area cleansed with:  Saline   Irrigation solution:  Sterile saline   Irrigation volume:  1000 ml   Irrigation method:  Syringe Skin repair:    Repair method:  Sutures   Suture size:  4-0   Suture material:  Prolene   Suture technique:  Simple interrupted   Number of sutures:  3 Repair type:    Repair type:  Simple Post-procedure details:    Dressing:  Bulky dressing   Procedure completion:  Tolerated well, no immediate complications     Medications Ordered in ED Medications  lidocaine (PF) (XYLOCAINE) 1 % injection 30 mL (30 mLs Infiltration Given by Other 11/23/20 2206)  Tdap (BOOSTRIX) injection 0.5 mL (0.5 mLs Intramuscular Given 11/23/20 2210)    ED Course  I have reviewed the triage vital signs and the nursing notes.  Pertinent labs & imaging results that were available during my care of the patient were reviewed by me and considered in my medical decision making (see chart for details).    MDM Rules/Calculators/A&P                          History provided by patient with additional history obtained from chart review.    Patient presents to the emergency department with  laceration to plantar aspect of right great toe which occurred within 1 hours PTA . Patient nontoxic appearing, resting comfortably. He has full range of motion of toe, no tendon damage noted. Pressure irrigation performed. Wound explored and base of wound visualized in a bloodless field without evidence of foreign body. Laceration repair per procedure note above, tolerated well. Tetanus updated at today's visit. Do not feel that abx are indicated at this time based on wound appearance and lack of significant comorbidities. Discussed suture home care as well as need for wound recheck and suture removal in 10 days.  I discussed results, treatment plan, need for follow-up, and return precautions with the patient including signs of infection. Provided opportunity for questions, patient confirmed understanding and is in agreement with plan.     Portions of this note were generated with Scientist, clinical (histocompatibility and immunogenetics)Dragon dictation software. Dictation errors may occur despite best attempts at proofreading.   Final Clinical Impression(s) / ED Diagnoses Final diagnoses:  Laceration of right foot, initial encounter    Rx / DC Orders ED Discharge Orders    None       Kandice HamsWalisiewicz, Ellayna Hilligoss E, PA-C 11/23/20 2232    Bethann BerkshireZammit, Joseph, MD 11/24/20 1619    Shanon AceWalisiewicz, Nilan Iddings E, PA-C 11/25/20 2128    Bethann BerkshireZammit, Joseph, MD 11/26/20 1517

## 2020-11-26 MED FILL — IBUPROFEN 800 MG TAB: 800 | 30 days supply | Qty: 90 | Fill #2

## 2020-11-28 DIAGNOSIS — N402 Nodular prostate without lower urinary tract symptoms: Secondary | ICD-10-CM | POA: Diagnosis not present

## 2020-11-28 DIAGNOSIS — R972 Elevated prostate specific antigen [PSA]: Secondary | ICD-10-CM | POA: Diagnosis not present

## 2020-12-03 ENCOUNTER — Ambulatory Visit (INDEPENDENT_AMBULATORY_CARE_PROVIDER_SITE_OTHER): Payer: 59 | Admitting: Internal Medicine

## 2020-12-03 ENCOUNTER — Ambulatory Visit (INDEPENDENT_AMBULATORY_CARE_PROVIDER_SITE_OTHER): Payer: 59 | Admitting: *Deleted

## 2020-12-03 ENCOUNTER — Encounter: Payer: Self-pay | Admitting: Internal Medicine

## 2020-12-03 ENCOUNTER — Other Ambulatory Visit: Payer: Self-pay

## 2020-12-03 VITALS — BP 130/88 | HR 93 | Ht 71.0 in | Wt 156.0 lb

## 2020-12-03 DIAGNOSIS — R739 Hyperglycemia, unspecified: Secondary | ICD-10-CM

## 2020-12-03 DIAGNOSIS — I10 Essential (primary) hypertension: Secondary | ICD-10-CM | POA: Diagnosis not present

## 2020-12-03 DIAGNOSIS — J4550 Severe persistent asthma, uncomplicated: Secondary | ICD-10-CM

## 2020-12-03 DIAGNOSIS — S91111D Laceration without foreign body of right great toe without damage to nail, subsequent encounter: Secondary | ICD-10-CM | POA: Diagnosis not present

## 2020-12-03 NOTE — Patient Instructions (Signed)
Your 3 stitches were removed today  Ok to use neosporin and gauze covering for 1-2 days, but it should heal well from there without specific other treatment  Please continue all other medications as before, and refills have been done if requested.  Please have the pharmacy call with any other refills you may need.  Please keep your appointments with your specialists as you may have planned

## 2020-12-03 NOTE — Progress Notes (Signed)
Patient ID: [name], male   DOB: 08-Aug-1966, 55 y.o.   MRN: [id number]        Chief Complaint: toe laceration with stitches to be removed       HPI:  [name] is a 55 y.o. male here with c/o recent laceration to the base of the right great toe after a mis step, with 3 stitches placed, tetanus updated, and now here for stitch removal.  Pt states no worsening bleeding, redness, swelling or pain.  Pt denies chest pain, increased sob or doe, wheezing, orthopnea, PND, increased LE swelling, palpitations, dizziness or syncope.   Pt denies polydipsia, polyuria, No other new complaints       Wt Readings from Last 3 Encounters:  12/03/20 156 lb (70.8 kg)  11/23/20 158 lb (71.7 kg)  08/07/20 158 lb (71.7 kg)   BP Readings from Last 3 Encounters:  12/03/20 130/88  11/23/20 (!) 154/99  08/07/20 122/76         Past Medical History:  Diagnosis Date  . Asthma   . Avascular necrosis (HCC)   . Hypertension   . Sarcoidosis   . Sickle cell anemia (HCC)    Past Surgical History:  Procedure Laterality Date  . I & D EXTREMITY  01/19/2012   Procedure: IRRIGATION AND DEBRIDEMENT EXTREMITY; right foot, Surgeon: Nadara Mustard, MD;  Location: [facility];  Service: Orthopedics;  Laterality: Right;  . SHOULDER OPEN ROTATOR CUFF REPAIR  2007  . TOTAL HIP ARTHROPLASTY Right 11/10/2018   Procedure: RIGHT TOTAL HIP ARTHROPLASTY ANTERIOR APPROACH;  Surgeon: Kathryne Hitch, MD;  Location: [facility];  Service: Orthopedics;  Laterality: Right;    reports that he quit smoking about 27 years ago. He has a 5.00 pack-year smoking history. He has never used smokeless tobacco. He reports current alcohol use of about 28.0 standard drinks of alcohol per week. He reports that he does not use drugs. family history includes Alcohol abuse in his brother; Asthma in his mother; Cancer (age of onset: 51) in his father; Coronary artery disease in his mother; Drug abuse in his brother; Early death in his brother; Heart  attack in his mother; Prostate cancer in his father; Sickle cell trait in his mother; Stroke in his mother. No Known Allergies Current Outpatient Medications on File Prior to Visit  Medication Sig Dispense Refill  . albuterol (VENTOLIN HFA) 108 (90 Base) MCG/ACT inhaler Inhale 2 puffs into the lungs every 6 (six) hours as needed for wheezing or shortness of breath.    . Azelastine HCl 0.15 % SOLN Place 1-2 sprays into both nostrils 2 (two) times daily as needed (runny nose). 30 mL 5  . diclofenac Sodium (VOLTAREN) 1 % GEL APPLY 2 TO 4 GRAMS TO AFFECTED AREA(S) FOUR TIMES DAILY 300 g 2  . EPINEPHrine 0.3 mg/0.3 mL IJ SOAJ injection Inject 0.3 mg into the muscle as needed for anaphylaxis.   2  . fluticasone (FLONASE) 50 MCG/ACT nasal spray Place 1 spray into both nostrils in the morning and at bedtime. For nasal congestion 16 g 5  . ibuprofen (ADVIL) 800 MG tablet TAKE 1 TABLET BY MOUTH EVERY 8 HOURS AS NEEDED 90 tablet 2  . levalbuterol (XOPENEX HFA) 45 MCG/ACT inhaler Inhale 1-2 puffs into the lungs every 6 (six) hours as needed for wheezing or shortness of breath. 15 g 1  . levalbuterol (XOPENEX) 0.63 MG/3ML nebulizer solution Take 3 mLs (0.63 mg total) by nebulization every 8 (eight) hours as needed for wheezing  or shortness of breath. 150 mL 3  . montelukast (SINGULAIR) 10 MG tablet Take 1 tablet (10 mg total) by mouth at bedtime. 90 tablet 1  . montelukast (SINGULAIR) 10 MG tablet Take 1 tablet (10 mg total) by mouth at bedtime. 30 tablet 5  . nebivolol (BYSTOLIC) 5 MG tablet Take 1 tablet (5 mg total) by mouth daily. 90 tablet 1  . omalizumab (XOLAIR) 150 MG injection Inject 375 mg into the skin every 14 (fourteen) days. 6 each 11  . SYMBICORT 160-4.5 MCG/ACT inhaler Inhale 2 puffs into the lungs in the morning and at bedtime. with spacer and rinse mouth afterwards. 1 each 5  . Tiotropium Bromide Monohydrate (SPIRIVA RESPIMAT) 1.25 MCG/ACT AERS Inhale 2 puffs into the lungs daily. 12 g 1    Current Facility-Administered Medications on File Prior to Visit  Medication Dose Route Frequency Provider Last Rate Last Admin  . omalizumab Geoffry Paradise) injection 150 mg  150 mg Subcutaneous Q14 Days Marcelyn Bruins, MD   150 mg at 10/22/20 1045  . omalizumab Geoffry Paradise) injection 375 mg  375 mg Subcutaneous Q14 Days Ellamae Sia, DO   375 mg at 12/03/20 1621        ROS:  All others reviewed and negative.  Objective        PE:  BP 130/88   Pulse 93   Ht 5\' 11"  (1.803 m)   Wt 156 lb (70.8 kg)   SpO2 96%   BMI 21.76 kg/m                 Constitutional: Pt appears in NAD               HENT: Head: NCAT.                Right Ear: External ear normal.                 Left Ear: External ear normal.                Eyes: . Pupils are equal, round, and reactive to light. Conjunctivae and EOM are normal               Nose: without d/c or deformity               Neck: Neck supple. Gross normal ROM               Cardiovascular: Normal rate and regular rhythm.                 Pulmonary/Chest: Effort normal and breath sounds without rales or wheezing.                Abd:  Soft, NT, ND, + BS, no organomegaly               Neurological: Pt is alert. At baseline orientation, motor grossly intact               Skin - LE edema - none, right great toe planta based with intact recent 1 cm laceration without bleeding, redness, swelling; 3 stitches removed without complication               Psychiatric: Pt behavior is normal without agitation   Micro: none  Cardiac tracings I have personally interpreted today:  none  Pertinent Radiological findings (summarize): none   Lab Results  Component Value Date   WBC 6.7 03/24/2020   HGB 16.4 03/24/2020   HCT  47.1 03/24/2020   PLT 263.0 03/24/2020   GLUCOSE 85 03/24/2020   CHOL 210 (H) 03/24/2020   TRIG 195.0 (H) 03/24/2020   HDL 66.30 03/24/2020   LDLDIRECT 113.0 03/21/2019   LDLCALC 104 (H) 03/24/2020   ALT 24 03/24/2020   AST 24 03/24/2020    NA 140 03/24/2020   K 3.9 03/24/2020   CL 103 03/24/2020   CREATININE 1.08 03/24/2020   BUN 15 03/24/2020   CO2 29 03/24/2020   TSH 3.65 03/24/2020   PSA 1.58 10/29/2019   HGBA1C 5.6 03/21/2019   Assessment/Plan:  [name] is a 55 y.o. Black or African American [2] male with  has a past medical history of Asthma, Avascular necrosis (HCC), Hypertension, Sarcoidosis, and Sickle cell anemia (HCC).  Toe laceration Resolved without cellulitis or other complication, stitches removed, to continue to follow with conservative management  Hyperglycemia Lab Results  Component Value Date   HGBA1C 5.6 03/21/2019   Stable, pt to continue current medical treatment  - diet and wt control   Essential hypertension, benign BP Readings from Last 3 Encounters:  12/03/20 130/88  11/23/20 (!) 154/99  08/07/20 122/76   Stable, pt to continue medical treatment bystolic 5   Followup: Return if symptoms worsen or fail to improve.  Oliver Barre, MD 12/10/2020 8:18 AM [facility]

## 2020-12-09 ENCOUNTER — Other Ambulatory Visit (HOSPITAL_COMMUNITY): Payer: Self-pay | Admitting: Pharmacist

## 2020-12-09 MED FILL — CARESTART COVID-19 HOME TES: 4 days supply | Qty: 4 | Fill #0

## 2020-12-10 ENCOUNTER — Encounter: Payer: Self-pay | Admitting: Internal Medicine

## 2020-12-10 DIAGNOSIS — S91119A Laceration without foreign body of unspecified toe without damage to nail, initial encounter: Secondary | ICD-10-CM | POA: Insufficient documentation

## 2020-12-10 NOTE — Assessment & Plan Note (Signed)
Lab Results  Component Value Date   HGBA1C 5.6 03/21/2019   Stable, pt to continue current medical treatment  - diet and wt control

## 2020-12-10 NOTE — Assessment & Plan Note (Signed)
Resolved without cellulitis or other complication, stitches removed, to continue to follow with conservative management

## 2020-12-10 NOTE — Assessment & Plan Note (Signed)
BP Readings from Last 3 Encounters:  12/03/20 130/88  11/23/20 (!) 154/99  08/07/20 122/76   Stable, pt to continue medical treatment bystolic 5

## 2020-12-17 ENCOUNTER — Other Ambulatory Visit: Payer: Self-pay

## 2020-12-17 ENCOUNTER — Ambulatory Visit (INDEPENDENT_AMBULATORY_CARE_PROVIDER_SITE_OTHER): Payer: 59

## 2020-12-17 DIAGNOSIS — J4550 Severe persistent asthma, uncomplicated: Secondary | ICD-10-CM

## 2020-12-26 ENCOUNTER — Other Ambulatory Visit (HOSPITAL_COMMUNITY): Payer: Self-pay

## 2020-12-31 ENCOUNTER — Ambulatory Visit (INDEPENDENT_AMBULATORY_CARE_PROVIDER_SITE_OTHER): Payer: 59 | Admitting: *Deleted

## 2020-12-31 DIAGNOSIS — J4540 Moderate persistent asthma, uncomplicated: Secondary | ICD-10-CM

## 2021-01-05 ENCOUNTER — Other Ambulatory Visit (HOSPITAL_COMMUNITY): Payer: Self-pay

## 2021-01-05 MED FILL — Omalizumab For Inj 150 MG: SUBCUTANEOUS | 28 days supply | Qty: 6 | Fill #0 | Status: AC

## 2021-01-06 ENCOUNTER — Other Ambulatory Visit (HOSPITAL_COMMUNITY): Payer: Self-pay

## 2021-01-07 ENCOUNTER — Other Ambulatory Visit (HOSPITAL_COMMUNITY): Payer: Self-pay

## 2021-01-13 ENCOUNTER — Ambulatory Visit (INDEPENDENT_AMBULATORY_CARE_PROVIDER_SITE_OTHER): Payer: 59

## 2021-01-13 DIAGNOSIS — J4540 Moderate persistent asthma, uncomplicated: Secondary | ICD-10-CM

## 2021-01-14 ENCOUNTER — Ambulatory Visit: Payer: Self-pay

## 2021-01-27 ENCOUNTER — Other Ambulatory Visit: Payer: Self-pay

## 2021-01-27 ENCOUNTER — Ambulatory Visit: Payer: Self-pay

## 2021-01-27 ENCOUNTER — Ambulatory Visit: Payer: 59 | Attending: Family Medicine | Admitting: Pharmacist

## 2021-01-27 DIAGNOSIS — Z79899 Other long term (current) drug therapy: Secondary | ICD-10-CM

## 2021-01-27 NOTE — Progress Notes (Signed)
   S: Patient presents for review of their specialty medication therapy.  Patient is currently taking Xolair for asthma. Patient is managed by Dr. Nunzio Cobbs for this.  Adherence: confirms  Efficacy: reports doing well overall  Dosing: SubQ 375 mg every 2 weeks   Dose adjustments: Renal: no dose adjustments  Hepatic: no dose adjustments  Toxicity: Severe hypersensitivity reaction or anaphylaxis: none Fever, arthralgia, and rash: None  Drug-drug interactions: none  Monitoring: CV effects: none Eosinophilia and vasculitis: none Fever/arthralgia/rash: none  Hypersensitivity/Anaphylaxis: none; endorses some minor itching at the injection site but denies s/sx of severe hypersensitivity or anaphylaxis Malignant neoplasms: none  Pulmonary function tests (for asthma): spirometry last resulted 08/07/2020.  O: Lab Results  Component Value Date   WBC 6.7 03/24/2020   HGB 16.4 03/24/2020   HCT 47.1 03/24/2020   MCV 82.9 03/24/2020   PLT 263.0 03/24/2020     Chemistry      Component Value Date/Time   NA 140 03/24/2020 1536   K 3.9 03/24/2020 1536   CL 103 03/24/2020 1536   CO2 29 03/24/2020 1536   BUN 15 03/24/2020 1536   CREATININE 1.08 03/24/2020 1536   CREATININE 0.98 04/18/2013 1034      Component Value Date/Time   CALCIUM 9.6 03/24/2020 1536   ALKPHOS 52 03/24/2020 1536   AST 24 03/24/2020 1536   ALT 24 03/24/2020 1536   BILITOT 0.5 03/24/2020 1536      A/P: 1. Medication review: patient currently on Xolair for asthma. Reviewed the medication with the patient, including the following: Xolair, omalizumab, is a novel IgE blocker.  It appears to reduce rates of hospitalizations, ER visits and unscheduled physician visits due asthma exacerbations when added to standard therapy.  Studies also show a reduction in steroid requirements and improvement in quality of life.  Patient educated on purpose, proper use and potential adverse effects of Xolair.  Following instruction  patient verbalized understanding. Patient should always have an EpiPen readily available in the event of anaphylaxis.   SubQ: For SubQ injection only; doses >150 mg should be divided over more than one injection site (eg, 225 mg or 300 mg administered as two injections, 375 mg administered as three injections); each injection site should be separated by ?1 inch. Do not inject into moles, scars, bruises, tender areas, or broken skin. Injections may take 5 to 10 seconds to administer (solution is slightly viscous). Administer only under direct medical supervision and observe patient for 2 hours after the first 3 injections and 30 minutes after subsequent injections Angela Nevin 2015) or in accordance with individual institution policies and procedures.   No recommendations for any changes at this time.   Butch Penny, PharmD, Patsy Baltimore, CPP Clinical Pharmacist [facility] [phone]

## 2021-01-28 ENCOUNTER — Other Ambulatory Visit: Payer: Self-pay

## 2021-01-28 ENCOUNTER — Ambulatory Visit (INDEPENDENT_AMBULATORY_CARE_PROVIDER_SITE_OTHER): Payer: 59

## 2021-01-28 DIAGNOSIS — J4540 Moderate persistent asthma, uncomplicated: Secondary | ICD-10-CM

## 2021-02-02 ENCOUNTER — Other Ambulatory Visit (HOSPITAL_COMMUNITY): Payer: Self-pay

## 2021-02-02 MED FILL — Omalizumab For Inj 150 MG: SUBCUTANEOUS | 28 days supply | Qty: 6 | Fill #1 | Status: AC

## 2021-02-09 ENCOUNTER — Other Ambulatory Visit (HOSPITAL_COMMUNITY): Payer: Self-pay

## 2021-02-11 ENCOUNTER — Ambulatory Visit (INDEPENDENT_AMBULATORY_CARE_PROVIDER_SITE_OTHER): Payer: 59

## 2021-02-11 ENCOUNTER — Other Ambulatory Visit: Payer: Self-pay

## 2021-02-11 DIAGNOSIS — J4540 Moderate persistent asthma, uncomplicated: Secondary | ICD-10-CM

## 2021-02-25 ENCOUNTER — Other Ambulatory Visit: Payer: Self-pay

## 2021-02-25 ENCOUNTER — Ambulatory Visit (INDEPENDENT_AMBULATORY_CARE_PROVIDER_SITE_OTHER): Payer: 59

## 2021-02-25 DIAGNOSIS — J4540 Moderate persistent asthma, uncomplicated: Secondary | ICD-10-CM | POA: Diagnosis not present

## 2021-02-26 ENCOUNTER — Other Ambulatory Visit (HOSPITAL_COMMUNITY): Payer: Self-pay

## 2021-02-26 ENCOUNTER — Other Ambulatory Visit: Payer: Self-pay | Admitting: Internal Medicine

## 2021-02-26 DIAGNOSIS — G8929 Other chronic pain: Secondary | ICD-10-CM

## 2021-02-26 DIAGNOSIS — M25551 Pain in right hip: Secondary | ICD-10-CM

## 2021-02-26 DIAGNOSIS — M48061 Spinal stenosis, lumbar region without neurogenic claudication: Secondary | ICD-10-CM

## 2021-02-26 MED ORDER — IBUPROFEN 800 MG PO TABS
ORAL_TABLET | Freq: Three times a day (TID) | ORAL | 2 refills | Status: DC | PRN
  Filled 2021-02-26 – 2021-03-09 (×2): qty 90, 30d supply, fill #0
  Filled 2021-06-25: qty 90, 30d supply, fill #1

## 2021-03-03 ENCOUNTER — Other Ambulatory Visit (HOSPITAL_COMMUNITY): Payer: Self-pay

## 2021-03-04 ENCOUNTER — Ambulatory Visit: Payer: 59 | Admitting: Internal Medicine

## 2021-03-04 ENCOUNTER — Other Ambulatory Visit: Payer: Self-pay

## 2021-03-04 ENCOUNTER — Encounter: Payer: Self-pay | Admitting: Internal Medicine

## 2021-03-04 DIAGNOSIS — M7702 Medial epicondylitis, left elbow: Secondary | ICD-10-CM | POA: Diagnosis not present

## 2021-03-04 DIAGNOSIS — R739 Hyperglycemia, unspecified: Secondary | ICD-10-CM | POA: Diagnosis not present

## 2021-03-04 DIAGNOSIS — I10 Essential (primary) hypertension: Secondary | ICD-10-CM | POA: Diagnosis not present

## 2021-03-04 NOTE — Progress Notes (Signed)
Patient ID: [name], male   DOB: May 08, 1966, 55 y.o.   MRN: [id number]        Chief Complaint: left medial elbow pain and swelling       HPI:  [name] is a 55 y.o. male here with c/o above , mild to mod fo 1wk, not sure what caused this but does use arms quite a bit for work; pain is constant, sharp and pressure like, worse to bend the elbow, better to rest.  Pt denies chest pain, increased sob or doe, wheezing, orthopnea, PND, increased LE swelling, palpitations, dizziness or syncope.   Pt denies polydipsia, polyuria, or new foal neuro s/s.         Wt Readings from Last 3 Encounters:  03/04/21 154 lb (69.9 kg)  12/03/20 156 lb (70.8 kg)  11/23/20 158 lb (71.7 kg)   BP Readings from Last 3 Encounters:  03/04/21 136/82  12/03/20 130/88  11/23/20 (!) 154/99         Past Medical History:  Diagnosis Date  . Asthma   . Avascular necrosis (HCC)   . Hypertension   . Sarcoidosis   . Sickle cell anemia (HCC)    Past Surgical History:  Procedure Laterality Date  . I & D EXTREMITY  01/19/2012   Procedure: IRRIGATION AND DEBRIDEMENT EXTREMITY; right foot, Surgeon: Nadara Mustard, MD;  Location: [facility];  Service: Orthopedics;  Laterality: Right;  . SHOULDER OPEN ROTATOR CUFF REPAIR  2007  . TOTAL HIP ARTHROPLASTY Right 11/10/2018   Procedure: RIGHT TOTAL HIP ARTHROPLASTY ANTERIOR APPROACH;  Surgeon: Kathryne Hitch, MD;  Location: [facility];  Service: Orthopedics;  Laterality: Right;    reports that he quit smoking about 27 years ago. He has a 5.00 pack-year smoking history. He has never used smokeless tobacco. He reports current alcohol use of about 28.0 standard drinks of alcohol per week. He reports that he does not use drugs. family history includes Alcohol abuse in his brother; Asthma in his mother; Cancer (age of onset: 45) in his father; Coronary artery disease in his mother; Drug abuse in his brother; Early death in his brother; Heart attack in his mother; Prostate cancer  in his father; Sickle cell trait in his mother; Stroke in his mother. No Known Allergies Current Outpatient Medications on File Prior to Visit  Medication Sig Dispense Refill  . albuterol (VENTOLIN HFA) 108 (90 Base) MCG/ACT inhaler Inhale 2 puffs into the lungs every 6 (six) hours as needed for wheezing or shortness of breath.    . Azelastine HCl 0.15 % SOLN PLACE 1-2 SPRAYS INTO BOTH NOSTRILS 2 TIMES DAILY AS NEEDED (RUNNY NOSE). 30 mL 5  . diclofenac Sodium (VOLTAREN) 1 % GEL APPLY 2 TO 4 GRAMS TO AFFECTED AREA(S) 4 TIMES DAILY 300 g 2  . EPINEPHrine 0.3 mg/0.3 mL IJ SOAJ injection Inject 0.3 mg into the muscle as needed for anaphylaxis.   2  . fluticasone (FLONASE) 50 MCG/ACT nasal spray PLACE 1 SPRAY INTO BOTH NOSTRILS IN THE MORNING AND AT BEDTIME. FOR NASAL CONGESTION 16 g 5  . ibuprofen (ADVIL) 800 MG tablet TAKE 1 TABLET BY MOUTH EVERY 8 HOURS AS NEEDED 90 tablet 2  . levalbuterol (XOPENEX HFA) 45 MCG/ACT inhaler INHALE 1-2 PUFFS INTO THE LUNGS EVERY 6 HOURS AS NEEDED FOR WHEEZING OR SHORTNESS OF BREATH. 15 g 1  . levalbuterol (XOPENEX) 0.63 MG/3ML nebulizer solution Take 3 mLs (0.63 mg total) by nebulization every 8 (eight) hours as needed for wheezing  or shortness of breath. 150 mL 3  . montelukast (SINGULAIR) 10 MG tablet TAKE 1 TABLET BY MOUTH ONCE DAILY AT BEDTIME 30 tablet 5  . nebivolol (BYSTOLIC) 5 MG tablet Take 1 tablet (5 mg total) by mouth daily. 90 tablet 1  . omalizumab (XOLAIR) 150 MG injection INJECT 375 MG INTO THE SKIN EVERY 14 DAYS. 6 each 11  . SYMBICORT 160-4.5 MCG/ACT inhaler INHALE 2 PUFFS INTO THE LUNGS IN THE MORNING AND AT BEDTIME. WITH SPACER AND RINSE MOUTH AFTERWARDS. 10.2 g 5  . Tiotropium Bromide Monohydrate (SPIRIVA RESPIMAT) 1.25 MCG/ACT AERS Inhale 2 puffs into the lungs daily. 12 g 1   No current facility-administered medications on file prior to visit.        ROS:  All others reviewed and negative.  Objective        PE:  BP 136/82 (BP Location:  Right Arm, Patient Position: Sitting, Cuff Size: Normal)   Pulse 96   Temp 98.8 F (37.1 C) (Oral)   Ht 5\' 11"  (1.803 m)   Wt 154 lb (69.9 kg)   SpO2 97%   BMI 21.48 kg/m                 Constitutional: Pt appears in NAD               HENT: Head: NCAT.                Right Ear: External ear normal.                 Left Ear: External ear normal.                Eyes: . Pupils are equal, round, and reactive to light. Conjunctivae and EOM are normal               Nose: without d/c or deformity               Neck: Neck supple. Gross normal ROM               Cardiovascular: Normal rate and regular rhythm.                 Pulmonary/Chest: Effort normal and breath sounds without rales or wheezing.                Left elbow with 1-2+ medial epicondylar tender swelling soft tissue               Neurological: Pt is alert. At baseline orientation, motor grossly intact               Skin: LE edema - none               Psychiatric: Pt behavior is normal without agitation   Micro: none  Cardiac tracings I have personally interpreted today:  none  Pertinent Radiological findings (summarize): none   Lab Results  Component Value Date   WBC 6.7 03/24/2020   HGB 16.4 03/24/2020   HCT 47.1 03/24/2020   PLT 263.0 03/24/2020   GLUCOSE 85 03/24/2020   CHOL 210 (H) 03/24/2020   TRIG 195.0 (H) 03/24/2020   HDL 66.30 03/24/2020   LDLDIRECT 113.0 03/21/2019   LDLCALC 104 (H) 03/24/2020   ALT 24 03/24/2020   AST 24 03/24/2020   NA 140 03/24/2020   K 3.9 03/24/2020   CL 103 03/24/2020   CREATININE 1.08 03/24/2020   BUN 15 03/24/2020   CO2 29 03/24/2020  TSH 3.65 03/24/2020   PSA 1.58 10/29/2019   HGBA1C 5.6 03/21/2019   Assessment/Plan:  [name] is a 55 y.o. Black or African American [2] male with  has a past medical history of Asthma, Avascular necrosis (HCC), Hypertension, Sarcoidosis, and Sickle cell anemia (HCC).  Medial epicondylitis of elbow, left At least moderate, not  clear how occurred, pt alluded to something like sleeping on the arm wrongly, I suspect ETOH use somewhat related but did not address today; arm o/w neurovasc intact, ok for volt gel prn and sports med referral - ? Need cortisone  Hyperglycemia Lab Results  Component Value Date   HGBA1C 5.6 03/21/2019   Stable, pt to continue current medical treatment  - diet   Essential hypertension, benign BP Readings from Last 3 Encounters:  03/04/21 136/82  12/03/20 130/88  11/23/20 (!) 154/99   Stable, pt to continue medical treatment  - bystolic   Followup: Return if symptoms worsen or fail to improve.  Oliver Barre, MD 03/07/2021 10:22 PM [facility] [place] [facility]

## 2021-03-05 ENCOUNTER — Other Ambulatory Visit (HOSPITAL_COMMUNITY): Payer: Self-pay

## 2021-03-05 MED FILL — Omalizumab For Inj 150 MG: SUBCUTANEOUS | 28 days supply | Qty: 6 | Fill #2 | Status: AC

## 2021-03-06 ENCOUNTER — Other Ambulatory Visit (HOSPITAL_COMMUNITY): Payer: Self-pay

## 2021-03-07 ENCOUNTER — Encounter: Payer: Self-pay | Admitting: Internal Medicine

## 2021-03-07 DIAGNOSIS — M7702 Medial epicondylitis, left elbow: Secondary | ICD-10-CM | POA: Insufficient documentation

## 2021-03-07 NOTE — Patient Instructions (Signed)
Ok for OTC voltaren gel as needed

## 2021-03-07 NOTE — Assessment & Plan Note (Signed)
BP Readings from Last 3 Encounters:  03/04/21 136/82  12/03/20 130/88  11/23/20 (!) 154/99   Stable, pt to continue medical treatment  - bystolic

## 2021-03-07 NOTE — Assessment & Plan Note (Signed)
At least moderate, not clear how occurred, pt alluded to something like sleeping on the arm wrongly, I suspect ETOH use somewhat related but did not address today; arm o/w neurovasc intact, ok for volt gel prn and sports med referral - ? Need cortisone

## 2021-03-07 NOTE — Assessment & Plan Note (Signed)
Lab Results  Component Value Date   HGBA1C 5.6 03/21/2019   Stable, pt to continue current medical treatment  - diet

## 2021-03-09 ENCOUNTER — Other Ambulatory Visit (HOSPITAL_COMMUNITY): Payer: Self-pay

## 2021-03-09 NOTE — Progress Notes (Signed)
    Subjective:    CC: L medial elbow pain  I, Molly Weber, LAT, ATC, am serving as scribe for Dr. Clementeen Graham.  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 Pt notes there was a large knot over the medial aspect of his elbow, but that has improved somewhat. Pt locates pain to the medial epicondyle of the L elbow w/ radiating pain into anterior forearm. No numbness/tingling noted.  L elbow swelling: yes- slight, improved Grip strength: normal Aggravating factors: L elbow flexion, wrist flexion Treatments tried: Voltaren gel, Tylenol, IBU  Pertinent review of Systems: No fevers or chills  Relevant historical information: HTN, Sarcoid   Objective:    Vitals:   03/10/21 0808  BP: (!) 150/114  Pulse: 92  SpO2: 98%   General: Well Developed, well nourished, and in no acute distress.   MSK: Left elbow normal-appearing normal motion normal strength.  Tender palpation medial epicondyle. Pain with resisted wrist flexion and wrist pronation. No pain with resisted extension.  Minimal pain with resisted supination.   Lab and Radiology Results  Diagnostic Limited MSK Ultrasound of: left elbow Medial epicondyle visualized.  Common flexor tendon shows small area of hypoechoic change in a deep insertion site concerning for partial-thickness tear.  Mild increased vascular activity on Doppler consistent with inflammation.  No large full-thickness tear present. Impression: Medial epicondylitis with partial tear    Impression and Recommendations:    Assessment and Plan: 55 y.o. male with left elbow pain due to medial epicondylitis with partial tear.  Plan for home exercise program with nitroglycerin patch protocol.  Recheck in 6 weeks return sooner if needed.  Recommend counterforce brace.  Patient works as a Research scientist (life sciences) at work.Marland Kitchen  PDMP not reviewed this encounter. Orders Placed This Encounter  Procedures  . [place] LIMITED JOINT SPACE STRUCTURES UP LEFT(NO LINKED CHARGES)    Standing Status:   Future    Number of Occurrences:   1    Standing Expiration Date:   09/09/2021    Order Specific Question:   Reason for Exam (SYMPTOM  OR DIAGNOSIS REQUIRED)    Answer:   left elbow pain    Order Specific Question:   Preferred imaging location?    Answer:   [facility] ordered this encounter  Medications  . nitroGLYCERIN (NITRODUR - DOSED IN MG/24 HR) 0.2 mg/hr patch    Sig: Apply 1/4 patch daily to tendon for tendonitis.    Dispense:  30 patch    Refill:  1    Discussed warning signs or symptoms. Please see discharge instructions. Patient expresses understanding.   The above documentation has been reviewed and is accurate and complete Clementeen Graham, M.D.

## 2021-03-10 ENCOUNTER — Ambulatory Visit (INDEPENDENT_AMBULATORY_CARE_PROVIDER_SITE_OTHER): Payer: 59 | Admitting: *Deleted

## 2021-03-10 ENCOUNTER — Ambulatory Visit: Payer: 59 | Admitting: Family Medicine

## 2021-03-10 ENCOUNTER — Other Ambulatory Visit (HOSPITAL_COMMUNITY): Payer: Self-pay

## 2021-03-10 ENCOUNTER — Ambulatory Visit: Payer: Self-pay

## 2021-03-10 ENCOUNTER — Other Ambulatory Visit: Payer: Self-pay

## 2021-03-10 VITALS — BP 150/114 | HR 92 | Ht 71.0 in | Wt 155.4 lb

## 2021-03-10 DIAGNOSIS — M7702 Medial epicondylitis, left elbow: Secondary | ICD-10-CM | POA: Diagnosis not present

## 2021-03-10 DIAGNOSIS — J4540 Moderate persistent asthma, uncomplicated: Secondary | ICD-10-CM

## 2021-03-10 DIAGNOSIS — M25522 Pain in left elbow: Secondary | ICD-10-CM

## 2021-03-10 MED ORDER — OMALIZUMAB 150 MG ~~LOC~~ SOLR
375.0000 mg | SUBCUTANEOUS | Status: DC
  Administered 2021-03-10 – 2021-07-13 (×10): 375 mg via SUBCUTANEOUS

## 2021-03-10 MED ORDER — NITROGLYCERIN 0.2 MG/HR TD PT24
MEDICATED_PATCH | TRANSDERMAL | 1 refills | Status: DC
  Filled 2021-03-10: qty 7, 28d supply, fill #0

## 2021-03-10 NOTE — Patient Instructions (Addendum)
Thank you for coming in today.  Please complete the exercises that the athletic trainer went over with you: View at [email or web address] using code: [email or web address]  I think you have golfers elbow.   Use the counterforce brace for tennis elbow or golfers elbow.   Do the exercises we reviewed.   Nitroglycerin Protocol   Apply 1/4 nitroglycerin patch to affected area daily.  Change position of patch within the affected area every 24 hours.  You may experience a headache during the first 1-2 weeks of using the patch, these should subside.  If you experience headaches after beginning nitroglycerin patch treatment, you may take your preferred over the counter pain reliever.  Another side effect of the nitroglycerin patch is skin irritation or rash related to patch adhesive.  Please notify our office if you develop more severe headaches or rash, and stop the patch.  Tendon healing with nitroglycerin patch may require 12 to 24 weeks depending on the extent of injury.  Men should not use if taking Viagra, Cialis, or Levitra.   Do not use if you have migraines or rosacea.   Recheck with me in 1 month.   Ok to continue ibuprofen if you really hurt.

## 2021-03-11 ENCOUNTER — Ambulatory Visit: Payer: Self-pay

## 2021-03-24 ENCOUNTER — Ambulatory Visit (INDEPENDENT_AMBULATORY_CARE_PROVIDER_SITE_OTHER): Payer: 59 | Admitting: *Deleted

## 2021-03-24 ENCOUNTER — Other Ambulatory Visit: Payer: Self-pay

## 2021-03-24 DIAGNOSIS — J4540 Moderate persistent asthma, uncomplicated: Secondary | ICD-10-CM | POA: Diagnosis not present

## 2021-03-31 ENCOUNTER — Other Ambulatory Visit (HOSPITAL_COMMUNITY): Payer: Self-pay

## 2021-03-31 MED FILL — Omalizumab For Inj 150 MG: SUBCUTANEOUS | 28 days supply | Qty: 6 | Fill #3 | Status: AC

## 2021-04-02 ENCOUNTER — Other Ambulatory Visit (HOSPITAL_COMMUNITY): Payer: Self-pay

## 2021-04-03 ENCOUNTER — Other Ambulatory Visit (HOSPITAL_COMMUNITY): Payer: Self-pay

## 2021-04-07 ENCOUNTER — Ambulatory Visit (INDEPENDENT_AMBULATORY_CARE_PROVIDER_SITE_OTHER): Payer: 59 | Admitting: *Deleted

## 2021-04-07 ENCOUNTER — Other Ambulatory Visit: Payer: Self-pay

## 2021-04-07 DIAGNOSIS — J4550 Severe persistent asthma, uncomplicated: Secondary | ICD-10-CM | POA: Diagnosis not present

## 2021-04-09 NOTE — Progress Notes (Signed)
   I, Christoper Fabian, LAT, ATC, am serving as scribe for Dr. Clementeen Graham.  [name] is a 55 y.o. male who presents to [facility] today for f/u of L medial elbow pain.  He was last seen by Dr. Denyse Amass on 03/10/21 and was prescribed nitroglycerin patches and shown a HEP for forearm eccentric strengthening.  Since his last visit, pt reports that his L medial elbow is better, rating his improvement at 80%.  He has been using his nitroglycerin patches intermittently but typically only at night and has been doing his HEP.  He has the biggest issue w/ L forearm pronation and pulling/gripping.   Pertinent review of systems: No fevers or chills  Relevant historical information: Hypertension.  Works as a TEFL teacher.  History of AVN both hips.   Exam:  BP 120/82 (BP Location: Right Arm, Patient Position: Sitting, Cuff Size: Normal)   Pulse 82   Ht 5\' 11"  (1.803 m)   Wt 156 lb 3.2 oz (70.9 kg)   SpO2 96%   BMI 21.79 kg/m  General: Well Developed, well nourished, and in no acute distress.   MSK: Left elbow normal-appearing nontender normal motion.    Lab and Radiology Results No results found for this or any previous visit (from the past 72 hour(s)). No results found.     Assessment and Plan: 55 y.o. male with medial epicondylitis.  Significantly improved with home exercise program and about a month of nitroglycerin patch protocol.  Nitroglycerin patch protocol has somewhat naturally ended and he is doing pretty well.  Plan to continue home exercise program and watchful waiting.  If needed could resume nitroglycerin patch protocol and refer to formal PT.  Recheck as needed.   Discussed warning signs or symptoms. Please see discharge instructions. Patient expresses understanding.   The above documentation has been reviewed and is accurate and complete 44, M.D.  Total encounter time 20 minutes including face-to-face time with the patient and,  reviewing past medical record, and charting on the date of service.   Treatment plan and options

## 2021-04-10 ENCOUNTER — Encounter: Payer: Self-pay | Admitting: Family Medicine

## 2021-04-10 ENCOUNTER — Other Ambulatory Visit: Payer: Self-pay

## 2021-04-10 ENCOUNTER — Other Ambulatory Visit (HOSPITAL_COMMUNITY): Payer: Self-pay

## 2021-04-10 ENCOUNTER — Ambulatory Visit: Payer: 59 | Admitting: Family Medicine

## 2021-04-10 VITALS — BP 120/82 | HR 82 | Ht 71.0 in | Wt 156.2 lb

## 2021-04-10 DIAGNOSIS — M7702 Medial epicondylitis, left elbow: Secondary | ICD-10-CM | POA: Diagnosis not present

## 2021-04-10 NOTE — Patient Instructions (Signed)
Thank you for coming in today.   Continue home exercises.   Ok to use the nitropatches as needed.   This may smolder along for a while.   Let me know if you need anything.   Recheck as needed.

## 2021-04-21 ENCOUNTER — Ambulatory Visit (INDEPENDENT_AMBULATORY_CARE_PROVIDER_SITE_OTHER): Payer: 59

## 2021-04-21 ENCOUNTER — Other Ambulatory Visit: Payer: Self-pay

## 2021-04-21 DIAGNOSIS — J4550 Severe persistent asthma, uncomplicated: Secondary | ICD-10-CM

## 2021-04-29 ENCOUNTER — Other Ambulatory Visit (HOSPITAL_COMMUNITY): Payer: Self-pay

## 2021-04-29 MED FILL — Omalizumab For Inj 150 MG: SUBCUTANEOUS | 28 days supply | Qty: 6 | Fill #4 | Status: AC

## 2021-05-05 ENCOUNTER — Ambulatory Visit (INDEPENDENT_AMBULATORY_CARE_PROVIDER_SITE_OTHER): Payer: 59

## 2021-05-05 ENCOUNTER — Other Ambulatory Visit: Payer: Self-pay

## 2021-05-05 DIAGNOSIS — J4550 Severe persistent asthma, uncomplicated: Secondary | ICD-10-CM | POA: Diagnosis not present

## 2021-05-06 ENCOUNTER — Other Ambulatory Visit (HOSPITAL_COMMUNITY): Payer: Self-pay

## 2021-05-06 MED FILL — Diclofenac Sodium Gel 1% (1.16% Diethylamine Equiv): CUTANEOUS | 18 days supply | Qty: 300 | Fill #0 | Status: AC

## 2021-05-12 IMAGING — MR MR PROSTATE WO/W CM
24 of 56 series · 24 of 56 positions shown · IV contrast (yes)
Comparison: None.

CLINICAL DATA: Elevated PSA, no biopsy

EXAM:
MR PROSTATE WITHOUT AND WITH CONTRAST
TECHNIQUE: Multiplanar multisequence MRI images were obtained of the pelvis
centered about the prostate. Pre and post contrast images were
obtained.
CONTRAST:  8 mL Gadovist IV

[Series 3: bSSFP fat-sat · axial · 6.0mm · 0.86mm/px · 1 of 44 slices shown]
[im 1/44]
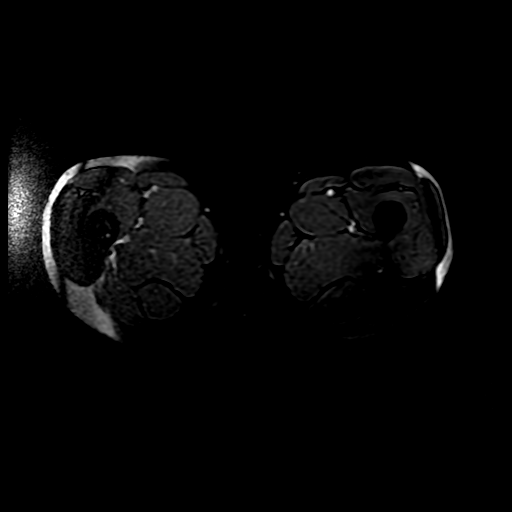

[Series 4: T1 · axial · 6.0mm · 0.86mm/px · 1 of 44 slices shown (1 of 2)]
[im 1/44]
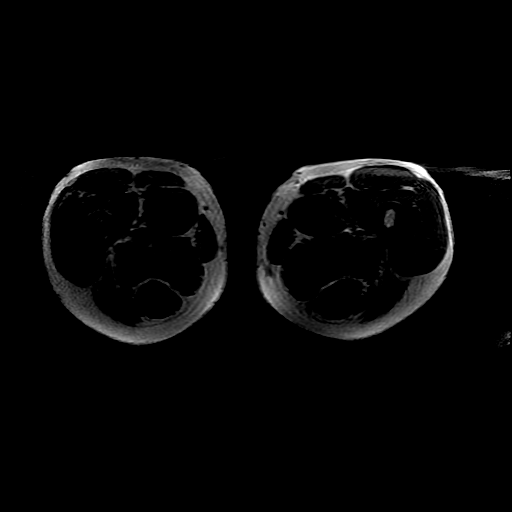

[Series 5: T2 · axial · 3.0mm · 0.29mm/px · 1 of 27 slices shown (1 of 4)]
[im 1/27]
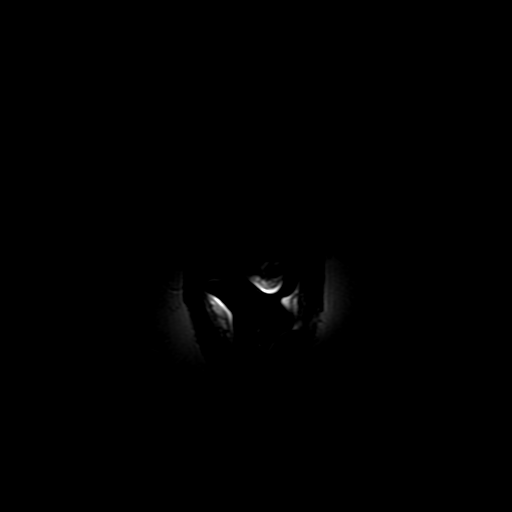

[Series 6: T1 · axial · 3.0mm · 0.29mm/px · 1 of 27 slices shown (2 of 2)]
[im 1/27]
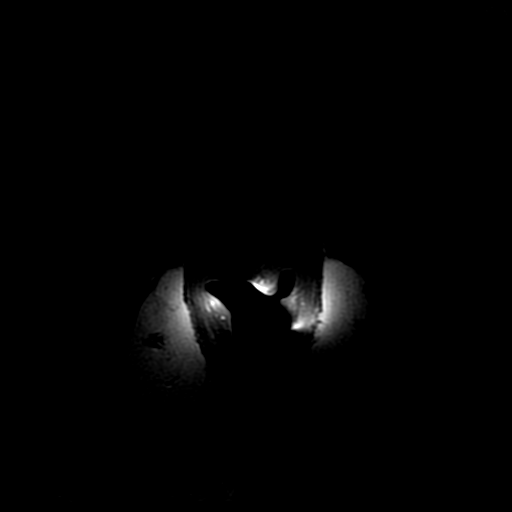

[Series 7: T2 · sagittal · 4.0mm · 0.29mm/px · 1 of 24 slices shown (2 of 4)]
[im 1/24]
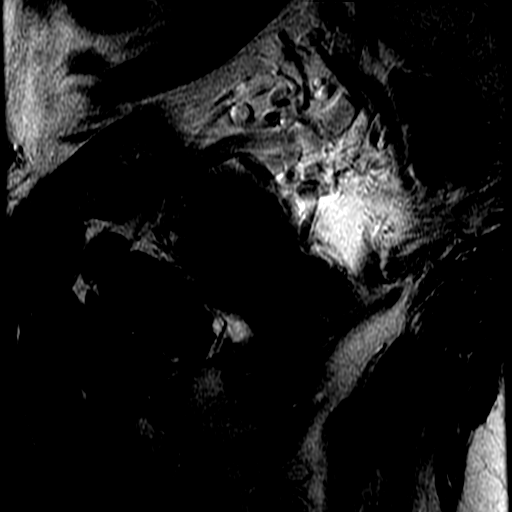

[Series 8: T2 · axial · 1.8mm · 0.47mm/px · 1 of 108 slices shown (3 of 4)]
[im 1/108]
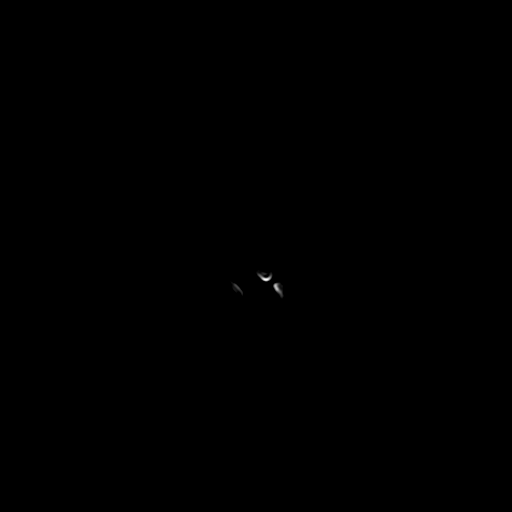

[Series 9: T2 · coronal · 4.0mm · 0.29mm/px · 1 of 24 slices shown (4 of 4)]
[im 1/24]
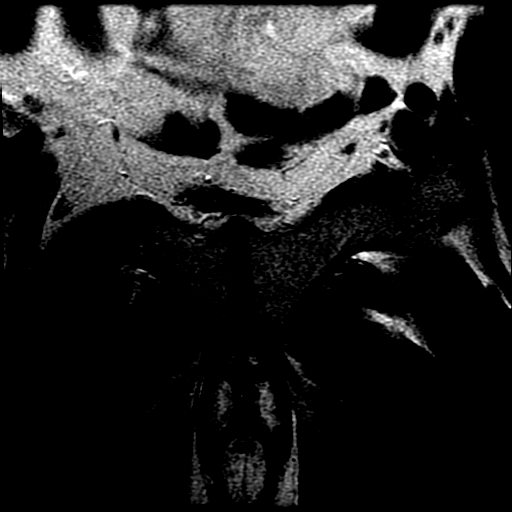

[Series 10: DWI · axial · 3.0mm · 0.59mm/px · 1 of 54 slices shown (1 of 8)]
[im 1/54]
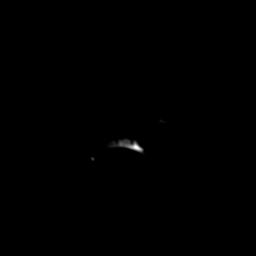

[Series 11: DWI · axial · 3.0mm · 0.59mm/px · 1 of 54 slices shown (2 of 8)]
[im 1/54]
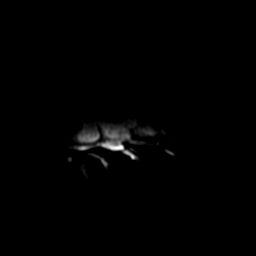

[Series 12: DWI · axial · 3.0mm · 0.59mm/px · 1 of 54 slices shown (3 of 8)]
[im 1/54]
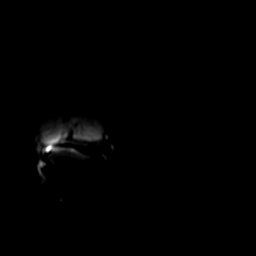

[Series 13: DWI · axial · 3.0mm · 0.59mm/px · 1 of 54 slices shown (4 of 8)]
[im 1/54]
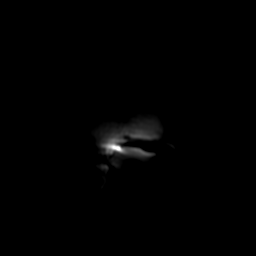

[Series 1000: DWI · axial · 3.0mm · 0.59mm/px · 1 of 27 slices shown (5 of 8)]
[im 1/27]
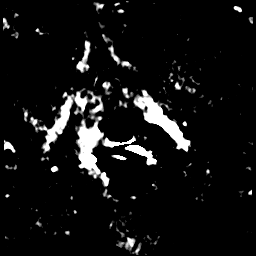

[Series 1100: DWI · axial · 3.0mm · 0.59mm/px · 1 of 27 slices shown (6 of 8)]
[im 1/27]
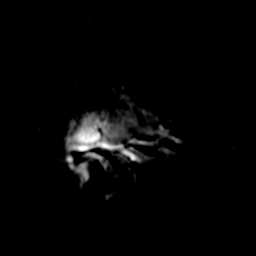

[Series 1200: DWI · axial · 3.0mm · 0.59mm/px · 1 of 27 slices shown (7 of 8)]
[im 1/27]
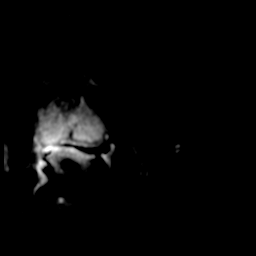

[Series 1300: DWI · axial · 3.0mm · 0.59mm/px · 1 of 27 slices shown (8 of 8)]
[im 1/27]
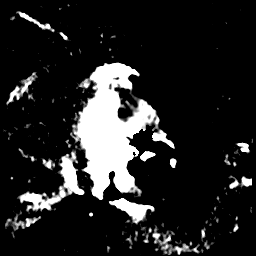

[((id)/(id)/72)-((id)/(id)/72) · axial · 3.0mm · 0.43mm/px · 1 of 72 slices shown (1 of 9)]
[im 1/72]
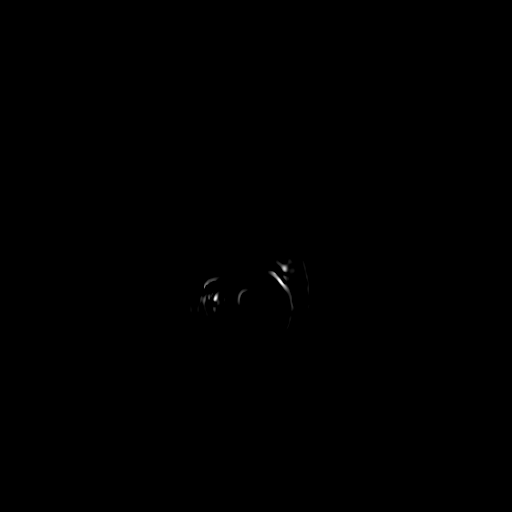

[((id)/(id)/72)-((id)/(id)/72) · axial · 3.0mm · 0.43mm/px · 1 of 72 slices shown (2 of 9)]
[im 1/72]
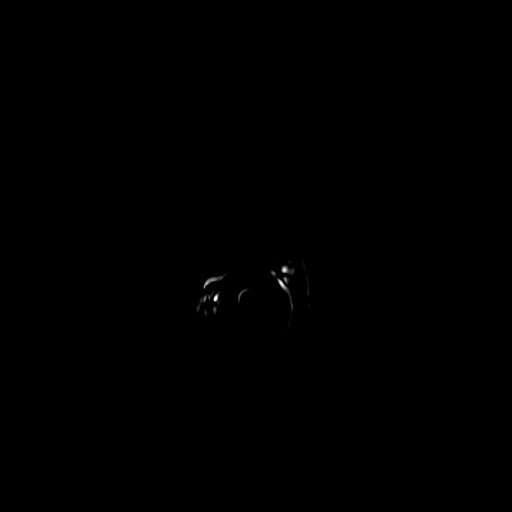

[((id)/(id)/72)-((id)/(id)/72) · axial · 3.0mm · 0.43mm/px · 1 of 72 slices shown (3 of 9)]
[im 1/72]
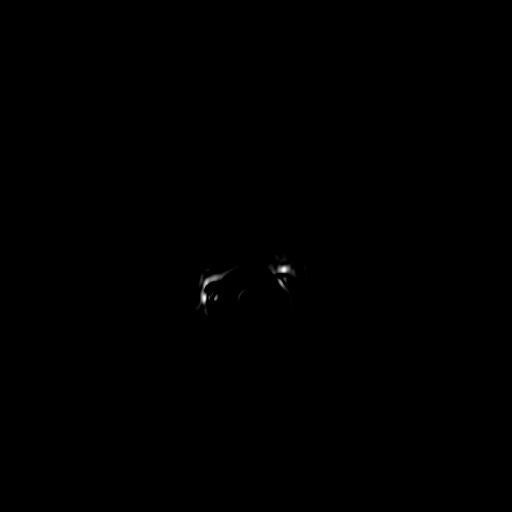

[((id)/(id)/72)-((id)/(id)/72) · axial · 3.0mm · 0.43mm/px · 1 of 72 slices shown (4 of 9)]
[im 1/72]
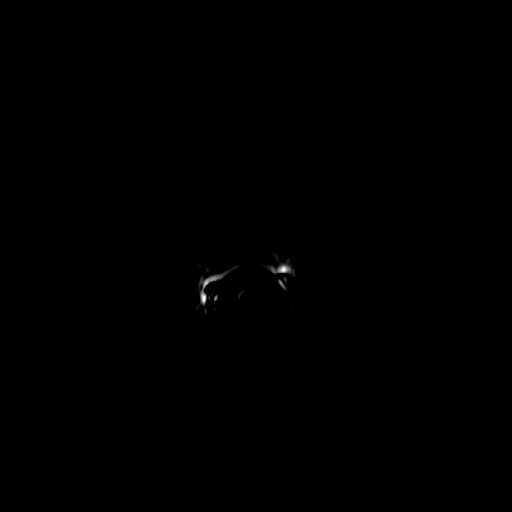

[((id)/(id)/72)-((id)/(id)/72) · axial · 3.0mm · 0.43mm/px · 1 of 72 slices shown (5 of 9)]
[im 1/72]
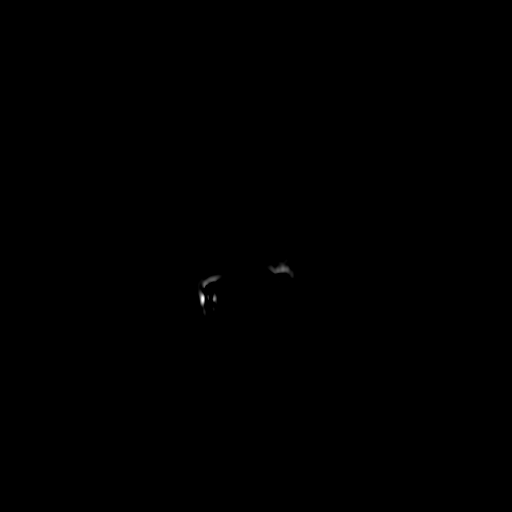

[((id)/(id)/72)-((id)/(id)/72) · axial · 3.0mm · 0.43mm/px · 1 of 72 slices shown (6 of 9)]
[im 1/72]
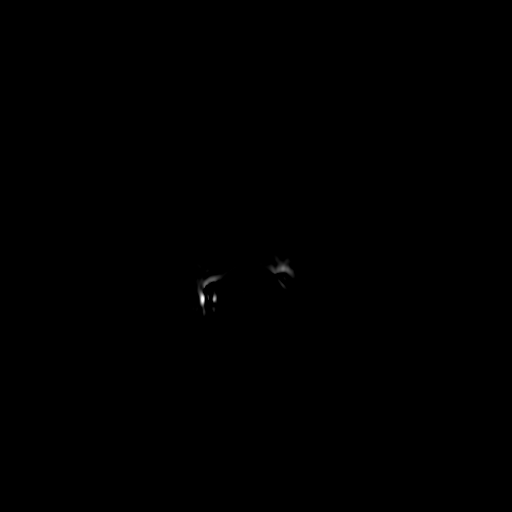

[((id)/(id)/72)-((id)/(id)/72) · axial · 3.0mm · 0.43mm/px · 1 of 72 slices shown (7 of 9)]
[im 1/72]
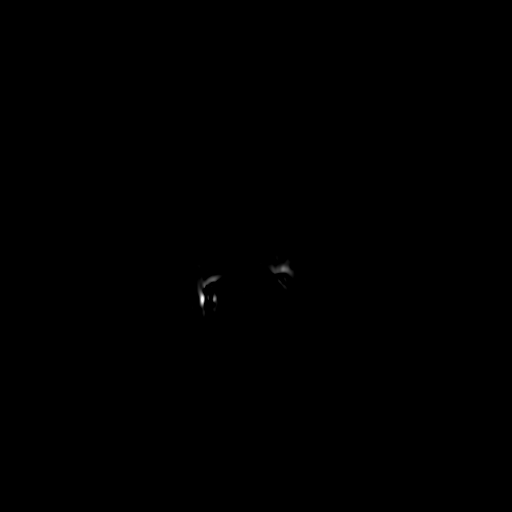

[((id)/(id)/72)-((id)/(id)/72) · axial · 3.0mm · 0.43mm/px · 1 of 72 slices shown (8 of 9)]
[im 1/72]
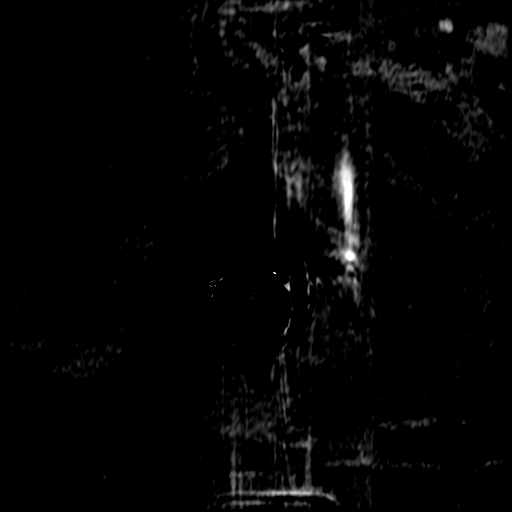

[((id)/(id)/72)-((id)/(id)/72) · axial · 3.0mm · 0.43mm/px · 1 of 72 slices shown (9 of 9)]
[im 1/72]
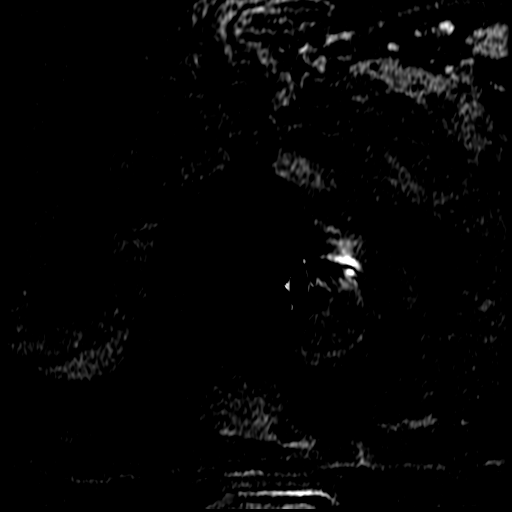

[24 of 56 positions shown; findings below may reference images not displayed]

FINDINGS: Limited evaluation due to patient motion and susceptibility artifact
from the patient's right hip prosthesis. DynaCAD 3D software could
not be used to create a prostate boundary.

Prostate: Mild linear/geographic low T2 signal within the right mid
peripheral zone (series 5/image 16), nonspecific, possibly
reflecting prostatitis. No focal masslike lesion suspicious for
high-grade macroscopic prostate cancer. No restricted diffusion/ADC,
although limited due to susceptibility artifact. No early arterial
enhancement.

Volume: 3.4 x 4.8 x 4.1 cm (volume = 35 mL)

Transcapsular spread:  Absent.

Seminal vesicle involvement: Absent.

Neurovascular bundle involvement: Absent.

Pelvic adenopathy: Absent.

Bone metastasis: Absent. Right hip arthroplasty. Avascular necrosis
of the left femoral head.

Other findings: None.
IMPRESSION: Limited evaluation, as described above.

No findings suspicious for high-grade macroscopic prostate cancer.

Nonspecific linear/geographic low T2 signal within the right mid
peripheral zone, possibly reflecting prostatitis. This does not
correspond to a focal lesion.

## 2021-05-19 ENCOUNTER — Ambulatory Visit (INDEPENDENT_AMBULATORY_CARE_PROVIDER_SITE_OTHER): Payer: 59 | Admitting: *Deleted

## 2021-05-19 ENCOUNTER — Other Ambulatory Visit: Payer: Self-pay

## 2021-05-19 DIAGNOSIS — J4550 Severe persistent asthma, uncomplicated: Secondary | ICD-10-CM

## 2021-05-28 ENCOUNTER — Other Ambulatory Visit (HOSPITAL_COMMUNITY): Payer: Self-pay

## 2021-05-28 MED FILL — Omalizumab For Inj 150 MG: SUBCUTANEOUS | 28 days supply | Qty: 6 | Fill #5 | Status: AC

## 2021-06-02 ENCOUNTER — Other Ambulatory Visit: Payer: Self-pay

## 2021-06-02 ENCOUNTER — Ambulatory Visit (INDEPENDENT_AMBULATORY_CARE_PROVIDER_SITE_OTHER): Payer: 59 | Admitting: *Deleted

## 2021-06-02 DIAGNOSIS — J4550 Severe persistent asthma, uncomplicated: Secondary | ICD-10-CM | POA: Diagnosis not present

## 2021-06-16 ENCOUNTER — Ambulatory Visit (INDEPENDENT_AMBULATORY_CARE_PROVIDER_SITE_OTHER): Payer: 59 | Admitting: *Deleted

## 2021-06-16 ENCOUNTER — Other Ambulatory Visit: Payer: Self-pay

## 2021-06-16 DIAGNOSIS — J4550 Severe persistent asthma, uncomplicated: Secondary | ICD-10-CM

## 2021-06-24 ENCOUNTER — Telehealth: Payer: Self-pay | Admitting: *Deleted

## 2021-06-24 NOTE — Telephone Encounter (Signed)
L/m for patient needs MD appt for Xolair reapproval °

## 2021-06-25 ENCOUNTER — Other Ambulatory Visit (HOSPITAL_COMMUNITY): Payer: Self-pay

## 2021-06-25 ENCOUNTER — Other Ambulatory Visit: Payer: Self-pay | Admitting: Allergy and Immunology

## 2021-06-25 MED ORDER — CARESTART COVID-19 HOME TEST VI KIT
PACK | 0 refills | Status: DC
  Filled 2021-06-25: qty 4, 4d supply, fill #0

## 2021-06-26 ENCOUNTER — Other Ambulatory Visit: Payer: Self-pay | Admitting: Pharmacist

## 2021-06-26 ENCOUNTER — Other Ambulatory Visit (HOSPITAL_COMMUNITY): Payer: Self-pay

## 2021-06-26 ENCOUNTER — Other Ambulatory Visit: Payer: Self-pay | Admitting: Allergy and Immunology

## 2021-06-26 MED ORDER — XOLAIR 150 MG ~~LOC~~ SOLR
SUBCUTANEOUS | 11 refills | Status: DC
  Filled 2021-06-26: qty 6, fill #0

## 2021-06-26 MED ORDER — XOLAIR 150 MG ~~LOC~~ SOLR
SUBCUTANEOUS | 11 refills | Status: DC
  Filled 2021-06-26 – 2021-07-02 (×3): qty 6, 28d supply, fill #0

## 2021-06-28 NOTE — Progress Notes (Signed)
Follow Up Note  RE: [name] MRN: [id number] DOB: [birth year] Date of Office Visit: 06/29/2021  Referring provider: Etta Grandchild, MD Primary care provider: Etta Grandchild, MD  Chief Complaint: Follow-up  History of Present Illness: I had the pleasure of seeing [name] for a follow up visit at the [facility] on 06/29/2021. He is a 55 y.o. male, who is being followed for asthma and allergic rhinitis. His previous allergy office visit was on 08/07/2020 with Dr. Selena Batten. Today is a regular follow up visit.  Severe persistent asthma Waking up at night - about twice a week due to sweating and shortness of breath. Using Symbicort at these times instead of xopenex.  Currently on Symbicort 2 puffs twice a day, Xolair 375mg  every 2 weeks.   Patient had 3 covid-19 vaccines. No prior covid-19 infections.   Stopped Singulair. No recent prednisone use.  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 He is requesting a letter similar to what Dr. wrote in the past - discussed with patient that fortunately the current Covid-19 variants seem to be less fatal than previous variants.  Recommend that he gets the new Covid-19 bivalent vaccine booster.    Perennial and seasonal allergic rhinitis Taking zyrtec at night, not using any nasal sprays and doing well.   Assessment and Plan: [name] is a 55 y.o. male with: Not well controlled severe persistent asthma Not well controlled as having nocturnal awakenings twice per week needing - using Symbicort as rescue.   Today's spirometry showed some mixed restriction and obstruction - worse than previous. Daily controller medication(s): START Trelegy 40 1puff once a day and rinse mouth after each use - this replaces Symbicort for now. Sample given and demonstrated proper use.  Continue Xolair 375mg  every 2 weeks for now -  given today. Get bloodwork to see if patient would qualify for a different biologics.  Restart montelukast (Singulair) 10mg  daily at night. May use levoalbuterol rescue inhaler 2 puffs every 4 to 6 hours as needed for shortness of breath, chest tightness, coughing, and wheezing. May use levoalbuterol rescue inhaler 2 puffs 5 to 15 minutes prior to strenuous physical activities. Monitor frequency of use.  Get spirometry at next visit. Letter written. Recommend getting Covid-19 bivalent booster.   Perennial and seasonal allergic rhinitis Past history - 2019 skin testing was positive to grass, weed, ragweed, trees, mold, dust mites, cockroach, cat and dog.  Interim history - stable. Continue environmental control measures.  Use over the counter antihistamines such as Zyrtec (cetirizine), Claritin (loratadine), Allegra (fexofenadine), or Xyzal (levocetirizine) daily as needed. May take twice a day during allergy flares. May switch antihistamines every few months. May use azelastine nasal spray 1-2 sprays per nostril twice a day as needed for runny nose/drainage. May use Flonase (fluticasone) nasal spray 1 spray per nostril twice a day as needed for nasal congestion.   Return in about 2 months (around 08/29/2021).  Meds ordered this encounter  Medications   Fluticasone-Umeclidin-Vilant (TRELEGY ELLIPTA) 200-62.5-25 MCG/INH AEPB    Sig: Inhale 1 puff into the lungs daily. Rinse mouth after each use.    Dispense:  60 each    Refill:  3   levalbuterol (XOPENEX HFA) 45 MCG/ACT inhaler    Sig: Inhale 2 puffs into the lungs every 4 (four) hours as needed for wheezing or shortness of breath (coughing fits).  Dispense:  1 each    Refill:  1   montelukast (SINGULAIR) 10 MG tablet    Sig: Take 1 tablet (10 mg total) by mouth at bedtime.    Dispense:  30 tablet    Refill:  5    Lab Orders         CBC with Differential/Platelet      Diagnostics: Spirometry:  Tracings reviewed. His effort:  Good reproducible efforts. FVC: 2.43L FEV1: 1.46L, 42% predicted FEV1/FVC ratio: 60% Interpretation: Spirometry consistent with mixed obstructive and restrictive disease.  Worse than previous one.  Please see scanned spirometry results for details.  Medication List:  Current Outpatient Medications  Medication Sig Dispense Refill   albuterol (VENTOLIN HFA) 108 (90 Base) MCG/ACT inhaler Inhale 2 puffs into the lungs every 6 (six) hours as needed for wheezing or shortness of breath.     diclofenac Sodium (VOLTAREN) 1 % GEL APPLY 2 TO 4 GRAMS TO AFFECTED AREA(S) 4 TIMES DAILY 300 g 2   EPINEPHrine 0.3 mg/0.3 mL IJ SOAJ injection Inject 0.3 mg into the muscle as needed for anaphylaxis.   2   Fluticasone-Umeclidin-Vilant (TRELEGY ELLIPTA) 200-62.5-25 MCG/INH AEPB Inhale 1 puff into the lungs daily. Rinse mouth after each use. 60 each 3   ibuprofen (ADVIL) 800 MG tablet TAKE 1 TABLET BY MOUTH EVERY 8 HOURS AS NEEDED 90 tablet 2   levalbuterol (XOPENEX) 0.63 MG/3ML nebulizer solution Take 3 mLs (0.63 mg total) by nebulization every 8 (eight) hours as needed for wheezing or shortness of breath. 150 mL 3   montelukast (SINGULAIR) 10 MG tablet Take 1 tablet (10 mg total) by mouth at bedtime. 30 tablet 5   nebivolol (BYSTOLIC) 5 MG tablet Take 1 tablet (5 mg total) by mouth daily. 90 tablet 1   nitroGLYCERIN (NITRODUR - DOSED IN MG/24 HR) 0.2 mg/hr patch Apply 1/4 patch daily to tendon for tendonitis. 30 patch 1   omalizumab (XOLAIR) 150 MG injection INJECT 375 MG INTO THE SKIN EVERY 14 DAYS. 6 each 11   Azelastine HCl 0.15 % SOLN PLACE 1-2 SPRAYS INTO BOTH NOSTRILS 2 TIMES DAILY AS NEEDED (RUNNY NOSE). (Patient not taking: Reported on 06/29/2021) 30 mL 5   fluticasone (FLONASE) 50 MCG/ACT nasal spray PLACE 1 SPRAY INTO BOTH NOSTRILS IN THE MORNING AND AT BEDTIME. FOR NASAL CONGESTION (Patient not taking: Reported on 06/29/2021) 16 g 5   levalbuterol (XOPENEX HFA) 45 MCG/ACT inhaler Inhale 2 puffs into the  lungs every 4 (four) hours as needed for wheezing or shortness of breath (coughing fits). 1 each 1   Current Facility-Administered Medications  Medication Dose Route Frequency Provider Last Rate Last Admin   omalizumab Geoffry Paradise) injection 375 mg  375 mg Subcutaneous Q14 Days Ellamae Sia, DO   375 mg at 06/16/21 1837   Allergies: No Known Allergies I reviewed his past medical history, social history, family history, and environmental history and no significant changes have been reported from his previous visit.  Review of Systems  Constitutional:  Negative for appetite change, chills, fever and unexpected weight change.  HENT:  Negative for congestion and rhinorrhea.   Eyes:  Negative for itching.  Respiratory:  Positive for shortness of breath. Negative for cough, chest tightness and wheezing.   Gastrointestinal:  Negative for abdominal pain.  Skin:  Negative for rash.  Allergic/Immunologic: Positive for environmental allergies.  Neurological:  Negative for headaches.   Objective: BP 118/78   Pulse 100   Temp 98.1 F (36.7 C) (Temporal)  Resp 18   Ht 5\' 11"  (1.803 m)   Wt 156 lb 4 oz (70.9 kg)   SpO2 97%   BMI 21.79 kg/m  Body mass index is 21.79 kg/m. Physical Exam Vitals and nursing note reviewed.  Constitutional:      Appearance: Normal appearance. He is well-developed.  HENT:     Head: Normocephalic and atraumatic.     Right Ear: Tympanic membrane and external ear normal.     Left Ear: Tympanic membrane and external ear normal.     Nose: Nose normal.     Mouth/Throat:     Mouth: Mucous membranes are moist.     Pharynx: Oropharynx is clear.  Eyes:     Conjunctiva/sclera: Conjunctivae normal.  Cardiovascular:     Rate and Rhythm: Normal rate and regular rhythm.     Heart sounds: Normal heart sounds. No murmur heard. Pulmonary:     Effort: Pulmonary effort is normal.     Breath sounds: Normal breath sounds. No wheezing, rhonchi or rales.  Musculoskeletal:      Cervical back: Neck supple.  Skin:    General: Skin is warm.     Findings: No rash.  Neurological:     Mental Status: He is alert and oriented to person, place, and time.  Psychiatric:        Behavior: Behavior normal.   Previous notes and tests were reviewed. The plan was reviewed with the patient/family, and all questions/concerned were addressed.  It was my pleasure to see [name] today and participate in his care. Please feel free to contact me with any questions or concerns.  Sincerely,  Delila Pereyra, DO Allergy & Immunology  [facility] office: [phone] [facility] office: [phone]

## 2021-06-29 ENCOUNTER — Ambulatory Visit: Payer: 59

## 2021-06-29 ENCOUNTER — Ambulatory Visit: Payer: 59 | Admitting: Allergy

## 2021-06-29 ENCOUNTER — Encounter: Payer: Self-pay | Admitting: Allergy

## 2021-06-29 ENCOUNTER — Other Ambulatory Visit (HOSPITAL_COMMUNITY): Payer: Self-pay

## 2021-06-29 ENCOUNTER — Other Ambulatory Visit: Payer: Self-pay

## 2021-06-29 VITALS — BP 118/78 | HR 100 | Temp 98.1°F | Resp 18 | Ht 71.0 in | Wt 156.2 lb

## 2021-06-29 DIAGNOSIS — J4550 Severe persistent asthma, uncomplicated: Secondary | ICD-10-CM

## 2021-06-29 DIAGNOSIS — J3089 Other allergic rhinitis: Secondary | ICD-10-CM

## 2021-06-29 MED ORDER — MONTELUKAST SODIUM 10 MG PO TABS
10.0000 mg | ORAL_TABLET | Freq: Every day | ORAL | 5 refills | Status: DC
Start: 2021-06-29 — End: 2022-02-10
  Filled 2021-06-29: qty 30, 30d supply, fill #0

## 2021-06-29 MED ORDER — LEVALBUTEROL TARTRATE 45 MCG/ACT IN AERO
2.0000 | INHALATION_SPRAY | RESPIRATORY_TRACT | 1 refills | Status: DC | PRN
  Filled 2021-06-29: qty 15, 16d supply, fill #0

## 2021-06-29 MED ORDER — TRELEGY ELLIPTA 200-62.5-25 MCG/INH IN AEPB
1.0000 | INHALATION_SPRAY | Freq: Every day | RESPIRATORY_TRACT | 3 refills | Status: DC
  Filled 2021-06-29: qty 60, 30d supply, fill #0

## 2021-06-29 NOTE — Assessment & Plan Note (Addendum)
Not well controlled as having nocturnal awakenings twice per week needing - using Symbicort as rescue.    Today's spirometry showed some mixed restriction and obstruction - worse than previous. . Daily controller medication(s): START Trelegy 1puff once a day and rinse mouth after each use - this replaces Symbicort for now. Sample given and demonstrated proper use.  . Continue Xolair 375mg  every 2 weeks for now - given today. o Get bloodwork to see if patient would qualify for a different biologics.  . Restart montelukast (Singulair) 10mg  daily at night. . May use levoalbuterol rescue inhaler 2 puffs every 4 to 6 hours as needed for shortness of breath, chest tightness, coughing, and wheezing. May use levoalbuterol rescue inhaler 2 puffs 5 to 15 minutes prior to strenuous physical activities. Monitor frequency of use.   Get spirometry at next visit.  Letter written.  Recommend getting Covid-19 bivalent booster.

## 2021-06-29 NOTE — Patient Instructions (Addendum)
Asthma: Daily controller medication(s): START Trelegy 1puff once a day and rinse mouth after each use - this replaces Symbicort for now.  Continue Xolair 375mg  every 2 weeks. Read about and Dupixent. Get bloodwork.  Restart montelukast (singulair) 10mg  daily at night. May use levoalbuterol rescue inhaler 2 puffs every 4 to 6 hours as needed for shortness of breath, chest tightness, coughing, and wheezing. May use levoalbuterol rescue inhaler 2 puffs 5 to 15 minutes prior to strenuous physical activities. Monitor frequency of use.  Asthma control goals:  Full participation in all desired activities (may need albuterol before activity) Albuterol use two times or less a week on average (not counting use with activity) Cough interfering with sleep two times or less a month Oral steroids no more than once a year No hospitalizations  Get bloodwork We are ordering labs, so please allow 1-2 weeks for the results to come back. With the newly implemented Cures Act, the labs might be visible to you at the same time that they become visible to me. However, I will not address the results until all of the results are back, so please be patient.   Perennial and seasonal allergic rhinitis Use over the counter antihistamines such as Zyrtec (cetirizine), Claritin (loratadine), Allegra (fexofenadine), or Xyzal (levocetirizine) daily as needed. May take twice a day during allergy flares. May switch antihistamines every few months. May use azelastine nasal spray 1-2 sprays per nostril twice a day as needed for runny nose/drainage. May use Flonase (fluticasone) nasal spray 1 spray per nostril twice a day as needed for nasal congestion.   Follow up in 2 months or sooner if needed.

## 2021-06-29 NOTE — Assessment & Plan Note (Signed)
Past history - 2019 skin testing was positive to grass, weed, ragweed, trees, mold, dust mites, cockroach, cat and dog.  Interim history - stable.  Continue environmental control measures.  . Use over the counter antihistamines such as Zyrtec (cetirizine), Claritin (loratadine), Allegra (fexofenadine), or Xyzal (levocetirizine) daily as needed. May take twice a day during allergy flares. May switch antihistamines every few months.  May use azelastine nasal spray 1-2 sprays per nostril twice a day as needed for runny nose/drainage.  May use Flonase (fluticasone) nasal spray 1 spray per nostril twice a day as needed for nasal congestion.

## 2021-06-30 ENCOUNTER — Other Ambulatory Visit (HOSPITAL_COMMUNITY): Payer: Self-pay

## 2021-06-30 ENCOUNTER — Ambulatory Visit: Payer: 59

## 2021-06-30 ENCOUNTER — Telehealth: Payer: Self-pay | Admitting: *Deleted

## 2021-06-30 LAB — CBC WITH DIFFERENTIAL/PLATELET
Basophils Absolute: 0 10*3/uL (ref 0.0–0.2)
Basos: 1 %
EOS (ABSOLUTE): 0.1 10*3/uL (ref 0.0–0.4)
Eos: 3 %
Hematocrit: 44.6 % (ref 37.5–51.0)
Hemoglobin: 15.5 g/dL (ref 13.0–17.7)
Immature Grans (Abs): 0 10*3/uL (ref 0.0–0.1)
Immature Granulocytes: 0 %
Lymphocytes Absolute: 1.3 10*3/uL (ref 0.7–3.1)
Lymphs: 30 %
MCH: 28.5 pg (ref 26.6–33.0)
MCHC: 34.8 g/dL (ref 31.5–35.7)
MCV: 82 fL (ref 79–97)
Monocytes Absolute: 0.6 10*3/uL (ref 0.1–0.9)
Monocytes: 14 %
Neutrophils Absolute: 2.4 10*3/uL (ref 1.4–7.0)
Neutrophils: 52 %
Platelets: 240 10*3/uL (ref 150–450)
RBC: 5.44 x10E6/uL (ref 4.14–5.80)
RDW: 13.9 % (ref 11.6–15.4)
WBC: 4.4 10*3/uL (ref 3.4–10.8)

## 2021-06-30 NOTE — Telephone Encounter (Signed)
Called patient and advised that letter was ready for pickup. Patient verbalized understanding and stated that his wife is going to pick it up today.

## 2021-06-30 NOTE — Telephone Encounter (Signed)
-----   Message from Ellamae Sia, DO sent at 06/29/2021  6:06 PM EDT ----- Please let patient know that letter is ready for pick up. Thank you.

## 2021-06-30 NOTE — Telephone Encounter (Signed)
Pt's wife picked up letter

## 2021-07-01 ENCOUNTER — Other Ambulatory Visit (HOSPITAL_COMMUNITY): Payer: Self-pay

## 2021-07-02 ENCOUNTER — Other Ambulatory Visit (HOSPITAL_COMMUNITY): Payer: Self-pay

## 2021-07-03 ENCOUNTER — Other Ambulatory Visit (HOSPITAL_COMMUNITY): Payer: Self-pay

## 2021-07-06 ENCOUNTER — Telehealth: Payer: Self-pay | Admitting: *Deleted

## 2021-07-06 ENCOUNTER — Encounter: Payer: Self-pay | Admitting: *Deleted

## 2021-07-06 NOTE — Telephone Encounter (Signed)
-----   Message from Ellamae Sia, DO sent at 07/03/2021 11:18 AM EDT ----- Yes, I would like for him to change to Tezspire if it's approved.  If not then will continue with Xolair.  Thank you.  ----- Message ----- From: Devoria Glassing, CMA Sent: 07/03/2021  10:03 AM EDT To: Ellamae Sia, DO  I had gotten note to re-approve Xolair for patient and read note regarding his asthma issues and possible change to another biologic. Do you want me to try changing over to [facility] or him to continue Xolair until his next visit in Dec. I was waiting before doing the approval to make sure of not change in therapy. Thanks [employer]

## 2021-07-06 NOTE — Telephone Encounter (Signed)
Tried to contact patient no accepting calls. Will send mychart message for him to contact me

## 2021-07-07 NOTE — Telephone Encounter (Signed)
L/m for patient to contact me  

## 2021-07-10 DIAGNOSIS — N402 Nodular prostate without lower urinary tract symptoms: Secondary | ICD-10-CM | POA: Diagnosis not present

## 2021-07-10 LAB — PSA: PSA: 8.13

## 2021-07-13 ENCOUNTER — Other Ambulatory Visit (HOSPITAL_COMMUNITY): Payer: Self-pay

## 2021-07-13 ENCOUNTER — Ambulatory Visit: Payer: 59 | Attending: Internal Medicine | Admitting: Pharmacist

## 2021-07-13 ENCOUNTER — Other Ambulatory Visit: Payer: Self-pay

## 2021-07-13 ENCOUNTER — Ambulatory Visit (INDEPENDENT_AMBULATORY_CARE_PROVIDER_SITE_OTHER): Payer: 59

## 2021-07-13 DIAGNOSIS — J4550 Severe persistent asthma, uncomplicated: Secondary | ICD-10-CM | POA: Diagnosis not present

## 2021-07-13 DIAGNOSIS — Z79899 Other long term (current) drug therapy: Secondary | ICD-10-CM

## 2021-07-13 MED ORDER — TEZSPIRE 210 MG/1.91ML ~~LOC~~ SOSY
210.0000 mg | PREFILLED_SYRINGE | Freq: Once | SUBCUTANEOUS | 11 refills | Status: DC
  Filled 2021-07-13: qty 1.91, 28d supply, fill #0

## 2021-07-13 MED ORDER — TEZSPIRE 210 MG/1.91ML ~~LOC~~ SOSY
210.0000 mg | PREFILLED_SYRINGE | Freq: Once | SUBCUTANEOUS | 11 refills | Status: DC
  Filled 2021-07-13: qty 1.91, 1d supply, fill #0
  Filled 2021-07-22: qty 1.91, 28d supply, fill #0
  Filled 2021-08-14: qty 1.91, 28d supply, fill #1
  Filled 2021-09-14: qty 1.91, 28d supply, fill #2
  Filled 2021-10-12 (×2): qty 1.91, 28d supply, fill #3
  Filled 2021-11-10: qty 1.91, 28d supply, fill #4
  Filled 2021-12-07: qty 1.91, 28d supply, fill #5
  Filled 2021-12-31: qty 1.91, 28d supply, fill #6
  Filled 2022-01-28: qty 1.91, 28d supply, fill #7
  Filled 2022-02-24: qty 1.91, 28d supply, fill #8
  Filled 2022-03-29: qty 1.91, 28d supply, fill #9
  Filled 2022-04-26: qty 1.91, 28d supply, fill #10
  Filled 2022-05-20: qty 1.91, 28d supply, fill #11

## 2021-07-13 NOTE — Progress Notes (Signed)
   S: Patient presents today for review of their specialty medication.   Patient is about to start Tezspire for asthma. Patient is managed by Dr. Selena Batten for this.   Dosing: 210 mg subq q4wks   Adherence: has not yet started   Efficacy: has not yet started   Monitoring:  S/Sx of hypersensitivity: none PFTs: monitored by Dr. Elmyra Ricks office S/sx of infection: none    Current adverse effects: none reported   O:     Lab Results  Component Value Date   WBC 4.4 06/29/2021   HGB 15.5 06/29/2021   HCT 44.6 06/29/2021   MCV 82 06/29/2021   PLT 240 06/29/2021      Chemistry      Component Value Date/Time   NA 140 03/24/2020 1536   K 3.9 03/24/2020 1536   CL 103 03/24/2020 1536   CO2 29 03/24/2020 1536   BUN 15 03/24/2020 1536   CREATININE 1.08 03/24/2020 1536   CREATININE 0.98 04/18/2013 1034      Component Value Date/Time   CALCIUM 9.6 03/24/2020 1536   ALKPHOS 52 03/24/2020 1536   AST 24 03/24/2020 1536   ALT 24 03/24/2020 1536   BILITOT 0.5 03/24/2020 1536       A/P: 1. Medication review: patient is about to start Tezspire for asthma. We discussed the medication including the following: Tezepelumab-ekko is a human monoclonal antibody IgG2? that binds to human thymic stromal lymphopoietin (TSLP), an epithelial cytokine, and prevents human TSLP from interacting with the heterodimeric TSLP receptor. Blocking TSLP with tezepelumab-ekko reduces biomarkers and cytokines associated with inflammation including blood eosinophils, airway submucosal eosinophils, IgE, FeNO, IL-5, and IL-13; however, the mechanism of tezepelumab-ekko action in asthma has not been definitively established. Prior to administration, remove the vial or pre-filled syringe from the refrigerator and allow to reach room temperature for ~60 minutes (do not shake or expose to heat). Administer as a SUBQ injection into the upper arm, thigh, or lower abdomen (avoiding areas within 2 inches of navel). Rotate injection  sites; do not inject into areas where the skin is tender, bruised, erythematous, or hardened. Adverse effects are uncommon but may include antibody development, joint pain, back pain, or pharyngitis. Patients should monitor for hypersensitivity reactions and infections. All questions and concerns were addressed. No recommendations for any changes at this time.   Butch Penny, PharmD, P[place]y [place], CPP Clinical Pharmacist [facility] [phone]

## 2021-07-13 NOTE — Telephone Encounter (Signed)
Noted  

## 2021-07-13 NOTE — Telephone Encounter (Signed)
Spoke to patient about changing to [facility] for better asthma control and patient does want to proceed. Erx sent and copay card details will go to patient. Will reach once delivery set upt ot make appt to start therapy

## 2021-07-17 DIAGNOSIS — R972 Elevated prostate specific antigen [PSA]: Secondary | ICD-10-CM | POA: Diagnosis not present

## 2021-07-17 DIAGNOSIS — R8271 Bacteriuria: Secondary | ICD-10-CM | POA: Diagnosis not present

## 2021-07-17 DIAGNOSIS — N402 Nodular prostate without lower urinary tract symptoms: Secondary | ICD-10-CM | POA: Diagnosis not present

## 2021-07-21 ENCOUNTER — Other Ambulatory Visit (HOSPITAL_COMMUNITY): Payer: Self-pay

## 2021-07-22 ENCOUNTER — Other Ambulatory Visit (HOSPITAL_COMMUNITY): Payer: Self-pay

## 2021-07-23 ENCOUNTER — Other Ambulatory Visit (HOSPITAL_COMMUNITY): Payer: Self-pay

## 2021-07-27 ENCOUNTER — Other Ambulatory Visit (HOSPITAL_COMMUNITY): Payer: Self-pay

## 2021-07-28 ENCOUNTER — Ambulatory Visit (INDEPENDENT_AMBULATORY_CARE_PROVIDER_SITE_OTHER): Payer: 59 | Admitting: *Deleted

## 2021-07-28 ENCOUNTER — Other Ambulatory Visit: Payer: Self-pay

## 2021-07-28 DIAGNOSIS — J4550 Severe persistent asthma, uncomplicated: Secondary | ICD-10-CM

## 2021-07-28 MED ORDER — TEZEPELUMAB-EKKO 210 MG/1.91ML ~~LOC~~ SOSY
210.0000 mg | PREFILLED_SYRINGE | SUBCUTANEOUS | Status: DC
  Administered 2021-07-28 – 2021-08-25 (×2): 210 mg via SUBCUTANEOUS

## 2021-07-28 NOTE — Progress Notes (Signed)
Immunotherapy   Patient Details  Name: [name] MRN: [id number] Date of Birth: 04/14/1966  07/28/2021  [name] started injections for  Tezspire   Frequency: Every 4 weeks  Epi-Pen:Epi-Pen Available  Consent signed and patient instructions given. Patient started Tezspire today and received 1.65mL in the LUA. Patient waited 30 minutes in office, he did experience a slight metallic taste that did reside within his 30 minutes he waited in office. No other symptoms.    [name] 07/28/2021, 6:52 PM

## 2021-08-14 ENCOUNTER — Other Ambulatory Visit (HOSPITAL_COMMUNITY): Payer: Self-pay

## 2021-08-17 ENCOUNTER — Other Ambulatory Visit (HOSPITAL_COMMUNITY): Payer: Self-pay

## 2021-08-19 ENCOUNTER — Other Ambulatory Visit (HOSPITAL_COMMUNITY): Payer: Self-pay

## 2021-08-25 ENCOUNTER — Ambulatory Visit (INDEPENDENT_AMBULATORY_CARE_PROVIDER_SITE_OTHER): Payer: 59

## 2021-08-25 ENCOUNTER — Other Ambulatory Visit: Payer: Self-pay

## 2021-08-25 DIAGNOSIS — J4550 Severe persistent asthma, uncomplicated: Secondary | ICD-10-CM | POA: Diagnosis not present

## 2021-08-26 ENCOUNTER — Encounter: Payer: Self-pay | Admitting: Internal Medicine

## 2021-08-26 ENCOUNTER — Ambulatory Visit (INDEPENDENT_AMBULATORY_CARE_PROVIDER_SITE_OTHER): Payer: 59 | Admitting: Internal Medicine

## 2021-08-26 ENCOUNTER — Other Ambulatory Visit (HOSPITAL_COMMUNITY): Payer: Self-pay

## 2021-08-26 VITALS — BP 136/88 | HR 97 | Temp 98.2°F | Resp 16 | Ht 71.0 in | Wt 152.0 lb

## 2021-08-26 DIAGNOSIS — M48061 Spinal stenosis, lumbar region without neurogenic claudication: Secondary | ICD-10-CM | POA: Diagnosis not present

## 2021-08-26 DIAGNOSIS — G8929 Other chronic pain: Secondary | ICD-10-CM

## 2021-08-26 DIAGNOSIS — Z0000 Encounter for general adult medical examination without abnormal findings: Secondary | ICD-10-CM

## 2021-08-26 DIAGNOSIS — I10 Essential (primary) hypertension: Secondary | ICD-10-CM

## 2021-08-26 DIAGNOSIS — Z23 Encounter for immunization: Secondary | ICD-10-CM | POA: Diagnosis not present

## 2021-08-26 DIAGNOSIS — R972 Elevated prostate specific antigen [PSA]: Secondary | ICD-10-CM | POA: Insufficient documentation

## 2021-08-26 DIAGNOSIS — M25551 Pain in right hip: Secondary | ICD-10-CM

## 2021-08-26 DIAGNOSIS — D869 Sarcoidosis, unspecified: Secondary | ICD-10-CM

## 2021-08-26 LAB — BASIC METABOLIC PANEL
BUN: 20 mg/dL (ref 6–23)
CO2: 30 mEq/L (ref 19–32)
Calcium: 9.3 mg/dL (ref 8.4–10.5)
Chloride: 99 mEq/L (ref 96–112)
Creatinine, Ser: 0.95 mg/dL (ref 0.40–1.50)
GFR: 91.51 mL/min (ref >60.00)
Glucose, Bld: 76 mg/dL (ref 70–99)
Potassium: 3.5 mEq/L (ref 3.5–5.1)
Sodium: 136 mEq/L (ref 135–145)

## 2021-08-26 LAB — HEPATIC FUNCTION PANEL
ALT: 23 U/L (ref 0–53)
AST: 28 U/L (ref 0–37)
Albumin: 4.6 g/dL (ref 3.5–5.2)
Alkaline Phosphatase: 60 U/L (ref 39–117)
Bilirubin, Direct: 0.1 mg/dL (ref 0.0–0.3)
Total Bilirubin: 0.5 mg/dL (ref 0.2–1.2)
Total Protein: 8.3 g/dL (ref 6.0–8.3)

## 2021-08-26 LAB — URINALYSIS, ROUTINE W REFLEX MICROSCOPIC
Bilirubin Urine: NEGATIVE
Hgb urine dipstick: NEGATIVE
Ketones, ur: NEGATIVE
Leukocytes,Ua: NEGATIVE
Nitrite: NEGATIVE
RBC / HPF: NONE SEEN (ref 0–?)
Specific Gravity, Urine: 1.025 (ref 1.000–1.030)
Total Protein, Urine: NEGATIVE
Urine Glucose: NEGATIVE
Urobilinogen, UA: 0.2 (ref 0.0–1.0)
WBC, UA: NONE SEEN (ref 0–?)
pH: 6 (ref 5.0–8.0)

## 2021-08-26 LAB — LIPID PANEL
Cholesterol: 204 mg/dL — ABNORMAL HIGH (ref 0–200)
HDL: 60.7 mg/dL (ref >39.00)
LDL Cholesterol: 123 mg/dL — ABNORMAL HIGH (ref 0–99)
NonHDL: 143.64
Total CHOL/HDL Ratio: 3
Triglycerides: 101 mg/dL (ref 0.0–149.0)
VLDL: 20.2 mg/dL (ref 0.0–40.0)

## 2021-08-26 LAB — TSH: TSH: 1.88 u[IU]/mL (ref 0.35–5.50)

## 2021-08-26 MED ORDER — IBUPROFEN 800 MG PO TABS
ORAL_TABLET | Freq: Three times a day (TID) | ORAL | 2 refills | Status: DC | PRN
  Filled 2021-08-26 – 2021-10-12 (×3): qty 90, 30d supply, fill #0

## 2021-08-26 MED ORDER — NEBIVOLOL HCL 5 MG PO TABS
5.0000 mg | ORAL_TABLET | Freq: Every day | ORAL | 1 refills | Status: DC
  Filled 2021-08-26 – 2021-10-12 (×3): qty 90, 90d supply, fill #0

## 2021-08-26 NOTE — Progress Notes (Signed)
Subjective:  Patient ID: [name], male    DOB: 1966-09-14  Age: 55 y.o. MRN: [id number]  CC: Annual Exam, Hypertension, and Back Pain  This visit occurred during the SARS-CoV-2 public health emergency.  Safety protocols were in place, including screening questions prior to the visit, additional usage of staff PPE, and extensive cleaning of exam room while observing appropriate contact time as indicated for disinfecting solutions.    HPI -   [name] presents for a CPX and f/up -  His asthma symptoms have recently improved with a new agent.  He has mild wheezing and shortness of breath but denies chest pain, hemoptysis, dyspnea on exertion, edema, or fatigue.  Outpatient Medications Prior to Visit  Medication Sig Dispense Refill   albuterol (VENTOLIN HFA) 108 (90 Base) MCG/ACT inhaler Inhale 2 puffs into the lungs every 6 (six) hours as needed for wheezing or shortness of breath.     diclofenac Sodium (VOLTAREN) 1 % GEL APPLY 2 TO 4 GRAMS TO AFFECTED AREA(S) 4 TIMES DAILY 300 g 2   EPINEPHrine 0.3 mg/0.3 mL IJ SOAJ injection Inject 0.3 mg into the muscle as needed for anaphylaxis.   2   Fluticasone-Umeclidin-Vilant (TRELEGY ELLIPTA) 200-62.5-25 MCG/INH AEPB Inhale 1 puff into the lungs daily. Rinse mouth after each use. 60 each 3   levalbuterol (XOPENEX HFA) 45 MCG/ACT inhaler Inhale 2 puffs into the lungs every 4 (four) hours as needed for wheezing or shortness of breath (coughing fits). 15 g 1   levalbuterol (XOPENEX) 0.63 MG/3ML nebulizer solution Take 3 mLs (0.63 mg total) by nebulization every 8 (eight) hours as needed for wheezing or shortness of breath. 150 mL 3   montelukast (SINGULAIR) 10 MG tablet Take 1 tablet (10 mg total) by mouth at bedtime. 30 tablet 5   nitroGLYCERIN (NITRODUR - DOSED IN MG/24 HR) 0.2 mg/hr patch Apply 1/4 patch daily to tendon for tendonitis. 30 patch 1   tezepelumab-ekko (TEZSPIRE) 210 MG/1. syringe Inject 1.91 mLs (210 mg total) into the  skin once for 1 dose. 1.91 mL 11   ibuprofen (ADVIL) 800 MG tablet TAKE 1 TABLET BY MOUTH EVERY 8 HOURS AS NEEDED 90 tablet 2   nebivolol (BYSTOLIC) 5 MG tablet Take 1 tablet (5 mg total) by mouth daily. 90 tablet 1   Azelastine HCl 0.15 % SOLN PLACE 1-2 SPRAYS INTO BOTH NOSTRILS 2 TIMES DAILY AS NEEDED (RUNNY NOSE). (Patient not taking: Reported on 06/29/2021) 30 mL 5   fluticasone (FLONASE) 50 MCG/ACT nasal spray PLACE 1 SPRAY INTO BOTH NOSTRILS IN THE MORNING AND AT BEDTIME. FOR NASAL CONGESTION (Patient not taking: Reported on 06/29/2021) 16 g 5   omalizumab (XOLAIR) injection 375 mg      tezepelumab-ekko (TEZSPIRE) 210 MG/1. syringe 210 mg      No facility-administered medications prior to visit.    ROS Review of Systems  Constitutional: Negative.  Negative for diaphoresis, fatigue and unexpected weight change.  HENT: Negative.  Negative for sore throat.   Eyes: Negative.   Respiratory:  Positive for shortness of breath and wheezing. Negative for cough and chest tightness.   Cardiovascular:  Negative for chest pain, palpitations and leg swelling.  Gastrointestinal:  Negative for abdominal pain, blood in stool, constipation, diarrhea and vomiting.  Genitourinary: Negative.  Negative for difficulty urinating, dysuria, scrotal swelling, testicular pain and urgency.  Musculoskeletal:  Positive for arthralgias and back pain. Negative for myalgias and neck pain.  Skin: Negative.  Negative for color change and pallor.  Neurological: Negative.  Negative for dizziness, weakness, light-headedness and headaches.  Hematological:  Negative for adenopathy. Does not bruise/bleed easily.  Psychiatric/Behavioral: Negative.     Objective:  BP 136/88 (BP Location: Left Arm, Patient Position: Sitting, Cuff Size: Large)   Pulse 97   Temp 98.2 F (36.8 C) (Oral)   Resp 16   Ht 5\' 11"  (1.803 m)   Wt 152 lb (68.9 kg)   SpO2 96%   BMI 21.20 kg/m   BP Readings from Last 3 Encounters:  08/26/21  136/88  06/29/21 118/78  04/10/21 120/82    Wt Readings from Last 3 Encounters:  08/26/21 152 lb (68.9 kg)  06/29/21 156 lb 4 oz (70.9 kg)  04/10/21 156 lb 3.2 oz (70.9 kg)    Physical Exam Vitals reviewed.  HENT:     Nose: Nose normal.     Mouth/Throat:     Mouth: Mucous membranes are moist.  Eyes:     General: No scleral icterus.    Conjunctiva/sclera: Conjunctivae normal.  Cardiovascular:     Rate and Rhythm: Normal rate.     Heart sounds: No murmur heard. Pulmonary:     Effort: Pulmonary effort is normal.     Breath sounds: No stridor. No wheezing, rhonchi or rales.  Abdominal:     General: Abdomen is flat.     Palpations: There is no mass.     Tenderness: There is no abdominal tenderness. There is no guarding.     Hernia: No hernia is present. There is no hernia in the left inguinal area or right inguinal area.  Genitourinary:    Pubic Area: No rash.      Penis: Normal and circumcised.      Testes: Normal.     Epididymis:     Right: Normal. Not inflamed or enlarged. No mass.     Left: Normal. Not inflamed or enlarged. No mass.     Prostate: Enlarged. No nodules present.     Rectum: Normal. Guaiac result negative. No mass, tenderness, anal fissure, external hemorrhoid or internal hemorrhoid. Normal anal tone.  Musculoskeletal:        General: Normal range of motion.     Cervical back: Neck supple.     Right lower leg: No edema.     Left lower leg: No edema.  Lymphadenopathy:     Cervical: No cervical adenopathy.     Lower Body: No right inguinal adenopathy. No left inguinal adenopathy.  Skin:    General: Skin is warm and dry.  Neurological:     General: No focal deficit present.     Mental Status: He is alert.  Psychiatric:        Mood and Affect: Mood normal.        Behavior: Behavior normal.    Lab Results  Component Value Date   WBC 4.4 06/29/2021   HGB 15.5 06/29/2021   HCT 44.6 06/29/2021   PLT 240 06/29/2021   GLUCOSE 76 08/26/2021   CHOL 204  (H) 08/26/2021   TRIG 101.0 08/26/2021   HDL 60.70 08/26/2021   LDLDIRECT 113.0 03/21/2019   LDLCALC 123 (H) 08/26/2021   ALT 23 08/26/2021   AST 28 08/26/2021   NA 136 08/26/2021   K 3.5 08/26/2021   CL 99 08/26/2021   CREATININE 0.95 08/26/2021   BUN 20 08/26/2021   CO2 30 08/26/2021   TSH 1.88 08/26/2021   PSA 8.13 07/10/2021   HGBA1C 5.6 03/21/2019    No results found.  Assessment & Plan:   [place] was seen today for annual exam, hypertension and back pain.  Diagnoses and all orders for this visit:  Sarcoidosis (HCC)- His calcium level is normal.  No complications noted. -     Hepatic function panel; Future -     Basic metabolic panel; Future -     Basic metabolic panel -     Hepatic function panel  Essential hypertension, benign- He has not achieved his blood pressure goal of 130/80.  Will restart nebivolol. -     TSH; Future -     Urinalysis, Routine w reflex microscopic; Future -     Hepatic function panel; Future -     Basic metabolic panel; Future -     nebivolol (BYSTOLIC) 5 MG tablet; Take 1 tablet (5 mg total) by mouth daily. -     Basic metabolic panel -     Hepatic function panel -     Urinalysis, Routine w reflex microscopic -     TSH  Chronic right hip pain  Spinal stenosis at L4-L5 level -     ibuprofen (ADVIL) 800 MG tablet; TAKE 1 TABLET BY MOUTH EVERY 8 HOURS AS NEEDED  Routine general medical examination at a health care facility- Exam completed, labs reviewed, vaccines reviewed and updated, patient education was given. -     Lipid panel; Future -     Cancel: PSA; Future -     Lipid panel  Need for vaccination -     Pneumococcal conjugate vaccine 20-valent  PSA elevation- This is being followed by urology.  I am having [name] maintain his EPINEPHrine, levalbuterol, albuterol, diclofenac Sodium, fluticasone, Azelastine HCl, nitroGLYCERIN, Trelegy Ellipta, levalbuterol, montelukast, Tezspire, nebivolol, and ibuprofen. We will stop  administering omalizumab and tezepelumab-ekko.  Meds ordered this encounter  Medications   nebivolol (BYSTOLIC) 5 MG tablet    Sig: Take 1 tablet (5 mg total) by mouth daily.    Dispense:  90 tablet    Refill:  1   ibuprofen (ADVIL) 800 MG tablet    Sig: TAKE 1 TABLET BY MOUTH EVERY 8 HOURS AS NEEDED    Dispense:  90 tablet    Refill:  2      Follow-up: Return in about 6 months (around 02/23/2022).  Sanda Linger, MD

## 2021-08-26 NOTE — Patient Instructions (Signed)

## 2021-09-03 ENCOUNTER — Other Ambulatory Visit (HOSPITAL_COMMUNITY): Payer: Self-pay

## 2021-09-07 ENCOUNTER — Other Ambulatory Visit (HOSPITAL_COMMUNITY): Payer: Self-pay

## 2021-09-08 DIAGNOSIS — N402 Nodular prostate without lower urinary tract symptoms: Secondary | ICD-10-CM | POA: Diagnosis not present

## 2021-09-14 ENCOUNTER — Other Ambulatory Visit (HOSPITAL_COMMUNITY): Payer: Self-pay

## 2021-09-15 ENCOUNTER — Other Ambulatory Visit (HOSPITAL_COMMUNITY): Payer: Self-pay

## 2021-09-17 ENCOUNTER — Other Ambulatory Visit (HOSPITAL_COMMUNITY): Payer: Self-pay

## 2021-09-19 DIAGNOSIS — H524 Presbyopia: Secondary | ICD-10-CM | POA: Diagnosis not present

## 2021-09-19 DIAGNOSIS — H5201 Hypermetropia, right eye: Secondary | ICD-10-CM | POA: Diagnosis not present

## 2021-09-19 DIAGNOSIS — H52223 Regular astigmatism, bilateral: Secondary | ICD-10-CM | POA: Diagnosis not present

## 2021-09-22 ENCOUNTER — Ambulatory Visit: Payer: 59

## 2021-09-23 ENCOUNTER — Encounter: Payer: Self-pay | Admitting: Allergy

## 2021-09-23 ENCOUNTER — Other Ambulatory Visit: Payer: Self-pay

## 2021-09-23 ENCOUNTER — Telehealth: Payer: Self-pay | Admitting: Internal Medicine

## 2021-09-23 ENCOUNTER — Ambulatory Visit (INDEPENDENT_AMBULATORY_CARE_PROVIDER_SITE_OTHER): Payer: 59

## 2021-09-23 ENCOUNTER — Ambulatory Visit: Payer: 59 | Admitting: Allergy

## 2021-09-23 VITALS — BP 132/84 | HR 106 | Temp 98.7°F | Resp 18 | Ht 71.0 in | Wt 155.2 lb

## 2021-09-23 DIAGNOSIS — J4550 Severe persistent asthma, uncomplicated: Secondary | ICD-10-CM

## 2021-09-23 DIAGNOSIS — J3089 Other allergic rhinitis: Secondary | ICD-10-CM | POA: Diagnosis not present

## 2021-09-23 DIAGNOSIS — H9201 Otalgia, right ear: Secondary | ICD-10-CM

## 2021-09-23 MED ORDER — TEZEPELUMAB-EKKO 210 MG/1.91ML ~~LOC~~ SOSY
210.0000 mg | PREFILLED_SYRINGE | SUBCUTANEOUS | Status: DC
  Administered 2021-09-23 – 2022-05-31 (×11): 210 mg via SUBCUTANEOUS

## 2021-09-23 NOTE — Telephone Encounter (Signed)
Patient requesting a call back to discuss otc medication recommendations until ov on 12-27  *see below*

## 2021-09-23 NOTE — Telephone Encounter (Signed)
Patient calling in  Says he has been having some left ear ache pain for about 3 days which he believes is a possible ear infection  Wants to know if provider can send something over to pharmacy  Please call patient [phone]

## 2021-09-23 NOTE — Telephone Encounter (Signed)
Pt has been informed to speak with his pharmacist in regards to OTC recommendations as we are not familiar with OTC products that may be available for his ear issue. He expressed understanding.

## 2021-09-23 NOTE — Patient Instructions (Addendum)
Asthma: Daily controller medication(s): Trelegy 1puff once a day and rinse mouth after each use.  Continue Tezspire injection every 4 weeks - results given.  Restart montelukast (singulair) 10mg  daily at night. May use levoalbuterol rescue inhaler 2 puffs every 4 to 6 hours as needed for shortness of breath, chest tightness, coughing, and wheezing. May use levoalbuterol rescue inhaler 2 puffs 5 to 15 minutes prior to strenuous physical activities. Monitor frequency of use.  Asthma control goals:  Full participation in all desired activities (may need albuterol before activity) Albuterol use two times or less a week on average (not counting use with activity) Cough interfering with sleep two times or less a month Oral steroids no more than once a year No hospitalizations  Perennial and seasonal allergic rhinitis Use over the counter antihistamines such as Zyrtec (cetirizine), Claritin (loratadine), Allegra (fexofenadine), or Xyzal (levocetirizine) daily as needed. May take twice a day during allergy flares. May switch antihistamines every few months. May use azelastine nasal spray 1-2 sprays per nostril twice a day as needed for runny nose/drainage. May use Flonase (fluticasone) nasal spray 1 spray per nostril twice a day as needed for nasal congestion.   Ear pain No redness, fluid in the right ear. May take tylenol or ibuprofen for the ear pain. If  not feeling better then go see your PCP next week.   Follow up in 4 months or sooner if needed.  Okay to come back next week for bivalent covid-19 booster.

## 2021-09-23 NOTE — Assessment & Plan Note (Signed)
Doing much better with below regimen and not using rescue inhaler.    Today's spirometry showed some mixed restriction and obstruction - slightly improved from previous one.   Daily controller medication(s): Trelegy 1puff once a day and rinse mouth after each use.   Continue Tezspire injection every 4 weeks - results given.   Restart montelukast (singulair) 10mg  daily at night.  May use levoalbuterol rescue inhaler 2 puffs every 4 to 6 hours as needed for shortness of breath, chest tightness, coughing, and wheezing. May use levoalbuterol rescue inhaler 2 puffs 5 to 15 minutes prior to strenuous physical activities. Monitor frequency of use.   Recommend getting Covid-19 bivalent booster.   Get spirometry at next visit.

## 2021-09-23 NOTE — Assessment & Plan Note (Addendum)
Right ear pain x few days. Normal exam today. Denies teeth pain or TMJ issues.  May take tylenol or ibuprofen for the ear pain prn.   If not feeling better then go see PCP next week - already has appointment.

## 2021-09-23 NOTE — Progress Notes (Signed)
Follow Up Note  RE: [name] MRN: [id number] DOB: 03/25/1966 Date of Office Visit: 09/23/2021  Referring provider: Etta Grandchild, MD Primary care provider: Etta Grandchild, MD  Chief Complaint: Asthma (Doing good - no problems)  History of Present Illness: I had the pleasure of seeing [name] for a [facility]. He is a 55 y.o. male, who is being followed for asthma on Tezspire and allergic rhinitis. His previous allergy office visit was on 06/29/2021 with Dr. Selena Batten. Today is a regular follow up visit.  Asthma  Patient had his third Tezspire injection today with no issues. After his first injection he complained of some weird taste in his mouth but no issues subsequently.   Currently on Trelegy 1 puff once a day and this definitely works better than Symbicort.  No rescue inhaler use since the last visit. He hasn't been waking up at night due to coughing, wheezing or night sweats.   Stopped Singulair about 1 week ago and not sure if it made a difference.   Denies any SOB, coughing, wheezing, chest tightness, nocturnal awakenings, ER/urgent care visits or prednisone use since the last visit.   Perennial and seasonal allergic rhinitis Asymptomatic and not using any nasal sprays.  Noted some right ear pain the last few days.  Assessment and Plan: Itay is a 55 y.o. male with: Severe persistent asthma without complication Doing much better with below regimen and not using rescue inhaler.   Today's spirometry showed some mixed restriction and obstruction - slightly improved from previous one.  Daily controller medication(s): Trelegy 1puff once a day and rinse mouth after each use.  Continue Tezspire injection every 4 weeks - results given.  Restart montelukast (singulair) 10mg  daily at night. May use levoalbuterol rescue inhaler 2 puffs every 4 to 6 hours as needed for shortness of breath, chest  tightness, coughing, and wheezing. May use levoalbuterol rescue inhaler 2 puffs 5 to 15 minutes prior to strenuous physical activities. Monitor frequency of use.  Recommend getting Covid-19 bivalent booster.  Get spirometry at next visit.  Perennial and seasonal allergic rhinitis Past history - 2019 skin testing was positive to grass, weed, ragweed, trees, mold, dust mites, cockroach, cat and dog.  Interim history - asymptomatic.  Continue environmental control measures.  Use over the counter antihistamines such as Zyrtec (cetirizine), Claritin (loratadine), Allegra (fexofenadine), or Xyzal (levocetirizine) daily as needed. May take twice a day during allergy flares. May switch antihistamines every few months. May use azelastine nasal spray 1-2 sprays per nostril twice a day as needed for runny nose/drainage. May use Flonase (fluticasone) nasal spray 1 spray per nostril twice a day as needed for nasal congestion.   Right ear pain Right ear pain x few days. Normal exam today. Denies teeth pain or TMJ issues. May take tylenol or ibuprofen for the ear pain prn.  If not feeling better then go see PCP next week - already has appointment.   Return in about 4 months (around 01/22/2022).  No orders of the defined types were placed in this encounter.  Lab Orders  No laboratory test(s) ordered today    Diagnostics: Spirometry:  Tracings reviewed. His effort: Good reproducible efforts. FVC: 2.63L FEV1: 1.56L, 46% predicted FEV1/FVC ratio: 59% Interpretation: Spirometry consistent with mixed obstructive and restrictive disease.  Please see scanned spirometry results for details.  Medication List:  Current Outpatient Medications  Medication Sig Dispense Refill  albuterol (VENTOLIN HFA) 108 (90 Base) MCG/ACT inhaler Inhale 2 puffs into the lungs every 6 (six) hours as needed for wheezing or shortness of breath.     diclofenac Sodium (VOLTAREN) 1 % GEL APPLY 2 TO 4 GRAMS TO AFFECTED AREA(S) 4  TIMES DAILY 300 g 2   EPINEPHrine 0.3 mg/0.3 mL IJ SOAJ injection Inject 0.3 mg into the muscle as needed for anaphylaxis.   2   Fluticasone-Umeclidin-Vilant (TRELEGY ELLIPTA) 200-62.5-25 MCG/INH AEPB Inhale 1 puff into the lungs daily. Rinse mouth after each use. 60 each 3   ibuprofen (ADVIL) 800 MG tablet TAKE 1 TABLET BY MOUTH EVERY 8 HOURS AS NEEDED 90 tablet 2   levalbuterol (XOPENEX HFA) 45 MCG/ACT inhaler Inhale 2 puffs into the lungs every 4 (four) hours as needed for wheezing or shortness of breath (coughing fits). 15 g 1   levalbuterol (XOPENEX) 0.63 MG/3ML nebulizer solution Take 3 mLs (0.63 mg total) by nebulization every 8 (eight) hours as needed for wheezing or shortness of breath. 150 mL 3   montelukast (SINGULAIR) 10 MG tablet Take 1 tablet (10 mg total) by mouth at bedtime. 30 tablet 5   nebivolol (BYSTOLIC) 5 MG tablet Take 1 tablet (5 mg total) by mouth daily. 90 tablet 1   nitroGLYCERIN (NITRODUR - DOSED IN MG/24 HR) 0.2 mg/hr patch Apply 1/4 patch daily to tendon for tendonitis. 30 patch 1   tezepelumab-ekko (TEZSPIRE) 210 MG/1. syringe Inject 1.91 mLs (210 mg total) into the skin once for 1 dose. 1.91 mL 11   Azelastine HCl 0.15 % SOLN PLACE 1-2 SPRAYS INTO BOTH NOSTRILS 2 TIMES DAILY AS NEEDED (RUNNY NOSE). (Patient not taking: Reported on 06/29/2021) 30 mL 5   fluticasone (FLONASE) 50 MCG/ACT nasal spray PLACE 1 SPRAY INTO BOTH NOSTRILS IN THE MORNING AND AT BEDTIME. FOR NASAL CONGESTION (Patient not taking: Reported on 06/29/2021) 16 g 5   Current Facility-Administered Medications  Medication Dose Route Frequency Provider Last Rate Last Admin   tezepelumab-ekko (TEZSPIRE) 210 MG/1. syringe 210 mg  210 mg Subcutaneous Q28 days Ellamae Sia, DO   210 mg at 09/23/21 1545   Allergies: No Known Allergies I reviewed his past medical history, social history, family history, and environmental history and no significant changes have been reported from his previous  visit.  Review of Systems  Constitutional:  Negative for appetite change, chills, fever and unexpected weight change.  HENT:  Positive for ear pain. Negative for congestion and rhinorrhea.   Eyes:  Negative for itching.  Respiratory:  Negative for cough, chest tightness, shortness of breath and wheezing.   Gastrointestinal:  Negative for abdominal pain.  Skin:  Negative for rash.  Allergic/Immunologic: Positive for environmental allergies.  Neurological:  Negative for headaches.   Objective: BP 132/84    Pulse (!) 106    Temp 98.7 F (37.1 C)    Resp 18    Ht 5\' 11"  (1.803 m)    Wt 155 lb 3.2 oz (70.4 kg)    SpO2 96%    BMI 21.65 kg/m  Body mass index is 21.65 kg/m. Physical Exam Vitals and nursing note reviewed.  Constitutional:      Appearance: Normal appearance. He is well-developed.  HENT:     Head: Normocephalic and atraumatic.     Right Ear: Tympanic membrane and external ear normal.     Left Ear: Tympanic membrane and external ear normal.     Nose: Nose normal.     Mouth/Throat:  Mouth: Mucous membranes are moist.     Pharynx: Oropharynx is clear.  Eyes:     Conjunctiva/sclera: Conjunctivae normal.  Cardiovascular:     Rate and Rhythm: Normal rate and regular rhythm.     Heart sounds: Normal heart sounds. No murmur heard. Pulmonary:     Effort: Pulmonary effort is normal.     Breath sounds: Normal breath sounds. No wheezing, rhonchi or rales.  Musculoskeletal:     Cervical back: Neck supple.  Skin:    General: Skin is warm.     Findings: No rash.  Neurological:     Mental Status: He is alert and oriented to person, place, and time.  Psychiatric:        Behavior: Behavior normal.   Previous notes and tests were reviewed. The plan was reviewed with the patient/family, and all questions/concerned were addressed.  It was my pleasure to see Tremayne today and participate in his care. Please feel free to contact me with any questions or  concerns.  Sincerely,  Wyline Mood, DO Allergy & Immunology  [facility] office: [phone] [facility] office: [phone]

## 2021-09-23 NOTE — Assessment & Plan Note (Signed)
Past history - 2019 skin testing was positive to grass, weed, ragweed, trees, mold, dust mites, cockroach, cat and dog.  Interim history - asymptomatic.   Continue environmental control measures.   Use over the counter antihistamines such as Zyrtec (cetirizine), Claritin (loratadine), Allegra (fexofenadine), or Xyzal (levocetirizine) daily as needed. May take twice a day during allergy flares. May switch antihistamines every few months.  May use azelastine nasal spray 1-2 sprays per nostril twice a day as needed for runny nose/drainage.  May use Flonase (fluticasone) nasal spray 1 spray per nostril twice a day as needed for nasal congestion.

## 2021-09-29 ENCOUNTER — Ambulatory Visit: Payer: 59 | Admitting: Internal Medicine

## 2021-09-29 ENCOUNTER — Other Ambulatory Visit: Payer: Self-pay

## 2021-09-29 ENCOUNTER — Ambulatory Visit (INDEPENDENT_AMBULATORY_CARE_PROVIDER_SITE_OTHER): Payer: 59

## 2021-09-29 DIAGNOSIS — Z23 Encounter for immunization: Secondary | ICD-10-CM

## 2021-09-29 NOTE — Progress Notes (Signed)
° °  Covid-19 Vaccination Clinic  Name:  [name]    MRN: [id number] DOB: 1966/07/13  09/29/2021  [name] was observed post Covid-19 immunization for 15 minutes without incident. He was provided with Vaccine Information Sheet and instruction to access the V-Safe system.   [name] was instructed to call 911 with any severe reactions post vaccine: Difficulty breathing  Swelling of face and throat  A fast heartbeat  A bad rash all over body  Dizziness and weakness   Immunizations Administered     Name Date Dose VIS Date Route   Pfizer Covid-19 Vaccine Bivalent Booster 09/29/2021  9:56 AM 0.3 mL 06/03/2021 Intramuscular   Manufacturer: [employer], [employer]   Lot: [id number]   NDC: [phone]

## 2021-10-12 ENCOUNTER — Other Ambulatory Visit (HOSPITAL_COMMUNITY): Payer: Self-pay

## 2021-10-14 ENCOUNTER — Other Ambulatory Visit (HOSPITAL_COMMUNITY): Payer: Self-pay

## 2021-10-15 ENCOUNTER — Other Ambulatory Visit (HOSPITAL_COMMUNITY): Payer: Self-pay

## 2021-10-16 ENCOUNTER — Other Ambulatory Visit (HOSPITAL_COMMUNITY): Payer: Self-pay

## 2021-10-16 ENCOUNTER — Other Ambulatory Visit: Payer: Self-pay

## 2021-10-16 ENCOUNTER — Ambulatory Visit: Payer: 59 | Admitting: Nurse Practitioner

## 2021-10-16 ENCOUNTER — Encounter: Payer: Self-pay | Admitting: Nurse Practitioner

## 2021-10-16 VITALS — BP 120/76 | HR 99 | Temp 98.3°F | Ht 71.0 in | Wt 155.0 lb

## 2021-10-16 DIAGNOSIS — B351 Tinea unguium: Secondary | ICD-10-CM

## 2021-10-16 MED ORDER — CICLOPIROX 8 % EX SOLN
Freq: Every day | CUTANEOUS | 1 refills | Status: DC
  Filled 2021-10-16: qty 6.6, 30d supply, fill #0
  Filled 2021-10-30: qty 6.6, 30d supply, fill #1

## 2021-10-16 NOTE — Progress Notes (Signed)
Subjective:  Patient ID: [name], male    DOB: 27-Mar-1966  Age: 56 y.o. MRN: [id number]  CC:  Chief Complaint  Patient presents with   Nail Problem      HPI  This patient arrives today for the above.  He tells me he believes he has nail fungus.  He tells me this is been going on for months, however he is more recently started feeling discomfort in the area and thus that is why he is seeking treatment now.  He just bought some over-the-counter antifungal cream which she started applying yesterday.  He tells me that discomfort is already improved slightly.  He was hoping to be seen for further evaluation in case there are other treatment options available.  Past Medical History:  Diagnosis Date   Asthma    Avascular necrosis (HCC)    Hypertension    Sarcoidosis    Sickle cell anemia (HCC)       Family History  Problem Relation Age of Onset   Asthma Mother    Coronary artery disease Mother    Stroke Mother    Heart attack Mother    Sickle cell trait Mother    Prostate cancer Father    Cancer Father 14       prostate   Alcohol abuse Brother    Early death Brother    Drug abuse Brother    Diabetes Neg Hx    Heart disease Neg Hx    Hyperlipidemia Neg Hx    Hypertension Neg Hx    Kidney disease Neg Hx     Social History   Social History Narrative   Not on file   Social History   Tobacco Use   Smoking status: Former    Packs/day: 0.50    Years: 10.00    Pack years: 5.00    Types: Cigarettes    Quit date: 10/04/1993    Years since quitting: 28.0   Smokeless tobacco: Never  Substance Use Topics   Alcohol use: Yes    Alcohol/week: 28.0 standard drinks    Types: 14 Cans of beer, 14 Shots of liquor per week     Current Meds  Medication Sig   albuterol (VENTOLIN HFA) 108 (90 Base) MCG/ACT inhaler Inhale 2 puffs into the lungs every 6 (six) hours as needed for wheezing or shortness of breath.   ciclopirox (PENLAC) 8 % solution Apply topically at  bedtime. Apply over nail and surrounding skin. Apply daily over previous coat. After seven (7) days, may remove with alcohol and continue cycle.   diclofenac Sodium (VOLTAREN) 1 % GEL APPLY 2 TO 4 GRAMS TO AFFECTED AREA(S) 4 TIMES DAILY   EPINEPHrine 0.3 mg/0.3 mL IJ SOAJ injection Inject 0.3 mg into the muscle as needed for anaphylaxis.    Fluticasone-Umeclidin-Vilant (TRELEGY ELLIPTA) 200-62.5-25 MCG/INH AEPB Inhale 1 puff into the lungs daily. Rinse mouth after each use.   ibuprofen (ADVIL) 800 MG tablet TAKE 1 TABLET BY MOUTH EVERY 8 HOURS AS NEEDED   levalbuterol (XOPENEX HFA) 45 MCG/ACT inhaler Inhale 2 puffs into the lungs every 4 (four) hours as needed for wheezing or shortness of breath (coughing fits).   levalbuterol (XOPENEX) 0.63 MG/3ML nebulizer solution Take 3 mLs (0.63 mg total) by nebulization every 8 (eight) hours as needed for wheezing or shortness of breath.   montelukast (SINGULAIR) 10 MG tablet Take 1 tablet (10 mg total) by mouth at bedtime.   nebivolol (BYSTOLIC) 5 MG tablet Take 1  tablet (5 mg total) by mouth daily.   nitroGLYCERIN (NITRODUR - DOSED IN MG/24 HR) 0.2 mg/hr patch Apply 1/4 patch daily to tendon for tendonitis.   tezepelumab-ekko (TEZSPIRE) 210 MG/1. syringe Inject 1.91 mLs (210 mg total) into the skin once for 1 dose.   Current Facility-Administered Medications for the 10/16/21 encounter (Office Visit) with Elenore Paddy, NP  Medication   tezepelumab-ekko (TEZSPIRE) 210 MG/1. syringe 210 mg    ROS:  See HPI   Objective:   Today's Vitals: BP 120/76 (BP Location: Left Arm, Patient Position: Sitting, Cuff Size: Normal)    Pulse 99    Temp 98.3 F (36.8 C) (Oral)    Ht 5\' 11"  (1.803 m)    Wt 155 lb (70.3 kg)    SpO2 97%    BMI 21.62 kg/m  Vitals with BMI 10/16/2021 09/23/2021 08/26/2021  Height 5\' 11"  5\' 11"  5\' 11"   Weight 155 lbs 155 lbs 3 oz 152 lbs  BMI 21.63 21.66 21.21  Systolic 120 132 08/28/2021  Diastolic 76 84 88  Pulse 99 106 97      Physical Exam Vitals reviewed.  Constitutional:      General: He is not in acute distress.    Appearance: Normal appearance.  Cardiovascular:     Rate and Rhythm: Normal rate.  Pulmonary:     Effort: Pulmonary effort is normal.  Feet:     Right foot:     Toenail Condition: Right toenails are abnormally thick and long. Fungal disease present.    Left foot:     Toenail Condition: Left toenails are abnormally thick and long. Fungal disease present. Neurological:     Mental Status: He is alert.         Assessment and Plan   1. Onychomycosis      Plan: 1.  We discussed different treatment options including risk and benefits of these as well.  Per shared decision making he would like to avoid systemic therapy with terbinafine due to side effects at this time.  He would like to try topical treatment, will prescribe penlac nail solution.  He was counseled to apply daily, and to remove any residual with alcohol every 7 days.  He would also like to be referred to podiatry to see what other treatment options are available including possible debridement.  Referral ordered today.  Per medication information Penlac should be used with caution in people with immunosuppression, he is on monoclonal antibodies for treatment of asthma.  I did consult with pharmacist at [facility] and he does not see any contraindication with this patient regarding the nail solution.  Patient was told he could take many weeks for infection to completely resolve.  He was told not to use Penlac for longer than 48 weeks.   Tests ordered Orders Placed This Encounter  Procedures   Ambulatory referral to Podiatry      Meds ordered this encounter  Medications   ciclopirox (PENLAC) 8 % solution    Sig: Apply topically at bedtime. Apply over nail and surrounding skin. Apply daily over previous coat. After seven (7) days, may remove with alcohol and continue cycle.    Dispense:  6.6 mL     Refill:  1    Order Specific Question:   Supervising Provider    Answer:       Patient to follow-up as scheduled or sooner as needed.  132, NP

## 2021-10-21 ENCOUNTER — Ambulatory Visit: Payer: 59

## 2021-10-22 ENCOUNTER — Other Ambulatory Visit: Payer: Self-pay

## 2021-10-22 ENCOUNTER — Ambulatory Visit (INDEPENDENT_AMBULATORY_CARE_PROVIDER_SITE_OTHER): Payer: 59 | Admitting: *Deleted

## 2021-10-22 DIAGNOSIS — J4550 Severe persistent asthma, uncomplicated: Secondary | ICD-10-CM

## 2021-10-30 ENCOUNTER — Other Ambulatory Visit (HOSPITAL_COMMUNITY): Payer: Self-pay

## 2021-11-03 ENCOUNTER — Ambulatory Visit: Payer: 59 | Admitting: Podiatry

## 2021-11-03 ENCOUNTER — Other Ambulatory Visit: Payer: Self-pay

## 2021-11-03 ENCOUNTER — Other Ambulatory Visit (HOSPITAL_COMMUNITY): Payer: Self-pay

## 2021-11-03 DIAGNOSIS — B351 Tinea unguium: Secondary | ICD-10-CM | POA: Diagnosis not present

## 2021-11-03 DIAGNOSIS — M722 Plantar fascial fibromatosis: Secondary | ICD-10-CM

## 2021-11-03 MED ORDER — FLUCONAZOLE 150 MG PO TABS
150.0000 mg | ORAL_TABLET | ORAL | 2 refills | Status: DC
  Filled 2021-11-03: qty 4, 28d supply, fill #0

## 2021-11-03 MED ORDER — CICLOPIROX 8 % EX SOLN
Freq: Every day | CUTANEOUS | 1 refills | Status: DC
  Filled 2021-11-03: qty 6.6, 30d supply, fill #0

## 2021-11-03 MED ORDER — BETAMETHASONE SOD PHOS & ACET 6 (3-3) MG/ML IJ SUSP
6.0000 mg | Freq: Once | INTRAMUSCULAR | Status: AC
  Administered 2021-11-03: 6 mg

## 2021-11-03 NOTE — Patient Instructions (Signed)

## 2021-11-03 NOTE — Progress Notes (Signed)
°  Subjective:  Patient ID: [name], male    DOB: 1966/01/10,  MRN: [id number]  Chief Complaint  Patient presents with   Nail Problem    Concern wit poss nail fungus- discoloration of nails 1-5 bilat    Tinea Pedis    Pt reports he has itch and burning in foot- uses athletes foot for relief- ongoing for long period of time    55 y.o. male presents with the above complaint. History confirmed with patient.   Objective:  Physical Exam: warm, good capillary refill, no trophic changes or ulcerative lesions, normal DP and PT pulses, and normal sensory exam. Nails thickened, diystrophic Right Foot: tenderness to palpation medial calcaneal tuber, no pain with calcaneal squeeze, decreased ankle joint ROM, and +Silverskiold test Assessment:   1. Plantar fasciitis, right   2. Onychomycosis    Plan:  Patient was evaluated and treated and all questions answered.  Plantar Fasciitis -Educated patient on stretching and icing of the affected limb -Plantar fascial brace dispensed -Injection delivered to the plantar fascia of the right foot.  Procedure: Injection Tendon/Ligament Consent: Verbal consent obtained. Location: Right plantar fascia at the glabrous junction; medial approach. Skin Prep: Alcohol. Injectate: 1 cc 0.5% marcaine plain, 1 cc betamethasone acetate-betamethasone sodium phosphate Disposition: Patient tolerated procedure well. Injection site dressed with a band-aid.  Onychomycosis -Educated on etiology of nail fungus. -Discussed r/b of terbinafine vs lamisil -eRx for fluconazole weekly. Educated on risks and benefits of the medication.  Return in about 4 weeks (around 12/01/2021) for Plantar fasciitis, with XRs, Nail Fungus.

## 2021-11-04 ENCOUNTER — Other Ambulatory Visit (HOSPITAL_COMMUNITY): Payer: Self-pay

## 2021-11-05 ENCOUNTER — Other Ambulatory Visit (HOSPITAL_COMMUNITY): Payer: Self-pay

## 2021-11-09 ENCOUNTER — Other Ambulatory Visit (HOSPITAL_COMMUNITY): Payer: Self-pay

## 2021-11-10 ENCOUNTER — Other Ambulatory Visit (HOSPITAL_COMMUNITY): Payer: Self-pay

## 2021-11-11 ENCOUNTER — Other Ambulatory Visit (HOSPITAL_COMMUNITY): Payer: Self-pay

## 2021-11-13 ENCOUNTER — Other Ambulatory Visit (HOSPITAL_COMMUNITY): Payer: Self-pay

## 2021-11-14 ENCOUNTER — Other Ambulatory Visit (HOSPITAL_COMMUNITY): Payer: Self-pay

## 2021-11-18 ENCOUNTER — Other Ambulatory Visit: Payer: Self-pay

## 2021-11-18 ENCOUNTER — Ambulatory Visit (INDEPENDENT_AMBULATORY_CARE_PROVIDER_SITE_OTHER): Payer: 59

## 2021-11-18 DIAGNOSIS — J4550 Severe persistent asthma, uncomplicated: Secondary | ICD-10-CM | POA: Diagnosis not present

## 2021-11-19 ENCOUNTER — Ambulatory Visit: Payer: 59

## 2021-11-26 ENCOUNTER — Telehealth: Payer: Self-pay | Admitting: Sports Medicine

## 2021-11-26 NOTE — Telephone Encounter (Signed)
Left message for pt with Dr Wynema Birch recommendation and to call if any further questions.

## 2021-11-26 NOTE — Telephone Encounter (Signed)
Pt called and is taking medication that Dr Samuella Cota prescribed but is having really bad burning and itching. Would you be willing to call in a prescription cream for him to use on his feet to help with the burning and itching. He uses [facility].

## 2021-12-01 ENCOUNTER — Ambulatory Visit: Payer: 59 | Admitting: Podiatry

## 2021-12-03 ENCOUNTER — Other Ambulatory Visit: Payer: Self-pay | Admitting: Podiatry

## 2021-12-03 ENCOUNTER — Other Ambulatory Visit (HOSPITAL_COMMUNITY): Payer: Self-pay

## 2021-12-03 ENCOUNTER — Telehealth: Payer: Self-pay | Admitting: Podiatry

## 2021-12-03 DIAGNOSIS — Z79899 Other long term (current) drug therapy: Secondary | ICD-10-CM

## 2021-12-03 MED ORDER — FLUCONAZOLE 150 MG PO TABS
150.0000 mg | ORAL_TABLET | ORAL | 2 refills | Status: DC
  Filled 2021-12-03: qty 4, 28d supply, fill #0

## 2021-12-03 NOTE — Telephone Encounter (Signed)
Pharmacy [facility]   Medication fluconazole (DIFLUCAN) 150 MG tablet  Also would like a cream to rub on feet to help with the itching and burning

## 2021-12-04 ENCOUNTER — Other Ambulatory Visit (HOSPITAL_COMMUNITY): Payer: Self-pay

## 2021-12-04 ENCOUNTER — Other Ambulatory Visit: Payer: Self-pay | Admitting: Podiatry

## 2021-12-04 ENCOUNTER — Other Ambulatory Visit (HOSPITAL_COMMUNITY)
Admission: RE | Admit: 2021-12-04 | Discharge: 2021-12-04 | Disposition: A | Discharge status: Home / Self Care | Payer: 59 | Source: Ambulatory Visit | Attending: Podiatry | Admitting: Podiatry

## 2021-12-04 DIAGNOSIS — Z79899 Other long term (current) drug therapy: Secondary | ICD-10-CM | POA: Insufficient documentation

## 2021-12-04 DIAGNOSIS — Z5181 Encounter for therapeutic drug level monitoring: Secondary | ICD-10-CM | POA: Diagnosis not present

## 2021-12-04 LAB — COMPREHENSIVE METABOLIC PANEL
ALT: 28 U/L (ref 0–44)
AST: 29 U/L (ref 15–41)
Albumin: 4.6 g/dL (ref 3.5–5.0)
Alkaline Phosphatase: 53 U/L (ref 38–126)
Anion gap: 11 (ref 5–15)
BUN: 15 mg/dL (ref 6–20)
CO2: 25 mmol/L (ref 22–32)
Calcium: 9 mg/dL (ref 8.9–10.3)
Chloride: 100 mmol/L (ref 98–111)
Creatinine, Ser: 0.84 mg/dL (ref 0.61–1.24)
GFR, Estimated: >60 mL/min (ref >60)
Glucose, Bld: 100 mg/dL — ABNORMAL HIGH (ref 70–99)
Potassium: 3.8 mmol/L (ref 3.5–5.1)
Sodium: 136 mmol/L (ref 135–145)
Total Bilirubin: 0.7 mg/dL (ref 0.3–1.2)
Total Protein: 8.6 g/dL — ABNORMAL HIGH (ref 6.5–8.1)

## 2021-12-04 LAB — CBC WITH DIFFERENTIAL/PLATELET
Abs Immature Granulocytes: 0.01 10*3/uL (ref 0.00–0.07)
Basophils Absolute: 0 10*3/uL (ref 0.0–0.1)
Basophils Relative: 1 %
Eosinophils Absolute: 0.1 10*3/uL (ref 0.0–0.5)
Eosinophils Relative: 3 %
HCT: 44.7 % (ref 39.0–52.0)
Hemoglobin: 16.4 g/dL (ref 13.0–17.0)
Immature Granulocytes: 0 %
Lymphocytes Relative: 35 %
Lymphs Abs: 1.5 10*3/uL (ref 0.7–4.0)
MCH: 29.1 pg (ref 26.0–34.0)
MCHC: 36.7 g/dL — ABNORMAL HIGH (ref 30.0–36.0)
MCV: 79.3 fL — ABNORMAL LOW (ref 80.0–100.0)
Monocytes Absolute: 0.5 10*3/uL (ref 0.1–1.0)
Monocytes Relative: 12 %
Neutro Abs: 2.1 10*3/uL (ref 1.7–7.7)
Neutrophils Relative %: 49 %
Platelets: 282 10*3/uL (ref 150–400)
RBC: 5.64 MIL/uL (ref 4.22–5.81)
RDW: 13.4 % (ref 11.5–15.5)
WBC: 4.2 10*3/uL (ref 4.0–10.5)
nRBC: 0 % (ref 0.0–0.2)

## 2021-12-04 MED ORDER — CLOTRIMAZOLE-BETAMETHASONE 1-0.05 % EX CREA
1.0000 "application " | TOPICAL_CREAM | Freq: Two times a day (BID) | CUTANEOUS | 0 refills | Status: DC
  Filled 2021-12-04: qty 30, 10d supply, fill #0

## 2021-12-04 NOTE — Telephone Encounter (Signed)
Left message on voicemail letting patient know that prescription was called in .

## 2021-12-04 NOTE — Progress Notes (Signed)
Lotrisone cream sent

## 2021-12-04 NOTE — Telephone Encounter (Signed)
Called patient to let him know that medication was called in. He is going to have to have the lab work done at [employer] while he is at work.   Patient also said that Dr Samuella Cota was going to prescribe him something for the burning and itching if the OTC sprays did not work on his feet.  He is still using the nail polish for the nail fungus, but nails are still yellow.

## 2021-12-07 ENCOUNTER — Other Ambulatory Visit (HOSPITAL_COMMUNITY): Payer: Self-pay

## 2021-12-11 ENCOUNTER — Other Ambulatory Visit (HOSPITAL_COMMUNITY): Payer: Self-pay

## 2021-12-16 ENCOUNTER — Ambulatory Visit: Payer: 59

## 2021-12-17 ENCOUNTER — Ambulatory Visit: Payer: 59 | Admitting: Sports Medicine

## 2021-12-17 ENCOUNTER — Ambulatory Visit (INDEPENDENT_AMBULATORY_CARE_PROVIDER_SITE_OTHER): Payer: 59

## 2021-12-17 ENCOUNTER — Encounter: Payer: Self-pay | Admitting: Podiatry

## 2021-12-17 ENCOUNTER — Other Ambulatory Visit (HOSPITAL_COMMUNITY): Payer: Self-pay

## 2021-12-17 ENCOUNTER — Ambulatory Visit: Payer: 59 | Admitting: Podiatry

## 2021-12-17 ENCOUNTER — Other Ambulatory Visit: Payer: Self-pay

## 2021-12-17 DIAGNOSIS — J4550 Severe persistent asthma, uncomplicated: Secondary | ICD-10-CM | POA: Diagnosis not present

## 2021-12-17 DIAGNOSIS — M722 Plantar fascial fibromatosis: Secondary | ICD-10-CM

## 2021-12-17 DIAGNOSIS — B351 Tinea unguium: Secondary | ICD-10-CM

## 2021-12-17 MED ORDER — TRIAMCINOLONE ACETONIDE 10 MG/ML IJ SUSP: 10.0000 mg | Freq: Once | INTRAMUSCULAR | Status: DC

## 2021-12-17 MED ORDER — MELOXICAM 15 MG PO TABS
15.0000 mg | ORAL_TABLET | Freq: Every day | ORAL | 0 refills | Status: DC
  Filled 2021-12-17: qty 30, 30d supply, fill #0

## 2021-12-17 NOTE — Patient Instructions (Signed)

## 2021-12-21 DIAGNOSIS — B351 Tinea unguium: Secondary | ICD-10-CM | POA: Insufficient documentation

## 2021-12-21 DIAGNOSIS — M722 Plantar fascial fibromatosis: Secondary | ICD-10-CM | POA: Insufficient documentation

## 2021-12-21 NOTE — Progress Notes (Signed)
Subjective: ?56 year old male presents the office today for concerns of bilateral heel pain with swelling over several months.  Patient injection of right heel that was very helpful however both the heels are still causing discomfort.  No recent injury or changes otherwise.  He is also still taking fluconazole once a week and has been on for 4 weeks for nail fungus.  He is also using a topical antifungal for skin itching which is both been helpful.  Denies any recent injury or change otherwise. ? ?Objective: ?AAO x3, NAD ?DP/PT pulses palpable bilaterally, CRT less than 3 seconds ?Tenderness to palpation along the plantar medial tubercle of the calcaneus at the insertion of plantar fascia on the left and right foot. There is no pain along the course of the plantar fascia within the arch of the foot. Plantar fascia appears to be intact. There is no pain with lateral compression of the calcaneus or pain with vibratory sensation. There is no pain along the course or insertion of the achilles tendon. No other areas of tenderness to bilateral lower extremities. ?Measurement hypertrophic, dystrophic, discolored with yellow, brown discoloration.  No pain in the nails there is no swelling or redness or drainage. ?No pain with calf compression, swelling, warmth, erythema ? ?Assessment: ?Bilateral heel pain, Plantar fasciitis; onychomycosis ? ?Plan: ?-All treatment options discussed with the patient including all alternatives, risks, complications.  ?-X-rays obtained reviewed.  No evidence of acute fracture or stress fracture. ?-Steroid injection performed to bilateral heels.  See procedure note below.  Continue stretching, icing daily as well as wearing shoes and good arch supports.  Dispensed metatarsal braces x2 to give additional support and stability. ?-Continue fluconazole weekly.  Continue topical antifungal as well. ?-Patient encouraged to call the office with any questions, concerns, change in symptoms.   ? ?Procedure: Injection Tendon/Ligament ?Discussed alternatives, risks, complications and verbal consent was obtained.  ?Location: Bilateral plantar fascia at the glabrous junction; medial approach. ?Skin Prep: Alcohol. ?Injectate: 0.5cc 0.5% marcaine plain, 0.5 cc 2% lidocaine plain and, 1 cc kenalog 10. ?Disposition: Patient tolerated procedure well. Injection site dressed with a band-aid.  ?Post-injection care was discussed and return precautions discussed.  ? ?Vivi Barrack DPM ? ?

## 2021-12-31 ENCOUNTER — Other Ambulatory Visit (HOSPITAL_COMMUNITY): Payer: Self-pay

## 2022-01-04 ENCOUNTER — Other Ambulatory Visit (HOSPITAL_COMMUNITY): Payer: Self-pay

## 2022-01-06 ENCOUNTER — Other Ambulatory Visit (HOSPITAL_COMMUNITY): Payer: Self-pay

## 2022-01-13 ENCOUNTER — Ambulatory Visit (INDEPENDENT_AMBULATORY_CARE_PROVIDER_SITE_OTHER): Payer: 59 | Admitting: *Deleted

## 2022-01-13 DIAGNOSIS — J4550 Severe persistent asthma, uncomplicated: Secondary | ICD-10-CM | POA: Diagnosis not present

## 2022-01-14 ENCOUNTER — Ambulatory Visit: Payer: 59

## 2022-01-24 NOTE — Progress Notes (Deleted)
Follow Up Note  RE: [name] MRN: [id number] DOB: [birth year] Date of Office Visit: 01/25/2022  Referring provider: Etta Grandchild, MD Primary care provider: Etta Grandchild, MD  Chief Complaint: No chief complaint on file.  History of Present Illness: I had the pleasure of seeing [name] for a follow up visit at the [facility] on 01/24/2022. He is a 56 y.o. male, who is being followed for asthma on Tezspire, allergic rhinitis. His previous allergy office visit was on 09/23/2021 with Dr. Selena Batten. Today is a regular follow up visit.  Severe persistent asthma without complication Doing much better with below regimen and not using rescue inhaler.   Today's spirometry showed some mixed restriction and obstruction - slightly improved from previous one.  Daily controller medication(s): Trelegy 1puff once a day and rinse mouth after each use.  Continue Tezspire injection every 4 weeks - results given.  Restart montelukast (singulair) 10mg  daily at night. May use levoalbuterol rescue inhaler 2 puffs every 4 to 6 hours as needed for shortness of breath, chest tightness, coughing, and wheezing. May use levoalbuterol rescue inhaler 2 puffs 5 to 15 minutes prior to strenuous physical activities. Monitor frequency of use.  Recommend getting Covid-19 bivalent booster.  Get spirometry at next visit.   Perennial and seasonal allergic rhinitis Past history - 2019 skin testing was positive to grass, weed, ragweed, trees, mold, dust mites, cockroach, cat and dog.  Interim history - asymptomatic.  Continue environmental control measures.  Use over the counter antihistamines such as Zyrtec (cetirizine), Claritin (loratadine), Allegra (fexofenadine), or Xyzal (levocetirizine) daily as needed. May take twice a day during allergy flares. May switch antihistamines every few months. May use azelastine nasal spray 1-2 sprays per nostril twice a day as needed for runny  nose/drainage. May use Flonase (fluticasone) nasal spray 1 spray per nostril twice a day as needed for nasal congestion.    Right ear pain Right ear pain x few days. Normal exam today. Denies teeth pain or TMJ issues. May take tylenol or ibuprofen for the ear pain prn.  If not feeling better then go see PCP next week - already has appointment.    Return in about 4 months (around 01/22/2022).  Assessment and Plan: [name] is a 56 y.o. male with: No problem-specific Assessment & Plan notes found for this encounter.  No follow-ups on file.  No orders of the defined types were placed in this encounter.  Lab Orders  No laboratory test(s) ordered today    Diagnostics: Spirometry:  Tracings reviewed. His effort: {Blank single:19197::"Good reproducible efforts.","It was hard to get consistent efforts and there is a question as to whether this reflects a maximal maneuver.","Poor effort, data can not be interpreted."} FVC: ***L FEV1: ***L, ***% predicted FEV1/FVC ratio: ***% Interpretation: {Blank single:19197::"Spirometry consistent with mild obstructive disease","Spirometry consistent with moderate obstructive disease","Spirometry consistent with severe obstructive disease","Spirometry consistent with possible restrictive disease","Spirometry consistent with mixed obstructive and restrictive disease","Spirometry uninterpretable due to technique","Spirometry consistent with normal pattern","No overt abnormalities noted given today's efforts"}.  Please see scanned spirometry results for details.  Skin Testing: {Blank single:19197::"Select foods","Environmental allergy panel","Environmental allergy panel and select foods","Food allergy panel","None","Deferred due to recent antihistamines use"}. *** Results discussed with patient/family.   Medication List:  Current Outpatient Medications  Medication Sig Dispense Refill   albuterol (VENTOLIN HFA) 108 (90 Base) MCG/ACT inhaler Inhale 2 puffs  into the lungs every 6 (six) hours as needed for wheezing or shortness of breath.  Azelastine HCl 0.15 % SOLN PLACE 1-2 SPRAYS INTO BOTH NOSTRILS 2 TIMES DAILY AS NEEDED (RUNNY NOSE). (Patient not taking: Reported on 06/29/2021) 30 mL 5   ciclopirox (PENLAC) 8 % solution Apply topically at bedtime. Apply over nail and surrounding skin. Apply daily over previous coat. After seven (7) days, may remove with alcohol and continue cycle. 6.6 mL 1   clotrimazole-betamethasone (LOTRISONE) cream Apply to affected area 2 (two) times daily. 30 g 0   EPINEPHrine 0.3 mg/0.3 mL IJ SOAJ injection Inject 0.3 mg into the muscle as needed for anaphylaxis.   2   fluconazole (DIFLUCAN) 150 MG tablet Take 1 tablet (150 mg total) by mouth once a week. 4 tablet 2   fluticasone (FLONASE) 50 MCG/ACT nasal spray PLACE 1 SPRAY INTO BOTH NOSTRILS IN THE MORNING AND AT BEDTIME. FOR NASAL CONGESTION (Patient not taking: Reported on 06/29/2021) 16 g 5   Fluticasone-Umeclidin-Vilant (TRELEGY ELLIPTA) 200-62.5-25 MCG/INH AEPB Inhale 1 puff into the lungs daily. Rinse mouth after each use. 60 each 3   ibuprofen (ADVIL) 800 MG tablet TAKE 1 TABLET BY MOUTH EVERY 8 HOURS AS NEEDED 90 tablet 2   levalbuterol (XOPENEX HFA) 45 MCG/ACT inhaler Inhale 2 puffs into the lungs every 4 (four) hours as needed for wheezing or shortness of breath (coughing fits). 15 g 1   levalbuterol (XOPENEX) 0.63 MG/3ML nebulizer solution Take 3 mLs (0.63 mg total) by nebulization every 8 (eight) hours as needed for wheezing or shortness of breath. 150 mL 3   meloxicam (MOBIC) 15 MG tablet Take 1 tablet (15 mg total) by mouth daily. 30 tablet 0   montelukast (SINGULAIR) 10 MG tablet Take 1 tablet (10 mg total) by mouth at bedtime. 30 tablet 5   nebivolol (BYSTOLIC) 5 MG tablet Take 1 tablet (5 mg total) by mouth daily. 90 tablet 1   nitroGLYCERIN (NITRODUR - DOSED IN MG/24 HR) 0.2 mg/hr patch Apply 1/4 patch daily to tendon for tendonitis. 30 patch 1    tezepelumab-ekko (TEZSPIRE) 210 MG/1.91ML syringe Inject 1.91 mLs (210 mg total) into the skin once for 1 dose. 1.91 mL 11   Current Facility-Administered Medications  Medication Dose Route Frequency Provider Last Rate Last Admin   tezepelumab-ekko (TEZSPIRE) 210 MG/1.91ML syringe 210 mg  210 mg Subcutaneous Q28 days Ellamae SiaKim, Aina Rossbach M, DO   210 mg at 01/13/22 1518   triamcinolone acetonide (KENALOG) 10 MG/ML injection 10 mg  10 mg Other Once Vivi BarrackWagoner, Matthew R, DPM       Allergies: No Known Allergies I reviewed his past medical history, social history, family history, and environmental history and no significant changes have been reported from his previous visit.  Review of Systems  Constitutional:  Negative for appetite change, chills, fever and unexpected weight change.  HENT:  Positive for ear pain. Negative for congestion and rhinorrhea.   Eyes:  Negative for itching.  Respiratory:  Negative for cough, chest tightness, shortness of breath and wheezing.   Gastrointestinal:  Negative for abdominal pain.  Skin:  Negative for rash.  Allergic/Immunologic: Positive for environmental allergies.  Neurological:  Negative for headaches.   Objective: There were no vitals taken for this visit. There is no height or weight on file to calculate BMI. Physical Exam Vitals and nursing note reviewed.  Constitutional:      Appearance: Normal appearance. He is well-developed.  HENT:     Head: Normocephalic and atraumatic.     Right Ear: Tympanic membrane and external ear normal.  Left Ear: Tympanic membrane and external ear normal.     Nose: Nose normal.     Mouth/Throat:     Mouth: Mucous membranes are moist.     Pharynx: Oropharynx is clear.  Eyes:     Conjunctiva/sclera: Conjunctivae normal.  Cardiovascular:     Rate and Rhythm: Normal rate and regular rhythm.     Heart sounds: Normal heart sounds. No murmur heard. Pulmonary:     Effort: Pulmonary effort is normal.     Breath sounds:  Normal breath sounds. No wheezing, rhonchi or rales.  Musculoskeletal:     Cervical back: Neck supple.  Skin:    General: Skin is warm.     Findings: No rash.  Neurological:     Mental Status: He is alert and oriented to person, place, and time.  Psychiatric:        Behavior: Behavior normal.   Previous notes and tests were reviewed. The plan was reviewed with the patient/family, and all questions/concerned were addressed.  It was my pleasure to see Stellan today and participate in his care. Please feel free to contact me with any questions or concerns.  Sincerely,  Wyline Mood, DO Allergy & Immunology  [facility] office: [phone] [facility] office: [phone]

## 2022-01-25 ENCOUNTER — Ambulatory Visit: Payer: 59 | Admitting: Allergy

## 2022-01-25 DIAGNOSIS — J4550 Severe persistent asthma, uncomplicated: Secondary | ICD-10-CM

## 2022-01-25 DIAGNOSIS — J3089 Other allergic rhinitis: Secondary | ICD-10-CM

## 2022-01-28 ENCOUNTER — Other Ambulatory Visit (HOSPITAL_COMMUNITY): Payer: Self-pay

## 2022-02-01 ENCOUNTER — Other Ambulatory Visit (HOSPITAL_COMMUNITY): Payer: Self-pay

## 2022-02-01 ENCOUNTER — Telehealth: Payer: Self-pay | Admitting: Podiatry

## 2022-02-01 DIAGNOSIS — B351 Tinea unguium: Secondary | ICD-10-CM

## 2022-02-01 MED ORDER — FLUCONAZOLE 150 MG PO TABS
150.0000 mg | ORAL_TABLET | ORAL | 2 refills | Status: DC
  Filled 2022-02-01: qty 4, 28d supply, fill #0

## 2022-02-01 MED ORDER — CICLOPIROX 8 % EX SOLN
Freq: Every day | CUTANEOUS | 1 refills | Status: DC
  Filled 2022-02-01: qty 6.6, 30d supply, fill #0

## 2022-02-01 MED ORDER — CLOTRIMAZOLE-BETAMETHASONE 1-0.05 % EX CREA
1.0000 | TOPICAL_CREAM | Freq: Two times a day (BID) | CUTANEOUS | 0 refills | Status: DC
Start: 2022-02-01 — End: 2023-09-05
  Filled 2022-02-01: qty 30, 15d supply, fill #0

## 2022-02-01 NOTE — Telephone Encounter (Signed)
Left message on voice mail to inform patient that medication refills has been sent to pharmacy. ?

## 2022-02-01 NOTE — Telephone Encounter (Signed)
Pt need refill on medications ? ?Fluconazole (DIFLUCAN) 150 MG tablet ? ?clotrimazole-betamethasone (LOTRISONE) cream ? ?Nail polish for toe nails ? ?[facility] ? ?Pt cancelled appt for tomorrow due to he is sick and will call back when he is feeling well.  ?

## 2022-02-01 NOTE — Telephone Encounter (Signed)
Called patient has been notified thru vmessage.

## 2022-02-01 NOTE — Addendum Note (Signed)
Addended byLilian Kapur, Anari Evitt R on: 02/01/2022 08:19 AM ? ? Modules accepted: Orders ? ?

## 2022-02-02 ENCOUNTER — Ambulatory Visit: Payer: 59 | Admitting: Podiatry

## 2022-02-03 ENCOUNTER — Other Ambulatory Visit (HOSPITAL_COMMUNITY): Payer: Self-pay

## 2022-02-09 NOTE — Progress Notes (Signed)
? ?Follow Up Note ? ?RE: [name] MRN: [id number] DOB: Jan 09, 1966 ?Date of Office Visit: 02/10/2022 ? ?Referring provider: Janith Lima, MD ?Primary care provider: Janith Lima, MD ? ?Chief Complaint: Follow-up (Night sweats--nasal congestion in the morning) ? ?History of Present Illness: ?I had the pleasure of seeing [name] for a follow up visit at the [facility] on 02/10/2022. He is a 56 y.o. male, who is being followed for asthma on Tezspire, allergic rhinitis and right ear pain. His previous allergy office visit was on 09/23/2021 with Dr. Maudie Mercury. Today is a regular follow up visit. ? ?Severe persistent asthma ?Patient has not been using Trelegy daily and last use was about 1 month ago. ?He is also not taking Singulair daily. Last use was 3 weeks ago. ? ?Denies any ER/urgent care visits or prednisone use since the last visit. ? ?Sometimes having some shortness of breath, wheezing. He works in the [employer] and was told yesterday that he was wheezing but he didn't take his inhaler.  ? ?Doing well with Tezspire injections. ? ?Perennial and seasonal allergic rhinitis ?Waking up with sneezing, rhinorrhea, nasal congestion.  ?Not taking anything currently.  ? ?Assessment and Plan: ?Adonis is a 56 y.o. male with: ?Severe persistent asthma without complication ?Past history - sarcoidosis.  ?Interim history - Not controlled due stopping Trelegy and Singulair for about 1 month now. No recent prednisone use.  ?Today's spirometry showed some mixed restriction and obstruction with 33% improvement in FEV1 post bronchodilator treatment. Clinically feeling improved.  ?MAKE SURE TO TAKE YOUR TRELEGY AND SINGULAIR EVERYDAY! ?Daily controller medication(s): RESTART Trelegy 258mcg 1puff once a day and rinse mouth after each use.  Sample given. ?Continue Tezspire injection every 4 weeks - given today. ?Restart montelukast (Singulair) 10mg  daily at night. ?May use levoalbuterol rescue inhaler 2 puffs  every 4 to 6 hours as needed for shortness of breath, chest tightness, coughing, and wheezing. May use levoalbuterol rescue inhaler 2 puffs 5 to 15 minutes prior to strenuous physical activities. Monitor frequency of use.  ?Get spirometry at next visit. ? ?Perennial and seasonal allergic rhinitis ?Past history - 2019 skin testing was positive to grass, weed, ragweed, trees, mold, dust mites, cockroach, cat and dog.  ?Interim history - increased rhinitis symptoms but does not take any meds. ?Restart montelukast (Singulair) 10mg  daily at night. ?Use over the counter antihistamines such as Zyrtec (cetirizine), Claritin (loratadine), Allegra (fexofenadine), or Xyzal (levocetirizine) daily as needed. May take twice a day during allergy flares. May switch antihistamines every few months. ?Start Ryaltris (olopatadine + mometasone nasal spray combination) 1-2 sprays per nostril twice a day. Sample given. ?Let met know if this works and I will send in a prescription for this.  ? ?Return in about 4 months (around 06/13/2022). ? ?Meds ordered this encounter  ?Medications  ? montelukast (SINGULAIR) 10 MG tablet  ?  Sig: Take 1 tablet (10 mg total) by mouth at bedtime.  ?  Dispense:  30 tablet  ?  Refill:  5  ? levalbuterol (XOPENEX HFA) 45 MCG/ACT inhaler  ?  Sig: Inhale 2 puffs into the lungs every 4 (four) hours as needed for wheezing or shortness of breath (coughing fits).  ?  Dispense:  1 each  ?  Refill:  1  ? Fluticasone-Umeclidin-Vilant (TRELEGY ELLIPTA) 200-62.5-25 MCG/ACT AEPB  ?  Sig: Inhale 1 puff into the lungs daily. Rinse mouth after each use.  ?  Dispense:  60 each  ?  Refill:  5  ? Olopatadine-Mometasone (RYALTRIS) [id number] MCG/ACT SUSP  ?  Sig: Place 1-2 sprays into the nose in the morning and at bedtime.  ?  Dispense:  29 g  ?  Refill:  0  ? ?Lab Orders  ?No laboratory test(s) ordered today  ? ? ?Diagnostics: ?Spirometry:  ?Tracings reviewed. His effort: Good reproducible efforts. ?FVC: 2.15L ?FEV1: 1.33L, 39%  predicted ?FEV1/FVC ratio: 62% ?Interpretation: Spirometry consistent with mixed obstructive and restrictive disease with 33% improvement in FEV1 post bronchodilator treatment. Clinically feeling improved.  ? ?Please see scanned spirometry results for details. ? ?Medication List:  ?Current Outpatient Medications  ?Medication Sig Dispense Refill  ? Fluticasone-Umeclidin-Vilant (TRELEGY ELLIPTA) 200-62.5-25 MCG/ACT AEPB Inhale 1 puff into the lungs daily. Rinse mouth after each use. 60 each 5  ? levalbuterol (XOPENEX HFA) 45 MCG/ACT inhaler Inhale 2 puffs into the lungs every 4 (four) hours as needed for wheezing or shortness of breath (coughing fits). 1 each 1  ? Olopatadine-Mometasone (RYALTRIS) [id number] MCG/ACT SUSP Place 1-2 sprays into the nose in the morning and at bedtime. 29 g 0  ? ciclopirox (PENLAC) 8 % solution Apply topically at bedtime. Apply over nail and surrounding skin. Apply daily over previous coat. After 7 days, may remove with alcohol and continue cycle. 6.6 mL 1  ? clotrimazole-betamethasone (LOTRISONE) cream Apply to affected area 2 times daily. 30 g 0  ? EPINEPHrine 0.3 mg/0.3 mL IJ SOAJ injection Inject 0.3 mg into the muscle as needed for anaphylaxis.   2  ? fluconazole (DIFLUCAN) 150 MG tablet Take 1 tablet by mouth once a week. 4 tablet 2  ? ibuprofen (ADVIL) 800 MG tablet TAKE 1 TABLET BY MOUTH EVERY 8 HOURS AS NEEDED 90 tablet 2  ? levalbuterol (XOPENEX) 0.63 MG/3ML nebulizer solution Take 3 mLs (0.63 mg total) by nebulization every 8 (eight) hours as needed for wheezing or shortness of breath. 150 mL 3  ? meloxicam (MOBIC) 15 MG tablet Take 1 tablet (15 mg total) by mouth daily. 30 tablet 0  ? montelukast (SINGULAIR) 10 MG tablet Take 1 tablet (10 mg total) by mouth at bedtime. 30 tablet 5  ? nebivolol (BYSTOLIC) 5 MG tablet Take 1 tablet (5 mg total) by mouth daily. 90 tablet 1  ? nitroGLYCERIN (NITRODUR - DOSED IN MG/24 HR) 0.2 mg/hr patch Apply 1/4 patch daily to tendon for tendonitis.  30 patch 1  ? tezepelumab-ekko (TEZSPIRE) 210 MG/1.91ML syringe Inject 1.91 mLs (210 mg total) into the skin once for 1 dose. 1.91 mL 11  ? ?Current Facility-Administered Medications  ?Medication Dose Route Frequency Provider Last Rate Last Admin  ? tezepelumab-ekko (TEZSPIRE) 210 MG/1.91ML syringe 210 mg  210 mg Subcutaneous Q28 days Garnet Sierras, DO   210 mg at 01/13/22 1518  ? triamcinolone acetonide (KENALOG) 10 MG/ML injection 10 mg  10 mg Other Once Trula Slade, DPM      ? ?Allergies: ?No Known Allergies ?I reviewed his past medical history, social history, family history, and environmental history and no significant changes have been reported from his previous visit. ? ?Review of Systems  ?Constitutional:  Negative for appetite change, chills, fever and unexpected weight change.  ?HENT:  Positive for congestion, rhinorrhea and sneezing. Negative for ear pain.   ?Eyes:  Negative for itching.  ?Respiratory:  Positive for shortness of breath and wheezing. Negative for cough and chest tightness.   ?Gastrointestinal:  Negative for abdominal pain.  ?Skin:  Negative for rash.  ?Allergic/Immunologic: Positive for  environmental allergies.  ?Neurological:  Negative for headaches.  ? ?Objective: ?BP 126/80   Pulse (!) 104   Temp 97.6 ?F (36.4 ?C)   Resp 16   Ht $R'5\' 11"'vx$  (1.803 m)   Wt 156 lb 2 oz (70.8 kg)   SpO2 98%   BMI 21.78 kg/m?  ?Body mass index is 21.78 kg/m?Marland Kitchen ?Physical Exam ?Vitals and nursing note reviewed.  ?Constitutional:   ?   Appearance: Normal appearance. He is well-developed.  ?HENT:  ?   Head: Normocephalic and atraumatic.  ?   Right Ear: Tympanic membrane and external ear normal.  ?   Left Ear: Tympanic membrane and external ear normal.  ?   Nose: Nose normal.  ?   Mouth/Throat:  ?   Mouth: Mucous membranes are moist.  ?   Pharynx: Oropharynx is clear.  ?Eyes:  ?   Conjunctiva/sclera: Conjunctivae normal.  ?Cardiovascular:  ?   Rate and Rhythm: Normal rate and regular rhythm.  ?   Heart  sounds: Normal heart sounds. No murmur heard. ?Pulmonary:  ?   Effort: Pulmonary effort is normal.  ?   Breath sounds: Normal breath sounds. No wheezing, rhonchi or rales.  ?   Comments: Improved airflow post broncho

## 2022-02-10 ENCOUNTER — Ambulatory Visit (INDEPENDENT_AMBULATORY_CARE_PROVIDER_SITE_OTHER): Payer: 59 | Admitting: Allergy

## 2022-02-10 ENCOUNTER — Other Ambulatory Visit (HOSPITAL_COMMUNITY): Payer: Self-pay

## 2022-02-10 ENCOUNTER — Ambulatory Visit (INDEPENDENT_AMBULATORY_CARE_PROVIDER_SITE_OTHER): Payer: 59

## 2022-02-10 ENCOUNTER — Encounter: Payer: Self-pay | Admitting: Allergy

## 2022-02-10 VITALS — BP 126/80 | HR 104 | Temp 97.6°F | Resp 16 | Ht 71.0 in | Wt 156.1 lb

## 2022-02-10 DIAGNOSIS — J4550 Severe persistent asthma, uncomplicated: Secondary | ICD-10-CM

## 2022-02-10 DIAGNOSIS — J3089 Other allergic rhinitis: Secondary | ICD-10-CM

## 2022-02-10 DIAGNOSIS — D869 Sarcoidosis, unspecified: Secondary | ICD-10-CM | POA: Diagnosis not present

## 2022-02-10 MED ORDER — TRELEGY ELLIPTA 200-62.5-25 MCG/ACT IN AEPB
1.0000 | INHALATION_SPRAY | Freq: Every day | RESPIRATORY_TRACT | 5 refills | Status: DC
  Filled 2022-02-10: qty 60, 30d supply, fill #0

## 2022-02-10 MED ORDER — RYALTRIS 665-25 MCG/ACT NA SUSP: 1.0000 | Freq: Two times a day (BID) | NASAL | 0 refills | Status: DC

## 2022-02-10 MED ORDER — LEVALBUTEROL TARTRATE 45 MCG/ACT IN AERO
2.0000 | INHALATION_SPRAY | RESPIRATORY_TRACT | 1 refills | Status: DC | PRN
  Filled 2022-02-10: qty 15, 30d supply, fill #0
  Filled 2022-04-07: qty 15, 16d supply, fill #0

## 2022-02-10 MED ORDER — MONTELUKAST SODIUM 10 MG PO TABS
10.0000 mg | ORAL_TABLET | Freq: Every day | ORAL | 5 refills | Status: DC
  Filled 2022-02-10: qty 30, 30d supply, fill #0

## 2022-02-10 NOTE — Assessment & Plan Note (Signed)
Past history - 2019 skin testing was positive to grass, weed, ragweed, trees, mold, dust mites, cockroach, cat and dog.  ?Interim history - increased rhinitis symptoms but does not take any meds. ?? Restart montelukast (Singulair) 77m daily at night. ?? Use over the counter antihistamines such as Zyrtec (cetirizine), Claritin (loratadine), Allegra (fexofenadine), or Xyzal (levocetirizine) daily as needed. May take twice a day during allergy flares. May switch antihistamines every few months. ?? Start Ryaltris (olopatadine + mometasone nasal spray combination) 1-2 sprays per nostril twice a day. Sample given. ?? Let met know if this works and I will send in a prescription for this.  ?

## 2022-02-10 NOTE — Assessment & Plan Note (Addendum)
Past history - sarcoidosis.  ?Interim history - Not controlled due stopping Trelegy and Singulair for about 1 month now. No recent prednisone use.  ?? Today's spirometry showed some mixed restriction and obstruction with 33% improvement in FEV1 post bronchodilator treatment. Clinically feeling improved.  ?? MAKE SURE TO TAKE YOUR TRELEGY AND SINGULAIR EVERYDAY! ?? Daily controller medication(s): RESTART Trelegy 1puff once a day and rinse mouth after each use.  Sample given. ?? Continue Tezspire injection every 4 weeks - given today. ?? Restart montelukast (Singulair) 10mg  daily at night. ?? May use levoalbuterol rescue inhaler 2 puffs every 4 to 6 hours as needed for shortness of breath, chest tightness, coughing, and wheezing. May use levoalbuterol rescue inhaler 2 puffs 5 to 15 minutes prior to strenuous physical activities. Monitor frequency of use.  ?? Get spirometry at next visit. ?

## 2022-02-10 NOTE — Patient Instructions (Addendum)
MAKE SURE TO TAKE YOUR TRELEGY AND SINGULAIR EVERYDAY! ? ?Asthma: ?Daily controller medication(s): RESTART Trelegy 224mcg 1puff once a day and rinse mouth after each use.  Sample given. ?Continue Tezspire injection every 4 weeks - given today. ?Restart montelukast (singulair) 10mg  daily at night. ?May use levoalbuterol rescue inhaler 2 puffs every 4 to 6 hours as needed for shortness of breath, chest tightness, coughing, and wheezing. May use levoalbuterol rescue inhaler 2 puffs 5 to 15 minutes prior to strenuous physical activities. Monitor frequency of use.  ?Asthma control goals:  ?Full participation in all desired activities (may need albuterol before activity) ?Albuterol use two times or less a week on average (not counting use with activity) ?Cough interfering with sleep two times or less a month ?Oral steroids no more than once a year ?No hospitalizations ? ?Perennial and seasonal allergic rhinitis ?Restart montelukast (singulair) 10mg  daily at night. ?Use over the counter antihistamines such as Zyrtec (cetirizine), Claritin (loratadine), Allegra (fexofenadine), or Xyzal (levocetirizine) daily as needed. May take twice a day during allergy flares. May switch antihistamines every few months. ?Start Ryaltris (olopatadine + mometasone nasal spray combination) 1-2 sprays per nostril twice a day. Sample given. ?Let met know if this works and I will send in a prescription for this.  ? ?Follow up in 4 months or sooner if needed.  ?

## 2022-02-11 DIAGNOSIS — J4550 Severe persistent asthma, uncomplicated: Secondary | ICD-10-CM | POA: Diagnosis not present

## 2022-02-19 ENCOUNTER — Other Ambulatory Visit (HOSPITAL_COMMUNITY): Payer: Self-pay

## 2022-02-24 ENCOUNTER — Other Ambulatory Visit (HOSPITAL_COMMUNITY): Payer: Self-pay

## 2022-03-02 ENCOUNTER — Other Ambulatory Visit (HOSPITAL_COMMUNITY): Payer: Self-pay

## 2022-03-04 ENCOUNTER — Other Ambulatory Visit (HOSPITAL_COMMUNITY): Payer: Self-pay

## 2022-03-05 DIAGNOSIS — N402 Nodular prostate without lower urinary tract symptoms: Secondary | ICD-10-CM | POA: Diagnosis not present

## 2022-03-05 LAB — PSA: PSA: 1.6

## 2022-03-10 ENCOUNTER — Ambulatory Visit (INDEPENDENT_AMBULATORY_CARE_PROVIDER_SITE_OTHER): Payer: 59

## 2022-03-10 ENCOUNTER — Other Ambulatory Visit (HOSPITAL_COMMUNITY): Payer: Self-pay

## 2022-03-10 DIAGNOSIS — J4550 Severe persistent asthma, uncomplicated: Secondary | ICD-10-CM | POA: Diagnosis not present

## 2022-03-10 DIAGNOSIS — N5201 Erectile dysfunction due to arterial insufficiency: Secondary | ICD-10-CM | POA: Diagnosis not present

## 2022-03-10 DIAGNOSIS — R972 Elevated prostate specific antigen [PSA]: Secondary | ICD-10-CM | POA: Diagnosis not present

## 2022-03-10 DIAGNOSIS — N402 Nodular prostate without lower urinary tract symptoms: Secondary | ICD-10-CM | POA: Diagnosis not present

## 2022-03-10 MED ORDER — SILDENAFIL CITRATE 100 MG PO TABS
100.0000 mg | ORAL_TABLET | ORAL | 11 refills | Status: DC
  Filled 2022-03-10: qty 30, 30d supply, fill #0
  Filled 2022-04-26: qty 30, 30d supply, fill #1
  Filled 2022-07-30: qty 30, 30d supply, fill #2
  Filled 2022-11-05: qty 30, 30d supply, fill #3
  Filled 2023-02-15: qty 30, 30d supply, fill #4

## 2022-03-29 ENCOUNTER — Other Ambulatory Visit (HOSPITAL_COMMUNITY): Payer: Self-pay

## 2022-03-31 ENCOUNTER — Other Ambulatory Visit (HOSPITAL_COMMUNITY): Payer: Self-pay

## 2022-04-07 ENCOUNTER — Ambulatory Visit (INDEPENDENT_AMBULATORY_CARE_PROVIDER_SITE_OTHER): Payer: 59

## 2022-04-07 ENCOUNTER — Other Ambulatory Visit (HOSPITAL_COMMUNITY): Payer: Self-pay

## 2022-04-07 DIAGNOSIS — J4550 Severe persistent asthma, uncomplicated: Secondary | ICD-10-CM

## 2022-04-08 ENCOUNTER — Other Ambulatory Visit (HOSPITAL_COMMUNITY): Payer: Self-pay

## 2022-04-13 ENCOUNTER — Telehealth: Payer: Self-pay | Admitting: Allergy

## 2022-04-13 NOTE — Telephone Encounter (Signed)
Called and left VM for patient to call back in the [place] office today.   If patient calls back, please let me know as I have to discuss a medical issue with patient.   Thank you.  

## 2022-04-14 NOTE — Telephone Encounter (Signed)
I contacted [name] or the patient's caregiver to inform them that he received an expired dose of Pfizer COVID-19 vaccine from our office, during their visit(s), on 09/29/2021. The dose was expired by 28 days. This was the patient's 3rd dose. I shared the following information with the patient or caregiver: vaccines given after they have expired may be less effective but we are not aware of any other adverse effects. The patient can be re-vaccinated at no cost if the patient decides to do so. Answered patient questions/concerns. Encouraged patient to reach out if they have any additional questions or concerns.  The patient decided to be re-vaccinated with bivalent Pfizer vaccine.   Will schedule appointment to receive vaccine.

## 2022-04-14 NOTE — Telephone Encounter (Signed)
I will speak with Beth regarding when we will have the vaccines in clinic then reach out to patient to schedule.  

## 2022-04-15 NOTE — Telephone Encounter (Signed)
Spoke with patient and he is scheduled to come in on Monday 04/19/2022. Patient did inform me that when he spoke with Dr. Selena Batten he informed her about his heat and air system going out. Patient is requesting a letter stating that he is at high risk for asthma flares without heat or air and that he receives monthly Tezspire for his asthma. Please advise.

## 2022-04-16 ENCOUNTER — Telehealth: Payer: Self-pay | Admitting: Allergy

## 2022-04-16 ENCOUNTER — Telehealth: Payer: Self-pay

## 2022-04-16 NOTE — Telephone Encounter (Signed)
Pt called and stated he needed a note stating he had hip surgery and was out for 4 months to recover. I completed this note and sent it to pt's mychart

## 2022-04-16 NOTE — Telephone Encounter (Signed)
Patient called and stated that he needs a letter written stating that he needs air conditioning for his asthma and breathing issues. He stated that he also needs a letter stating that he was out of work due to high risk of getting covid. He states he needs this letter to  explain why his bills were being paid late. Patient states he needs this letter by Tuesday 04/20/22. Patients call back number is [phone].

## 2022-04-16 NOTE — Telephone Encounter (Signed)
Dr. Selena Batten please advise:  Patient called and stated that he needs a letter written stating that he needs air conditioning for his asthma and breathing issues. He stated that he also needs a letter stating that he was out of work due to high risk of getting covid. He states he needs this letter to  explain why his bills were being paid late. Patient states he needs this letter by Tuesday 04/20/22.   Patients call back number is [phone].

## 2022-04-19 ENCOUNTER — Encounter: Payer: Self-pay | Admitting: Internal Medicine

## 2022-04-19 ENCOUNTER — Encounter: Payer: Self-pay | Admitting: Physician Assistant

## 2022-04-19 ENCOUNTER — Ambulatory Visit (INDEPENDENT_AMBULATORY_CARE_PROVIDER_SITE_OTHER): Payer: 59 | Admitting: Physician Assistant

## 2022-04-19 ENCOUNTER — Ambulatory Visit (INDEPENDENT_AMBULATORY_CARE_PROVIDER_SITE_OTHER): Payer: 59

## 2022-04-19 ENCOUNTER — Telehealth: Payer: Self-pay | Admitting: Physician Assistant

## 2022-04-19 DIAGNOSIS — Z23 Encounter for immunization: Secondary | ICD-10-CM

## 2022-04-19 DIAGNOSIS — M7061 Trochanteric bursitis, right hip: Secondary | ICD-10-CM | POA: Diagnosis not present

## 2022-04-19 DIAGNOSIS — M7062 Trochanteric bursitis, left hip: Secondary | ICD-10-CM

## 2022-04-19 MED ORDER — LIDOCAINE HCL 1 % IJ SOLN
3.0000 mL | INTRAMUSCULAR | Status: AC | PRN
  Administered 2022-04-19: 3 mL

## 2022-04-19 MED ORDER — METHYLPREDNISOLONE ACETATE 40 MG/ML IJ SUSP
40.0000 mg | INTRAMUSCULAR | Status: AC | PRN
  Administered 2022-04-19: 40 mg via INTRA_ARTICULAR

## 2022-04-19 NOTE — Telephone Encounter (Signed)
Pt called requesting a call back. 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 Asking for a sooner appt. Pt phone number is [phone]

## 2022-04-19 NOTE — Telephone Encounter (Signed)
Hey Ashley-This is something that Dr. Maudie Mercury who has a relationship with patient should write.  I really don't feel comfortable writing something like this for a patient who I have never met.  He will have to wait until Dr. Maudie Mercury returns. It looks like she will be back tomorrow.

## 2022-04-19 NOTE — Progress Notes (Signed)
   Covid-19 Vaccination Clinic  Name:  Kurk Corniel    MRN: [id number] DOB: July 18, 1966  04/19/2022  Mr. Groesbeck was observed post Covid-19 immunization for 15 minutes without incident. He was provided with Vaccine Information Sheet and instruction to access the V-Safe system.   Mr. Dials was instructed to call 911 with any severe reactions post vaccine: Difficulty breathing  Swelling of face and throat  A fast heartbeat  A bad rash all over body  Dizziness and weakness   Immunizations Administered     Name Date Dose VIS Date Route   Pfizer Covid-19 Vaccine Bivalent Booster 04/19/2022 11:15 AM 0.3 mL 06/03/2021 Intramuscular   Manufacturer: [employer], [employer]   Lot: [id number]   NDC: [phone]

## 2022-04-19 NOTE — Progress Notes (Signed)
   Procedure Note  Patient: [name]             Date of Birth: 1966-01-22           MRN: [id number]             Visit Date: 04/19/2022  HPI: [name] comes in today due to bilateral hip pain.  He also is having some low back pain but denies any numbness tingling down either leg.  Most of the pain is lateral aspect of both hips.  He does have a history of a right total hip arthroplasty.  Said no new injury to either hip.  Pains worse with sitting for long period of time.  Long walks are also painful.  He has difficulty lying on either hip due to the pain.  Review of systems see HPI otherwise negative  Physical exam: General well-developed well-nourished male no acute distress ambulates without any assistive device.  Bilateral hips: Good range of motion both hips without pain.  Negative straight leg raise bilaterally.  Tenderness over the trochanteric region of both hips right greater than left.  Procedures: Visit Diagnoses:  1. Trochanteric bursitis, right hip   2. Trochanteric bursitis, left hip     Large Joint Inj: bilateral greater trochanter on 04/19/2022 12:12 PM Indications: pain Details: 22 G 1.5 in needle, lateral approach  Arthrogram: No  Medications (Right): 3 mL lidocaine 1 %; 40 mg methylPREDNISolone acetate 40 MG/ML Medications (Left): 3 mL lidocaine 1 %; 40 mg methylPREDNISolone acetate 40 MG/ML Outcome: tolerated well, no immediate complications Procedure, treatment alternatives, risks and benefits explained, specific risks discussed. Consent was given by the patient. Immediately prior to procedure a time out was called to verify the correct patient, procedure, equipment, support staff and site/side marked as required. Patient was prepped and draped in the usual sterile fashion.     Plan: He shown IT band stretching exercises.  Follow-up with [place] pain persist or becomes worse.  Questions were encouraged and answered

## 2022-04-19 NOTE — Telephone Encounter (Signed)
Worked pt in to see Bronson Curb this morning

## 2022-04-19 NOTE — Telephone Encounter (Signed)
See other telephone encounter.

## 2022-04-19 NOTE — Telephone Encounter (Signed)
I was out of office until 7/18. I will write letter tomorrow. Does he want to pick it up or does he want [place] to mail it?  Thank you.

## 2022-04-19 NOTE — Telephone Encounter (Signed)
Dr. Maurine Minister, Dr. Selena Batten is out of the office.  Morrie Sheldon asked me to forward this to you.  Is this something you are able to write for this patient?  Thank you!!!!

## 2022-04-20 NOTE — Telephone Encounter (Signed)
Spoke with pt and informed him letter was ready for him. He wanted it faxed to [place] to pick up so I informed carla at [facility] about me faxing the letter for him to [place] and also he wanted one mailed to his home so I have placed one in the out going mail

## 2022-04-22 ENCOUNTER — Ambulatory Visit: Payer: 59 | Admitting: Physician Assistant

## 2022-04-26 ENCOUNTER — Other Ambulatory Visit (HOSPITAL_COMMUNITY): Payer: Self-pay

## 2022-04-29 ENCOUNTER — Other Ambulatory Visit (HOSPITAL_COMMUNITY): Payer: Self-pay

## 2022-05-03 ENCOUNTER — Ambulatory Visit (INDEPENDENT_AMBULATORY_CARE_PROVIDER_SITE_OTHER): Payer: 59

## 2022-05-03 DIAGNOSIS — J4550 Severe persistent asthma, uncomplicated: Secondary | ICD-10-CM

## 2022-05-05 ENCOUNTER — Ambulatory Visit: Payer: 59

## 2022-05-13 ENCOUNTER — Other Ambulatory Visit: Payer: Self-pay | Admitting: Physician Assistant

## 2022-05-13 ENCOUNTER — Other Ambulatory Visit (HOSPITAL_COMMUNITY): Payer: Self-pay

## 2022-05-13 ENCOUNTER — Telehealth: Payer: Self-pay | Admitting: Physician Assistant

## 2022-05-13 ENCOUNTER — Other Ambulatory Visit: Payer: Self-pay | Admitting: Orthopaedic Surgery

## 2022-05-13 MED ORDER — DICLOFENAC SODIUM 1 % EX GEL
CUTANEOUS | 2 refills | Status: DC
  Filled 2022-05-13: qty 300, 19d supply, fill #0

## 2022-05-13 MED ORDER — DICLOFENAC SODIUM 1 % EX GEL
CUTANEOUS | 2 refills | Status: DC
  Filled 2022-05-13: qty 300, fill #0
  Filled 2023-03-30: qty 200, 10d supply, fill #0

## 2022-05-13 NOTE — Telephone Encounter (Signed)
Pt called requesting a refill of voltarin cream. Please send to pharmacy on file. Pt phone number is [phone]. Pt states pharmacy sent in request

## 2022-05-13 NOTE — Telephone Encounter (Signed)
Is this ok?

## 2022-05-14 ENCOUNTER — Other Ambulatory Visit (HOSPITAL_COMMUNITY): Payer: Self-pay

## 2022-05-20 ENCOUNTER — Other Ambulatory Visit (HOSPITAL_COMMUNITY): Payer: Self-pay

## 2022-05-24 ENCOUNTER — Other Ambulatory Visit (HOSPITAL_COMMUNITY): Payer: Self-pay

## 2022-05-26 ENCOUNTER — Other Ambulatory Visit (HOSPITAL_COMMUNITY): Payer: Self-pay

## 2022-05-26 ENCOUNTER — Encounter: Payer: 59 | Admitting: Internal Medicine

## 2022-05-27 ENCOUNTER — Other Ambulatory Visit (HOSPITAL_COMMUNITY): Payer: Self-pay

## 2022-05-31 ENCOUNTER — Ambulatory Visit (INDEPENDENT_AMBULATORY_CARE_PROVIDER_SITE_OTHER): Payer: 59

## 2022-05-31 DIAGNOSIS — J4550 Severe persistent asthma, uncomplicated: Secondary | ICD-10-CM | POA: Diagnosis not present

## 2022-06-01 ENCOUNTER — Other Ambulatory Visit (HOSPITAL_COMMUNITY): Payer: Self-pay

## 2022-06-13 NOTE — Progress Notes (Unsigned)
Follow Up Note  RE: [name] MRN: [id number] DOB: 06-08-1966 Date of Office Visit: 06/14/2022  Referring provider: Janith Lima, MD Primary care provider: Janith Lima, MD  Chief Complaint: No chief complaint on file.  History of Present Illness: I had the pleasure of seeing [name] for a follow up visit at the [facility] on 06/13/2022. He is a 56 y.o. male, who is being followed for asthma on Tezspire, allergic rhinitis. His previous allergy office visit was on 02/10/2022 with Dr. Maudie Mercury. Today is a regular follow up visit.  Severe persistent asthma without complication Past history - sarcoidosis.  Interim history - Not controlled due stopping Trelegy and Singulair for about 1 month now. No recent prednisone use.  Today's spirometry showed some mixed restriction and obstruction with 33% improvement in FEV1 post bronchodilator treatment. Clinically feeling improved.  MAKE SURE TO TAKE YOUR TRELEGY AND SINGULAIR EVERYDAY! Daily controller medication(s): RESTART Trelegy 2104mcg 1puff once a day and rinse mouth after each use.  Sample given. Continue Tezspire injection every 4 weeks - given today. Restart montelukast (Singulair) 10mg  daily at night. May use levoalbuterol rescue inhaler 2 puffs every 4 to 6 hours as needed for shortness of breath, chest tightness, coughing, and wheezing. May use levoalbuterol rescue inhaler 2 puffs 5 to 15 minutes prior to strenuous physical activities. Monitor frequency of use.  Get spirometry at next visit.   Perennial and seasonal allergic rhinitis Past history - 2019 skin testing was positive to grass, weed, ragweed, trees, mold, dust mites, cockroach, cat and dog.  Interim history - increased rhinitis symptoms but does not take any meds. Restart montelukast (Singulair) 10mg  daily at night. Use over the counter antihistamines such as Zyrtec (cetirizine), Claritin (loratadine), Allegra (fexofenadine), or Xyzal  (levocetirizine) daily as needed. May take twice a day during allergy flares. May switch antihistamines every few months. Start Ryaltris (olopatadine + mometasone nasal spray combination) 1-2 sprays per nostril twice a day. Sample given. Let met know if this works and I will send in a prescription for this.    Return in about 4 months (around 06/13/2022).  Assessment and Plan: [name] is a 56 y.o. male with: No problem-specific Assessment & Plan notes found for this encounter.  No follow-ups on file.  No orders of the defined types were placed in this encounter.  Lab Orders  No laboratory test(s) ordered today    Diagnostics: Spirometry:  Tracings reviewed. His effort: {Blank single:19197::"Good reproducible efforts.","It was hard to get consistent efforts and there is a question as to whether this reflects a maximal maneuver.","Poor effort, data can not be interpreted."} FVC: ***L FEV1: ***L, ***% predicted FEV1/FVC ratio: ***% Interpretation: {Blank single:19197::"Spirometry consistent with mild obstructive disease","Spirometry consistent with moderate obstructive disease","Spirometry consistent with severe obstructive disease","Spirometry consistent with possible restrictive disease","Spirometry consistent with mixed obstructive and restrictive disease","Spirometry uninterpretable due to technique","Spirometry consistent with normal pattern","No overt abnormalities noted given today's efforts"}.  Please see scanned spirometry results for details.  Skin Testing: {Blank single:19197::"Select foods","Environmental allergy panel","Environmental allergy panel and select foods","Food allergy panel","None","Deferred due to recent antihistamines use"}. *** Results discussed with patient/family.   Medication List:  Current Outpatient Medications  Medication Sig Dispense Refill   ciclopirox (PENLAC) 8 % solution Apply topically at bedtime. Apply over nail and surrounding skin. Apply daily  over previous coat. After 7 days, may remove with alcohol and continue cycle. 6.6 mL 1   clotrimazole-betamethasone (LOTRISONE) cream Apply to affected area 2 times  daily. 30 g 0   diclofenac Sodium (VOLTAREN) 1 % GEL APPLY 2 TO 4 GRAMS TO AFFECTED AREA(S) 4 TIMES DAILY 300 g 2   EPINEPHrine 0.3 mg/0.3 mL IJ SOAJ injection Inject 0.3 mg into the muscle as needed for anaphylaxis.   2   fluconazole (DIFLUCAN) 150 MG tablet Take 1 tablet by mouth once a week. 4 tablet 2   Fluticasone-Umeclidin-Vilant (TRELEGY ELLIPTA) 200-62.5-25 MCG/ACT AEPB Inhale 1 puff into the lungs daily. Rinse mouth after each use. 60 each 5   ibuprofen (ADVIL) 800 MG tablet TAKE 1 TABLET BY MOUTH EVERY 8 HOURS AS NEEDED 90 tablet 2   levalbuterol (XOPENEX HFA) 45 MCG/ACT inhaler Inhale 2 puffs into the lungs every 4 (four) hours as needed for wheezing or shortness of breath (coughing fits). 15 g 1   levalbuterol (XOPENEX) 0.63 MG/3ML nebulizer solution Take 3 mLs (0.63 mg total) by nebulization every 8 (eight) hours as needed for wheezing or shortness of breath. 150 mL 3   meloxicam (MOBIC) 15 MG tablet Take 1 tablet (15 mg total) by mouth daily. 30 tablet 0   montelukast (SINGULAIR) 10 MG tablet Take 1 tablet (10 mg total) by mouth at bedtime. 30 tablet 5   nebivolol (BYSTOLIC) 5 MG tablet Take 1 tablet (5 mg total) by mouth daily. 90 tablet 1   nitroGLYCERIN (NITRODUR - DOSED IN MG/24 HR) 0.2 mg/hr patch Apply 1/4 patch daily to tendon for tendonitis. 30 patch 1   Olopatadine-Mometasone (RYALTRIS) 665-25 MCG/ACT SUSP Place 1-2 sprays into the nose in the morning and at bedtime. 29 g 0   sildenafil (VIAGRA) 100 MG tablet Take 1 tablet (100 mg total) by mouth as directed as needed 30 tablet 11   tezepelumab-ekko (TEZSPIRE) 210 MG/1. syringe Inject 1.91 mLs (210 mg total) into the skin once for 1 dose. 1.91 mL 11   Current Facility-Administered Medications  Medication Dose Route Frequency Provider Last Rate Last Admin    tezepelumab-ekko (TEZSPIRE) 210 MG/1. syringe 210 mg  210 mg Subcutaneous Q28 days Ellamae Sia, DO   210 mg at 05/31/22 1210   triamcinolone acetonide (KENALOG) 10 MG/ML injection 10 mg  10 mg Other Once Vivi Barrack, DPM       Allergies: No Known Allergies I reviewed his past medical history, social history, family history, and environmental history and no significant changes have been reported from his previous visit.  Review of Systems  Constitutional:  Negative for appetite change, chills, fever and unexpected weight change.  HENT:  Positive for congestion, rhinorrhea and sneezing. Negative for ear pain.   Eyes:  Negative for itching.  Respiratory:  Positive for shortness of breath and wheezing. Negative for cough and chest tightness.   Gastrointestinal:  Negative for abdominal pain.  Skin:  Negative for rash.  Allergic/Immunologic: Positive for environmental allergies.  Neurological:  Negative for headaches.    Objective: There were no vitals taken for this visit. There is no height or weight on file to calculate BMI. Physical Exam Vitals and nursing note reviewed.  Constitutional:      Appearance: Normal appearance. He is well-developed.  HENT:     Head: Normocephalic and atraumatic.     Right Ear: Tympanic membrane and external ear normal.     Left Ear: Tympanic membrane and external ear normal.     Nose: Nose normal.     Mouth/Throat:     Mouth: Mucous membranes are moist.     Pharynx: Oropharynx is clear.  Eyes:     Conjunctiva/sclera: Conjunctivae normal.  Cardiovascular:     Rate and Rhythm: Normal rate and regular rhythm.     Heart sounds: Normal heart sounds. No murmur heard. Pulmonary:     Effort: Pulmonary effort is normal.     Breath sounds: Normal breath sounds. No wheezing, rhonchi or rales.     Comments: Improved airflow post bronchodilator treatment. Musculoskeletal:     Cervical back: Neck supple.  Skin:    General: Skin is warm.      Findings: No rash.  Neurological:     Mental Status: He is alert and oriented to person, place, and time.  Psychiatric:        Behavior: Behavior normal.    Previous notes and tests were reviewed. The plan was reviewed with the patient/family, and all questions/concerned were addressed.  It was my pleasure to see [name] today and participate in his care. Please feel free to contact me with any questions or concerns.  Sincerely,  Rexene Alberts, DO Allergy & Immunology  [facility] office: [place] office: [phone]

## 2022-06-14 ENCOUNTER — Ambulatory Visit: Payer: 59 | Admitting: Allergy

## 2022-06-14 ENCOUNTER — Other Ambulatory Visit (HOSPITAL_COMMUNITY): Payer: Self-pay

## 2022-06-14 ENCOUNTER — Encounter: Payer: Self-pay | Admitting: Allergy

## 2022-06-14 VITALS — BP 120/72 | HR 97 | Temp 97.8°F | Resp 18 | Ht 71.0 in | Wt 156.4 lb

## 2022-06-14 DIAGNOSIS — J4550 Severe persistent asthma, uncomplicated: Secondary | ICD-10-CM

## 2022-06-14 DIAGNOSIS — J3089 Other allergic rhinitis: Secondary | ICD-10-CM | POA: Diagnosis not present

## 2022-06-14 MED ORDER — RYALTRIS 665-25 MCG/ACT NA SUSP: 1.0000 | Freq: Two times a day (BID) | NASAL | 5 refills | Status: DC

## 2022-06-14 MED ORDER — TRELEGY ELLIPTA 200-62.5-25 MCG/ACT IN AEPB
1.0000 | INHALATION_SPRAY | Freq: Every day | RESPIRATORY_TRACT | 5 refills | Status: DC
  Filled 2022-06-14: qty 60, 30d supply, fill #0

## 2022-06-14 NOTE — Assessment & Plan Note (Signed)
Past history - 2019 skin testing was positive to grass, weed, ragweed, trees, mold, dust mites, cockroach, cat and dog.  Interim history - controlled.  . Continue montelukast (singulair) 10mg  daily at night. . Use over the counter antihistamines such as Zyrtec (cetirizine), Claritin (loratadine), Allegra (fexofenadine), or Xyzal (levocetirizine) daily as needed. May take twice a day during allergy flares. May switch antihistamines every few months. . Start Ryaltris (olopatadine + mometasone nasal spray combination) 1-2 sprays per nostril twice a day. Sample given. . This replaces your other nasal sprays. . If this works well for you, then have [facility] ship the medication to your home - prescription already sent in.

## 2022-06-14 NOTE — Patient Instructions (Addendum)
MAKE SURE TO TAKE YOUR TRELEGY AND SINGULAIR EVERYDAY!  Asthma: Daily controller medication(s): Trelegy 1puff once a day and rinse mouth after each use. Sample given. Continue Tezspire injection every 4 weeks.  Continue montelukast (singulair) 10mg  daily at night. May use levoalbuterol rescue inhaler 2 puffs every 4 to 6 hours as needed for shortness of breath, chest tightness, coughing, and wheezing. May use levoalbuterol rescue inhaler 2 puffs 5 to 15 minutes prior to strenuous physical activities. Monitor frequency of use.  Asthma control goals:  Full participation in all desired activities (may need albuterol before activity) Albuterol use two times or less a week on average (not counting use with activity) Cough interfering with sleep two times or less a month Oral steroids no more than once a year No hospitalizations  Perennial and seasonal allergic rhinitis Continue montelukast (singulair) 10mg  daily at night. Use over the counter antihistamines such as Zyrtec (cetirizine), Claritin (loratadine), Allegra (fexofenadine), or Xyzal (levocetirizine) daily as needed. May take twice a day during allergy flares. May switch antihistamines every few months. Start Ryaltris (olopatadine + mometasone nasal spray combination) 1-2 sprays per nostril twice a day. Sample given. This replaces your other nasal sprays. If this works well for you, then have [facility] ship the medication to your home - prescription already sent in.   Follow up in 4 months or sooner if needed. Get flu shot in the fall.   Our [place] office is moving in September 2023 to a new location. New address: [address] [place], [place], [place] (white building). [place] office: [phone] (same phone number).

## 2022-06-14 NOTE — Assessment & Plan Note (Signed)
Past history - sarcoidosis.  Interim history - asymptomatic and doing well with below regimen.  Today's spirometry showed some mixed restriction and obstruction - improved from previous one.   MAKE SURE TO TAKE YOUR TRELEGY AND SINGULAIR EVERYDAY! . Daily controller medication(s): Trelegy 1puff once a day and rinse mouth after each use. Sample given. . Continue Tezspire injection every 4 weeks.  . Continue montelukast (singulair) 10mg  daily at night. . May use levoalbuterol rescue inhaler 2 puffs every 4 to 6 hours as needed for shortness of breath, chest tightness, coughing, and wheezing. May use levoalbuterol rescue inhaler 2 puffs 5 to 15 minutes prior to strenuous physical activities. Monitor frequency of use.  . Get spirometry at next visit.

## 2022-06-15 ENCOUNTER — Other Ambulatory Visit (HOSPITAL_COMMUNITY): Payer: Self-pay

## 2022-06-17 ENCOUNTER — Other Ambulatory Visit (HOSPITAL_COMMUNITY): Payer: Self-pay

## 2022-06-21 ENCOUNTER — Ambulatory Visit: Payer: 59 | Attending: Internal Medicine | Admitting: Pharmacist

## 2022-06-21 ENCOUNTER — Other Ambulatory Visit (HOSPITAL_COMMUNITY): Payer: Self-pay

## 2022-06-21 ENCOUNTER — Other Ambulatory Visit: Payer: Self-pay | Admitting: Allergy

## 2022-06-21 DIAGNOSIS — Z79899 Other long term (current) drug therapy: Secondary | ICD-10-CM

## 2022-06-21 MED ORDER — TEZSPIRE 210 MG/1.91ML ~~LOC~~ SOSY
210.0000 mg | PREFILLED_SYRINGE | SUBCUTANEOUS | 11 refills | Status: DC
  Filled 2022-06-21: qty 1.91, 28d supply, fill #0
  Filled 2022-07-19: qty 1.91, 28d supply, fill #1
  Filled 2022-08-16: qty 1.91, 28d supply, fill #2

## 2022-06-21 MED ORDER — TEZSPIRE 210 MG/1.91ML ~~LOC~~ SOSY
210.0000 mg | PREFILLED_SYRINGE | Freq: Once | SUBCUTANEOUS | 11 refills | Status: DC
  Filled 2022-06-21: qty 1.91, 1d supply, fill #0

## 2022-06-21 NOTE — Progress Notes (Signed)
   S: Patient presents today for review of their specialty medication.   Patient is taking Tezspire for asthma. Patient is managed by Dr. Maudie Mercury for this.   Dosing: 210 mg subq q4wks   Adherence: confirmed   Efficacy: reports good efficacy   Monitoring:  S/Sx of hypersensitivity: none PFTs: monitored by Dr. Julianne Rice office S/sx of infection: none    Current adverse effects: none reported   O:     Lab Results  Component Value Date   WBC 4.2 12/04/2021   HGB 16.4 12/04/2021   HCT 44.7 12/04/2021   MCV 79.3 (L) 12/04/2021   PLT 282 12/04/2021      Chemistry      Component Value Date/Time   NA 136 12/04/2021 0955   K 3.8 12/04/2021 0955   CL 100 12/04/2021 0955   CO2 25 12/04/2021 0955   BUN 15 12/04/2021 0955   CREATININE 0.84 12/04/2021 0955   CREATININE 0.98 04/18/2013 1034      Component Value Date/Time   CALCIUM 9.0 12/04/2021 0955   ALKPHOS 53 12/04/2021 0955   AST 29 12/04/2021 0955   ALT 28 12/04/2021 0955   BILITOT 0.7 12/04/2021 0955       A/P: 1. Medication review: patient is taking Tezspire for asthma with good efficacy. He is tolerating the Tezspire well. We discussed the medication including the following: Tezepelumab-ekko is a human monoclonal antibody IgG2? that binds to human thymic stromal lymphopoietin (TSLP), an epithelial cytokine, and prevents human TSLP from interacting with the heterodimeric TSLP receptor. Blocking TSLP with tezepelumab-ekko reduces biomarkers and cytokines associated with inflammation including blood eosinophils, airway submucosal eosinophils, IgE, FeNO, IL-5, and IL-13; however, the mechanism of tezepelumab-ekko action in asthma has not been definitively established. Prior to administration, remove the vial or pre-filled syringe from the refrigerator and allow to reach room temperature for ~60 minutes (do not shake or expose to heat). Administer as a SUBQ injection into the upper arm, thigh, or lower abdomen (avoiding areas within  2 inches of navel). Rotate injection sites; do not inject into areas where the skin is tender, bruised, erythematous, or hardened. Adverse effects are uncommon but may include antibody development, joint pain, back pain, or pharyngitis. Patients should monitor for hypersensitivity reactions and infections. All questions and concerns were addressed. No recommendations for any changes at this time.   Benard Halsted, PharmD, [facility] [place], [place] [phone]

## 2022-06-23 ENCOUNTER — Other Ambulatory Visit (HOSPITAL_COMMUNITY): Payer: Self-pay

## 2022-06-28 ENCOUNTER — Ambulatory Visit: Payer: 59

## 2022-07-02 ENCOUNTER — Ambulatory Visit (INDEPENDENT_AMBULATORY_CARE_PROVIDER_SITE_OTHER): Payer: 59

## 2022-07-02 DIAGNOSIS — J4550 Severe persistent asthma, uncomplicated: Secondary | ICD-10-CM

## 2022-07-02 MED ORDER — TEZEPELUMAB-EKKO 210 MG/1.91ML ~~LOC~~ SOSY
210.0000 mg | PREFILLED_SYRINGE | SUBCUTANEOUS | Status: AC
  Administered 2022-07-02 – 2024-10-23 (×31): 210 mg via SUBCUTANEOUS

## 2022-07-19 ENCOUNTER — Other Ambulatory Visit (HOSPITAL_COMMUNITY): Payer: Self-pay

## 2022-07-22 ENCOUNTER — Other Ambulatory Visit (HOSPITAL_COMMUNITY): Payer: Self-pay

## 2022-07-23 ENCOUNTER — Other Ambulatory Visit (HOSPITAL_COMMUNITY): Payer: Self-pay

## 2022-07-29 ENCOUNTER — Ambulatory Visit (INDEPENDENT_AMBULATORY_CARE_PROVIDER_SITE_OTHER): Payer: 59

## 2022-07-29 DIAGNOSIS — J4550 Severe persistent asthma, uncomplicated: Secondary | ICD-10-CM

## 2022-07-30 ENCOUNTER — Ambulatory Visit: Payer: 59

## 2022-07-30 ENCOUNTER — Other Ambulatory Visit (HOSPITAL_COMMUNITY): Payer: Self-pay

## 2022-07-30 ENCOUNTER — Telehealth: Payer: Self-pay | Admitting: Internal Medicine

## 2022-07-30 NOTE — Telephone Encounter (Signed)
PT visits with life insurance forms to be filled out by Dr.Jones. They are currently in an envelope with PT's name on them, sitting in Dr.Jones' mailbox.   PT is requesting these be mailed out, as well as a completed copy be made for PT to pick up.  CB for PT: [phone]  Alternate CB: [phone]

## 2022-08-06 ENCOUNTER — Other Ambulatory Visit: Payer: Self-pay | Admitting: Podiatry

## 2022-08-06 ENCOUNTER — Other Ambulatory Visit (HOSPITAL_COMMUNITY): Payer: Self-pay

## 2022-08-06 DIAGNOSIS — Z0289 Encounter for other administrative examinations: Secondary | ICD-10-CM

## 2022-08-06 NOTE — Telephone Encounter (Signed)
Pt called to check status of his life insurance forms he left for provider to fill out on 07/30/22. Pt states to mail completed forms to agency and a copy for him can be mailed to his home address.  Call pt at [phone] or at work [phone]

## 2022-08-06 NOTE — Telephone Encounter (Signed)
PCP has form. He has completed it. It has been sent to [facility]. Copy sent to pt per pt request.

## 2022-08-09 ENCOUNTER — Other Ambulatory Visit (HOSPITAL_COMMUNITY): Payer: Self-pay

## 2022-08-16 ENCOUNTER — Other Ambulatory Visit (HOSPITAL_COMMUNITY): Payer: Self-pay

## 2022-08-18 ENCOUNTER — Other Ambulatory Visit (HOSPITAL_COMMUNITY): Payer: Self-pay

## 2022-08-25 ENCOUNTER — Ambulatory Visit (INDEPENDENT_AMBULATORY_CARE_PROVIDER_SITE_OTHER): Payer: 59 | Admitting: *Deleted

## 2022-08-25 ENCOUNTER — Other Ambulatory Visit (HOSPITAL_COMMUNITY): Payer: Self-pay

## 2022-08-25 DIAGNOSIS — J4550 Severe persistent asthma, uncomplicated: Secondary | ICD-10-CM

## 2022-08-30 ENCOUNTER — Ambulatory Visit (INDEPENDENT_AMBULATORY_CARE_PROVIDER_SITE_OTHER): Payer: 59 | Admitting: Internal Medicine

## 2022-08-30 ENCOUNTER — Encounter: Payer: Self-pay | Admitting: Internal Medicine

## 2022-08-30 VITALS — BP 128/82 | HR 99 | Temp 98.2°F | Resp 16 | Ht 71.0 in | Wt 158.0 lb

## 2022-08-30 DIAGNOSIS — D869 Sarcoidosis, unspecified: Secondary | ICD-10-CM | POA: Diagnosis not present

## 2022-08-30 DIAGNOSIS — Z0000 Encounter for general adult medical examination without abnormal findings: Secondary | ICD-10-CM | POA: Diagnosis not present

## 2022-08-30 DIAGNOSIS — K08429 Partial loss of teeth due to periodontal diseases, unspecified class: Secondary | ICD-10-CM | POA: Insufficient documentation

## 2022-08-30 DIAGNOSIS — R739 Hyperglycemia, unspecified: Secondary | ICD-10-CM | POA: Diagnosis not present

## 2022-08-30 DIAGNOSIS — I10 Essential (primary) hypertension: Secondary | ICD-10-CM | POA: Diagnosis not present

## 2022-08-30 DIAGNOSIS — Z23 Encounter for immunization: Secondary | ICD-10-CM

## 2022-08-30 DIAGNOSIS — Z1211 Encounter for screening for malignant neoplasm of colon: Secondary | ICD-10-CM

## 2022-08-30 NOTE — Patient Instructions (Signed)
Health Maintenance, Male Adopting a healthy lifestyle and getting preventive care are important in promoting health and wellness. Ask your health care provider about: The right schedule for you to have regular tests and exams. Things you can do on your own to prevent diseases and keep yourself healthy. What should I know about diet, weight, and exercise? Eat a healthy diet  Eat a diet that includes plenty of vegetables, fruits, low-fat dairy products, and lean protein. Do not eat a lot of foods that are high in solid fats, added sugars, or sodium. Maintain a healthy weight Body mass index (BMI) is a measurement that can be used to identify possible weight problems. It estimates body fat based on height and weight. Your health care provider can help determine your BMI and help you achieve or maintain a healthy weight. Get regular exercise Get regular exercise. This is one of the most important things you can do for your health. Most adults should: Exercise for at least 150 minutes each week. The exercise should increase your heart rate and make you sweat (moderate-intensity exercise). Do strengthening exercises at least twice a week. This is in addition to the moderate-intensity exercise. Spend less time sitting. Even light physical activity can be beneficial. Watch cholesterol and blood lipids Have your blood tested for lipids and cholesterol at 54 years of age, then have this test every 5 years. You may need to have your cholesterol levels checked more often if: Your lipid or cholesterol levels are high. You are older than 54 years of age. You are at high risk for heart disease. What should I know about cancer screening? Many types of cancers can be detected early and may often be prevented. Depending on your health history and family history, you may need to have cancer screening at various ages. This may include screening for: Colorectal cancer. Prostate cancer. Skin cancer. Lung  cancer. What should I know about heart disease, diabetes, and high blood pressure? Blood pressure and heart disease High blood pressure causes heart disease and increases the risk of stroke. This is more likely to develop in people who have high blood pressure readings or are overweight. Talk with your health care provider about your target blood pressure readings. Have your blood pressure checked: Every 3-5 years if you are 18-39 years of age. Every year if you are 40 years old or older. If you are between the ages of 65 and 75 and are a current or former smoker, ask your health care provider if you should have a one-time screening for abdominal aortic aneurysm (AAA). Diabetes Have regular diabetes screenings. This checks your fasting blood sugar level. Have the screening done: Once every three years after age 45 if you are at a normal weight and have a low risk for diabetes. More often and at a younger age if you are overweight or have a high risk for diabetes. What should I know about preventing infection? Hepatitis B If you have a higher risk for hepatitis B, you should be screened for this virus. Talk with your health care provider to find out if you are at risk for hepatitis B infection. Hepatitis C Blood testing is recommended for: Everyone born from 1945 through 1965. Anyone with known risk factors for hepatitis C. Sexually transmitted infections (STIs) You should be screened each year for STIs, including gonorrhea and chlamydia, if: You are sexually active and are younger than 54 years of age. You are older than 54 years of age and your   health care provider tells you that you are at risk for this type of infection. Your sexual activity has changed since you were last screened, and you are at increased risk for chlamydia or gonorrhea. Ask your health care provider if you are at risk. Ask your health care provider about whether you are at high risk for HIV. Your health care provider  may recommend a prescription medicine to help prevent HIV infection. If you choose to take medicine to prevent HIV, you should first get tested for HIV. You should then be tested every 3 months for as long as you are taking the medicine. Follow these instructions at home: Alcohol use Do not drink alcohol if your health care provider tells you not to drink. If you drink alcohol: Limit how much you have to 0-2 drinks a day. Know how much alcohol is in your drink. In the [place] one drink equals one 12 oz bottle of beer (355 mL), one 5 oz glass of wine (148 mL), or one 1 oz glass of hard liquor (44 mL). Lifestyle Do not use any products that contain nicotine or tobacco. These products include cigarettes, chewing tobacco, and vaping devices, such as e-cigarettes. If you need help quitting, ask your health care provider. Do not use street drugs. Do not share needles. Ask your health care provider for help if you need support or information about quitting drugs. General instructions Schedule regular health, dental, and eye exams. Stay current with your vaccines. Tell your health care provider if: You often feel depressed. You have ever been abused or do not feel safe at home. Summary Adopting a healthy lifestyle and getting preventive care are important in promoting health and wellness. Follow your health care provider's instructions about healthy diet, exercising, and getting tested or screened for diseases. Follow your health care provider's instructions on monitoring your cholesterol and blood pressure. This information is not intended to replace advice given to you by your health care provider. Make sure you discuss any questions you have with your health care provider. Document Revised: 02/09/2021 Document Reviewed: 02/09/2021 Elsevier Patient Education  2023 [employer]  

## 2022-08-30 NOTE — Progress Notes (Signed)
Subjective:  Patient ID: [name], male    DOB: May 14, 1966  Age: 56 y.o. MRN: [id number]  CC: Annual Exam   HPI [name] presents for a CPX and f/up -   He complains of a 1 year history of loosening teeth on the lower midline.  His breathing symptoms have improved.  He denies cough, hemoptysis, chest pain.  According to prescription refills he is not taking any antihypertensives.  Outpatient Medications Prior to Visit  Medication Sig Dispense Refill   ciclopirox (PENLAC) 8 % solution Apply topically at bedtime. Apply over nail and surrounding skin. Apply daily over previous coat. After 7 days, may remove with alcohol and continue cycle. 6.6 mL 1   clotrimazole-betamethasone (LOTRISONE) cream Apply to affected area 2 times daily. 30 g 0   diclofenac Sodium (VOLTAREN) 1 % GEL APPLY 2 TO 4 GRAMS TO AFFECTED AREA(S) 4 TIMES DAILY 300 g 2   EPINEPHrine 0.3 mg/0.3 mL IJ SOAJ injection Inject 0.3 mg into the muscle as needed for anaphylaxis.   2   Fluticasone-Umeclidin-Vilant (TRELEGY ELLIPTA) 200-62.5-25 MCG/ACT AEPB Inhale 1 puff into the lungs daily. Rinse mouth after each use. 60 each 5   levalbuterol (XOPENEX HFA) 45 MCG/ACT inhaler Inhale 2 puffs into the lungs every 4 (four) hours as needed for wheezing or shortness of breath (coughing fits). 15 g 1   levalbuterol (XOPENEX) 0.63 MG/3ML nebulizer solution Take 3 mLs (0.63 mg total) by nebulization every 8 (eight) hours as needed for wheezing or shortness of breath. 150 mL 3   montelukast (SINGULAIR) 10 MG tablet Take 1 tablet (10 mg total) by mouth at bedtime. 30 tablet 5   nitroGLYCERIN (NITRODUR - DOSED IN MG/24 HR) 0.2 mg/hr patch Apply 1/4 patch daily to tendon for tendonitis. 30 patch 1   Olopatadine-Mometasone (RYALTRIS) 665-25 MCG/ACT SUSP Place 1-2 sprays into the nose in the morning and at bedtime. 29 g 5   sildenafil (VIAGRA) 100 MG tablet Take 1 tablet (100 mg total) by mouth as directed as needed 30 tablet 11    meloxicam (MOBIC) 15 MG tablet Take 1 tablet (15 mg total) by mouth daily. 30 tablet 0   nebivolol (BYSTOLIC) 5 MG tablet Take 1 tablet (5 mg total) by mouth daily. 90 tablet 1   tezepelumab-ekko (TEZSPIRE) 210 MG/1. syringe Inject 1.91 mLs (210 mg total) into the skin every 28 (twenty-eight) days. 1.91 mL 11   Facility-Administered Medications Prior to Visit  Medication Dose Route Frequency Provider Last Rate Last Admin   tezepelumab-ekko (TEZSPIRE) 210 MG/1. syringe 210 mg  210 mg Subcutaneous Q28 days Marcelyn Bruins, MD   210 mg at 08/25/22 0924   tezepelumab-ekko (TEZSPIRE) 210 MG/1. syringe 210 mg  210 mg Subcutaneous Q28 days Ellamae Sia, DO   210 mg at 05/31/22 1210   triamcinolone acetonide (KENALOG) 10 MG/ML injection 10 mg  10 mg Other Once Vivi Barrack, DPM        ROS Review of Systems  Constitutional: Negative.  Negative for chills and fatigue.  HENT: Negative.    Respiratory:  Positive for shortness of breath and wheezing. Negative for cough, chest tightness and stridor.   Cardiovascular:  Negative for chest pain, palpitations and leg swelling.  Gastrointestinal:  Negative for abdominal pain, constipation, diarrhea, nausea and vomiting.  Genitourinary: Negative.  Negative for difficulty urinating and dysuria.  Musculoskeletal:  Negative for arthralgias and myalgias.  Skin: Negative.  Negative for color change and pallor.  Neurological:  Negative  for dizziness, weakness and light-headedness.  Hematological:  Negative for adenopathy. Does not bruise/bleed easily.  Psychiatric/Behavioral: Negative.      Objective:  BP 128/82 (BP Location: Right Arm, Patient Position: Sitting, Cuff Size: Large)   Pulse 99   Temp 98.2 F (36.8 C) (Oral)   Resp 16   Ht 5\' 11"  (1.803 m)   Wt 158 lb (71.7 kg)   SpO2 95%   BMI 22.04 kg/m   BP Readings from Last 3 Encounters:  08/30/22 128/82  06/14/22 120/72  02/10/22 126/80    Wt Readings from Last 3  Encounters:  08/30/22 158 lb (71.7 kg)  06/14/22 156 lb 6.4 oz (70.9 kg)  02/10/22 156 lb 2 oz (70.8 kg)    Physical Exam Vitals reviewed.  Constitutional:      Appearance: He is not ill-appearing.  HENT:     Mouth/Throat:     Mouth: Mucous membranes are moist.  Eyes:     General: No scleral icterus.    Conjunctiva/sclera: Conjunctivae normal.  Cardiovascular:     Rate and Rhythm: Normal rate and regular rhythm.     Heart sounds: No murmur heard. Pulmonary:     Effort: Pulmonary effort is normal.     Breath sounds: No stridor. No wheezing, rhonchi or rales.  Abdominal:     General: Abdomen is flat.     Palpations: There is no mass.     Tenderness: There is no abdominal tenderness. There is no guarding.     Hernia: No hernia is present.  Musculoskeletal:        General: Normal range of motion.     Cervical back: Neck supple.     Right lower leg: No edema.     Left lower leg: No edema.  Lymphadenopathy:     Cervical: No cervical adenopathy.  Skin:    General: Skin is warm and dry.     Coloration: Skin is not pale.  Neurological:     General: No focal deficit present.     Mental Status: He is alert.  Psychiatric:        Mood and Affect: Mood normal.        Behavior: Behavior normal.     Lab Results  Component Value Date   WBC 5.0 08/30/2022   HGB 16.1 08/30/2022   HCT 45.9 08/30/2022   PLT 270.0 08/30/2022   GLUCOSE 87 08/30/2022   CHOL 204 (H) 08/26/2021   TRIG 101.0 08/26/2021   HDL 60.70 08/26/2021   LDLDIRECT 113.0 03/21/2019   LDLCALC 123 (H) 08/26/2021   ALT 25 08/30/2022   AST 24 08/30/2022   NA 139 08/30/2022   K 4.1 08/30/2022   CL 102 08/30/2022   CREATININE 0.95 08/30/2022   BUN 18 08/30/2022   CO2 29 08/30/2022   TSH 3.12 08/30/2022   PSA 1.6 03/05/2022   HGBA1C 5.8 08/30/2022    No results found.   Assessment & Plan:   [name] was seen today for annual exam.  Diagnoses and all orders for this visit:  Partial loss of tooth due to  periodontal diseases -     Ambulatory referral to Oral Maxillofacial Surgery  Essential hypertension, benign- His blood pressure is well-controlled.  Antihypertensive therapy is not indicated. -     CBC with Differential/Platelet; Future -     TSH; Future -     Hepatic function panel; Future -     Hepatic function panel -     TSH -  CBC with Differential/Platelet  Sarcoidosis (HCC)- Calcium is normal.  This is in remission. -     Basic metabolic panel; Future -     Hepatic function panel; Future -     Hepatic function panel -     Basic metabolic panel  Colon cancer screening -     Cologuard  Routine general medical examination at a health care facility- Exam completed, labs reviewed, vaccines reviewed and updated, cancer screenings addressed, patient education was given.  Chronic hyperglycemia- A1C is normal. -     Basic metabolic panel; Future -     Hemoglobin A1c; Future -     Hemoglobin A1c -     Basic metabolic panel  Need for prophylactic vaccination and inoculation against varicella -     Zoster Vaccine Adjuvanted [facility]) injection; Inject 0.5 mLs into the muscle once for 1 dose.   I have discontinued [name]'s nebivolol, meloxicam, and Tezspire. I am also having him start on Shingrix. Additionally, I am having him maintain his EPINEPHrine, levalbuterol, nitroGLYCERIN, ciclopirox, clotrimazole-betamethasone, montelukast, levalbuterol, sildenafil, diclofenac Sodium, Trelegy Ellipta, and Ryaltris. We will stop administering tezepelumab-ekko and triamcinolone acetonide. Additionally, we will continue to administer tezepelumab-ekko.  Meds ordered this encounter  Medications   Zoster Vaccine Adjuvanted [facility]) injection    Sig: Inject 0.5 mLs into the muscle once for 1 dose.    Dispense:  0.5 mL    Refill:  1     Follow-up: Return in about 6 months (around 02/28/2023).  Sanda Linger, MD

## 2022-08-31 LAB — BASIC METABOLIC PANEL
BUN: 18 mg/dL (ref 6–23)
CO2: 29 mEq/L (ref 19–32)
Calcium: 9.3 mg/dL (ref 8.4–10.5)
Chloride: 102 mEq/L (ref 96–112)
Creatinine, Ser: 0.95 mg/dL (ref 0.40–1.50)
GFR: 90.86 mL/min (ref >60.00)
Glucose, Bld: 87 mg/dL (ref 70–99)
Potassium: 4.1 mEq/L (ref 3.5–5.1)
Sodium: 139 mEq/L (ref 135–145)

## 2022-08-31 LAB — CBC WITH DIFFERENTIAL/PLATELET
Basophils Absolute: 0 10*3/uL (ref 0.0–0.1)
Basophils Relative: 0.3 % (ref 0.0–3.0)
Eosinophils Absolute: 0.1 10*3/uL (ref 0.0–0.7)
Eosinophils Relative: 2.3 % (ref 0.0–5.0)
HCT: 45.9 % (ref 39.0–52.0)
Hemoglobin: 16.1 g/dL (ref 13.0–17.0)
Lymphocytes Relative: 26.6 % (ref 12.0–46.0)
Lymphs Abs: 1.3 10*3/uL (ref 0.7–4.0)
MCHC: 35.2 g/dL (ref 30.0–36.0)
MCV: 82.1 fl (ref 78.0–100.0)
Monocytes Absolute: 0.5 10*3/uL (ref 0.1–1.0)
Monocytes Relative: 10.7 % (ref 3.0–12.0)
Neutro Abs: 3 10*3/uL (ref 1.4–7.7)
Neutrophils Relative %: 60.1 % (ref 43.0–77.0)
Platelets: 270 10*3/uL (ref 150.0–400.0)
RBC: 5.59 Mil/uL (ref 4.22–5.81)
RDW: 13.8 % (ref 11.5–15.5)
WBC: 5 10*3/uL (ref 4.0–10.5)

## 2022-08-31 LAB — HEPATIC FUNCTION PANEL
ALT: 25 U/L (ref 0–53)
AST: 24 U/L (ref 0–37)
Albumin: 4.6 g/dL (ref 3.5–5.2)
Alkaline Phosphatase: 56 U/L (ref 39–117)
Bilirubin, Direct: 0.1 mg/dL (ref 0.0–0.3)
Total Bilirubin: 0.4 mg/dL (ref 0.2–1.2)
Total Protein: 8.3 g/dL (ref 6.0–8.3)

## 2022-08-31 LAB — TSH: TSH: 3.12 u[IU]/mL (ref 0.35–5.50)

## 2022-08-31 LAB — HEMOGLOBIN A1C: Hgb A1c MFr Bld: 5.8 % (ref 4.6–6.5)

## 2022-09-05 MED ORDER — SHINGRIX 50 MCG/0.5ML IM SUSR
0.5000 mL | Freq: Once | INTRAMUSCULAR | 1 refills | Status: AC
  Filled 2022-09-05: qty 0.5, 1d supply, fill #0

## 2022-09-06 ENCOUNTER — Other Ambulatory Visit (HOSPITAL_COMMUNITY): Payer: Self-pay

## 2022-09-13 ENCOUNTER — Other Ambulatory Visit: Payer: Self-pay | Admitting: Pharmacist

## 2022-09-13 ENCOUNTER — Other Ambulatory Visit (HOSPITAL_COMMUNITY): Payer: Self-pay

## 2022-09-13 ENCOUNTER — Other Ambulatory Visit: Payer: Self-pay | Admitting: Allergy

## 2022-09-13 MED ORDER — TEZSPIRE 210 MG/1.91ML ~~LOC~~ SOSY
210.0000 mg | PREFILLED_SYRINGE | SUBCUTANEOUS | 11 refills | Status: DC
  Filled 2022-09-13: qty 1.91, 28d supply, fill #0

## 2022-09-13 MED ORDER — TEZSPIRE 210 MG/1.91ML ~~LOC~~ SOSY
210.0000 mg | PREFILLED_SYRINGE | SUBCUTANEOUS | 11 refills | Status: DC
  Filled 2022-09-13 – 2022-09-21 (×4): qty 1.91, 28d supply, fill #0
  Filled 2022-10-18: qty 1.91, 28d supply, fill #1
  Filled 2022-11-05: qty 1.91, 28d supply, fill #2
  Filled 2022-12-06 – 2022-12-23 (×3): qty 1.91, 28d supply, fill #3
  Filled 2023-01-12: qty 1.91, 28d supply, fill #4
  Filled 2023-02-16 (×2): qty 1.91, 28d supply, fill #5
  Filled 2023-03-09: qty 1.91, 28d supply, fill #6
  Filled 2023-03-30: qty 1.91, 28d supply, fill #7
  Filled 2023-04-25: qty 1.91, 28d supply, fill #8
  Filled 2023-05-19: qty 1.91, 28d supply, fill #9
  Filled 2023-06-16: qty 1.91, 28d supply, fill #10
  Filled 2023-07-14: qty 1.91, 28d supply, fill #11

## 2022-09-15 ENCOUNTER — Other Ambulatory Visit (HOSPITAL_COMMUNITY): Payer: Self-pay

## 2022-09-15 ENCOUNTER — Other Ambulatory Visit: Payer: Self-pay

## 2022-09-16 ENCOUNTER — Other Ambulatory Visit: Payer: Self-pay | Admitting: Internal Medicine

## 2022-09-16 ENCOUNTER — Other Ambulatory Visit: Payer: Self-pay

## 2022-09-16 ENCOUNTER — Other Ambulatory Visit (HOSPITAL_COMMUNITY): Payer: Self-pay

## 2022-09-16 DIAGNOSIS — M48061 Spinal stenosis, lumbar region without neurogenic claudication: Secondary | ICD-10-CM

## 2022-09-16 MED ORDER — IBUPROFEN 800 MG PO TABS
800.0000 mg | ORAL_TABLET | Freq: Three times a day (TID) | ORAL | 2 refills | Status: DC | PRN
  Filled 2022-09-16: qty 90, 30d supply, fill #0
  Filled 2022-11-05: qty 90, 30d supply, fill #1
  Filled 2023-07-15: qty 90, 30d supply, fill #2

## 2022-09-17 ENCOUNTER — Other Ambulatory Visit: Payer: Self-pay

## 2022-09-17 ENCOUNTER — Other Ambulatory Visit (HOSPITAL_COMMUNITY): Payer: Self-pay

## 2022-09-18 DIAGNOSIS — Z1211 Encounter for screening for malignant neoplasm of colon: Secondary | ICD-10-CM | POA: Diagnosis not present

## 2022-09-20 ENCOUNTER — Other Ambulatory Visit: Payer: Self-pay

## 2022-09-20 ENCOUNTER — Other Ambulatory Visit (HOSPITAL_COMMUNITY): Payer: Self-pay

## 2022-09-21 ENCOUNTER — Other Ambulatory Visit (HOSPITAL_COMMUNITY): Payer: Self-pay

## 2022-09-21 ENCOUNTER — Other Ambulatory Visit: Payer: Self-pay

## 2022-09-22 ENCOUNTER — Ambulatory Visit (INDEPENDENT_AMBULATORY_CARE_PROVIDER_SITE_OTHER): Payer: 59

## 2022-09-22 ENCOUNTER — Other Ambulatory Visit (HOSPITAL_COMMUNITY): Payer: Self-pay

## 2022-09-22 DIAGNOSIS — J4550 Severe persistent asthma, uncomplicated: Secondary | ICD-10-CM | POA: Diagnosis not present

## 2022-09-24 LAB — COLOGUARD: COLOGUARD: NEGATIVE

## 2022-09-28 ENCOUNTER — Other Ambulatory Visit (HOSPITAL_COMMUNITY): Payer: Self-pay

## 2022-10-08 ENCOUNTER — Other Ambulatory Visit (HOSPITAL_COMMUNITY): Payer: Self-pay

## 2022-10-11 ENCOUNTER — Other Ambulatory Visit (HOSPITAL_COMMUNITY): Payer: Self-pay

## 2022-10-12 ENCOUNTER — Other Ambulatory Visit (HOSPITAL_COMMUNITY): Payer: Self-pay

## 2022-10-12 NOTE — Progress Notes (Unsigned)
Follow Up Note  RE: [name] MRN: [id number] DOB: 1966-09-22 Date of Office Visit: 10/13/2022  Referring provider: Etta Grandchild, MD Primary care provider: Etta Grandchild, MD  Chief Complaint: Severe persistent asthma without complication and Follow-up  History of Present Illness: I had the pleasure of seeing [name] for a follow up visit at the [facility] on 10/13/2022. He is a 57 y.o. male, who is being followed for asthma on tezspire and allergic rhinitis. His previous allergy office visit was on 06/14/2022 with Dr. Selena Batten. Today is a regular follow up visit.  Severe persistent asthma Taking Trelegy and Singulair daily with good benefit. Doing well with Tezspire injections with no issues.   Only had to use levoalbuterol once.   Denies any SOB, coughing, wheezing, chest tightness, nocturnal awakenings, ER/urgent care visits or prednisone use since the last visit.  Perennial and seasonal allergic rhinitis Controlled with Singulair daily.  He has been working 12 hour shifts Monday through Friday. He works across the street from our new office which has been very convenient for him.   Assessment and Plan: [name] is a 57 y.o. male with: Severe persistent asthma without complication Past history - sarcoidosis.  Interim history - doing well with below regimen. Taking meds daily. Daily controller medication(s): Trelegy 1puff once a day and rinse mouth after each use.  Continue Tezspire injection every 4 weeks.  Continue montelukast (Singulair) 10mg  daily at night. May use levoalbuterol rescue inhaler 2 puffs every 4 to 6 hours as needed for shortness of breath, chest tightness, coughing, and wheezing. May use levoalbuterol rescue inhaler 2 puffs 5 to 15 minutes prior to strenuous physical activities. Monitor frequency of use.  Get spirometry at next visit.  Perennial and seasonal allergic rhinitis Past history - 2019 skin testing was positive  to grass, weed, ragweed, trees, mold, dust mites, cockroach, cat and dog.  Interim history - controlled with Singulair daily only.  Continue montelukast (Singulair) 10mg  daily at night. Use over the counter antihistamines such as Zyrtec (cetirizine), Claritin (loratadine), Allegra (fexofenadine), or Xyzal (levocetirizine) daily as needed. May take twice a day during allergy flares. May switch antihistamines every few months. May use Ryaltris (olopatadine + mometasone nasal spray combination) 1-2 sprays per nostril twice a day.   Return in about 4 months (around 02/11/2023).  No orders of the defined types were placed in this encounter.  Lab Orders  No laboratory test(s) ordered today    Diagnostics: None.   Medication List:  Current Outpatient Medications  Medication Sig Dispense Refill   ciclopirox (PENLAC) 8 % solution Apply topically at bedtime. Apply over nail and surrounding skin. Apply daily over previous coat. After 7 days, may remove with alcohol and continue cycle. 6.6 mL 1   clotrimazole-betamethasone (LOTRISONE) cream Apply to affected area 2 times daily. 30 g 0   diclofenac Sodium (VOLTAREN) 1 % GEL APPLY 2 TO 4 GRAMS TO AFFECTED AREA(S) 4 TIMES DAILY 300 g 2   EPINEPHrine 0.3 mg/0.3 mL IJ SOAJ injection Inject 0.3 mg into the muscle as needed for anaphylaxis.   2   Fluticasone-Umeclidin-Vilant (TRELEGY ELLIPTA) 200-62.5-25 MCG/ACT AEPB Inhale 1 puff into the lungs daily. Rinse mouth after each use. 60 each 5   ibuprofen (ADVIL) 800 MG tablet Take 1 tablet (800 mg total) by mouth every 8 (eight) hours as needed. 90 tablet 2   levalbuterol (XOPENEX HFA) 45 MCG/ACT inhaler Inhale 2 puffs into the lungs every 4 (  four) hours as needed for wheezing or shortness of breath (coughing fits). 15 g 1   levalbuterol (XOPENEX) 0.63 MG/3ML nebulizer solution Take 3 mLs (0.63 mg total) by nebulization every 8 (eight) hours as needed for wheezing or shortness of breath. 150 mL 3   montelukast  (SINGULAIR) 10 MG tablet Take 1 tablet (10 mg total) by mouth at bedtime. 30 tablet 5   Olopatadine-Mometasone (RYALTRIS) 665-25 MCG/ACT SUSP Place 1-2 sprays into the nose in the morning and at bedtime. 29 g 5   tezepelumab-ekko (TEZSPIRE) 210 MG/1. syringe Inject 1.91 mLs (210 mg total) into the skin every 28 (twenty-eight) days. 1.91 mL 11   nitroGLYCERIN (NITRODUR - DOSED IN MG/24 HR) 0.2 mg/hr patch Apply 1/4 patch daily to tendon for tendonitis. (Patient not taking: Reported on 10/13/2022) 30 patch 1   sildenafil (VIAGRA) 100 MG tablet Take 1 tablet (100 mg total) by mouth as directed as needed (Patient not taking: Reported on 10/13/2022) 30 tablet 11   Current Facility-Administered Medications  Medication Dose Route Frequency Provider Last Rate Last Admin   tezepelumab-ekko (TEZSPIRE) 210 MG/1. syringe 210 mg  210 mg Subcutaneous Q28 days Marcelyn Bruins, MD   210 mg at 09/22/22 0093   Allergies: No Known Allergies I reviewed his past medical history, social history, family history, and environmental history and no significant changes have been reported from his previous visit.  Review of Systems  Constitutional:  Negative for appetite change, chills, fever and unexpected weight change.  HENT:  Negative for congestion, rhinorrhea and sneezing.   Eyes:  Negative for itching.  Respiratory:  Negative for cough, chest tightness, shortness of breath and wheezing.   Gastrointestinal:  Negative for abdominal pain.  Skin:  Negative for rash.  Allergic/Immunologic: Positive for environmental allergies.  Neurological:  Negative for headaches.    Objective: BP 122/70   Pulse (!) 107   Temp 97.9 F (36.6 C) (Temporal)   Resp 18   Ht 5\' 11"  (1.803 m)   Wt 154 lb 11.2 oz (70.2 kg)   SpO2 96%   BMI 21.58 kg/m  Body mass index is 21.58 kg/m. Physical Exam Vitals and nursing note reviewed.  Constitutional:      Appearance: Normal appearance. He is well-developed.   HENT:     Head: Normocephalic and atraumatic.     Right Ear: Tympanic membrane and external ear normal.     Left Ear: Tympanic membrane and external ear normal.     Nose: Nose normal.     Mouth/Throat:     Mouth: Mucous membranes are moist.     Pharynx: Oropharynx is clear.  Eyes:     Conjunctiva/sclera: Conjunctivae normal.  Cardiovascular:     Rate and Rhythm: Normal rate and regular rhythm.     Heart sounds: Normal heart sounds. No murmur heard. Pulmonary:     Effort: Pulmonary effort is normal.     Breath sounds: Normal breath sounds. No wheezing, rhonchi or rales.  Musculoskeletal:     Cervical back: Neck supple.  Skin:    General: Skin is warm.     Findings: No rash.  Neurological:     Mental Status: He is alert and oriented to person, place, and time.  Psychiatric:        Behavior: Behavior normal.    Previous notes and tests were reviewed. The plan was reviewed with the patient/family, and all questions/concerned were addressed.  It was my pleasure to see [name] today and participate in his  care. Please feel free to contact me with any questions or concerns.  Sincerely,  Rexene Alberts, DO Allergy & Immunology  [facility] office: [place] office: [phone]

## 2022-10-13 ENCOUNTER — Encounter: Payer: Self-pay | Admitting: Allergy

## 2022-10-13 ENCOUNTER — Other Ambulatory Visit: Payer: Self-pay

## 2022-10-13 ENCOUNTER — Ambulatory Visit (INDEPENDENT_AMBULATORY_CARE_PROVIDER_SITE_OTHER): Payer: Commercial Managed Care - PPO | Admitting: Allergy

## 2022-10-13 VITALS — BP 122/70 | HR 107 | Temp 97.9°F | Resp 18 | Ht 71.0 in | Wt 154.7 lb

## 2022-10-13 DIAGNOSIS — J4550 Severe persistent asthma, uncomplicated: Secondary | ICD-10-CM | POA: Diagnosis not present

## 2022-10-13 DIAGNOSIS — J3089 Other allergic rhinitis: Secondary | ICD-10-CM | POA: Diagnosis not present

## 2022-10-13 NOTE — Patient Instructions (Addendum)
Asthma: Daily controller medication(s): Trelegy 237mcg 1puff once a day and rinse mouth after each use.  Continue Tezspire injection every 4 weeks.  Continue montelukast (singulair) 10mg  daily at night. May use levoalbuterol rescue inhaler 2 puffs every 4 to 6 hours as needed for shortness of breath, chest tightness, coughing, and wheezing. May use levoalbuterol rescue inhaler 2 puffs 5 to 15 minutes prior to strenuous physical activities. Monitor frequency of use.  Asthma control goals:  Full participation in all desired activities (may need albuterol before activity) Albuterol use two times or less a week on average (not counting use with activity) Cough interfering with sleep two times or less a month Oral steroids no more than once a year No hospitalizations  Perennial and seasonal allergic rhinitis Continue montelukast (singulair) 10mg  daily at night. Use over the counter antihistamines such as Zyrtec (cetirizine), Claritin (loratadine), Allegra (fexofenadine), or Xyzal (levocetirizine) daily as needed. May take twice a day during allergy flares. May switch antihistamines every few months. May use Ryaltris (olopatadine + mometasone nasal spray combination) 1-2 sprays per nostril twice a day.   Follow up in 4 months or sooner if needed.

## 2022-10-13 NOTE — Assessment & Plan Note (Signed)
Past history - sarcoidosis.  Interim history - doing well with below regimen. Taking meds daily. Daily controller medication(s): Trelegy 268mcg 1puff once a day and rinse mouth after each use.  Continue Tezspire injection every 4 weeks.  Continue montelukast (Singulair) 10mg  daily at night. May use levoalbuterol rescue inhaler 2 puffs every 4 to 6 hours as needed for shortness of breath, chest tightness, coughing, and wheezing. May use levoalbuterol rescue inhaler 2 puffs 5 to 15 minutes prior to strenuous physical activities. Monitor frequency of use.  Get spirometry at next visit.

## 2022-10-13 NOTE — Assessment & Plan Note (Signed)
Past history - 2019 skin testing was positive to grass, weed, ragweed, trees, mold, dust mites, cockroach, cat and dog.  Interim history - controlled with Singulair daily only.  Continue montelukast (Singulair) 10mg  daily at night. Use over the counter antihistamines such as Zyrtec (cetirizine), Claritin (loratadine), Allegra (fexofenadine), or Xyzal (levocetirizine) daily as needed. May take twice a day during allergy flares. May switch antihistamines every few months. May use Ryaltris (olopatadine + mometasone nasal spray combination) 1-2 sprays per nostril twice a day.

## 2022-10-14 ENCOUNTER — Other Ambulatory Visit (HOSPITAL_COMMUNITY): Payer: Self-pay

## 2022-10-18 ENCOUNTER — Other Ambulatory Visit (HOSPITAL_COMMUNITY): Payer: Self-pay

## 2022-10-19 ENCOUNTER — Other Ambulatory Visit (HOSPITAL_COMMUNITY): Payer: Self-pay

## 2022-10-20 ENCOUNTER — Ambulatory Visit (INDEPENDENT_AMBULATORY_CARE_PROVIDER_SITE_OTHER): Payer: Commercial Managed Care - PPO

## 2022-10-20 DIAGNOSIS — J4550 Severe persistent asthma, uncomplicated: Secondary | ICD-10-CM | POA: Diagnosis not present

## 2022-10-21 ENCOUNTER — Other Ambulatory Visit (HOSPITAL_COMMUNITY): Payer: Self-pay

## 2022-11-05 ENCOUNTER — Other Ambulatory Visit (HOSPITAL_COMMUNITY): Payer: Self-pay

## 2022-11-08 ENCOUNTER — Other Ambulatory Visit (HOSPITAL_COMMUNITY): Payer: Self-pay

## 2022-11-09 ENCOUNTER — Telehealth: Payer: Self-pay

## 2022-11-09 NOTE — Telephone Encounter (Signed)
Patient called in - DOB verified - stated he has left a message on the billing specialist's voicemail requesting a copy of how he owes [facility].  Patient advised message would be forwarded as well with his request of full amount to office as of today - 11/09/2022.  Patient verbalized understanding, no further questions.

## 2022-11-10 ENCOUNTER — Other Ambulatory Visit: Payer: Self-pay

## 2022-11-17 ENCOUNTER — Ambulatory Visit (INDEPENDENT_AMBULATORY_CARE_PROVIDER_SITE_OTHER): Payer: Commercial Managed Care - PPO

## 2022-11-17 DIAGNOSIS — J4550 Severe persistent asthma, uncomplicated: Secondary | ICD-10-CM

## 2022-11-25 ENCOUNTER — Other Ambulatory Visit (HOSPITAL_COMMUNITY): Payer: Self-pay

## 2022-12-02 ENCOUNTER — Other Ambulatory Visit (HOSPITAL_COMMUNITY): Payer: Self-pay

## 2022-12-06 ENCOUNTER — Other Ambulatory Visit (HOSPITAL_COMMUNITY): Payer: Self-pay

## 2022-12-07 ENCOUNTER — Other Ambulatory Visit (HOSPITAL_COMMUNITY): Payer: Self-pay

## 2022-12-08 ENCOUNTER — Other Ambulatory Visit (HOSPITAL_COMMUNITY): Payer: Self-pay

## 2022-12-09 ENCOUNTER — Other Ambulatory Visit (HOSPITAL_COMMUNITY): Payer: Self-pay

## 2022-12-09 ENCOUNTER — Encounter: Payer: Self-pay | Admitting: Radiology

## 2022-12-13 ENCOUNTER — Other Ambulatory Visit (HOSPITAL_COMMUNITY): Payer: Self-pay

## 2022-12-14 ENCOUNTER — Other Ambulatory Visit (HOSPITAL_COMMUNITY): Payer: Self-pay

## 2022-12-15 ENCOUNTER — Ambulatory Visit (INDEPENDENT_AMBULATORY_CARE_PROVIDER_SITE_OTHER): Payer: Commercial Managed Care - PPO

## 2022-12-15 ENCOUNTER — Other Ambulatory Visit (HOSPITAL_COMMUNITY): Payer: Self-pay

## 2022-12-15 DIAGNOSIS — J4550 Severe persistent asthma, uncomplicated: Secondary | ICD-10-CM | POA: Diagnosis not present

## 2022-12-20 ENCOUNTER — Other Ambulatory Visit: Payer: Self-pay

## 2022-12-21 ENCOUNTER — Other Ambulatory Visit (HOSPITAL_COMMUNITY): Payer: Self-pay

## 2022-12-23 ENCOUNTER — Other Ambulatory Visit (HOSPITAL_COMMUNITY): Payer: Self-pay

## 2022-12-23 DIAGNOSIS — H524 Presbyopia: Secondary | ICD-10-CM | POA: Diagnosis not present

## 2022-12-24 ENCOUNTER — Other Ambulatory Visit (HOSPITAL_COMMUNITY): Payer: Self-pay

## 2022-12-27 ENCOUNTER — Other Ambulatory Visit (HOSPITAL_COMMUNITY): Payer: Self-pay

## 2022-12-29 ENCOUNTER — Other Ambulatory Visit (HOSPITAL_COMMUNITY): Payer: Self-pay

## 2022-12-30 ENCOUNTER — Other Ambulatory Visit (HOSPITAL_COMMUNITY): Payer: Self-pay

## 2022-12-31 ENCOUNTER — Other Ambulatory Visit (HOSPITAL_COMMUNITY): Payer: Self-pay

## 2023-01-03 ENCOUNTER — Other Ambulatory Visit (HOSPITAL_COMMUNITY): Payer: Self-pay

## 2023-01-04 ENCOUNTER — Other Ambulatory Visit (HOSPITAL_COMMUNITY): Payer: Self-pay

## 2023-01-05 ENCOUNTER — Other Ambulatory Visit (HOSPITAL_COMMUNITY): Payer: Self-pay

## 2023-01-06 ENCOUNTER — Other Ambulatory Visit (HOSPITAL_COMMUNITY): Payer: Self-pay

## 2023-01-07 ENCOUNTER — Other Ambulatory Visit (HOSPITAL_COMMUNITY): Payer: Self-pay

## 2023-01-07 ENCOUNTER — Other Ambulatory Visit: Payer: Self-pay

## 2023-01-10 ENCOUNTER — Other Ambulatory Visit (HOSPITAL_COMMUNITY): Payer: Self-pay

## 2023-01-11 ENCOUNTER — Other Ambulatory Visit (HOSPITAL_COMMUNITY): Payer: Self-pay

## 2023-01-11 ENCOUNTER — Other Ambulatory Visit: Payer: Self-pay

## 2023-01-12 ENCOUNTER — Other Ambulatory Visit (HOSPITAL_COMMUNITY): Payer: Self-pay

## 2023-01-12 ENCOUNTER — Ambulatory Visit (INDEPENDENT_AMBULATORY_CARE_PROVIDER_SITE_OTHER): Payer: Commercial Managed Care - PPO

## 2023-01-12 DIAGNOSIS — J4550 Severe persistent asthma, uncomplicated: Secondary | ICD-10-CM | POA: Diagnosis not present

## 2023-01-13 ENCOUNTER — Telehealth: Payer: Self-pay

## 2023-01-13 NOTE — Telephone Encounter (Signed)
I called the patient and notified him that we did receive his next Tezspire injection at the [facility] office on 01/13/23. I went over the changes with [employer] and mailed out the patient order information to his home address.

## 2023-01-20 ENCOUNTER — Other Ambulatory Visit (HOSPITAL_COMMUNITY): Payer: Self-pay

## 2023-01-20 ENCOUNTER — Other Ambulatory Visit: Payer: Self-pay

## 2023-01-26 ENCOUNTER — Other Ambulatory Visit (HOSPITAL_COMMUNITY): Payer: Self-pay

## 2023-01-27 ENCOUNTER — Other Ambulatory Visit (HOSPITAL_COMMUNITY): Payer: Self-pay

## 2023-01-28 ENCOUNTER — Other Ambulatory Visit (HOSPITAL_COMMUNITY): Payer: Self-pay

## 2023-01-28 ENCOUNTER — Other Ambulatory Visit: Payer: Self-pay

## 2023-02-01 ENCOUNTER — Other Ambulatory Visit (HOSPITAL_COMMUNITY): Payer: Self-pay

## 2023-02-09 ENCOUNTER — Ambulatory Visit: Payer: Commercial Managed Care - PPO

## 2023-02-09 DIAGNOSIS — J4550 Severe persistent asthma, uncomplicated: Secondary | ICD-10-CM

## 2023-02-15 ENCOUNTER — Other Ambulatory Visit (HOSPITAL_COMMUNITY): Payer: Self-pay

## 2023-02-16 ENCOUNTER — Other Ambulatory Visit (HOSPITAL_COMMUNITY): Payer: Self-pay

## 2023-02-17 ENCOUNTER — Other Ambulatory Visit (HOSPITAL_COMMUNITY): Payer: Self-pay

## 2023-02-18 ENCOUNTER — Other Ambulatory Visit (HOSPITAL_COMMUNITY): Payer: Self-pay

## 2023-02-20 NOTE — Progress Notes (Unsigned)
Follow Up Note  RE: [name] MRN: [id number] DOB: 09/29/1966 Date of Office Visit: 02/21/2023  Referring provider: Etta Grandchild, MD Primary care provider: Etta Grandchild, MD  Chief Complaint: No chief complaint on file.  History of Present Illness: I had the pleasure of seeing [name] for a [facility]  on [facility]/20/2023. He is a 57 y.o. male, who is being followed for asthma, allergic rhinitis. His previous allergy office visit was on 10/13/2022 with Dr. Selena Batten. Today is a regular follow up visit.  Severe persistent asthma without complication Past history - sarcoidosis.  Interim history - doing well with below regimen. Taking meds daily. Daily controller medication(s): Trelegy 1puff once a day and rinse mouth after each use.  Continue Tezspire injection every 4 weeks.  Continue montelukast (Singulair) 10mg  daily at night. May use levoalbuterol rescue inhaler 2 puffs every 4 to 6 hours as needed for shortness of breath, chest tightness, coughing, and wheezing. May use levoalbuterol rescue inhaler 2 puffs 5 to 15 minutes prior to strenuous physical activities. Monitor frequency of use.  Get spirometry at next visit.   Perennial and seasonal allergic rhinitis Past history - 2019 skin testing was positive to grass, weed, ragweed, trees, mold, dust mites, cockroach, cat and dog.  Interim history - controlled with Singulair daily only.  Continue montelukast (Singulair) 10mg  daily at night. Use over the counter antihistamines such as Zyrtec (cetirizine), Claritin (loratadine), Allegra (fexofenadine), or Xyzal (levocetirizine) daily as needed. May take twice a day during allergy flares. May switch antihistamines every few months. May use Ryaltris (olopatadine + mometasone nasal spray combination) 1-2 sprays per nostril twice a day.    Return in about 4 months (around 02/11/2023).  Assessment and Plan: [name] is a 57 y.o. male  with: No problem-specific Assessment & Plan notes found for this encounter.  No follow-ups on file.  No orders of the defined types were placed in this encounter.  Lab Orders  No laboratory test(s) ordered today    Diagnostics: Spirometry:  Tracings reviewed. His effort: {Blank single:19197::"Good reproducible efforts.","It was hard to get consistent efforts and there is a question as to whether this reflects a maximal maneuver.","Poor effort, data can not be interpreted."} FVC: ***L FEV1: ***L, ***% predicted FEV1/FVC ratio: ***% Interpretation: {Blank single:19197::"Spirometry consistent with mild obstructive disease","Spirometry consistent with moderate obstructive disease","Spirometry consistent with severe obstructive disease","Spirometry consistent with possible restrictive disease","Spirometry consistent with mixed obstructive and restrictive disease","Spirometry uninterpretable due to technique","Spirometry consistent with normal pattern","No overt abnormalities noted given today's efforts"}.  Please see scanned spirometry results for details.  Skin Testing: {Blank single:19197::"Select foods","Environmental allergy panel","Environmental allergy panel and select foods","Food allergy panel","None","Deferred due to recent antihistamines use"}. *** Results discussed with patient/family.   Medication List:  Current Outpatient Medications  Medication Sig Dispense Refill   ciclopirox (PENLAC) 8 % solution Apply topically at bedtime. Apply over nail and surrounding skin. Apply daily over previous coat. After 7 days, may remove with alcohol and continue cycle. 6.6 mL 1   clotrimazole-betamethasone (LOTRISONE) cream Apply to affected area 2 times daily. 30 g 0   diclofenac Sodium (VOLTAREN) 1 % GEL APPLY 2 TO 4 GRAMS TO AFFECTED AREA(S) 4 TIMES DAILY 300 g 2   EPINEPHrine 0.3 mg/0.3 mL IJ SOAJ injection Inject 0.3 mg into the muscle as needed for anaphylaxis.   2    Fluticasone-Umeclidin-Vilant (TRELEGY ELLIPTA) 200-62.5-25 MCG/ACT AEPB Inhale 1 puff into the lungs daily. Rinse  mouth after each use. 60 each 5   ibuprofen (ADVIL) 800 MG tablet Take 1 tablet (800 mg total) by mouth every 8 (eight) hours as needed. 90 tablet 2   levalbuterol (XOPENEX HFA) 45 MCG/ACT inhaler Inhale 2 puffs into the lungs every 4 (four) hours as needed for wheezing or shortness of breath (coughing fits). 15 g 1   levalbuterol (XOPENEX) 0.63 MG/3ML nebulizer solution Take 3 mLs (0.63 mg total) by nebulization every 8 (eight) hours as needed for wheezing or shortness of breath. 150 mL 3   montelukast (SINGULAIR) 10 MG tablet Take 1 tablet (10 mg total) by mouth at bedtime. 30 tablet 5   nitroGLYCERIN (NITRODUR - DOSED IN MG/24 HR) 0.2 mg/hr patch Apply 1/4 patch daily to tendon for tendonitis. (Patient not taking: Reported on 10/13/2022) 30 patch 1   Olopatadine-Mometasone (RYALTRIS) 665-25 MCG/ACT SUSP Place 1-2 sprays into the nose in the morning and at bedtime. 29 g 5   sildenafil (VIAGRA) 100 MG tablet Take 1 tablet (100 mg total) by mouth as directed as needed (Patient not taking: Reported on 10/13/2022) 30 tablet 11   tezepelumab-ekko (TEZSPIRE) 210 MG/1. syringe Inject 1.91 mLs (210 mg total) into the skin every 28 (twenty-eight) days. 1.91 mL 11   Current Facility-Administered Medications  Medication Dose Route Frequency Provider Last Rate Last Admin   tezepelumab-ekko (TEZSPIRE) 210 MG/1. syringe 210 mg  210 mg Subcutaneous Q28 days Marcelyn Bruins, MD   210 mg at 02/09/23 1120   Allergies: No Known Allergies I reviewed his past medical history, social history, family history, and environmental history and no significant changes have been reported from his previous visit.  Review of Systems  Constitutional:  Negative for appetite change, chills, fever and unexpected weight change.  HENT:  Negative for congestion, rhinorrhea and sneezing.   Eyes:  Negative  for itching.  Respiratory:  Negative for cough, chest tightness, shortness of breath and wheezing.   Gastrointestinal:  Negative for abdominal pain.  Skin:  Negative for rash.  Allergic/Immunologic: Positive for environmental allergies.  Neurological:  Negative for headaches.    Objective: There were no vitals taken for this visit. There is no height or weight on file to calculate BMI. Physical Exam Vitals and nursing note reviewed.  Constitutional:      Appearance: Normal appearance. He is well-developed.  HENT:     Head: Normocephalic and atraumatic.     Right Ear: Tympanic membrane and external ear normal.     Left Ear: Tympanic membrane and external ear normal.     Nose: Nose normal.     Mouth/Throat:     Mouth: Mucous membranes are moist.     Pharynx: Oropharynx is clear.  Eyes:     Conjunctiva/sclera: Conjunctivae normal.  Cardiovascular:     Rate and Rhythm: Normal rate and regular rhythm.     Heart sounds: Normal heart sounds. No murmur heard. Pulmonary:     Effort: Pulmonary effort is normal.     Breath sounds: Normal breath sounds. No wheezing, rhonchi or rales.  Musculoskeletal:     Cervical back: Neck supple.  Skin:    General: Skin is warm.     Findings: No rash.  Neurological:     Mental Status: He is alert and oriented to person, place, and time.  Psychiatric:        Behavior: Behavior normal.    Previous notes and tests were reviewed. The plan was reviewed with the patient/family, and all questions/concerned were  addressed.  It was my pleasure to see [name] today and participate in his care. Please feel free to contact me with any questions or concerns.  Sincerely,  Wyline Mood, DO Allergy & Immunology  [facility] office: [phone] [facility] office: [phone]

## 2023-02-21 ENCOUNTER — Ambulatory Visit (INDEPENDENT_AMBULATORY_CARE_PROVIDER_SITE_OTHER): Payer: Commercial Managed Care - PPO | Admitting: Allergy

## 2023-02-21 ENCOUNTER — Other Ambulatory Visit (HOSPITAL_COMMUNITY): Payer: Self-pay

## 2023-02-21 ENCOUNTER — Other Ambulatory Visit: Payer: Self-pay

## 2023-02-21 ENCOUNTER — Encounter: Payer: Self-pay | Admitting: Allergy

## 2023-02-21 VITALS — BP 140/100 | HR 103 | Temp 98.7°F | Resp 16 | Ht 71.0 in | Wt 152.9 lb

## 2023-02-21 DIAGNOSIS — R030 Elevated blood-pressure reading, without diagnosis of hypertension: Secondary | ICD-10-CM | POA: Diagnosis not present

## 2023-02-21 DIAGNOSIS — J3089 Other allergic rhinitis: Secondary | ICD-10-CM | POA: Diagnosis not present

## 2023-02-21 DIAGNOSIS — J4550 Severe persistent asthma, uncomplicated: Secondary | ICD-10-CM | POA: Diagnosis not present

## 2023-02-21 MED ORDER — PREDNISONE 10 MG PO TABS
ORAL_TABLET | ORAL | 0 refills | Status: AC
  Filled 2023-02-21: qty 9, 5d supply, fill #0

## 2023-02-21 MED ORDER — TRELEGY ELLIPTA 200-62.5-25 MCG/ACT IN AEPB
1.0000 | INHALATION_SPRAY | Freq: Every day | RESPIRATORY_TRACT | 5 refills | Status: DC
  Filled 2023-02-21: qty 60, 30d supply, fill #0

## 2023-02-21 MED ORDER — LEVALBUTEROL HCL 0.63 MG/3ML IN NEBU
0.6300 mg | INHALATION_SOLUTION | RESPIRATORY_TRACT | 1 refills | Status: DC | PRN
  Filled 2023-02-21: qty 75, 4d supply, fill #0

## 2023-02-21 MED ORDER — MONTELUKAST SODIUM 10 MG PO TABS
10.0000 mg | ORAL_TABLET | Freq: Every day | ORAL | 2 refills | Status: DC
  Filled 2023-02-21: qty 90, 90d supply, fill #0
  Filled 2023-05-19 – 2024-01-06 (×2): qty 90, 90d supply, fill #1

## 2023-02-21 MED ORDER — LEVALBUTEROL TARTRATE 45 MCG/ACT IN AERO
2.0000 | INHALATION_SPRAY | RESPIRATORY_TRACT | 2 refills | Status: DC | PRN
  Filled 2023-02-21 – 2023-02-22 (×2): qty 15, 16d supply, fill #0
  Filled 2023-05-11 (×2): qty 15, 16d supply, fill #1

## 2023-02-21 NOTE — Patient Instructions (Addendum)
Asthma: Start prednisone taper. Prednisone 10mg  tablets - take 2 tablets for 4 days then 1 tablet on day 5.   Use nebulizer twice a day for the next 1 week.  Daily controller medication(s): Restart Trelegy 1puff once a day and rinse mouth after each use.  Samples given. Let us know about price.  Continue Tezspire injection every 4 weeks.  Restart montelukast (singulair) 10mg  daily at night. May use levoalbuterol rescue inhaler 2 puffs or nebulizer every 4 to 6 hours as needed for shortness of breath, chest tightness, coughing, and wheezing. May use levoalbuterol rescue inhaler 2 puffs 5 to 15 minutes prior to strenuous physical activities. Monitor frequency of use - if you need to use it more than twice per week on a consistent basis let us know.  Asthma control goals:  Full participation in all desired activities (may need albuterol before activity) Albuterol use two times or less a week on average (not counting use with activity) Cough interfering with sleep two times or less a month Oral steroids no more than once a year No hospitalizations  Perennial and seasonal allergic rhinitis Continue montelukast (singulair) 10mg  daily at night. Use over the counter antihistamines such as Zyrtec (cetirizine), Claritin (loratadine), Allegra (fexofenadine), or Xyzal (levocetirizine) daily as needed. May take twice a day during allergy flares. May switch antihistamines every few months. May use Ryaltris (olopatadine + mometasone nasal spray combination) 1-2 sprays per nostril twice a day.   Elevated blood pressure Follow up with PCP regarding this.   Follow up in 3 months or sooner if needed.

## 2023-02-21 NOTE — Assessment & Plan Note (Signed)
Follow up with PCP regarding this.  

## 2023-02-21 NOTE — Assessment & Plan Note (Signed)
Past history - sarcoidosis.  Interim history - NOT taking meds and asthma flaring after doing yardwork. Requiring albuterol daily at night x 2 weeks. Today's spirometry showed moderate obstruction. Start prednisone taper. Prednisone 10mg  tablets - take 2 tablets for 4 days then 1 tablet on day 5.  Use levoalbuterol nebulizer twice a day for the next 1 week.  Daily controller medication(s): Restart Trelegy 1puff once a day and rinse mouth after each use.  Samples given. Let us know if too expensive.  Continue Tezspire injection every 4 weeks.  Restart montelukast (singulair) 10mg  daily at night. May use levoalbuterol rescue inhaler 2 puffs or nebulizer every 4 to 6 hours as needed for shortness of breath, chest tightness, coughing, and wheezing. May use levoalbuterol rescue inhaler 2 puffs 5 to 15 minutes prior to strenuous physical activities. Monitor frequency of use - if you need to use it more than twice per week on a consistent basis let us know.  Stressed importance of medical compliance.

## 2023-02-21 NOTE — Assessment & Plan Note (Signed)
Past history - 2019 skin testing was positive to grass, weed, ragweed, trees, mold, dust mites, cockroach, cat and dog.  Interim history - controlled with Claritin.  Continue montelukast (singulair) 10mg  daily at night. Use over the counter antihistamines such as Zyrtec (cetirizine), Claritin (loratadine), Allegra (fexofenadine), or Xyzal (levocetirizine) daily as needed. May take twice a day during allergy flares. May switch antihistamines every few months. May use Ryaltris (olopatadine + mometasone nasal spray combination) 1-2 sprays per nostril twice a day.

## 2023-02-22 ENCOUNTER — Other Ambulatory Visit (HOSPITAL_COMMUNITY): Payer: Self-pay

## 2023-02-23 ENCOUNTER — Other Ambulatory Visit (HOSPITAL_COMMUNITY): Payer: Self-pay

## 2023-03-02 DIAGNOSIS — N402 Nodular prostate without lower urinary tract symptoms: Secondary | ICD-10-CM | POA: Diagnosis not present

## 2023-03-02 LAB — PSA: PSA: 1.7

## 2023-03-09 ENCOUNTER — Other Ambulatory Visit (HOSPITAL_COMMUNITY): Payer: Self-pay

## 2023-03-09 ENCOUNTER — Ambulatory Visit (INDEPENDENT_AMBULATORY_CARE_PROVIDER_SITE_OTHER): Payer: Commercial Managed Care - PPO | Admitting: *Deleted

## 2023-03-09 DIAGNOSIS — N402 Nodular prostate without lower urinary tract symptoms: Secondary | ICD-10-CM | POA: Diagnosis not present

## 2023-03-09 DIAGNOSIS — J4550 Severe persistent asthma, uncomplicated: Secondary | ICD-10-CM | POA: Diagnosis not present

## 2023-03-09 DIAGNOSIS — R972 Elevated prostate specific antigen [PSA]: Secondary | ICD-10-CM | POA: Diagnosis not present

## 2023-03-10 ENCOUNTER — Other Ambulatory Visit (HOSPITAL_COMMUNITY): Payer: Self-pay

## 2023-03-11 ENCOUNTER — Other Ambulatory Visit: Payer: Self-pay

## 2023-03-11 ENCOUNTER — Other Ambulatory Visit (HOSPITAL_COMMUNITY): Payer: Self-pay

## 2023-03-18 ENCOUNTER — Telehealth: Payer: Self-pay | Admitting: Allergy

## 2023-03-18 ENCOUNTER — Encounter: Payer: Self-pay | Admitting: Internal Medicine

## 2023-03-18 NOTE — Telephone Encounter (Signed)
Patient states his A/C unit went out again and he needs an updated letter stating he has asthma & cannot be without A/C.   Dr. Selena Batten wrote one for him last year. Patient is requesting this letter as soon as possible as it went out 3 days ago.   Best contact number:  Patient states if he cannot be reached at cell phone to call his work phone. Cell phone: [phone] Work: [phone]  Patient can pick up letter from office.

## 2023-03-21 NOTE — Telephone Encounter (Signed)
Patient came by last Friday and picked up letter. Thank you Dr. Allena Katz

## 2023-03-30 ENCOUNTER — Other Ambulatory Visit: Payer: Self-pay

## 2023-03-30 ENCOUNTER — Other Ambulatory Visit (HOSPITAL_COMMUNITY): Payer: Self-pay

## 2023-03-31 ENCOUNTER — Other Ambulatory Visit: Payer: Self-pay

## 2023-04-01 ENCOUNTER — Other Ambulatory Visit (HOSPITAL_COMMUNITY): Payer: Self-pay

## 2023-04-01 ENCOUNTER — Other Ambulatory Visit: Payer: Self-pay

## 2023-04-05 ENCOUNTER — Ambulatory Visit (INDEPENDENT_AMBULATORY_CARE_PROVIDER_SITE_OTHER): Payer: Commercial Managed Care - PPO

## 2023-04-05 DIAGNOSIS — J4550 Severe persistent asthma, uncomplicated: Secondary | ICD-10-CM | POA: Diagnosis not present

## 2023-04-06 ENCOUNTER — Ambulatory Visit: Payer: Commercial Managed Care - PPO

## 2023-04-21 ENCOUNTER — Other Ambulatory Visit: Payer: Self-pay

## 2023-04-21 ENCOUNTER — Other Ambulatory Visit (HOSPITAL_COMMUNITY): Payer: Self-pay

## 2023-04-25 ENCOUNTER — Other Ambulatory Visit (HOSPITAL_COMMUNITY): Payer: Self-pay

## 2023-04-25 ENCOUNTER — Other Ambulatory Visit: Payer: Self-pay

## 2023-04-26 ENCOUNTER — Other Ambulatory Visit (HOSPITAL_COMMUNITY): Payer: Self-pay

## 2023-05-02 ENCOUNTER — Ambulatory Visit (INDEPENDENT_AMBULATORY_CARE_PROVIDER_SITE_OTHER): Payer: Commercial Managed Care - PPO

## 2023-05-02 DIAGNOSIS — J4550 Severe persistent asthma, uncomplicated: Secondary | ICD-10-CM | POA: Diagnosis not present

## 2023-05-03 ENCOUNTER — Ambulatory Visit: Payer: Commercial Managed Care - PPO

## 2023-05-11 ENCOUNTER — Other Ambulatory Visit (HOSPITAL_COMMUNITY): Payer: Self-pay

## 2023-05-12 ENCOUNTER — Other Ambulatory Visit (HOSPITAL_COMMUNITY): Payer: Self-pay

## 2023-05-12 MED ORDER — SILDENAFIL CITRATE 100 MG PO TABS
100.0000 mg | ORAL_TABLET | ORAL | 11 refills | Status: DC
  Filled 2023-05-12: qty 30, 30d supply, fill #0

## 2023-05-17 ENCOUNTER — Other Ambulatory Visit (HOSPITAL_COMMUNITY): Payer: Self-pay

## 2023-05-19 ENCOUNTER — Other Ambulatory Visit (HOSPITAL_COMMUNITY): Payer: Self-pay

## 2023-05-20 ENCOUNTER — Other Ambulatory Visit (HOSPITAL_COMMUNITY): Payer: Self-pay

## 2023-05-22 NOTE — Progress Notes (Unsigned)
Follow Up Note  RE: [name] MRN: [id number] DOB: 1965-12-12 Date of Office Visit: 05/23/2023  Referring provider: Etta Grandchild, MD Primary care provider: Etta Grandchild, MD  Chief Complaint: No chief complaint on file.  History of Present Illness: I had the pleasure of seeing [name] for a follow up visit at [facility] [place] on 05/22/2023. He is a 57 y.o. male, who is being followed for asthma on Tezspire and allergic rhinitis. His previous allergy office visit was on 02/21/2023 with Dr. Selena Batten. Today is a regular follow up visit.  Severe persistent asthma without complication Past history - sarcoidosis.  Interim history - NOT taking meds and asthma flaring after doing yardwork. Requiring albuterol daily at night x 2 weeks. Today's spirometry showed moderate obstruction. Start prednisone taper. Prednisone 10mg  tablets - take 2 tablets for 4 days then 1 tablet on day 5.  Use levoalbuterol nebulizer twice a day for the next 1 week.  Daily controller medication(s): Restart Trelegy 1puff once a day and rinse mouth after each use.  Samples given. Let us know if too expensive.  Continue Tezspire injection every 4 weeks.  Restart montelukast (singulair) 10mg  daily at night. May use levoalbuterol rescue inhaler 2 puffs or nebulizer every 4 to 6 hours as needed for shortness of breath, chest tightness, coughing, and wheezing. May use levoalbuterol rescue inhaler 2 puffs 5 to 15 minutes prior to strenuous physical activities. Monitor frequency of use - if you need to use it more than twice per week on a consistent basis let us know.  Stressed importance of medical compliance.    Perennial and seasonal allergic rhinitis Past history - 2019 skin testing was positive to grass, weed, ragweed, trees, mold, dust mites, cockroach, cat and dog.  Interim history - controlled with Claritin.  Continue montelukast (singulair) 10mg  daily at night. Use over the  counter antihistamines such as Zyrtec (cetirizine), Claritin (loratadine), Allegra (fexofenadine), or Xyzal (levocetirizine) daily as needed. May take twice a day during allergy flares. May switch antihistamines every few months. May use Ryaltris (olopatadine + mometasone nasal spray combination) 1-2 sprays per nostril twice a day.   Elevated blood pressure reading Follow up with PCP regarding this.  Assessment and Plan: [name] is a 57 y.o. male with: ***  No follow-ups on file.  No orders of the defined types were placed in this encounter.  Lab Orders  No laboratory test(s) ordered today    Diagnostics: Spirometry:  Tracings reviewed. His effort: {Blank single:19197::"Good reproducible efforts.","It was hard to get consistent efforts and there is a question as to whether this reflects a maximal maneuver.","Poor effort, data can not be interpreted."} FVC: ***L FEV1: ***L, ***% predicted FEV1/FVC ratio: ***% Interpretation: {Blank single:19197::"Spirometry consistent with mild obstructive disease","Spirometry consistent with moderate obstructive disease","Spirometry consistent with severe obstructive disease","Spirometry consistent with possible restrictive disease","Spirometry consistent with mixed obstructive and restrictive disease","Spirometry uninterpretable due to technique","Spirometry consistent with normal pattern","No overt abnormalities noted given today's efforts"}.  Please see scanned spirometry results for details.  Skin Testing: {Blank single:19197::"Select foods","Environmental allergy panel","Environmental allergy panel and select foods","Food allergy panel","None","Deferred due to recent antihistamines use"}. *** Results discussed with patient/family.   Medication List:  Current Outpatient Medications  Medication Sig Dispense Refill  . clotrimazole-betamethasone (LOTRISONE) cream Apply to affected area 2 times daily. 30 g 0  . Fluticasone-Umeclidin-Vilant (TRELEGY  ELLIPTA) 200-62.5-25 MCG/ACT AEPB Inhale 1 puff into the lungs daily. Rinse mouth after each use. 60 each 5  .  ibuprofen (ADVIL) 800 MG tablet Take 1 tablet (800 mg total) by mouth every 8 (eight) hours as needed. 90 tablet 2  . levalbuterol (XOPENEX HFA) 45 MCG/ACT inhaler Inhale 2 puffs into the lungs every 4 (four) hours as needed for wheezing or shortness of breath (coughing fits). 15 g 2  . levalbuterol (XOPENEX) 0.63 MG/3ML nebulizer solution Inhale 1 vial via nebulizer every 4 (four) hours as needed for wheezing or shortness of breath (coughing fits). 75 mL 1  . montelukast (SINGULAIR) 10 MG tablet Take 1 tablet (10 mg total) by mouth at bedtime. 90 tablet 2  . nitroGLYCERIN (NITRODUR - DOSED IN MG/24 HR) 0.2 mg/hr patch Apply 1/4 patch daily to tendon for tendonitis. (Patient not taking: Reported on 10/13/2022) 30 patch 1  . Olopatadine-Mometasone (RYALTRIS) [id number] MCG/ACT SUSP Place 1-2 sprays into the nose in the morning and at bedtime. 29 g 5  . sildenafil (VIAGRA) 100 MG tablet Take 1 tablet (100 mg) by mouth as directed as needed 30 tablet 11  . tezepelumab-ekko (TEZSPIRE) 210 MG/1. syringe Inject 1.91 mLs (210 mg total) into the skin every 28 (twenty-eight) days. 1.91 mL 11   Current Facility-Administered Medications  Medication Dose Route Frequency Provider Last Rate Last Admin  . tezepelumab-ekko (TEZSPIRE) 210 MG/1. syringe 210 mg  210 mg Subcutaneous Q28 days Marcelyn Bruins, MD   210 mg at 05/02/23 1539   Allergies: No Known Allergies I reviewed his past medical history, social history, family history, and environmental history and no significant changes have been reported from his previous visit.  Review of Systems  Constitutional:  Negative for appetite change, chills, fever and unexpected weight change.  HENT:  Negative for congestion, rhinorrhea and sneezing.   Eyes:  Negative for itching.  Respiratory:  Positive for cough, chest tightness, shortness of  breath and wheezing.   Gastrointestinal:  Negative for abdominal pain.  Skin:  Negative for rash.  Allergic/Immunologic: Positive for environmental allergies.  Neurological:  Negative for headaches.   Objective: There were no vitals taken for this visit. There is no height or weight on file to calculate BMI. Physical Exam Vitals and nursing note reviewed.  Constitutional:      Appearance: Normal appearance. He is well-developed.  HENT:     Head: Normocephalic and atraumatic.     Right Ear: Tympanic membrane and external ear normal.     Left Ear: Tympanic membrane and external ear normal.     Nose: Nose normal.     Mouth/Throat:     Mouth: Mucous membranes are moist.     Pharynx: Oropharynx is clear.  Eyes:     Conjunctiva/sclera: Conjunctivae normal.  Cardiovascular:     Rate and Rhythm: Normal rate and regular rhythm.     Heart sounds: Normal heart sounds. No murmur heard. Pulmonary:     Effort: Pulmonary effort is normal.     Breath sounds: No wheezing, rhonchi or rales.     Comments: Decreased breath sounds. Musculoskeletal:     Cervical back: Neck supple.  Skin:    General: Skin is warm.     Findings: No rash.  Neurological:     Mental Status: He is alert and oriented to person, place, and time.  Psychiatric:        Behavior: Behavior normal.  Previous notes and tests were reviewed. The plan was reviewed with the patient/family, and all questions/concerned were addressed.  It was my pleasure to see [name] today and participate in his care. Please  feel free to contact me with any questions or concerns.  Sincerely,  Wyline Mood, DO Allergy & Immunology  [facility] office: [phone] [facility] office: [phone]

## 2023-05-23 ENCOUNTER — Other Ambulatory Visit: Payer: Self-pay

## 2023-05-23 ENCOUNTER — Ambulatory Visit: Payer: Commercial Managed Care - PPO | Admitting: Allergy

## 2023-05-23 ENCOUNTER — Encounter: Payer: Self-pay | Admitting: Allergy

## 2023-05-23 VITALS — BP 122/92 | HR 99 | Temp 98.0°F | Ht 71.0 in | Wt 154.8 lb

## 2023-05-23 DIAGNOSIS — J3081 Allergic rhinitis due to animal (cat) (dog) hair and dander: Secondary | ICD-10-CM

## 2023-05-23 DIAGNOSIS — J4550 Severe persistent asthma, uncomplicated: Secondary | ICD-10-CM

## 2023-05-23 DIAGNOSIS — Z91038 Other insect allergy status: Secondary | ICD-10-CM | POA: Diagnosis not present

## 2023-05-23 DIAGNOSIS — J301 Allergic rhinitis due to pollen: Secondary | ICD-10-CM

## 2023-05-23 DIAGNOSIS — R030 Elevated blood-pressure reading, without diagnosis of hypertension: Secondary | ICD-10-CM

## 2023-05-23 DIAGNOSIS — J3089 Other allergic rhinitis: Secondary | ICD-10-CM

## 2023-05-23 NOTE — Patient Instructions (Addendum)
Asthma: Take prednisone 20mg  (2 pills) once a day for 3 days.  Use nebulizer twice a day for the next 1 week.  Daily controller medication(s): Restart Trelegy 1puff once a day and rinse mouth after each use.  If it's not covered let me know.  Continue Tezspire injection every 4 weeks.  Continue montelukast (singulair) 10mg  daily at night. May use levoalbuterol rescue inhaler 2 puffs or nebulizer every 4 to 6 hours as needed for shortness of breath, chest tightness, coughing, and wheezing. May use levoalbuterol rescue inhaler 2 puffs 5 to 15 minutes prior to strenuous physical activities. Monitor frequency of use - if you need to use it more than twice per week on a consistent basis let us know.  Asthma control goals:  Full participation in all desired activities (may need albuterol before activity) Albuterol use two times or less a week on average (not counting use with activity) Cough interfering with sleep two times or less a month Oral steroids no more than once a year No hospitalizations  Perennial and seasonal allergic rhinitis Continue montelukast (singulair) 10mg  daily at night. Use over the counter antihistamines such as Zyrtec (cetirizine), Claritin (loratadine), Allegra (fexofenadine), or Xyzal (levocetirizine) daily as needed. May take twice a day during allergy flares. May switch antihistamines every few months. May use Ryaltris (olopatadine + mometasone nasal spray combination) 1-2 sprays per nostril twice a day.   Elevated blood pressure Follow up with PCP regarding this.   Follow up in 3 months or sooner if needed.

## 2023-05-24 ENCOUNTER — Other Ambulatory Visit (HOSPITAL_COMMUNITY): Payer: Self-pay

## 2023-05-30 ENCOUNTER — Ambulatory Visit (INDEPENDENT_AMBULATORY_CARE_PROVIDER_SITE_OTHER): Payer: Commercial Managed Care - PPO

## 2023-05-30 DIAGNOSIS — J4550 Severe persistent asthma, uncomplicated: Secondary | ICD-10-CM

## 2023-06-16 ENCOUNTER — Other Ambulatory Visit (HOSPITAL_COMMUNITY): Payer: Self-pay

## 2023-06-25 ENCOUNTER — Encounter (HOSPITAL_COMMUNITY): Payer: Self-pay

## 2023-06-27 ENCOUNTER — Ambulatory Visit (INDEPENDENT_AMBULATORY_CARE_PROVIDER_SITE_OTHER): Payer: Commercial Managed Care - PPO

## 2023-06-27 DIAGNOSIS — J4550 Severe persistent asthma, uncomplicated: Secondary | ICD-10-CM | POA: Diagnosis not present

## 2023-06-29 ENCOUNTER — Telehealth: Payer: Self-pay | Admitting: Pharmacist

## 2023-06-29 NOTE — Telephone Encounter (Signed)
Called patient to schedule an appointment for the [employer] [facility] [employer] [facility]. I was unable to reach the patient so I left a HIPAA-compliant message requesting that the patient return my call.   Luke Van Ausdall, PharmD, BCACP, CPP Clinical Pharmacist [facility] [phone]  

## 2023-07-07 ENCOUNTER — Ambulatory Visit: Payer: Commercial Managed Care - PPO | Attending: Internal Medicine | Admitting: Pharmacist

## 2023-07-07 DIAGNOSIS — Z7189 Other specified counseling: Secondary | ICD-10-CM

## 2023-07-07 NOTE — Progress Notes (Signed)
   S: Patient presents today for review of their specialty medication.   Patient is taking Tezspire for asthma. Patient is managed by Dr. Selena Batten for this.   Dosing: 210 mg subq q4wks   Adherence: confirmed   Efficacy: reports good efficacy   Monitoring:  S/Sx of hypersensitivity: none PFTs: monitored by Dr. Elmyra Ricks office S/sx of infection: none    Current adverse effects: none reported   O:     Lab Results  Component Value Date   WBC 5.0 08/30/2022   HGB 16.1 08/30/2022   HCT 45.9 08/30/2022   MCV 82.1 08/30/2022   PLT 270.0 08/30/2022      Chemistry      Component Value Date/Time   NA 139 08/30/2022 1628   K 4.1 08/30/2022 1628   CL 102 08/30/2022 1628   CO2 29 08/30/2022 1628   BUN 18 08/30/2022 1628   CREATININE 0.95 08/30/2022 1628   CREATININE 0.98 04/18/2013 1034      Component Value Date/Time   CALCIUM 9.3 08/30/2022 1628   ALKPHOS 56 08/30/2022 1628   AST 24 08/30/2022 1628   ALT 25 08/30/2022 1628   BILITOT 0.4 08/30/2022 1628       A/P: 1. Medication review: patient is taking Tezspire for asthma with good efficacy. He is tolerating the Tezspire well. We discussed the medication including the following: Tezepelumab-ekko is a human monoclonal antibody IgG2? that binds to human thymic stromal lymphopoietin (TSLP), an epithelial cytokine, and prevents human TSLP from interacting with the heterodimeric TSLP receptor. Blocking TSLP with tezepelumab-ekko reduces biomarkers and cytokines associated with inflammation including blood eosinophils, airway submucosal eosinophils, IgE, FeNO, IL-5, and IL-13; however, the mechanism of tezepelumab-ekko action in asthma has not been definitively established. Prior to administration, remove the vial or pre-filled syringe from the refrigerator and allow to reach room temperature for ~60 minutes (do not shake or expose to heat). Administer as a SUBQ injection into the upper arm, thigh, or lower abdomen (avoiding areas within 2  inches of navel). Rotate injection sites; do not inject into areas where the skin is tender, bruised, erythematous, or hardened. Adverse effects are uncommon but may include antibody development, joint pain, back pain, or pharyngitis. Patients should monitor for hypersensitivity reactions and infections. All questions and concerns were addressed. No recommendations for any changes at this time.   Butch Penny, PharmD, [place]y [place], CPP Clinical Pharmacist [facility] [phone]

## 2023-07-14 ENCOUNTER — Other Ambulatory Visit: Payer: Self-pay

## 2023-07-14 NOTE — Progress Notes (Signed)
Specialty Pharmacy Refill Coordination Note  [name] is a 57 y.o. male contacted today regarding refills of specialty medication(s) [facility]   Patient requested Courier to Provider Office   Delivery date: 07/21/23   Verified address: [facility], [address] [place], [place], [address]   Medication will be filled on 07/20/23.

## 2023-07-15 ENCOUNTER — Other Ambulatory Visit (HOSPITAL_COMMUNITY): Payer: Self-pay

## 2023-07-18 ENCOUNTER — Other Ambulatory Visit: Payer: Self-pay

## 2023-07-18 ENCOUNTER — Other Ambulatory Visit (HOSPITAL_COMMUNITY): Payer: Self-pay

## 2023-07-25 ENCOUNTER — Ambulatory Visit: Payer: Commercial Managed Care - PPO

## 2023-07-26 ENCOUNTER — Ambulatory Visit: Payer: Commercial Managed Care - PPO | Admitting: *Deleted

## 2023-07-26 ENCOUNTER — Other Ambulatory Visit (HOSPITAL_COMMUNITY): Payer: Self-pay

## 2023-07-26 DIAGNOSIS — J4550 Severe persistent asthma, uncomplicated: Secondary | ICD-10-CM

## 2023-07-28 ENCOUNTER — Other Ambulatory Visit (HOSPITAL_COMMUNITY): Payer: Self-pay

## 2023-07-28 ENCOUNTER — Other Ambulatory Visit: Payer: Self-pay | Admitting: Physician Assistant

## 2023-07-28 MED ORDER — DICLOFENAC SODIUM 1 % EX GEL
CUTANEOUS | 2 refills | Status: DC
  Filled 2023-07-28: qty 300, 30d supply, fill #0

## 2023-07-29 ENCOUNTER — Other Ambulatory Visit (HOSPITAL_COMMUNITY): Payer: Self-pay

## 2023-08-01 ENCOUNTER — Other Ambulatory Visit (HOSPITAL_COMMUNITY): Payer: Self-pay

## 2023-08-11 ENCOUNTER — Other Ambulatory Visit: Payer: Self-pay | Admitting: Allergy

## 2023-08-11 ENCOUNTER — Other Ambulatory Visit: Payer: Self-pay

## 2023-08-11 ENCOUNTER — Other Ambulatory Visit: Payer: Self-pay | Admitting: Pharmacist

## 2023-08-11 ENCOUNTER — Other Ambulatory Visit (HOSPITAL_COMMUNITY): Payer: Self-pay

## 2023-08-11 MED ORDER — TEZSPIRE 210 MG/1.91ML ~~LOC~~ SOSY
210.0000 mg | PREFILLED_SYRINGE | SUBCUTANEOUS | 11 refills | Status: DC
  Filled 2023-08-11: qty 1.91, 28d supply, fill #0
  Filled 2023-09-07: qty 1.91, 28d supply, fill #1
  Filled 2023-10-07: qty 1.91, 28d supply, fill #2
  Filled 2023-11-03: qty 1.91, 28d supply, fill #3
  Filled 2023-12-01: qty 1.91, 28d supply, fill #4
  Filled 2024-01-02: qty 1.91, 28d supply, fill #5
  Filled 2024-01-30 (×2): qty 1.91, 28d supply, fill #6
  Filled 2024-02-22: qty 1.91, 28d supply, fill #7
  Filled 2024-03-27: qty 1.91, 28d supply, fill #8
  Filled 2024-04-19: qty 1.91, 28d supply, fill #9
  Filled 2024-05-18: qty 1.91, 28d supply, fill #10
  Filled 2024-06-11: qty 1.91, 28d supply, fill #11

## 2023-08-11 MED ORDER — TEZSPIRE 210 MG/1.91ML ~~LOC~~ SOSY
210.0000 mg | PREFILLED_SYRINGE | SUBCUTANEOUS | 11 refills | Status: DC
  Filled 2023-08-11: qty 1.91, 28d supply, fill #0

## 2023-08-11 NOTE — Progress Notes (Signed)
Specialty Pharmacy Refill Coordination Note  [name] is a 57 y.o. male contacted today regarding refills of specialty medication(s) [facility]   Patient requested Courier to Provider Office   Delivery date: 08/18/23   Verified address: [facility], [address] [place], [place], [address]   Medication will be filled on 08/17/23.   Refill request sent to Dr. Murrell Converse. Send to [facility] for rewrite when approved.

## 2023-08-12 ENCOUNTER — Other Ambulatory Visit: Payer: Self-pay

## 2023-08-17 ENCOUNTER — Other Ambulatory Visit: Payer: Self-pay

## 2023-08-19 ENCOUNTER — Other Ambulatory Visit: Payer: Self-pay

## 2023-08-21 NOTE — Progress Notes (Deleted)
Follow Up Note  RE: [name] MRN: [id number] DOB: Jan 10, 1966 Date of Office Visit: 08/22/2023  Referring provider: Etta Grandchild, MD Primary care provider: Etta Grandchild, MD  Chief Complaint: No chief complaint on file.  History of Present Illness: I had the pleasure of seeing [name] for a follow up visit at the [facility] on 08/21/2023. He is a 57 y.o. male, who is being followed for asthma on Tezspire, allergic rhinitis. His previous allergy office visit was on 05/23/2023 with Dr. Selena Batten. Today is a regular follow up visit.  Discussed the use of AI scribe software for clinical note transcription with the patient, who gave verbal consent to proceed.  History of Present Illness            Severe persistent asthma without complication Past history - sarcoidosis.  Interim history - prednisone in May helped but never started on Trelegy. This week having coughing/wheezing due to indoor smoking exposure. Today's spirometry showed severe obstruction.  Take prednisone 20mg  (2 pills) once a day for 3 days.  Use nebulizer twice a day for the next 1 week.  Daily controller medication(s): Restart Trelegy 1puff once a day and rinse mouth after each use.  If it's not covered let me know. Continue Tezspire injection every 4 weeks.  Continue montelukast (Singulair) 10mg  daily at night. May use levoalbuterol rescue inhaler 2 puffs or nebulizer every 4 to 6 hours as needed for shortness of breath, chest tightness, coughing, and wheezing. May use levoalbuterol rescue inhaler 2 puffs 5 to 15 minutes prior to strenuous physical activities. Monitor frequency of use - if you need to use it more than twice per week on a consistent basis let us know.  Get spirometry at next visit.   Seasonal allergic rhinitis due to pollen Allergic rhinitis due to animal dander Allergic rhinitis due to dust mite Allergic rhinitis due to mold Allergy to cockroaches Past  history - 2019 skin testing positive to grass, weed, ragweed, trees, mold, dust mites, cockroach, cat and dog.  Interim history - well controlled. Continue montelukast (Singulair) 10mg  daily at night. Use over the counter antihistamines such as Zyrtec (cetirizine), Claritin (loratadine), Allegra (fexofenadine), or Xyzal (levocetirizine) daily as needed. May take twice a day during allergy flares. May switch antihistamines every few months. May use Ryaltris (olopatadine + mometasone nasal spray combination) 1-2 sprays per nostril twice a day.    Elevated blood pressure Follow up with PCP regarding this.   Assessment and Plan: [name] is a 57 y.o. male with: Severe persistent asthma without complication Past history - sarcoidosis.  Interim history - prednisone in May helped but never started on Trelegy. This week having coughing/wheezing due to indoor smoking exposure. Today's spirometry showed severe obstruction.  Take prednisone 20mg  (2 pills) once a day for 3 days.  Use nebulizer twice a day for the next 1 week.  Daily controller medication(s): Restart Trelegy 1puff once a day and rinse mouth after each use.  If it's not covered let me know. Continue Tezspire injection every 4 weeks.  Continue montelukast (Singulair) 10mg  daily at night. May use levoalbuterol rescue inhaler 2 puffs or nebulizer every 4 to 6 hours as needed for shortness of breath, chest tightness, coughing, and wheezing. May use levoalbuterol rescue inhaler 2 puffs 5 to 15 minutes prior to strenuous physical activities. Monitor frequency of use - if you need to use it more than twice per week on a consistent basis let  us know.  Get spirometry at next visit.   Seasonal allergic rhinitis due to pollen Allergic rhinitis due to animal dander Allergic rhinitis due to dust mite Allergic rhinitis due to mold Allergy to cockroaches Past history - 2019 skin testing positive to grass, weed, ragweed, trees, mold, dust  mites, cockroach, cat and dog.  Interim history - well controlled. Continue montelukast (Singulair) 10mg  daily at night. Use over the counter antihistamines such as Zyrtec (cetirizine), Claritin (loratadine), Allegra (fexofenadine), or Xyzal (levocetirizine) daily as needed. May take twice a day during allergy flares. May switch antihistamines every few months. May use Ryaltris (olopatadine + mometasone nasal spray combination) 1-2 sprays per nostril twice a day.    Assessment and Plan              No follow-ups on file.  No orders of the defined types were placed in this encounter.  Lab Orders  No laboratory test(s) ordered today    Diagnostics: Spirometry:  Tracings reviewed. His effort: {Blank single:19197::"Good reproducible efforts.","It was hard to get consistent efforts and there is a question as to whether this reflects a maximal maneuver.","Poor effort, data can not be interpreted."} FVC: ***L FEV1: ***L, ***% predicted FEV1/FVC ratio: ***% Interpretation: {Blank single:19197::"Spirometry consistent with mild obstructive disease","Spirometry consistent with moderate obstructive disease","Spirometry consistent with severe obstructive disease","Spirometry consistent with possible restrictive disease","Spirometry consistent with mixed obstructive and restrictive disease","Spirometry uninterpretable due to technique","Spirometry consistent with normal pattern","No overt abnormalities noted given today's efforts"}.  Please see scanned spirometry results for details.  Skin Testing: {Blank single:19197::"Select foods","Environmental allergy panel","Environmental allergy panel and select foods","Food allergy panel","None","Deferred due to recent antihistamines use"}. *** Results discussed with patient/family.   Medication List:  Current Outpatient Medications  Medication Sig Dispense Refill   clotrimazole-betamethasone (LOTRISONE) cream Apply to affected area 2 times daily. 30  g 0   diclofenac Sodium (VOLTAREN) 1 % GEL APPLY 2 TO 4 GRAMS TO AFFECTED AREA(S) 4 TIMES DAILY 300 g 2   Fluticasone-Umeclidin-Vilant (TRELEGY ELLIPTA) 200-62.5-25 MCG/ACT AEPB Inhale 1 puff into the lungs daily. Rinse mouth after each use. 60 each 5   levalbuterol (XOPENEX HFA) 45 MCG/ACT inhaler Inhale 2 puffs into the lungs every 4 (four) hours as needed for wheezing or shortness of breath (coughing fits). 15 g 2   levalbuterol (XOPENEX) 0.63 MG/3ML nebulizer solution Inhale 1 vial via nebulizer every 4 (four) hours as needed for wheezing or shortness of breath (coughing fits). 75 mL 1   montelukast (SINGULAIR) 10 MG tablet Take 1 tablet (10 mg total) by mouth at bedtime. 90 tablet 2   nitroGLYCERIN (NITRODUR - DOSED IN MG/24 HR) 0.2 mg/hr patch Apply 1/4 patch daily to tendon for tendonitis. 30 patch 1   Olopatadine-Mometasone (RYALTRIS) 665-25 MCG/ACT SUSP Place 1-2 sprays into the nose in the morning and at bedtime. 29 g 5   sildenafil (VIAGRA) 100 MG tablet Take 1 tablet (100 mg) by mouth as directed as needed 30 tablet 11   tezepelumab-ekko (TEZSPIRE) 210 MG/1. syringe Inject 1.91 mLs (210 mg total) into the skin every 28 (twenty-eight) days. 1.91 mL 11   Current Facility-Administered Medications  Medication Dose Route Frequency Provider Last Rate Last Admin   tezepelumab-ekko (TEZSPIRE) 210 MG/1. syringe 210 mg  210 mg Subcutaneous Q28 days Marcelyn Bruins, MD   210 mg at 07/26/23 1610   Allergies: No Known Allergies I reviewed his past medical history, social history, family history, and environmental history and no significant changes have been reported from his  previous visit.  Review of Systems  Constitutional:  Negative for appetite change, chills, fever and unexpected weight change.  HENT:  Negative for congestion and rhinorrhea.   Eyes:  Negative for itching.  Respiratory:  Negative for cough, chest tightness, shortness of breath and wheezing.    Cardiovascular:  Negative for chest pain.  Gastrointestinal:  Negative for abdominal pain.  Genitourinary:  Negative for difficulty urinating.  Skin:  Negative for rash.  Neurological:  Negative for headaches.    Objective: There were no vitals taken for this visit. There is no height or weight on file to calculate BMI. Physical Exam Vitals and nursing note reviewed.  Constitutional:      Appearance: Normal appearance. He is well-developed.  HENT:     Head: Normocephalic and atraumatic.     Right Ear: Tympanic membrane and external ear normal.     Left Ear: Tympanic membrane and external ear normal.     Nose: Nose normal.     Mouth/Throat:     Mouth: Mucous membranes are moist.     Pharynx: Oropharynx is clear.  Eyes:     Conjunctiva/sclera: Conjunctivae normal.  Cardiovascular:     Rate and Rhythm: Normal rate and regular rhythm.     Heart sounds: Normal heart sounds. No murmur heard.    No friction rub. No gallop.  Pulmonary:     Effort: Pulmonary effort is normal.     Breath sounds: Normal breath sounds. No wheezing, rhonchi or rales.  Musculoskeletal:     Cervical back: Neck supple.  Skin:    General: Skin is warm.     Findings: No rash.  Neurological:     Mental Status: He is alert and oriented to person, place, and time.  Psychiatric:        Behavior: Behavior normal.    Previous notes and tests were reviewed. The plan was reviewed with the patient/family, and all questions/concerned were addressed.  It was my pleasure to see [name] today and participate in his care. Please feel free to contact me with any questions or concerns.  Sincerely,  Wyline Mood, DO Allergy & Immunology  [facility] office: [phone] [facility] office: [phone]

## 2023-08-22 ENCOUNTER — Ambulatory Visit: Payer: Commercial Managed Care - PPO

## 2023-08-22 ENCOUNTER — Ambulatory Visit: Payer: Commercial Managed Care - PPO | Admitting: Allergy

## 2023-08-22 DIAGNOSIS — J3089 Other allergic rhinitis: Secondary | ICD-10-CM

## 2023-08-22 DIAGNOSIS — J4550 Severe persistent asthma, uncomplicated: Secondary | ICD-10-CM

## 2023-08-22 DIAGNOSIS — J301 Allergic rhinitis due to pollen: Secondary | ICD-10-CM

## 2023-08-22 DIAGNOSIS — J3081 Allergic rhinitis due to animal (cat) (dog) hair and dander: Secondary | ICD-10-CM

## 2023-08-22 DIAGNOSIS — Z91038 Other insect allergy status: Secondary | ICD-10-CM

## 2023-08-23 ENCOUNTER — Ambulatory Visit: Payer: Commercial Managed Care - PPO

## 2023-08-29 ENCOUNTER — Ambulatory Visit (INDEPENDENT_AMBULATORY_CARE_PROVIDER_SITE_OTHER): Payer: Commercial Managed Care - PPO | Admitting: Allergy

## 2023-08-29 ENCOUNTER — Other Ambulatory Visit (HOSPITAL_COMMUNITY): Payer: Self-pay

## 2023-08-29 ENCOUNTER — Encounter: Payer: Self-pay | Admitting: Allergy

## 2023-08-29 VITALS — BP 142/90 | HR 101 | Temp 96.8°F | Resp 18 | Wt 157.1 lb

## 2023-08-29 DIAGNOSIS — J3089 Other allergic rhinitis: Secondary | ICD-10-CM | POA: Diagnosis not present

## 2023-08-29 DIAGNOSIS — J4550 Severe persistent asthma, uncomplicated: Secondary | ICD-10-CM

## 2023-08-29 DIAGNOSIS — Z91038 Other insect allergy status: Secondary | ICD-10-CM

## 2023-08-29 DIAGNOSIS — R030 Elevated blood-pressure reading, without diagnosis of hypertension: Secondary | ICD-10-CM | POA: Diagnosis not present

## 2023-08-29 DIAGNOSIS — J301 Allergic rhinitis due to pollen: Secondary | ICD-10-CM | POA: Diagnosis not present

## 2023-08-29 DIAGNOSIS — J3081 Allergic rhinitis due to animal (cat) (dog) hair and dander: Secondary | ICD-10-CM | POA: Diagnosis not present

## 2023-08-29 MED ORDER — LEVALBUTEROL TARTRATE 45 MCG/ACT IN AERO
2.0000 | INHALATION_SPRAY | RESPIRATORY_TRACT | 2 refills | Status: DC | PRN
  Filled 2023-08-29: qty 15, 25d supply, fill #0

## 2023-08-29 MED ORDER — TRELEGY ELLIPTA 200-62.5-25 MCG/ACT IN AEPB
1.0000 | INHALATION_SPRAY | Freq: Every day | RESPIRATORY_TRACT | 5 refills | Status: DC
  Filled 2023-08-29: qty 60, 30d supply, fill #0

## 2023-08-29 NOTE — Patient Instructions (Addendum)
Asthma: Your breathing test  is better but still not ideal.  Daily controller medication(s): take Trelegy 1puff EVERYDAY and rinse mouth after each use.  If it's not covered let me know. Samples given.  https://[email or web address].com/savings-and-[email or web address]/  Continue Tezspire injection every 4 weeks.  Continue montelukast (singulair) 10mg  daily at night. May use levoalbuterol rescue inhaler 2 puffs or nebulizer every 4 to 6 hours as needed for shortness of breath, chest tightness, coughing, and wheezing. May use levoalbuterol rescue inhaler 2 puffs 5 to 15 minutes prior to strenuous physical activities. Monitor frequency of use - if you need to use it more than twice per week on a consistent basis let us know.  Asthma control goals:  Full participation in all desired activities (may need albuterol before activity) Albuterol use two times or less a week on average (not counting use with activity) Cough interfering with sleep two times or less a month Oral steroids no more than once a year No hospitalizations  Perennial and seasonal allergic rhinitis Continue montelukast (singulair) 10mg  daily at night. Use over the counter antihistamines such as Zyrtec (cetirizine), Claritin (loratadine), Allegra (fexofenadine), or Xyzal (levocetirizine) daily as needed. May take twice a day during allergy flares. May switch antihistamines every few months. May use Ryaltris (olopatadine + mometasone nasal spray combination) 1-2 sprays per nostril twice a day.   Elevated blood pressure Follow up with PCP regarding this.   Follow up in 4 months or sooner if needed.

## 2023-08-29 NOTE — Progress Notes (Signed)
Follow Up Note  RE: [name] MRN: [id number] DOB: 23-Feb-1966 Date of Office Visit: 08/29/2023  Referring provider: Etta Grandchild, MD Primary care provider: Etta Grandchild, MD  Chief Complaint: Asthma (No issues )  History of Present Illness: I had the pleasure of seeing [name] for a follow up visit at the [facility] on 08/29/2023. He is a 57 y.o. male, who is being followed for asthma on test prior, allergic rhinitis. His previous allergy office visit was on 05/23/2023 with Dr. Selena Batten. Today is a regular follow up visit.  Discussed the use of AI scribe software for clinical note transcription with the patient, who gave verbal consent to proceed.  The patient, with a history of asthma, has been managing his condition with Trelegy, taken three to four times a week, and has not needed to use his rescue inhaler, Albuterol. He reports no breathing problems. He also receives Tezspire injections every four weeks and takes singulair nightly.  For his allergies, he takes Singulair daily and reports good control of symptoms without the need for additional antihistamines.   The patient also reports high blood pressure, with a physical examination scheduled in the near future.   Assessment and Plan: [name] is a 57 y.o. male with: Severe persistent asthma without complication Past history - sarcoidosis.  Interim history - no prednisone.  Inconsistent use of Trelegy (1 puff 3-4 times per week) Today's spirometry showed moderate obstruction - some improvement from previous one.  Daily controller medication(s): take Trelegy 1puff EVERYDAY and rinse mouth after each use.  If it's not covered let me know. Samples given.  Continue Tezspire injection every 4 weeks.  Continue montelukast (singulair) 10mg  daily at night. May use levoalbuterol rescue inhaler 2 puffs or nebulizer every 4 to 6 hours as needed for shortness of breath, chest tightness, coughing, and  wheezing. May use levoalbuterol rescue inhaler 2 puffs 5 to 15 minutes prior to strenuous physical activities. Monitor frequency of use - if you need to use it more than twice per week on a consistent basis let us know.  Get spirometry at next visit.   Seasonal allergic rhinitis due to pollen Allergic rhinitis due to animal dander Allergic rhinitis due to dust mite Allergic rhinitis due to mold Allergy to cockroaches Past history - 2019 skin testing positive to grass, weed, ragweed, trees, mold, dust mites, cockroach, cat and dog.  Interim history - well controlled with Singulair daily.  Continue montelukast (Singulair) 10mg  daily at night. Use over the counter antihistamines such as Zyrtec (cetirizine), Claritin (loratadine), Allegra (fexofenadine), or Xyzal (levocetirizine) daily as needed. May take twice a day during allergy flares. May switch antihistamines every few months. May use Ryaltris (olopatadine + mometasone nasal spray combination) 1-2 sprays per nostril twice a day.   Elevated blood pressure Follow up with PCP regarding this.   Return in about 4 months (around 12/27/2023).  Meds ordered this encounter  Medications   Fluticasone-Umeclidin-Vilant (TRELEGY ELLIPTA) 200-62.5-25 MCG/ACT AEPB    Sig: Inhale 1 puff into the lungs daily. Rinse mouth after each use.    Dispense:  60 each    Refill:  5   levalbuterol (XOPENEX HFA) 45 MCG/ACT inhaler    Sig: Inhale 2 puffs into the lungs every 4 (four) hours as needed for wheezing or shortness of breath (coughing fits).    Dispense:  15 g    Refill:  2   Lab Orders  No laboratory test(s) ordered  today    Diagnostics: Spirometry:  Tracings reviewed. His effort: Good reproducible efforts. FVC: 2.93L FEV1: 1.85L, 55% predicted FEV1/FVC ratio: 63% Interpretation: Spirometry consistent with moderate obstructive disease.  Please see scanned spirometry results for details.  Medication List:  Current Outpatient Medications   Medication Sig Dispense Refill   clotrimazole-betamethasone (LOTRISONE) cream Apply to affected area 2 times daily. 30 g 0   diclofenac Sodium (VOLTAREN) 1 % GEL APPLY 2 TO 4 GRAMS TO AFFECTED AREA(S) 4 TIMES DAILY 300 g 2   Fluticasone-Umeclidin-Vilant (TRELEGY ELLIPTA) 200-62.5-25 MCG/ACT AEPB Inhale 1 puff into the lungs daily. Rinse mouth after each use. 60 each 5   Fluticasone-Umeclidin-Vilant (TRELEGY ELLIPTA) 200-62.5-25 MCG/ACT AEPB Inhale 1 puff into the lungs daily. Rinse mouth after each use. 60 each 5   levalbuterol (XOPENEX HFA) 45 MCG/ACT inhaler Inhale 2 puffs into the lungs every 4 (four) hours as needed for wheezing or shortness of breath (coughing fits). 15 g 2   levalbuterol (XOPENEX) 0.63 MG/3ML nebulizer solution Inhale 1 vial via nebulizer every 4 (four) hours as needed for wheezing or shortness of breath (coughing fits). 75 mL 1   montelukast (SINGULAIR) 10 MG tablet Take 1 tablet (10 mg total) by mouth at bedtime. 90 tablet 2   nitroGLYCERIN (NITRODUR - DOSED IN MG/24 HR) 0.2 mg/hr patch Apply 1/4 patch daily to tendon for tendonitis. 30 patch 1   Olopatadine-Mometasone (RYALTRIS) 665-25 MCG/ACT SUSP Place 1-2 sprays into the nose in the morning and at bedtime. 29 g 5   sildenafil (VIAGRA) 100 MG tablet Take 1 tablet (100 mg) by mouth as directed as needed 30 tablet 11   tezepelumab-ekko (TEZSPIRE) 210 MG/1. syringe Inject 1.91 mLs (210 mg total) into the skin every 28 (twenty-eight) days. 1.91 mL 11   Current Facility-Administered Medications  Medication Dose Route Frequency Provider Last Rate Last Admin   tezepelumab-ekko (TEZSPIRE) 210 MG/1. syringe 210 mg  210 mg Subcutaneous Q28 days Marcelyn Bruins, MD   210 mg at 08/22/23 1191   Allergies: No Known Allergies I reviewed his past medical history, social history, family history, and environmental history and no significant changes have been reported from his previous visit.  Review of Systems   Constitutional:  Negative for appetite change, chills, fever and unexpected weight change.  HENT:  Negative for congestion and rhinorrhea.   Eyes:  Negative for itching.  Respiratory:  Negative for cough, chest tightness, shortness of breath and wheezing.   Cardiovascular:  Negative for chest pain.  Gastrointestinal:  Negative for abdominal pain.  Genitourinary:  Negative for difficulty urinating.  Skin:  Negative for rash.  Neurological:  Negative for headaches.    Objective: BP (!) 142/90   Pulse (!) 101   Temp (!) 96.8 F (36 C)   Resp 18   Wt 157 lb 1.6 oz (71.3 kg)   SpO2 98%   BMI 21.91 kg/m  Body mass index is 21.91 kg/m. Physical Exam Vitals and nursing note reviewed.  Constitutional:      Appearance: Normal appearance. He is well-developed.  HENT:     Head: Normocephalic and atraumatic.     Right Ear: Tympanic membrane and external ear normal.     Left Ear: Tympanic membrane and external ear normal.     Nose: Nose normal.     Mouth/Throat:     Mouth: Mucous membranes are moist.     Pharynx: Oropharynx is clear.     Comments: Poor dentition Eyes:     Conjunctiva/sclera:  Conjunctivae normal.  Cardiovascular:     Rate and Rhythm: Normal rate and regular rhythm.     Heart sounds: Normal heart sounds. No murmur heard.    No friction rub. No gallop.  Pulmonary:     Effort: Pulmonary effort is normal.     Breath sounds: Normal breath sounds. No wheezing, rhonchi or rales.  Musculoskeletal:     Cervical back: Neck supple.  Skin:    General: Skin is warm.     Findings: No rash.  Neurological:     Mental Status: He is alert and oriented to person, place, and time.  Psychiatric:        Behavior: Behavior normal.   Previous notes and tests were reviewed. The plan was reviewed with the patient/family, and all questions/concerned were addressed.  It was my pleasure to see [name] today and participate in his care. Please feel free to contact me with any questions  or concerns.  Sincerely,  Wyline Mood, DO Allergy & Immunology  [facility] office: [phone] [facility] office: [phone]

## 2023-09-05 ENCOUNTER — Encounter: Payer: Self-pay | Admitting: Internal Medicine

## 2023-09-05 ENCOUNTER — Ambulatory Visit (INDEPENDENT_AMBULATORY_CARE_PROVIDER_SITE_OTHER): Payer: Commercial Managed Care - PPO | Admitting: Internal Medicine

## 2023-09-05 VITALS — BP 141/102 | HR 100 | Temp 98.3°F | Resp 16 | Ht 71.0 in | Wt 155.6 lb

## 2023-09-05 DIAGNOSIS — Z23 Encounter for immunization: Secondary | ICD-10-CM | POA: Diagnosis not present

## 2023-09-05 DIAGNOSIS — D869 Sarcoidosis, unspecified: Secondary | ICD-10-CM | POA: Diagnosis not present

## 2023-09-05 DIAGNOSIS — I10 Essential (primary) hypertension: Secondary | ICD-10-CM | POA: Diagnosis not present

## 2023-09-05 DIAGNOSIS — R778 Other specified abnormalities of plasma proteins: Secondary | ICD-10-CM

## 2023-09-05 DIAGNOSIS — J4550 Severe persistent asthma, uncomplicated: Secondary | ICD-10-CM | POA: Diagnosis not present

## 2023-09-05 DIAGNOSIS — Z0001 Encounter for general adult medical examination with abnormal findings: Secondary | ICD-10-CM

## 2023-09-05 LAB — LIPID PANEL
Cholesterol: 220 mg/dL — ABNORMAL HIGH (ref 0–200)
HDL: 60.7 mg/dL (ref >39.00)
LDL Cholesterol: 125 mg/dL — ABNORMAL HIGH (ref 0–99)
NonHDL: 159.4
Total CHOL/HDL Ratio: 4
Triglycerides: 170 mg/dL — ABNORMAL HIGH (ref 0.0–149.0)
VLDL: 34 mg/dL (ref 0.0–40.0)

## 2023-09-05 LAB — URINALYSIS, ROUTINE W REFLEX MICROSCOPIC
Bilirubin Urine: NEGATIVE
Hgb urine dipstick: NEGATIVE
Leukocytes,Ua: NEGATIVE
Nitrite: NEGATIVE
RBC / HPF: NONE SEEN (ref 0–?)
Specific Gravity, Urine: 1.02 (ref 1.000–1.030)
Total Protein, Urine: NEGATIVE
Urine Glucose: NEGATIVE
Urobilinogen, UA: 0.2 (ref 0.0–1.0)
WBC, UA: NONE SEEN (ref 0–?)
pH: 7.5 (ref 5.0–8.0)

## 2023-09-05 LAB — CBC WITH DIFFERENTIAL/PLATELET
Basophils Absolute: 0 10*3/uL (ref 0.0–0.1)
Basophils Relative: 0.5 % (ref 0.0–3.0)
Eosinophils Absolute: 0.1 10*3/uL (ref 0.0–0.7)
Eosinophils Relative: 1.4 % (ref 0.0–5.0)
HCT: 45.6 % (ref 39.0–52.0)
Hemoglobin: 15.8 g/dL (ref 13.0–17.0)
Lymphocytes Relative: 30.2 % (ref 12.0–46.0)
Lymphs Abs: 1.5 10*3/uL (ref 0.7–4.0)
MCHC: 34.6 g/dL (ref 30.0–36.0)
MCV: 83 fL (ref 78.0–100.0)
Monocytes Absolute: 0.7 10*3/uL (ref 0.1–1.0)
Monocytes Relative: 14.2 % — ABNORMAL HIGH (ref 3.0–12.0)
Neutro Abs: 2.8 10*3/uL (ref 1.4–7.7)
Neutrophils Relative %: 53.7 % (ref 43.0–77.0)
Platelets: 269 10*3/uL (ref 150.0–400.0)
RBC: 5.49 Mil/uL (ref 4.22–5.81)
RDW: 14.3 % (ref 11.5–15.5)
WBC: 5.1 10*3/uL (ref 4.0–10.5)

## 2023-09-05 LAB — BASIC METABOLIC PANEL
BUN: 15 mg/dL (ref 6–23)
CO2: 32 meq/L (ref 19–32)
Calcium: 9.6 mg/dL (ref 8.4–10.5)
Chloride: 100 meq/L (ref 96–112)
Creatinine, Ser: 1 mg/dL (ref 0.40–1.50)
GFR: 84.83 mL/min (ref >60.00)
Glucose, Bld: 92 mg/dL (ref 70–99)
Potassium: 4.2 meq/L (ref 3.5–5.1)
Sodium: 138 meq/L (ref 135–145)

## 2023-09-05 LAB — HEPATIC FUNCTION PANEL
ALT: 31 U/L (ref 0–53)
AST: 29 U/L (ref 0–37)
Albumin: 4.8 g/dL (ref 3.5–5.2)
Alkaline Phosphatase: 68 U/L (ref 39–117)
Bilirubin, Direct: 0.1 mg/dL (ref 0.0–0.3)
Total Bilirubin: 0.5 mg/dL (ref 0.2–1.2)
Total Protein: 8.6 g/dL — ABNORMAL HIGH (ref 6.0–8.3)

## 2023-09-05 LAB — TSH: TSH: 3.25 u[IU]/mL (ref 0.35–5.50)

## 2023-09-05 LAB — PSA: PSA: 2.35 ng/mL (ref 0.10–4.00)

## 2023-09-05 NOTE — Progress Notes (Signed)
Subjective:  Patient ID: [name], male    DOB: 12/11/1965  Age: 57 y.o. MRN: [id number]  CC: Annual Exam, Hypertension, and Hyperlipidemia   HPI [name] presents for a CPX and f/up ---  Discussed the use of AI scribe software for clinical note transcription with the patient, who gave verbal consent to proceed.  History of Present Illness   The patient, with a history of hypertension, presents for a routine follow-up. He reports that his blood pressure has been consistently high, despite regular monitoring. He denies any associated symptoms such as headache, blurred vision, chest pain, or cold sweats. He also denies any palpitations, despite a recorded heart rate of 100.  The patient is also dealing with dental issues, having lost a bottom tooth. He is scheduled for deep cleaning and is considering either a partial or an implant.  In addition to these concerns, the patient has been experiencing emotional distress due to the recent loss of a close family member, who passed away from lung cancer.  The patient is currently on a regimen of blood pressure medication, the specifics of which are not mentioned in the conversation. He expresses a willingness to return to his blood pressure medication, given his consistently high readings.  The patient denies any abdominal swelling or discomfort.       Outpatient Medications Prior to Visit  Medication Sig Dispense Refill   diclofenac Sodium (VOLTAREN) 1 % GEL APPLY 2 TO 4 GRAMS TO AFFECTED AREA(S) 4 TIMES DAILY 300 g 2   levalbuterol (XOPENEX HFA) 45 MCG/ACT inhaler Inhale 2 puffs into the lungs every 4 (four) hours as needed for wheezing or shortness of breath (coughing fits). 15 g 2   levalbuterol (XOPENEX) 0.63 MG/3ML nebulizer solution Inhale 1 vial via nebulizer every 4 (four) hours as needed for wheezing or shortness of breath (coughing fits). 75 mL 1   montelukast (SINGULAIR) 10 MG tablet Take 1 tablet (10 mg total) by mouth  at bedtime. 90 tablet 2   tezepelumab-ekko (TEZSPIRE) 210 MG/1. syringe Inject 1.91 mLs (210 mg total) into the skin every 28 (twenty-eight) days. 1.91 mL 11   clotrimazole-betamethasone (LOTRISONE) cream Apply to affected area 2 times daily. 30 g 0   Fluticasone-Umeclidin-Vilant (TRELEGY ELLIPTA) 200-62.5-25 MCG/ACT AEPB Inhale 1 puff into the lungs daily. Rinse mouth after each use. 60 each 5   Fluticasone-Umeclidin-Vilant (TRELEGY ELLIPTA) 200-62.5-25 MCG/ACT AEPB Inhale 1 puff into the lungs daily. Rinse mouth after each use. 60 each 5   nitroGLYCERIN (NITRODUR - DOSED IN MG/24 HR) 0.2 mg/hr patch Apply 1/4 patch daily to tendon for tendonitis. 30 patch 1   Olopatadine-Mometasone (RYALTRIS) 665-25 MCG/ACT SUSP Place 1-2 sprays into the nose in the morning and at bedtime. 29 g 5   sildenafil (VIAGRA) 100 MG tablet Take 1 tablet (100 mg) by mouth as directed as needed 30 tablet 11   Facility-Administered Medications Prior to Visit  Medication Dose Route Frequency Provider Last Rate Last Admin   tezepelumab-ekko (TEZSPIRE) 210 MG/1. syringe 210 mg  210 mg Subcutaneous Q28 days Marcelyn Bruins, MD   210 mg at 08/22/23 0908    ROS Review of Systems  Constitutional:  Negative for chills, diaphoresis, fatigue and fever.  HENT: Negative.    Eyes: Negative.   Respiratory:  Positive for shortness of breath. Negative for cough, chest tightness, wheezing and stridor.   Cardiovascular:  Negative for chest pain, palpitations and leg swelling.  Gastrointestinal:  Negative for abdominal pain, constipation, diarrhea, nausea  and vomiting.  Genitourinary: Negative.  Negative for difficulty urinating, dysuria and hematuria.  Musculoskeletal: Negative.  Negative for arthralgias and myalgias.  Skin: Negative.   Neurological: Negative.  Negative for dizziness, weakness, light-headedness and headaches.  Hematological:  Negative for adenopathy. Does not bruise/bleed easily.   Psychiatric/Behavioral: Negative.      Objective:  BP (!) 141/102 (BP Location: Left Arm, Patient Position: Sitting, Cuff Size: Normal) Comment: BP (R) 148/98 (L) 146/100  Pulse 100   Temp 98.3 F (36.8 C) (Oral)   Resp 16   Ht 5\' 11"  (1.803 m)   Wt 155 lb 9.6 oz (70.6 kg)   SpO2 97%   BMI 21.70 kg/m   BP Readings from Last 3 Encounters:  09/05/23 (!) 141/102  08/29/23 (!) 142/90  05/23/23 (!) 122/92    Wt Readings from Last 3 Encounters:  09/05/23 155 lb 9.6 oz (70.6 kg)  08/29/23 157 lb 1.6 oz (71.3 kg)  05/23/23 154 lb 12.8 oz (70.2 kg)    Physical Exam Vitals reviewed.  Constitutional:      Appearance: Normal appearance.  HENT:     Nose: Nose normal.     Mouth/Throat:     Mouth: Mucous membranes are moist.  Eyes:     General: No scleral icterus.    Conjunctiva/sclera: Conjunctivae normal.  Cardiovascular:     Rate and Rhythm: Normal rate and regular rhythm.     Pulses: Normal pulses.     Heart sounds: No murmur heard.    No friction rub. No gallop.     Comments: EKG---- NSR, 90 bpm LAD No LVH, Q waves, or ST/T waves  Pulmonary:     Effort: Pulmonary effort is normal.     Breath sounds: No stridor. No wheezing, rhonchi or rales.  Abdominal:     General: Abdomen is flat.     Palpations: There is no mass.     Tenderness: There is no abdominal tenderness. There is no guarding.     Hernia: No hernia is present.  Musculoskeletal:     Cervical back: Neck supple.     Right lower leg: No edema.     Left lower leg: No edema.  Lymphadenopathy:     Cervical: No cervical adenopathy.  Skin:    General: Skin is warm and dry.     Coloration: Skin is not pale.  Neurological:     General: No focal deficit present.     Mental Status: He is alert. Mental status is at baseline.  Psychiatric:        Mood and Affect: Mood normal.        Behavior: Behavior normal.     Lab Results  Component Value Date   WBC 5.1 09/05/2023   HGB 15.8 09/05/2023   HCT 45.6  09/05/2023   PLT 269.0 09/05/2023   GLUCOSE 92 09/05/2023   CHOL 220 (H) 09/05/2023   TRIG 170.0 (H) 09/05/2023   HDL 60.70 09/05/2023   LDLDIRECT 113.0 03/21/2019   LDLCALC 125 (H) 09/05/2023   ALT 31 09/05/2023   AST 29 09/05/2023   NA 138 09/05/2023   K 4.2 09/05/2023   CL 100 09/05/2023   CREATININE 1.00 09/05/2023   BUN 15 09/05/2023   CO2 32 09/05/2023   TSH 3.25 09/05/2023   PSA 2.35 09/05/2023   HGBA1C 5.8 08/30/2022    No results found.  Assessment & Plan:   Sarcoidosis (HCC)- Calcium is normal. -     Urinalysis, Routine w reflex microscopic;  Future -     Hepatic function panel; Future -     Basic metabolic panel; Future  Essential hypertension, benign- Will start a CCB and thiazide diuretic. -     TSH; Future -     Hepatic function panel; Future -     Aldosterone + renin activity w/ ratio; Future -     EKG 12-Lead -     amLODIPine Besylate; Take 1 tablet (5 mg total) by mouth daily.  Dispense: 90 tablet; Refill: 0 -     Indapamide; Take 1 tablet (1.25 mg total) by mouth daily.  Dispense: 90 tablet; Refill: 0  Severe persistent asthma without complication -     Hepatic function panel; Future -     CBC with Differential/Platelet; Future  Encounter for general adult medical examination with abnormal findings- Exam completed, labs reviewed, vaccines reviewed and updated, cancer screenings addressed, pt ed material was given.  -     Lipid panel; Future -     PSA; Future  Need for shingles vaccine -     Varicella-zoster vaccine IM  Elevated total protein- Will recheck in 3 months.  Other orders -     EKG     Follow-up: Return in about 3 months (around 12/04/2023).  Sanda Linger, MD

## 2023-09-05 NOTE — Patient Instructions (Signed)
Health Maintenance, Male Adopting a healthy lifestyle and getting preventive care are important in promoting health and wellness. Ask your health care provider about: The right schedule for you to have regular tests and exams. Things you can do on your own to prevent diseases and keep yourself healthy. What should I know about diet, weight, and exercise? Eat a healthy diet  Eat a diet that includes plenty of vegetables, fruits, low-fat dairy products, and lean protein. Do not eat a lot of foods that are high in solid fats, added sugars, or sodium. Maintain a healthy weight Body mass index (BMI) is a measurement that can be used to identify possible weight problems. It estimates body fat based on height and weight. Your health care provider can help determine your BMI and help you achieve or maintain a healthy weight. Get regular exercise Get regular exercise. This is one of the most important things you can do for your health. Most adults should: Exercise for at least 150 minutes each week. The exercise should increase your heart rate and make you sweat (moderate-intensity exercise). Do strengthening exercises at least twice a week. This is in addition to the moderate-intensity exercise. Spend less time sitting. Even light physical activity can be beneficial. Watch cholesterol and blood lipids Have your blood tested for lipids and cholesterol at 55 years of age, then have this test every 5 years. You may need to have your cholesterol levels checked more often if: Your lipid or cholesterol levels are high. You are older than 55 years of age. You are at high risk for heart disease. What should I know about cancer screening? Many types of cancers can be detected early and may often be prevented. Depending on your health history and family history, you may need to have cancer screening at various ages. This may include screening for: Colorectal cancer. Prostate cancer. Skin cancer. Lung  cancer. What should I know about heart disease, diabetes, and high blood pressure? Blood pressure and heart disease High blood pressure causes heart disease and increases the risk of stroke. This is more likely to develop in people who have high blood pressure readings or are overweight. Talk with your health care provider about your target blood pressure readings. Have your blood pressure checked: Every 3-5 years if you are 18-39 years of age. Every year if you are 40 years old or older. If you are between the ages of 65 and 75 and are a current or former smoker, ask your health care provider if you should have a one-time screening for abdominal aortic aneurysm (AAA). Diabetes Have regular diabetes screenings. This checks your fasting blood sugar level. Have the screening done: Once every three years after age 45 if you are at a normal weight and have a low risk for diabetes. More often and at a younger age if you are overweight or have a high risk for diabetes. What should I know about preventing infection? Hepatitis B If you have a higher risk for hepatitis B, you should be screened for this virus. Talk with your health care provider to find out if you are at risk for hepatitis B infection. Hepatitis C Blood testing is recommended for: Everyone born from 1945 through 1965. Anyone with known risk factors for hepatitis C. Sexually transmitted infections (STIs) You should be screened each year for STIs, including gonorrhea and chlamydia, if: You are sexually active and are younger than 55 years of age. You are older than 55 years of age and your   health care provider tells you that you are at risk for this type of infection. Your sexual activity has changed since you were last screened, and you are at increased risk for chlamydia or gonorrhea. Ask your health care provider if you are at risk. Ask your health care provider about whether you are at high risk for HIV. Your health care provider  may recommend a prescription medicine to help prevent HIV infection. If you choose to take medicine to prevent HIV, you should first get tested for HIV. You should then be tested every 3 months for as long as you are taking the medicine. Follow these instructions at home: Alcohol use Do not drink alcohol if your health care provider tells you not to drink. If you drink alcohol: Limit how much you have to 0-2 drinks a day. Know how much alcohol is in your drink. In the [place] one drink equals one 12 oz bottle of beer (355 mL), one 5 oz glass of wine (148 mL), or one 1 oz glass of hard liquor (44 mL). Lifestyle Do not use any products that contain nicotine or tobacco. These products include cigarettes, chewing tobacco, and vaping devices, such as e-cigarettes. If you need help quitting, ask your health care provider. Do not use street drugs. Do not share needles. Ask your health care provider for help if you need support or information about quitting drugs. General instructions Schedule regular health, dental, and eye exams. Stay current with your vaccines. Tell your health care provider if: You often feel depressed. You have ever been abused or do not feel safe at home. Summary Adopting a healthy lifestyle and getting preventive care are important in promoting health and wellness. Follow your health care provider's instructions about healthy diet, exercising, and getting tested or screened for diseases. Follow your health care provider's instructions on monitoring your cholesterol and blood pressure. This information is not intended to replace advice given to you by your health care provider. Make sure you discuss any questions you have with your health care provider. Document Revised: 02/09/2021 Document Reviewed: 02/09/2021 Elsevier Patient Education  2024 Elsevier Inc.  

## 2023-09-06 ENCOUNTER — Other Ambulatory Visit: Payer: Self-pay

## 2023-09-06 ENCOUNTER — Other Ambulatory Visit (HOSPITAL_COMMUNITY): Payer: Self-pay

## 2023-09-06 DIAGNOSIS — R778 Other specified abnormalities of plasma proteins: Secondary | ICD-10-CM | POA: Insufficient documentation

## 2023-09-06 DIAGNOSIS — Z23 Encounter for immunization: Secondary | ICD-10-CM | POA: Insufficient documentation

## 2023-09-06 MED ORDER — AMLODIPINE BESYLATE 5 MG PO TABS
5.0000 mg | ORAL_TABLET | Freq: Every day | ORAL | 0 refills | Status: DC
  Filled 2023-09-06: qty 90, 90d supply, fill #0

## 2023-09-06 MED ORDER — INDAPAMIDE 1.25 MG PO TABS
1.2500 mg | ORAL_TABLET | Freq: Every day | ORAL | 0 refills | Status: DC
  Filled 2023-09-06: qty 90, 90d supply, fill #0

## 2023-09-07 ENCOUNTER — Other Ambulatory Visit: Payer: Self-pay

## 2023-09-07 ENCOUNTER — Encounter (HOSPITAL_COMMUNITY): Payer: Self-pay

## 2023-09-07 NOTE — Progress Notes (Signed)
Specialty Pharmacy Refill Coordination Note  Quang Kubes is a 57 y.o. male contacted today regarding refills of specialty medication(s) [facility]   Patient requested Courier to Provider Office   Delivery date: 09/15/23   Verified address: [facility], [address] [place], [place], [address]   Medication will be filled on 09/14/23.

## 2023-09-07 NOTE — Progress Notes (Signed)
Specialty Pharmacy Ongoing Clinical Assessment Note  [name] is a 57 y.o. male who is being followed by the specialty pharmacy service for RxSp Asthma/COPD   Patient's specialty medication(s) reviewed today: Tezepelumab-Ekko   Missed doses in the last 4 weeks: 0   Patient/Caregiver did not have any additional questions or concerns.   Therapeutic benefit summary: Patient is achieving benefit   Adverse events/side effects summary: No adverse events/side effects   Patient's therapy is appropriate to: Continue    Goals Addressed             This Visit's Progress    Minimize recurrence of flares       Patient is on track. Patient will maintain adherence         Follow up:  6 months  Bobette Mo Specialty Pharmacist

## 2023-09-08 ENCOUNTER — Other Ambulatory Visit (HOSPITAL_COMMUNITY): Payer: Self-pay

## 2023-09-09 LAB — ALDOSTERONE + RENIN ACTIVITY W/ RATIO
ALDO / PRA Ratio: 0.5 {ratio} — ABNORMAL LOW (ref 0.9–28.9)
Aldosterone: 1 ng/dL
Renin Activity: 2.22 ng/mL/h (ref 0.25–5.82)

## 2023-09-14 ENCOUNTER — Other Ambulatory Visit: Payer: Self-pay

## 2023-09-19 ENCOUNTER — Ambulatory Visit: Payer: Commercial Managed Care - PPO

## 2023-09-20 ENCOUNTER — Ambulatory Visit (INDEPENDENT_AMBULATORY_CARE_PROVIDER_SITE_OTHER): Payer: Commercial Managed Care - PPO | Admitting: *Deleted

## 2023-09-20 DIAGNOSIS — J4550 Severe persistent asthma, uncomplicated: Secondary | ICD-10-CM | POA: Diagnosis not present

## 2023-10-07 ENCOUNTER — Other Ambulatory Visit (HOSPITAL_COMMUNITY): Payer: Self-pay

## 2023-10-07 ENCOUNTER — Other Ambulatory Visit: Payer: Self-pay

## 2023-10-07 NOTE — Progress Notes (Signed)
 Specialty Pharmacy Refill Coordination Note  [name] is a 58 y.o. male contacted today regarding refills of specialty medication(s) Tezepelumab -ekko (Tezspire )   Patient requested Courier to Provider Office   Delivery date: 10/11/23   Verified address: [facility] [address]  [place],  [place] [address]   Medication will be filled on 10/10/23.

## 2023-10-18 ENCOUNTER — Ambulatory Visit (INDEPENDENT_AMBULATORY_CARE_PROVIDER_SITE_OTHER): Payer: Commercial Managed Care - PPO | Admitting: *Deleted

## 2023-10-18 DIAGNOSIS — J4550 Severe persistent asthma, uncomplicated: Secondary | ICD-10-CM | POA: Diagnosis not present

## 2023-11-03 ENCOUNTER — Other Ambulatory Visit: Payer: Self-pay

## 2023-11-03 NOTE — Progress Notes (Signed)
Specialty Pharmacy Refill Coordination Note  [name] is a 58 y.o. male contacted today regarding refills of specialty medication(s) [name] [name])   Patient requested Courier to Provider Office   Delivery date: 11/08/23   Verified address: [facility] [address] [place] [address] [place] [address]   Medication will be filled on 11/07/23.

## 2023-11-07 ENCOUNTER — Other Ambulatory Visit: Payer: Self-pay

## 2023-11-07 NOTE — Progress Notes (Signed)
Medication requires a PA. Messaged [facility].

## 2023-11-08 ENCOUNTER — Other Ambulatory Visit: Payer: Self-pay

## 2023-11-09 ENCOUNTER — Other Ambulatory Visit: Payer: Self-pay

## 2023-11-10 ENCOUNTER — Other Ambulatory Visit: Payer: Self-pay

## 2023-11-11 ENCOUNTER — Other Ambulatory Visit (HOSPITAL_COMMUNITY): Payer: Self-pay

## 2023-11-11 ENCOUNTER — Other Ambulatory Visit: Payer: Self-pay

## 2023-11-11 MED ORDER — SILDENAFIL CITRATE 100 MG PO TABS
100.0000 mg | ORAL_TABLET | ORAL | 11 refills | Status: AC
  Filled 2023-11-11: qty 30, 30d supply, fill #0
  Filled 2024-05-31: qty 30, 30d supply, fill #1

## 2023-11-14 ENCOUNTER — Ambulatory Visit (INDEPENDENT_AMBULATORY_CARE_PROVIDER_SITE_OTHER): Payer: Commercial Managed Care - PPO | Admitting: *Deleted

## 2023-11-14 DIAGNOSIS — J4550 Severe persistent asthma, uncomplicated: Secondary | ICD-10-CM | POA: Diagnosis not present

## 2023-11-15 ENCOUNTER — Ambulatory Visit: Payer: Commercial Managed Care - PPO

## 2023-11-28 ENCOUNTER — Other Ambulatory Visit (HOSPITAL_COMMUNITY): Payer: Self-pay

## 2023-11-28 MED ORDER — HYDROCODONE-ACETAMINOPHEN 5-325 MG PO TABS
1.0000 | ORAL_TABLET | ORAL | 0 refills | Status: DC
  Filled 2023-11-28: qty 14, 3d supply, fill #0

## 2023-11-28 MED ORDER — PENICILLIN V POTASSIUM 500 MG PO TABS
500.0000 mg | ORAL_TABLET | Freq: Four times a day (QID) | ORAL | 0 refills | Status: AC
  Filled 2023-11-28: qty 28, 7d supply, fill #0

## 2023-11-30 ENCOUNTER — Other Ambulatory Visit: Payer: Self-pay | Admitting: Internal Medicine

## 2023-11-30 ENCOUNTER — Other Ambulatory Visit (HOSPITAL_COMMUNITY): Payer: Self-pay

## 2023-11-30 DIAGNOSIS — I10 Essential (primary) hypertension: Secondary | ICD-10-CM

## 2023-12-01 ENCOUNTER — Other Ambulatory Visit: Payer: Self-pay

## 2023-12-01 ENCOUNTER — Other Ambulatory Visit: Payer: Self-pay | Admitting: Pharmacy Technician

## 2023-12-01 NOTE — Progress Notes (Signed)
 Specialty Pharmacy Refill Coordination Note  [name] is a 58 y.o. male contacted today regarding refills of specialty medication(s) [name] [name])   Patient requested Courier to Provider Office   Delivery date: 12/08/23   Verified address: [facility] [address] [place] [address]   Medication will be filled on 12/07/23.

## 2023-12-05 ENCOUNTER — Other Ambulatory Visit: Payer: Self-pay

## 2023-12-05 ENCOUNTER — Ambulatory Visit: Payer: Commercial Managed Care - PPO | Admitting: Internal Medicine

## 2023-12-05 ENCOUNTER — Other Ambulatory Visit (HOSPITAL_COMMUNITY): Payer: Self-pay

## 2023-12-05 MED ORDER — AMLODIPINE BESYLATE 5 MG PO TABS
5.0000 mg | ORAL_TABLET | Freq: Every day | ORAL | 0 refills | Status: AC
Start: 2023-12-05 — End: ?
  Filled 2023-12-05: qty 90, 90d supply, fill #0

## 2023-12-05 MED ORDER — INDAPAMIDE 1.25 MG PO TABS
1.2500 mg | ORAL_TABLET | Freq: Every day | ORAL | 0 refills | Status: AC
  Filled 2023-12-05: qty 90, 90d supply, fill #0

## 2023-12-07 ENCOUNTER — Other Ambulatory Visit: Payer: Self-pay

## 2023-12-08 ENCOUNTER — Other Ambulatory Visit: Payer: Self-pay

## 2023-12-12 ENCOUNTER — Ambulatory Visit (INDEPENDENT_AMBULATORY_CARE_PROVIDER_SITE_OTHER): Payer: Commercial Managed Care - PPO

## 2023-12-12 DIAGNOSIS — J4550 Severe persistent asthma, uncomplicated: Secondary | ICD-10-CM | POA: Diagnosis not present

## 2023-12-14 ENCOUNTER — Other Ambulatory Visit (HOSPITAL_COMMUNITY): Payer: Self-pay

## 2023-12-26 ENCOUNTER — Encounter: Payer: Self-pay | Admitting: Internal Medicine

## 2023-12-26 ENCOUNTER — Ambulatory Visit (INDEPENDENT_AMBULATORY_CARE_PROVIDER_SITE_OTHER)

## 2023-12-26 ENCOUNTER — Other Ambulatory Visit (HOSPITAL_COMMUNITY): Payer: Self-pay

## 2023-12-26 ENCOUNTER — Ambulatory Visit: Admitting: Internal Medicine

## 2023-12-26 VITALS — BP 128/82 | HR 95 | Temp 98.5°F | Ht 71.0 in | Wt 157.0 lb

## 2023-12-26 DIAGNOSIS — R109 Unspecified abdominal pain: Secondary | ICD-10-CM

## 2023-12-26 DIAGNOSIS — R0781 Pleurodynia: Secondary | ICD-10-CM | POA: Diagnosis not present

## 2023-12-26 DIAGNOSIS — J4550 Severe persistent asthma, uncomplicated: Secondary | ICD-10-CM | POA: Diagnosis not present

## 2023-12-26 DIAGNOSIS — I10 Essential (primary) hypertension: Secondary | ICD-10-CM

## 2023-12-26 LAB — URINALYSIS, ROUTINE W REFLEX MICROSCOPIC
Bilirubin Urine: NEGATIVE
Hgb urine dipstick: NEGATIVE
Ketones, ur: NEGATIVE
Leukocytes,Ua: NEGATIVE
Nitrite: NEGATIVE
RBC / HPF: NONE SEEN (ref 0–?)
Specific Gravity, Urine: 1.025 (ref 1.000–1.030)
Total Protein, Urine: NEGATIVE
Urine Glucose: NEGATIVE
Urobilinogen, UA: 0.2 (ref 0.0–1.0)
WBC, UA: NONE SEEN (ref 0–?)
pH: 6 (ref 5.0–8.0)

## 2023-12-26 LAB — CBC WITH DIFFERENTIAL/PLATELET
Basophils Absolute: 0 10*3/uL (ref 0.0–0.1)
Basophils Relative: 0.5 % (ref 0.0–3.0)
Eosinophils Absolute: 0.1 10*3/uL (ref 0.0–0.7)
Eosinophils Relative: 2.1 % (ref 0.0–5.0)
HCT: 45.2 % (ref 39.0–52.0)
Hemoglobin: 16.1 g/dL (ref 13.0–17.0)
Lymphocytes Relative: 35.3 % (ref 12.0–46.0)
Lymphs Abs: 1.6 10*3/uL (ref 0.7–4.0)
MCHC: 35.6 g/dL (ref 30.0–36.0)
MCV: 81.6 fl (ref 78.0–100.0)
Monocytes Absolute: 0.5 10*3/uL (ref 0.1–1.0)
Monocytes Relative: 10.4 % (ref 3.0–12.0)
Neutro Abs: 2.4 10*3/uL (ref 1.4–7.7)
Neutrophils Relative %: 51.7 % (ref 43.0–77.0)
Platelets: 216 10*3/uL (ref 150.0–400.0)
RBC: 5.55 Mil/uL (ref 4.22–5.81)
RDW: 14.3 % (ref 11.5–15.5)
WBC: 4.7 10*3/uL (ref 4.0–10.5)

## 2023-12-26 LAB — HEPATIC FUNCTION PANEL
ALT: 20 U/L (ref 0–53)
AST: 21 U/L (ref 0–37)
Albumin: 4.8 g/dL (ref 3.5–5.2)
Alkaline Phosphatase: 62 U/L (ref 39–117)
Bilirubin, Direct: 0.1 mg/dL (ref 0.0–0.3)
Total Bilirubin: 0.5 mg/dL (ref 0.2–1.2)
Total Protein: 8.7 g/dL — ABNORMAL HIGH (ref 6.0–8.3)

## 2023-12-26 LAB — BASIC METABOLIC PANEL
BUN: 16 mg/dL (ref 6–23)
CO2: 30 meq/L (ref 19–32)
Calcium: 9.7 mg/dL (ref 8.4–10.5)
Chloride: 102 meq/L (ref 96–112)
Creatinine, Ser: 0.89 mg/dL (ref 0.40–1.50)
GFR: 96.38 mL/min (ref >60.00)
Glucose, Bld: 99 mg/dL (ref 70–99)
Potassium: 3.9 meq/L (ref 3.5–5.1)
Sodium: 138 meq/L (ref 135–145)

## 2023-12-26 MED ORDER — TRAMADOL HCL 50 MG PO TABS
50.0000 mg | ORAL_TABLET | Freq: Four times a day (QID) | ORAL | 0 refills | Status: AC | PRN
  Filled 2023-12-26 – 2024-01-06 (×2): qty 30, 8d supply, fill #0

## 2023-12-26 NOTE — Patient Instructions (Signed)
 Please take all new medication as prescribed  - the tramadol as needed  Please continue all other medications as before, and refills have been done if requested.  Please have the pharmacy call with any other refills you may need.  Please keep your appointments with your specialists as you may have planned  Please go to the XRAY Department in the first floor for the x-ray testing  Please go to the LAB at the blood drawing area for the tests to be done  You will be contacted by phone if any changes need to be made immediately.  Otherwise, you will receive a letter about your results with an explanation, but please check with MyChart first.

## 2023-12-26 NOTE — Assessment & Plan Note (Signed)
 BP Readings from Last 3 Encounters:  12/26/23 128/82  09/05/23 (!) 141/102  08/29/23 (!) 142/90   Stable, pt to continue medical treatment amlodipine 5 mg every day, lozol 1.25 mg daily

## 2023-12-26 NOTE — Assessment & Plan Note (Signed)
 Pain seems probably msk, but can't r/o other  - for xray, and labs including UA today, tramadol prn,,  to f/u any worsening symptoms or concerns

## 2023-12-26 NOTE — Assessment & Plan Note (Signed)
Stable, cont current med tx

## 2023-12-26 NOTE — Progress Notes (Signed)
 Patient ID: [name], male   DOB: 1965/10/24, 58 y.o.   MRN: [id number]        Chief Complaint: follow up right lateral side abd pain x 3 wks       HPI:  [name] is a 58 y.o. male here with c/o above, sent home from work today to rest, c/o pain ongoing constant mild to mod to right lateral abd /side pain with some soreness tender at the area but no swelling, rash or skin change. Is worse to cough and deep breathing  Pt works physically moving patients in the hospital, lifting frequently.    Pt denies other chest pain, increased sob or doe, wheezing, orthopnea, PND, increased LE swelling, palpitations, dizziness or syncope.   Pt denies polydipsia, polyuria, or new focal neuro s/s.   Denies worsening reflux, other abd pain, dysphagia, n/v, bowel change or blood.  Wt Readings from Last 3 Encounters:  12/26/23 157 lb (71.2 kg)  09/05/23 155 lb 9.6 oz (70.6 kg)  08/29/23 157 lb 1.6 oz (71.3 kg)   BP Readings from Last 3 Encounters:  12/26/23 128/82  09/05/23 (!) 141/102  08/29/23 (!) 142/90         Past Medical History:  Diagnosis Date   Asthma    Avascular necrosis (HCC)    Hypertension    Sarcoidosis    Sickle cell anemia (HCC)    Past Surgical History:  Procedure Laterality Date   I & D EXTREMITY  01/19/2012   Procedure: IRRIGATION AND DEBRIDEMENT EXTREMITY; right foot, Surgeon: Nadara Mustard, MD;  Location: [facility] OR;  Service: Orthopedics;  Laterality: Right;   SHOULDER OPEN ROTATOR CUFF REPAIR  2007   TOTAL HIP ARTHROPLASTY Right 11/10/2018   Procedure: RIGHT TOTAL HIP ARTHROPLASTY ANTERIOR APPROACH;  Surgeon: Kathryne Hitch, MD;  Location: [facility] ORS;  Service: Orthopedics;  Laterality: Right;    reports that he quit smoking about 30 years ago. His smoking use included cigarettes. He started smoking about 40 years ago. He has a 5 pack-year smoking history. He has never used smokeless tobacco. He reports current alcohol use of about 28.0 standard drinks of alcohol per  week. He reports that he does not use drugs. family history includes Alcohol abuse in his brother; Asthma in his mother; Cancer (age of onset: 56) in his father; Coronary artery disease in his mother; Drug abuse in his brother; Early death in his brother; Heart attack in his mother; Prostate cancer in his father; Sickle cell trait in his mother; Stroke in his mother. No Known Allergies Current Outpatient Medications on File Prior to Visit  Medication Sig Dispense Refill   amLODipine (NORVASC) 5 MG tablet Take 1 tablet (5 mg total) by mouth daily. 90 tablet 0   indapamide (LOZOL) 1.25 MG tablet Take 1 tablet (1.25 mg total) by mouth daily. 90 tablet 0   levalbuterol (XOPENEX HFA) 45 MCG/ACT inhaler Inhale 2 puffs into the lungs every 4 (four) hours as needed for wheezing or shortness of breath (coughing fits). 15 g 2   levalbuterol (XOPENEX) 0.63 MG/3ML nebulizer solution Inhale 1 vial via nebulizer every 4 (four) hours as needed for wheezing or shortness of breath (coughing fits). 75 mL 1   montelukast (SINGULAIR) 10 MG tablet Take 1 tablet (10 mg total) by mouth at bedtime. 90 tablet 2   penicillin v potassium (VEETID) 500 MG tablet Take 1 tablet (500 mg total) by mouth 4 (four) times daily until complete 28 tablet 0  sildenafil (VIAGRA) 100 MG tablet Take 1 tablet (100 mg total) by mouth as directed as needed 30 tablet 11   tezepelumab-ekko (TEZSPIRE) 210 MG/1. syringe Inject 1.91 mLs (210 mg total) into the skin every 28 (twenty-eight) days. 1.91 mL 11   diclofenac Sodium (VOLTAREN) 1 % GEL APPLY 2 TO 4 GRAMS TO AFFECTED AREA(S) 4 TIMES DAILY (Patient not taking: Reported on 12/26/2023) 300 g 2   HYDROcodone-acetaminophen (NORCO/VICODIN) 5-325 MG tablet Take 1 tablet by mouth every 4-6 hours as needed for pain (Patient not taking: Reported on 12/26/2023) 14 tablet 0   Current Facility-Administered Medications on File Prior to Visit  Medication Dose Route Frequency Provider Last Rate Last Admin    tezepelumab-ekko (TEZSPIRE) 210 MG/1. syringe 210 mg  210 mg Subcutaneous Q28 days Marcelyn Bruins, MD   210 mg at 12/12/23 1600        ROS:  All others reviewed and negative.  Objective        PE:  BP 128/82 (BP Location: Right Arm, Patient Position: Sitting, Cuff Size: Normal)   Pulse 95   Temp 98.5 F (36.9 C) (Oral)   Ht 5\' 11"  (1.803 m)   Wt 157 lb (71.2 kg)   SpO2 99%   BMI 21.90 kg/m                 Constitutional: Pt appears in NAD               HENT: Head: NCAT.                Right Ear: External ear normal.                 Left Ear: External ear normal.                Eyes: . Pupils are equal, round, and reactive to light. Conjunctivae and EOM are normal               Nose: without d/c or deformity               Neck: Neck supple. Gross normal ROM               Cardiovascular: Normal rate and regular rhythm.                 Pulmonary/Chest: Effort normal and breath sounds without rales or wheezing.                Abd:  Soft,  ND, + BS, no organomegaly with tender over right floating ribs               Neurological: Pt is alert. At baseline orientation, motor grossly intact               Skin: Skin is warm. No rashes, no other new lesions, LE edema - none               Psychiatric: Pt behavior is normal without agitation   Micro: none  Cardiac tracings I have personally interpreted today:  none  Pertinent Radiological findings (summarize): none   Lab Results  Component Value Date   WBC 5.1 09/05/2023   HGB 15.8 09/05/2023   HCT 45.6 09/05/2023   PLT 269.0 09/05/2023   GLUCOSE 92 09/05/2023   CHOL 220 (H) 09/05/2023   TRIG 170.0 (H) 09/05/2023   HDL 60.70 09/05/2023   LDLDIRECT 113.0 03/21/2019   LDLCALC 125 (H) 09/05/2023   ALT 31  09/05/2023   AST 29 09/05/2023   NA 138 09/05/2023   K 4.2 09/05/2023   CL 100 09/05/2023   CREATININE 1.00 09/05/2023   BUN 15 09/05/2023   CO2 32 09/05/2023   TSH 3.25 09/05/2023   PSA 2.35 09/05/2023    HGBA1C 5.8 08/30/2022   Assessment/Plan:  [name] is a 58 y.o. Black or African American [2] male with  has a past medical history of Asthma, Avascular necrosis (HCC), Hypertension, Sarcoidosis, and Sickle cell anemia (HCC).  Right sided abdominal pain Pain seems probably msk, but can't r/o other  - for xray, and labs including UA today, tramadol prn,,  to f/u any worsening symptoms or concerns  Essential hypertension, benign BP Readings from Last 3 Encounters:  12/26/23 128/82  09/05/23 (!) 141/102  08/29/23 (!) 142/90   Stable, pt to continue medical treatment amlodipine 5 mg every day, lozol 1.25 mg daily   Severe persistent asthma without complication Stable, cont current med tx  Followup: Return if symptoms worsen or fail to improve.  Oliver Barre, MD 12/26/2023 12:14 PM [facility]

## 2023-12-26 NOTE — Progress Notes (Signed)
 The test results show that your current treatment is OK, as the tests are stable.  The xrays are still pending.  Please continue the same plan.  There is no other need for change of treatment or further evaluation based on these results, at this time.  thanks

## 2024-01-02 ENCOUNTER — Other Ambulatory Visit: Payer: Self-pay

## 2024-01-02 NOTE — Progress Notes (Signed)
 Specialty Pharmacy Refill Coordination Note  [name] is a 58 y.o. male contacted today regarding refills of specialty medication(s) [name] [name])   Patient requested Courier to Provider Office   Delivery date: 01/04/24   Verified address: [facility] [address] [place] [address] [place] [address]   Medication will be filled on 01/03/24.

## 2024-01-03 ENCOUNTER — Other Ambulatory Visit: Payer: Self-pay

## 2024-01-04 ENCOUNTER — Other Ambulatory Visit (HOSPITAL_COMMUNITY): Payer: Self-pay

## 2024-01-05 ENCOUNTER — Encounter: Payer: Self-pay | Admitting: Internal Medicine

## 2024-01-06 ENCOUNTER — Other Ambulatory Visit (HOSPITAL_COMMUNITY): Payer: Self-pay

## 2024-01-09 ENCOUNTER — Ambulatory Visit

## 2024-01-10 ENCOUNTER — Ambulatory Visit

## 2024-01-10 DIAGNOSIS — J4550 Severe persistent asthma, uncomplicated: Secondary | ICD-10-CM

## 2024-01-30 ENCOUNTER — Other Ambulatory Visit: Payer: Self-pay

## 2024-01-30 ENCOUNTER — Other Ambulatory Visit (HOSPITAL_COMMUNITY): Payer: Self-pay

## 2024-01-30 NOTE — Progress Notes (Signed)
 Specialty Pharmacy Refill Coordination Note  [name] is a 58 y.o. male contacted today regarding refills of specialty medication(s) Tezepelumab -ekko (Tezspire )   Patient requested Courier to Provider Office   Delivery date: 02/01/24   Verified address: [facility] [address] [place] [address]   [place] [address]   Medication will be filled on 01/31/24.

## 2024-01-31 ENCOUNTER — Other Ambulatory Visit: Payer: Self-pay

## 2024-02-07 ENCOUNTER — Ambulatory Visit

## 2024-02-07 DIAGNOSIS — J4550 Severe persistent asthma, uncomplicated: Secondary | ICD-10-CM

## 2024-02-13 ENCOUNTER — Other Ambulatory Visit: Payer: Self-pay

## 2024-02-13 ENCOUNTER — Encounter: Payer: Self-pay | Admitting: Physician Assistant

## 2024-02-13 ENCOUNTER — Other Ambulatory Visit (HOSPITAL_COMMUNITY): Payer: Self-pay

## 2024-02-13 ENCOUNTER — Ambulatory Visit: Admitting: Physician Assistant

## 2024-02-13 DIAGNOSIS — M7061 Trochanteric bursitis, right hip: Secondary | ICD-10-CM | POA: Diagnosis not present

## 2024-02-13 DIAGNOSIS — M25511 Pain in right shoulder: Secondary | ICD-10-CM | POA: Diagnosis not present

## 2024-02-13 MED ORDER — DICLOFENAC SODIUM 75 MG PO TBEC
75.0000 mg | DELAYED_RELEASE_TABLET | Freq: Two times a day (BID) | ORAL | 1 refills | Status: DC
  Filled 2024-02-13: qty 30, 15d supply, fill #0
  Filled 2024-07-19: qty 30, 15d supply, fill #1

## 2024-02-13 MED ORDER — METHYLPREDNISOLONE ACETATE 40 MG/ML IJ SUSP
40.0000 mg | INTRAMUSCULAR | Status: AC | PRN
  Administered 2024-02-13: 40 mg via INTRA_ARTICULAR

## 2024-02-13 MED ORDER — LIDOCAINE HCL 1 % IJ SOLN
3.0000 mL | INTRAMUSCULAR | Status: AC | PRN
  Administered 2024-02-13: 3 mL

## 2024-02-13 NOTE — Progress Notes (Signed)
 HPI: [name] comes in today with right hip pain.  We last saw him in 2023 for right hip pain trochanteric bursitis.  History of right total hip arthroplasty 11/10/2018.  No known injury.  He is performing stretching exercises for his hips.  He is using Voltaren  gel. New problem right shoulder pain for the last 3 weeks no known injury.  History of right shoulder arthroscopy with with debridement and labral tear repair 2007 by Dr. Lucienne Ryder.  No new injury.  Voltaren  gel does help some.  Shoulder pain is worse with overhead activity and awakens him at times.  No numbness tingling down the arms.  No neck pain.  Describes it as a throbbing pain toothache like points to the anterior aspect of the shoulder.  He has taken ibuprofen  which really does not help much.  Review of systems: Negative for fevers or chills.  Physical exam: General Well-developed well-nourished male no acute distress. Bilateral hips: Good range of motion without pain.  Tenderness over the right hip trochanteric region. Bilateral shoulders: 5 out of 5 strength external and internal rotation against resistance empty can test negative bilaterally.  Liftoff test negative bilaterally.  Impingement testing negative bilaterally.  Crepitus with passive range of motion of the right shoulder with internal/external rotation.  5 out of 5 strengths biceps triceps bilaterally against resistance.  Radiographs: AP pelvis and lateral view right hip: No acute fractures.  Heterotopic bone again is seen superior to the greater trochanter and off the greater trochanter.  Heterotopic bone remains basically unchanged from prior films.  Total hip arthroplasty components right hip are well-seated with no complicating features.  Right shoulder 3 views: No acute fracture.  Shoulder is well located.  Subacromial space well-maintained on the Y view   Moderate narrowing on the axillary view.   Impression: Right hip trochanteric bursitis Right shoulder  pain/arthritis  Plan: Will send him for an intra-articular injection of the right shoulder with Dr. Vaughn Georges.  Offered him a right hip trochanteric injection which he was agreeable with today.  However if the trochanteric injection does not help he may benefit from removal of heterotopic bone in the future.  Continue IT band stretching exercises.  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  Follow-up as needed.     Procedure Note  Patient: [name]             Date of Birth: 1965/12/05           MRN: [id number]             Visit Date: 02/13/2024  Procedures: Visit Diagnoses:  1. Trochanteric bursitis, right hip   2. Acute pain of right shoulder     Large Joint Inj on 02/13/2024 5:38 PM Indications: pain Details: 22 G 1.5 in needle, lateral approach  Arthrogram: No  Medications: 3 mL lidocaine  1 %; 40 mg methylPREDNISolone  acetate 40 MG/ML Outcome: tolerated well, no immediate complications Procedure, treatment alternatives, risks and benefits explained, specific risks discussed. Consent was given by the patient. Immediately prior to procedure a time out was called to verify the correct patient, procedure, equipment, support staff and site/side marked as required. Patient was prepped and draped in the usual sterile fashion.

## 2024-02-14 ENCOUNTER — Other Ambulatory Visit: Payer: Self-pay

## 2024-02-14 DIAGNOSIS — M25511 Pain in right shoulder: Secondary | ICD-10-CM

## 2024-02-22 ENCOUNTER — Other Ambulatory Visit: Payer: Self-pay

## 2024-02-22 NOTE — Progress Notes (Signed)
 Specialty Pharmacy Refill Coordination Note  [name] is a 58 y.o. male contacted today regarding refills of specialty medication(s) Tezepelumab -ekko (Tezspire )   Patient requested Courier to Provider Office   Delivery date: 03/01/24   Verified address: [facility] [address] [place] [address]   [place] [address]   Medication will be filled on 02/29/24.

## 2024-02-22 NOTE — Progress Notes (Signed)
 Specialty Pharmacy Ongoing Clinical Assessment Note  [name] is a 58 y.o. male who is being followed by the specialty pharmacy service for RxSp Asthma/COPD   Patient's specialty medication(s) reviewed today: Tezepelumab -ekko (Tezspire )   Missed doses in the last 4 weeks: 0   Patient/Caregiver did not have any additional questions or concerns.   Therapeutic benefit summary: Patient is achieving benefit   Adverse events/side effects summary: No adverse events/side effects   Patient's therapy is appropriate to: Continue    Goals Addressed             This Visit's Progress    Minimize recurrence of flares   On track    Patient is on track. Patient will maintain adherence. Patient reported he has had no flares and breathing is well controlled.         Follow up: 6 months  [facility]

## 2024-02-28 DIAGNOSIS — N402 Nodular prostate without lower urinary tract symptoms: Secondary | ICD-10-CM | POA: Diagnosis not present

## 2024-02-28 NOTE — Progress Notes (Deleted)
 Follow Up Note  RE: [name] MRN: [id number] DOB: 02-28-1966 Date of Office Visit: 02/29/2024  Referring provider: Arcadio Knuckles, MD Primary care provider: Arcadio Knuckles, MD  Chief Complaint: No chief complaint on file.  History of Present Illness: I had the pleasure of seeing [name] for a follow up visit at the [facility] on 02/28/2024. He is a 58 y.o. male, who is being followed for asthma on Tezspire  and allergic rhinitis. His previous allergy office visit was on 08/29/2023 with Dr. Burdette Carolin. Today is a regular follow up visit.  Discussed the use of AI scribe software for clinical note transcription with the patient, who gave verbal consent to proceed.  History of Present Illness            ***  Assessment and Plan: [name] is a 58 y.o. male with: Severe persistent asthma without complication Past history - sarcoidosis.  Interim history - no prednisone .  Inconsistent use of Trelegy (1 puff 3-4 times per week) Today's spirometry showed moderate obstruction - some improvement from previous one.  Daily controller medication(s): take Trelegy 200mcg 1puff EVERYDAY and rinse mouth after each use.  If it's not covered let me know. Samples given.  Continue Tezspire  injection every 4 weeks.  Continue montelukast  (singulair ) 10mg  daily at night. May use levoalbuterol rescue inhaler 2 puffs or nebulizer every 4 to 6 hours as needed for shortness of breath, chest tightness, coughing, and wheezing. May use levoalbuterol rescue inhaler 2 puffs 5 to 15 minutes prior to strenuous physical activities. Monitor frequency of use - if you need to use it more than twice per week on a consistent basis let us  know.  Get spirometry at next visit.   Seasonal allergic rhinitis due to pollen Allergic rhinitis due to animal dander Allergic rhinitis due to dust mite Allergic rhinitis due to mold Allergy to cockroaches Past history - 2019 skin testing positive to  grass, weed, ragweed, trees, mold, dust mites, cockroach, cat and dog.  Interim history - well controlled with Singulair  daily.  Continue montelukast  (Singulair ) 10mg  daily at night. Use over the counter antihistamines such as Zyrtec (cetirizine), Claritin (loratadine), Allegra (fexofenadine), or Xyzal  (levocetirizine) daily as needed. May take twice a day during allergy flares. May switch antihistamines every few months. May use Ryaltris  (olopatadine  + mometasone  nasal spray combination) 1-2 sprays per nostril twice a day.    Elevated blood pressure Follow up with PCP regarding this.   Assessment and Plan              No follow-ups on file.  No orders of the defined types were placed in this encounter.  Lab Orders  No laboratory test(s) ordered today    Diagnostics: Spirometry:  Tracings reviewed. His effort: {Blank single:19197::"Good reproducible efforts.","It was hard to get consistent efforts and there is a question as to whether this reflects a maximal maneuver.","Poor effort, data can not be interpreted."} FVC: ***L FEV1: ***L, ***% predicted FEV1/FVC ratio: ***% Interpretation: {Blank single:19197::"Spirometry consistent with mild obstructive disease","Spirometry consistent with moderate obstructive disease","Spirometry consistent with severe obstructive disease","Spirometry consistent with possible restrictive disease","Spirometry consistent with mixed obstructive and restrictive disease","Spirometry uninterpretable due to technique","Spirometry consistent with normal pattern","No overt abnormalities noted given today's efforts"}.  Please see scanned spirometry results for details.  Skin Testing: {Blank single:19197::"Select foods","Environmental allergy panel","Environmental allergy panel and select foods","Food allergy panel","None","Deferred due to recent antihistamines use"}. *** Results discussed with patient/family.   Medication List:  Current Outpatient  Medications  Medication Sig Dispense Refill  . amLODipine  (NORVASC ) 5 MG tablet Take 1 tablet (5 mg total) by mouth daily. 90 tablet 0  . diclofenac  (VOLTAREN ) 75 MG EC tablet Take 1 tablet (75 mg total) by mouth 2 (two) times daily. 30 tablet 1  . indapamide  (LOZOL ) 1.25 MG tablet Take 1 tablet (1.25 mg total) by mouth daily. 90 tablet 0  . levalbuterol  (XOPENEX  HFA) 45 MCG/ACT inhaler Inhale 2 puffs into the lungs every 4 (four) hours as needed for wheezing or shortness of breath (coughing fits). 15 g 2  . levalbuterol  (XOPENEX ) 0.63 MG/3ML nebulizer solution Inhale 1 vial via nebulizer every 4 (four) hours as needed for wheezing or shortness of breath (coughing fits). 75 mL 1  . montelukast  (SINGULAIR ) 10 MG tablet Take 1 tablet (10 mg total) by mouth at bedtime. 90 tablet 2  . penicillin  v potassium (VEETID) 500 MG tablet Take 1 tablet (500 mg total) by mouth 4 (four) times daily until complete 28 tablet 0  . sildenafil  (VIAGRA ) 100 MG tablet Take 1 tablet (100 mg total) by mouth as directed as needed 30 tablet 11  . tezepelumab -ekko (TEZSPIRE ) 210 MG/1. syringe Inject 1.91 mLs (210 mg total) into the skin every 28 (twenty-eight) days. 1.91 mL 11  . traMADol  (ULTRAM ) 50 MG tablet Take 1 tablet (50 mg total) by mouth every 6 (six) hours as needed. 30 tablet 0   Current Facility-Administered Medications  Medication Dose Route Frequency Provider Last Rate Last Admin  . tezepelumab -ekko (TEZSPIRE ) 210 MG/1. syringe 210 mg  210 mg Subcutaneous Q28 days Brian Campanile, MD   210 mg at 02/07/24 1524   Allergies: No Known Allergies I reviewed his past medical history, social history, family history, and environmental history and no significant changes have been reported from his previous visit.  Review of Systems  Constitutional:  Negative for appetite change, chills, fever and unexpected weight change.  HENT:  Negative for congestion and rhinorrhea.   Eyes:  Negative for  itching.  Respiratory:  Negative for cough, chest tightness, shortness of breath and wheezing.   Cardiovascular:  Negative for chest pain.  Gastrointestinal:  Negative for abdominal pain.  Genitourinary:  Negative for difficulty urinating.  Skin:  Negative for rash.  Allergic/Immunologic: Positive for environmental allergies.  Neurological:  Negative for headaches.   Objective: There were no vitals taken for this visit. There is no height or weight on file to calculate BMI. Physical Exam Vitals and nursing note reviewed.  Constitutional:      Appearance: Normal appearance. He is well-developed.  HENT:     Head: Normocephalic and atraumatic.     Right Ear: Tympanic membrane and external ear normal.     Left Ear: Tympanic membrane and external ear normal.     Nose: Nose normal.     Mouth/Throat:     Mouth: Mucous membranes are moist.     Pharynx: Oropharynx is clear.     Comments: Poor dentition Eyes:     Conjunctiva/sclera: Conjunctivae normal.  Cardiovascular:     Rate and Rhythm: Normal rate and regular rhythm.     Heart sounds: Normal heart sounds. No murmur heard.    No friction rub. No gallop.  Pulmonary:     Effort: Pulmonary effort is normal.     Breath sounds: Normal breath sounds. No wheezing, rhonchi or rales.  Musculoskeletal:     Cervical back: Neck supple.  Skin:    General: Skin is warm.  Findings: No rash.  Neurological:     Mental Status: He is alert and oriented to person, place, and time.  Psychiatric:        Behavior: Behavior normal.  Previous notes and tests were reviewed. The plan was reviewed with the patient/family, and all questions/concerned were addressed.  It was my pleasure to see Dayyan today and participate in his care. Please feel free to contact me with any questions or concerns.  Sincerely,  Eudelia Hero, DO Allergy & Immunology  [facility]  [facility] office: [phone] [facility] office:  [phone]

## 2024-02-29 ENCOUNTER — Ambulatory Visit: Payer: Commercial Managed Care - PPO | Admitting: Allergy

## 2024-03-01 ENCOUNTER — Encounter: Payer: Self-pay | Admitting: Sports Medicine

## 2024-03-01 ENCOUNTER — Ambulatory Visit: Admitting: Sports Medicine

## 2024-03-01 ENCOUNTER — Other Ambulatory Visit: Payer: Self-pay

## 2024-03-01 DIAGNOSIS — G8929 Other chronic pain: Secondary | ICD-10-CM | POA: Diagnosis not present

## 2024-03-01 DIAGNOSIS — M25511 Pain in right shoulder: Secondary | ICD-10-CM

## 2024-03-01 MED ORDER — METHYLPREDNISOLONE ACETATE 40 MG/ML IJ SUSP
80.0000 mg | INTRAMUSCULAR | Status: AC | PRN
  Administered 2024-03-01: 80 mg via INTRA_ARTICULAR

## 2024-03-01 MED ORDER — BUPIVACAINE HCL 0.25 % IJ SOLN
2.0000 mL | INTRAMUSCULAR | Status: AC | PRN
Start: 2024-03-01 — End: 2024-03-01
  Administered 2024-03-01: 2 mL via INTRA_ARTICULAR

## 2024-03-01 MED ORDER — LIDOCAINE HCL 1 % IJ SOLN
2.0000 mL | INTRAMUSCULAR | Status: AC | PRN
  Administered 2024-03-01: 2 mL

## 2024-03-01 NOTE — Progress Notes (Signed)
   Procedure Note  Patient: [name]             Date of Birth: Jan 07, 1966           MRN: [id number]             Visit Date: 03/01/2024  Procedures: Visit Diagnoses:  1. Chronic right shoulder pain    Large Joint Inj: R glenohumeral on 03/01/2024 1:33 PM Indications: pain Details: 22 G 3.5 in needle, ultrasound-guided posterior approach Medications: 2 mL lidocaine  1 %; 2 mL bupivacaine  0.25 %; 80 mg methylPREDNISolone  acetate 40 MG/ML Outcome: tolerated well, no immediate complications  US -guided glenohumeral joint injection, Right shoulder After discussion on risks/benefits/indications, informed verbal consent was obtained. A timeout was then performed. The patient was positioned lying lateral recumbent on examination table. The patient's shoulder was prepped with betadine and multiple alcohol swabs and utilizing ultrasound guidance, the patient's glenohumeral joint was identified on ultrasound. Using ultrasound guidance a 22-gauge, 3.5 inch needle with a mixture of 2:2:2 cc's lidocaine :bupivicaine:depomedrol was directed from a lateral to medial direction via in-plane technique into the glenohumeral joint with visualization of appropriate spread of injectate into the joint. Patient tolerated the procedure well without immediate complications.      Procedure, treatment alternatives, risks and benefits explained, specific risks discussed. Consent was given by the patient. Immediately prior to procedure a time out was called to verify the correct patient, procedure, equipment, support staff and site/side marked as required. Patient was prepped and draped in the usual sterile fashion.     - patient tolerated procedure well, discussed post-injection protocol - note provided for work today - follow-up with Dr. Monnie Morris, PAC as indicated; I am happy to see them as needed  Ryan Del, DO Primary Care Sports Medicine e Physician  [facility]  This  note was dictated using Dragon naturally speaking software and may contain errors in syntax, spelling, or content which have not been identified prior to signing this note.

## 2024-03-06 ENCOUNTER — Ambulatory Visit

## 2024-03-06 DIAGNOSIS — J4550 Severe persistent asthma, uncomplicated: Secondary | ICD-10-CM | POA: Diagnosis not present

## 2024-03-06 DIAGNOSIS — R972 Elevated prostate specific antigen [PSA]: Secondary | ICD-10-CM | POA: Diagnosis not present

## 2024-03-06 DIAGNOSIS — N402 Nodular prostate without lower urinary tract symptoms: Secondary | ICD-10-CM | POA: Diagnosis not present

## 2024-03-14 ENCOUNTER — Ambulatory Visit: Admitting: Podiatry

## 2024-03-14 DIAGNOSIS — M19071 Primary osteoarthritis, right ankle and foot: Secondary | ICD-10-CM

## 2024-03-14 DIAGNOSIS — M7751 Other enthesopathy of right foot: Secondary | ICD-10-CM

## 2024-03-14 NOTE — Progress Notes (Signed)
 With  Subjective:  Patient ID: [name], male    DOB: 15-Jun-1966,  MRN: [id number]  Chief Complaint  Patient presents with   Nail Problem    Nail trim/ discomfort     58 y.o. male presents with the above complaint.  Patient presented right hallux IPJ pain.  Patient states painful to touch is progressive gotten worse worse with ambulation worse with pressure he works a lot on his foot.  Wanted to get it evaluated just came out of nowhere.  It is mostly at the little toe joint.  Discomfort with ambulation pain scale 7 out of 10 dull aching nature   Review of Systems: Negative except as noted in the HPI. Denies N/V/F/Ch.  Past Medical History:  Diagnosis Date   Asthma    Avascular necrosis (HCC)    Hypertension    Sarcoidosis    Sickle cell anemia (HCC)     Current Outpatient Medications:    amLODipine  (NORVASC ) 5 MG tablet, Take 1 tablet (5 mg total) by mouth daily., Disp: 90 tablet, Rfl: 0   diclofenac  (VOLTAREN ) 75 MG EC tablet, Take 1 tablet (75 mg total) by mouth 2 (two) times daily., Disp: 30 tablet, Rfl: 1   indapamide  (LOZOL ) 1.25 MG tablet, Take 1 tablet (1.25 mg total) by mouth daily., Disp: 90 tablet, Rfl: 0   levalbuterol  (XOPENEX  HFA) 45 MCG/ACT inhaler, Inhale 2 puffs into the lungs every 4 (four) hours as needed for wheezing or shortness of breath (coughing fits)., Disp: 15 g, Rfl: 2   levalbuterol  (XOPENEX ) 0.63 MG/3ML nebulizer solution, Inhale 1 vial via nebulizer every 4 (four) hours as needed for wheezing or shortness of breath (coughing fits)., Disp: 75 mL, Rfl: 1   montelukast  (SINGULAIR ) 10 MG tablet, Take 1 tablet (10 mg total) by mouth at bedtime., Disp: 90 tablet, Rfl: 2   penicillin  v potassium (VEETID) 500 MG tablet, Take 1 tablet (500 mg total) by mouth 4 (four) times daily until complete, Disp: 28 tablet, Rfl: 0   sildenafil  (VIAGRA ) 100 MG tablet, Take 1 tablet (100 mg total) by mouth as directed as needed, Disp: 30 tablet, Rfl: 11   tezepelumab -ekko  (TEZSPIRE ) 210 MG/1. syringe, Inject 1.91 mLs (210 mg total) into the skin every 28 (twenty-eight) days., Disp: 1.91 mL, Rfl: 11   traMADol  (ULTRAM ) 50 MG tablet, Take 1 tablet (50 mg total) by mouth every 6 (six) hours as needed., Disp: 30 tablet, Rfl: 0  Current Facility-Administered Medications:    tezepelumab -ekko (TEZSPIRE ) 210 MG/1. syringe 210 mg, 210 mg, Subcutaneous, Q28 days, Brian Campanile, MD, 210 mg at 03/06/24 1607  Social History   Tobacco Use  Smoking Status Former   Current packs/day: 0.00   Average packs/day: 0.5 packs/day for 10.0 years (5.0 ttl pk-yrs)   Types: Cigarettes   Start date: 10/05/1983   Quit date: 10/04/1993   Years since quitting: 30.4  Smokeless Tobacco Never    No Known Allergies Objective:  There were no vitals filed for this visit. There is no height or weight on file to calculate BMI. Constitutional Well developed. Well nourished.  Vascular Dorsalis pedis pulses palpable bilaterally. Posterior tibial pulses palpable bilaterally. Capillary refill normal to all digits.  No cyanosis or clubbing noted. Pedal hair growth normal.  Neurologic Normal speech. Oriented to person, place, and time. Epicritic sensation to light touch grossly present bilaterally.  Dermatologic Nails well groomed and normal in appearance. No open wounds. No skin lesions.  Orthopedic: Pain on palpation of right  hallux IPJ pain with range of motion of the IPJ some crepitus noted.  Pain with range of motion.  No pain at the first metatarsal phalangeal joint noted.   Radiographs: None Assessment:   1. Capsulitis of toe, right   2. Degenerative arthritis of toe joint, right    Plan:  Patient was evaluated and treated and all questions answered.  Right hallux interphalangeal joint capsulitis with underlying arthritis - All questions and concerns were discussed with the patient in extensive detail clinically unable to appreciate some crepitus therefore  there is some arthritis as the area which likely is leading to inflammation given the amount of pain that he is experiencing he will benefit from steroid injection help decreasing inflammatory component associate with pain.  Patient agrees with plan elected proceed with steroid injection -A steroid injection was performed at right hallux IPJ using 1% plain Lidocaine  and 10 mg of Kenalog . This was well tolerated.   No follow-ups on file.

## 2024-03-27 ENCOUNTER — Other Ambulatory Visit: Payer: Self-pay

## 2024-03-27 ENCOUNTER — Other Ambulatory Visit: Payer: Self-pay | Admitting: Pharmacy Technician

## 2024-03-27 NOTE — Progress Notes (Signed)
 Specialty Pharmacy Refill Coordination Note  [name] is a 58 y.o. male assessed today regarding refills of clinic administered specialty medication(s) Tezepelumab -ekko (Tezspire )   Clinic requested Courier to Provider Office   Delivery date: 03/28/24   Verified address: [facility] [address] [place] [address]   Medication will be filled on 03/27/24.  Appt 6/30 Copay $0

## 2024-04-01 NOTE — Progress Notes (Unsigned)
 Follow Up Note  RE: [name] MRN: [id number] DOB: Feb 19, 1966 Date of Office Visit: 04/02/2024  Referring provider: Joshua Debby CROME, MD Primary care provider: Joshua Debby CROME, MD  Chief Complaint: No chief complaint on file.  History of Present Illness: I had the pleasure of seeing [name] for a [facility]  on 04/02/20[facility]. He is a 58 y.o. male, who is being followed for asthma on Tezspire  and allergic rhinitis. His previous allergy office visit was on 08/29/2023 with Dr. Luke. Today is a regular follow up visit.  Discussed the use of AI scribe software for clinical note transcription with the patient, who gave verbal consent to proceed.  History of Present Illness            ***  Assessment and Plan: [name] is a 58 y.o. male with: Severe persistent asthma without complication Past history - sarcoidosis.  Interim history - no prednisone .  Inconsistent use of Trelegy (1 puff 3-4 times per week) Today's spirometry showed moderate obstruction - some improvement from previous one.  Daily controller medication(s): take Trelegy 200mcg 1puff EVERYDAY and rinse mouth after each use.  If it's not covered let me know. Samples given.  Continue Tezspire  injection every 4 weeks.  Continue montelukast  (singulair ) 10mg  daily at night. May use levoalbuterol rescue inhaler 2 puffs or nebulizer every 4 to 6 hours as needed for shortness of breath, chest tightness, coughing, and wheezing. May use levoalbuterol rescue inhaler 2 puffs 5 to 15 minutes prior to strenuous physical activities. Monitor frequency of use - if you need to use it more than twice per week on a consistent basis let us  know.  Get spirometry at next visit.   Seasonal allergic rhinitis due to pollen Allergic rhinitis due to animal dander Allergic rhinitis due to dust mite Allergic rhinitis due to mold Allergy to cockroaches Past history - 2019 skin testing positive to  grass, weed, ragweed, trees, mold, dust mites, cockroach, cat and dog.  Interim history - well controlled with Singulair  daily.  Continue montelukast  (Singulair ) 10mg  daily at night. Use over the counter antihistamines such as Zyrtec (cetirizine), Claritin (loratadine), Allegra (fexofenadine), or Xyzal  (levocetirizine) daily as needed. May take twice a day during allergy flares. May switch antihistamines every few months. May use Ryaltris  (olopatadine  + mometasone  nasal spray combination) 1-2 sprays per nostril twice a day.    Elevated blood pressure Follow up with PCP regarding this. Assessment and Plan              No follow-ups on file.  No orders of the defined types were placed in this encounter.  Lab Orders  No laboratory test(s) ordered today    Diagnostics: Spirometry:  Tracings reviewed. His effort: {Blank single:19197::Good reproducible efforts.,It was hard to get consistent efforts and there is a question as to whether this reflects a maximal maneuver.,Poor effort, data can not be interpreted.} FVC: ***L FEV1: ***L, ***% predicted FEV1/FVC ratio: ***% Interpretation: {Blank single:19197::Spirometry consistent with mild obstructive disease,Spirometry consistent with moderate obstructive disease,Spirometry consistent with severe obstructive disease,Spirometry consistent with possible restrictive disease,Spirometry consistent with mixed obstructive and restrictive disease,Spirometry uninterpretable due to technique,Spirometry consistent with normal pattern,No overt abnormalities noted given today's efforts}.  Please see scanned spirometry results for details.  Skin Testing: {Blank single:19197::Select foods,Environmental allergy panel,Environmental allergy panel and select foods,Food allergy panel,None,Deferred due to recent antihistamines use}. *** Results discussed with patient/family.   Medication List:  Current Outpatient  Medications  Medication Sig Dispense Refill  . amLODipine  (NORVASC ) 5 MG tablet Take 1 tablet (5 mg total) by mouth daily. 90 tablet 0  . diclofenac  (VOLTAREN ) 75 MG EC tablet Take 1 tablet (75 mg total) by mouth 2 (two) times daily. 30 tablet 1  . indapamide  (LOZOL ) 1.25 MG tablet Take 1 tablet (1.25 mg total) by mouth daily. 90 tablet 0  . levalbuterol  (XOPENEX  HFA) 45 MCG/ACT inhaler Inhale 2 puffs into the lungs every 4 (four) hours as needed for wheezing or shortness of breath (coughing fits). 15 g 2  . levalbuterol  (XOPENEX ) 0.63 MG/3ML nebulizer solution Inhale 1 vial via nebulizer every 4 (four) hours as needed for wheezing or shortness of breath (coughing fits). 75 mL 1  . montelukast  (SINGULAIR ) 10 MG tablet Take 1 tablet (10 mg total) by mouth at bedtime. 90 tablet 2  . penicillin  v potassium (VEETID) 500 MG tablet Take 1 tablet (500 mg total) by mouth 4 (four) times daily until complete 28 tablet 0  . sildenafil  (VIAGRA ) 100 MG tablet Take 1 tablet (100 mg total) by mouth as directed as needed 30 tablet 11  . tezepelumab -ekko (TEZSPIRE ) 210 MG/1. syringe Inject 1.91 mLs (210 mg total) into the skin every 28 (twenty-eight) days. 1.91 mL 11  . traMADol  (ULTRAM ) 50 MG tablet Take 1 tablet (50 mg total) by mouth every 6 (six) hours as needed. 30 tablet 0   Current Facility-Administered Medications  Medication Dose Route Frequency Provider Last Rate Last Admin  . tezepelumab -ekko (TEZSPIRE ) 210 MG/1. syringe 210 mg  210 mg Subcutaneous Q28 days Jeneal Danita Macintosh, MD   210 mg at 03/06/24 1607   Allergies: No Known Allergies I reviewed his past medical history, social history, family history, and environmental history and no significant changes have been reported from his previous visit.  Review of Systems  Constitutional:  Negative for appetite change, chills, fever and unexpected weight change.  HENT:  Negative for congestion and rhinorrhea.   Eyes:  Negative for  itching.  Respiratory:  Negative for cough, chest tightness, shortness of breath and wheezing.   Cardiovascular:  Negative for chest pain.  Gastrointestinal:  Negative for abdominal pain.  Genitourinary:  Negative for difficulty urinating.  Skin:  Negative for rash.  Neurological:  Negative for headaches.   Objective: There were no vitals taken for this visit. There is no height or weight on file to calculate BMI. Physical Exam Vitals and nursing note reviewed.  Constitutional:      Appearance: Normal appearance. He is well-developed.  HENT:     Head: Normocephalic and atraumatic.     Right Ear: Tympanic membrane and external ear normal.     Left Ear: Tympanic membrane and external ear normal.     Nose: Nose normal.     Mouth/Throat:     Mouth: Mucous membranes are moist.     Pharynx: Oropharynx is clear.     Comments: Poor dentition  Eyes:     Conjunctiva/sclera: Conjunctivae normal.    Cardiovascular:     Rate and Rhythm: Normal rate and regular rhythm.     Heart sounds: Normal heart sounds. No murmur heard.    No friction rub. No gallop.  Pulmonary:     Effort: Pulmonary effort is normal.     Breath sounds: Normal breath sounds. No wheezing, rhonchi or rales.   Musculoskeletal:     Cervical back: Neck supple.   Skin:    General: Skin is warm.     Findings:  No rash.   Neurological:     Mental Status: He is alert and oriented to person, place, and time.   Psychiatric:        Behavior: Behavior normal.  Previous notes and tests were reviewed. The plan was reviewed with the patient/family, and all questions/concerned were addressed.  It was my pleasure to see [name] today and participate in his care. Please feel free to contact me with any questions or concerns.  Sincerely,  Orlan Cramp, DO Allergy & Immunology  [facility]  [facility] office: [phone] [facility] office: [phone]

## 2024-04-02 ENCOUNTER — Encounter: Payer: Self-pay | Admitting: Allergy

## 2024-04-02 ENCOUNTER — Other Ambulatory Visit: Payer: Self-pay

## 2024-04-02 ENCOUNTER — Ambulatory Visit (INDEPENDENT_AMBULATORY_CARE_PROVIDER_SITE_OTHER): Admitting: Allergy

## 2024-04-02 ENCOUNTER — Ambulatory Visit

## 2024-04-02 VITALS — BP 124/78 | HR 100 | Temp 97.8°F | Resp 18 | Ht 71.0 in | Wt 155.1 lb

## 2024-04-02 DIAGNOSIS — J3081 Allergic rhinitis due to animal (cat) (dog) hair and dander: Secondary | ICD-10-CM | POA: Diagnosis not present

## 2024-04-02 DIAGNOSIS — J301 Allergic rhinitis due to pollen: Secondary | ICD-10-CM

## 2024-04-02 DIAGNOSIS — J4550 Severe persistent asthma, uncomplicated: Secondary | ICD-10-CM | POA: Diagnosis not present

## 2024-04-02 DIAGNOSIS — J3089 Other allergic rhinitis: Secondary | ICD-10-CM

## 2024-04-02 DIAGNOSIS — Z91148 Patient's other noncompliance with medication regimen for other reason: Secondary | ICD-10-CM

## 2024-04-02 DIAGNOSIS — Z91038 Other insect allergy status: Secondary | ICD-10-CM

## 2024-04-02 NOTE — Patient Instructions (Addendum)
 Asthma: Daily controller medication(s): take Trelegy 200mcg 1puff EVERYDAY at night and rinse mouth after each use.  Sample given.  Continue Tezspire  injection every 4 weeks - given today.  Continue montelukast  (singulair ) 10mg  daily at night. May use levoalbuterol rescue inhaler 2 puffs or nebulizer every 4 to 6 hours as needed for shortness of breath, chest tightness, coughing, and wheezing. May use levoalbuterol rescue inhaler 2 puffs 5 to 15 minutes prior to strenuous physical activities. Monitor frequency of use - if you need to use it more than twice per week on a consistent basis let us  know.  Asthma control goals:  Full participation in all desired activities (may need albuterol  before activity) Albuterol  use two times or less a week on average (not counting use with activity) Cough interfering with sleep two times or less a month Oral steroids no more than once a year No hospitalizations  Perennial and seasonal allergic rhinitis Continue montelukast  (singulair ) 10mg  daily at night. Use over the counter antihistamines such as Zyrtec (cetirizine), Claritin (loratadine), Allegra (fexofenadine), or Xyzal  (levocetirizine) daily as needed. May take twice a day during allergy flares. May switch antihistamines every few months. May use Ryaltris  (olopatadine  + mometasone  nasal spray combination) 1-2 sprays per nostril twice a day.   Follow up in 4 months or sooner if needed.

## 2024-04-19 ENCOUNTER — Other Ambulatory Visit: Payer: Self-pay | Admitting: Pharmacy Technician

## 2024-04-19 ENCOUNTER — Other Ambulatory Visit: Payer: Self-pay

## 2024-04-19 NOTE — Progress Notes (Signed)
 Specialty Pharmacy Refill Coordination Note  [name] is a 58 y.o. male assessed today regarding refills of clinic administered specialty medication(s) Tezepelumab -ekko (Tezspire )   Clinic requested Courier to Provider Office   Delivery date: 04/24/24   Verified address: [facility] [address] [place], [place] [address]   Medication will be filled on 04/23/24.

## 2024-04-23 ENCOUNTER — Other Ambulatory Visit: Payer: Self-pay

## 2024-05-01 ENCOUNTER — Other Ambulatory Visit (HOSPITAL_COMMUNITY): Payer: Self-pay

## 2024-05-01 ENCOUNTER — Ambulatory Visit (INDEPENDENT_AMBULATORY_CARE_PROVIDER_SITE_OTHER)

## 2024-05-01 DIAGNOSIS — J4550 Severe persistent asthma, uncomplicated: Secondary | ICD-10-CM | POA: Diagnosis not present

## 2024-05-03 DIAGNOSIS — I10 Essential (primary) hypertension: Secondary | ICD-10-CM | POA: Diagnosis not present

## 2024-05-03 DIAGNOSIS — H5203 Hypermetropia, bilateral: Secondary | ICD-10-CM | POA: Diagnosis not present

## 2024-05-03 DIAGNOSIS — H524 Presbyopia: Secondary | ICD-10-CM | POA: Diagnosis not present

## 2024-05-03 DIAGNOSIS — H52221 Regular astigmatism, right eye: Secondary | ICD-10-CM | POA: Diagnosis not present

## 2024-05-14 ENCOUNTER — Other Ambulatory Visit: Payer: Self-pay

## 2024-05-18 ENCOUNTER — Other Ambulatory Visit: Payer: Self-pay

## 2024-05-18 ENCOUNTER — Other Ambulatory Visit (HOSPITAL_COMMUNITY): Payer: Self-pay

## 2024-05-18 NOTE — Progress Notes (Signed)
 Specialty Pharmacy Refill Coordination Note  Stephanie Mcglone is a 58 y.o. male assessed today regarding refills of clinic administered specialty medication(s) Tezepelumab-ekko (Tezspire)   Clinic requested Courier to Provider Office   Delivery date: 05/22/24   Injection date: 05/29/24  Verified address: [facility] [address] [place], [place] [address]   Medication will be filled on 05/21/24.

## 2024-05-21 ENCOUNTER — Other Ambulatory Visit: Payer: Self-pay

## 2024-05-29 ENCOUNTER — Ambulatory Visit (INDEPENDENT_AMBULATORY_CARE_PROVIDER_SITE_OTHER)

## 2024-05-29 DIAGNOSIS — J4550 Severe persistent asthma, uncomplicated: Secondary | ICD-10-CM

## 2024-05-31 ENCOUNTER — Other Ambulatory Visit (HOSPITAL_COMMUNITY): Payer: Self-pay

## 2024-06-11 ENCOUNTER — Other Ambulatory Visit: Payer: Self-pay

## 2024-06-11 NOTE — Progress Notes (Signed)
 Specialty Pharmacy Refill Coordination Note  [name] is a 58 y.o. male contacted today regarding refills of specialty medication(s) Tezepelumab -ekko (Tezspire )   Patient requested Courier to Provider Office   Delivery date: 06/19/24   Verified address: [facility] [address] [place], [place] [address]   Medication will be filled on 06/18/24.

## 2024-06-18 ENCOUNTER — Other Ambulatory Visit: Payer: Self-pay

## 2024-06-26 ENCOUNTER — Ambulatory Visit (INDEPENDENT_AMBULATORY_CARE_PROVIDER_SITE_OTHER)

## 2024-06-26 DIAGNOSIS — J4550 Severe persistent asthma, uncomplicated: Secondary | ICD-10-CM | POA: Diagnosis not present

## 2024-06-28 ENCOUNTER — Other Ambulatory Visit (HOSPITAL_COMMUNITY): Payer: Self-pay

## 2024-07-01 NOTE — Progress Notes (Deleted)
 Follow Up Note  RE: [name] MRN: [id number] DOB: 02/18/1966 Date of Office Visit: 07/02/2024  Referring provider: Joshua Debby CROME, MD Primary care provider: Joshua Debby CROME, MD  Chief Complaint: No chief complaint on file.  History of Present Illness: I had the pleasure of seeing [name] for a follow up visit at the [facility] on 07/02/2024. He is a 58 y.o. male, who is being followed for asthma on Tezspire , allergic rhinitis. His previous allergy office visit was on 04/02/2024 with Dr. Luke. Today is a regular follow up visit.  Discussed the use of AI scribe software for clinical note transcription with the patient, who gave verbal consent to proceed.  History of Present Illness            ***  Assessment and Plan: Teven is a 58 y.o. male with: Not well controlled severe persistent asthma Non compliance w medication regimen Past history - sarcoidosis.  Interim history - Non-compliance with Trelegy due to misinformation from mother in law. Wheezing exacerbated by heat and exertion. Not using rescue inhaler or nebulizer. Today's spirometry showed mixed obstructive and restrictive disease with 24% improvement in FEV1 post bronchodilator treatment. Clinically feeling improved - wheezing resolved.  Daily controller medication(s): take Trelegy 200mcg 1puff EVERYDAY at night and rinse mouth after each use.  Sample given.  Continue Tezspire  injection every 4 weeks - given today.  Continue montelukast  (singulair ) 10mg  daily at night. May use levoalbuterol rescue inhaler 2 puffs or nebulizer every 4 to 6 hours as needed for shortness of breath, chest tightness, coughing, and wheezing. May use levoalbuterol rescue inhaler 2 puffs 5 to 15 minutes prior to strenuous physical activities. Monitor frequency of use - if you need to use it more than twice per week on a consistent basis let us  know.  Get spirometry at next visit.   Seasonal allergic rhinitis  due to pollen Allergic rhinitis due to animal dander Allergic rhinitis due to dust mite Allergic rhinitis due to mold Allergy to cockroaches Past history - 2019 skin testing positive to grass, weed, ragweed, trees, mold, dust mites, cockroach, cat and dog.  Interim history - stable. Continue montelukast  (singulair ) 10mg  daily at night. Use over the counter antihistamines such as Zyrtec (cetirizine), Claritin (loratadine), Allegra (fexofenadine), or Xyzal  (levocetirizine) daily as needed. May take twice a day during allergy flares. May switch antihistamines every few months. May use Ryaltris  (olopatadine  + mometasone  nasal spray combination) 1-2 sprays per nostril twice a day.    Assessment and Plan              No follow-ups on file.  No orders of the defined types were placed in this encounter.  Lab Orders  No laboratory test(s) ordered today    Diagnostics: Spirometry:  Tracings reviewed. His effort: {Blank single:19197::Good reproducible efforts.,It was hard to get consistent efforts and there is a question as to whether this reflects a maximal maneuver.,Poor effort, data can not be interpreted.} FVC: ***L FEV1: ***L, ***% predicted FEV1/FVC ratio: ***% Interpretation: {Blank single:19197::Spirometry consistent with mild obstructive disease,Spirometry consistent with moderate obstructive disease,Spirometry consistent with severe obstructive disease,Spirometry consistent with possible restrictive disease,Spirometry consistent with mixed obstructive and restrictive disease,Spirometry uninterpretable due to technique,Spirometry consistent with normal pattern,No overt abnormalities noted given today's efforts}.  Please see scanned spirometry results for details.  Skin Testing: {Blank single:19197::Select foods,Environmental allergy panel,Environmental allergy panel and select foods,Food allergy panel,None,Deferred due to recent antihistamines  use}. *** Results discussed with patient/family.  Medication List:  Current Outpatient Medications  Medication Sig Dispense Refill  . amLODipine  (NORVASC ) 5 MG tablet Take 1 tablet (5 mg total) by mouth daily. 90 tablet 0  . diclofenac  (VOLTAREN ) 75 MG EC tablet Take 1 tablet (75 mg total) by mouth 2 (two) times daily. 30 tablet 1  . indapamide  (LOZOL ) 1.25 MG tablet Take 1 tablet (1.25 mg total) by mouth daily. 90 tablet 0  . levalbuterol  (XOPENEX  HFA) 45 MCG/ACT inhaler Inhale 2 puffs into the lungs every 4 (four) hours as needed for wheezing or shortness of breath (coughing fits). 15 g 2  . levalbuterol  (XOPENEX ) 0.63 MG/3ML nebulizer solution Inhale 1 vial via nebulizer every 4 (four) hours as needed for wheezing or shortness of breath (coughing fits). 75 mL 1  . montelukast  (SINGULAIR ) 10 MG tablet Take 1 tablet (10 mg total) by mouth at bedtime. 90 tablet 2  . penicillin  v potassium (VEETID) 500 MG tablet Take 1 tablet (500 mg total) by mouth 4 (four) times daily until complete 28 tablet 0  . sildenafil  (VIAGRA ) 100 MG tablet Take 1 tablet (100 mg total) by mouth as directed as needed 30 tablet 11  . tezepelumab -ekko (TEZSPIRE ) 210 MG/1. syringe Inject 1.91 mLs (210 mg total) into the skin every 28 (twenty-eight) days. 1.91 mL 11  . traMADol  (ULTRAM ) 50 MG tablet Take 1 tablet (50 mg total) by mouth every 6 (six) hours as needed. 30 tablet 0   Current Facility-Administered Medications  Medication Dose Route Frequency Provider Last Rate Last Admin  . tezepelumab -ekko (TEZSPIRE ) 210 MG/1. syringe 210 mg  210 mg Subcutaneous Q28 days Jeneal Danita Macintosh, MD   210 mg at 06/26/24 1321   Allergies: No Known Allergies I reviewed his past medical history, social history, family history, and environmental history and no significant changes have been reported from his previous visit.  Review of Systems  Constitutional:  Negative for appetite change, chills, fever and  unexpected weight change.  HENT:  Negative for congestion and rhinorrhea.   Eyes:  Negative for itching.  Respiratory:  Positive for wheezing. Negative for cough, chest tightness and shortness of breath.   Cardiovascular:  Negative for chest pain.  Gastrointestinal:  Negative for abdominal pain.  Genitourinary:  Negative for difficulty urinating.  Skin:  Negative for rash.  Neurological:  Negative for headaches.    Objective: There were no vitals taken for this visit. There is no height or weight on file to calculate BMI. Physical Exam Vitals and nursing note reviewed.  Constitutional:      Appearance: Normal appearance. He is well-developed.  HENT:     Head: Normocephalic and atraumatic.     Right Ear: Tympanic membrane and external ear normal.     Left Ear: Tympanic membrane and external ear normal.     Nose: Nose normal.     Mouth/Throat:     Mouth: Mucous membranes are moist.     Pharynx: Oropharynx is clear.     Comments: Poor dentition Eyes:     Conjunctiva/sclera: Conjunctivae normal.  Cardiovascular:     Rate and Rhythm: Normal rate and regular rhythm.     Heart sounds: Normal heart sounds. No murmur heard.    No friction rub. No gallop.  Pulmonary:     Effort: Pulmonary effort is normal.     Breath sounds: Wheezing (resolved after bronchodilator treatment) present. No rhonchi or rales.  Musculoskeletal:     Cervical back: Neck supple.  Skin:    General: Skin  is warm.     Findings: No rash.  Neurological:     Mental Status: He is alert and oriented to person, place, and time.  Psychiatric:        Behavior: Behavior normal.   Previous notes and tests were reviewed. The plan was reviewed with the patient/family, and all questions/concerned were addressed.  It was my pleasure to see [name] today and participate in his care. Please feel free to contact me with any questions or concerns.  Sincerely,  Orlan Cramp, DO Allergy & Immunology  [facility] [place]  [facility] office: [phone] [facility] office: [phone]

## 2024-07-02 ENCOUNTER — Ambulatory Visit: Admitting: Allergy

## 2024-07-06 ENCOUNTER — Telehealth: Payer: Self-pay | Admitting: Sports Medicine

## 2024-07-09 ENCOUNTER — Other Ambulatory Visit: Payer: Self-pay

## 2024-07-11 ENCOUNTER — Ambulatory Visit: Attending: Internal Medicine | Admitting: Pharmacist

## 2024-07-11 DIAGNOSIS — Z7189 Other specified counseling: Secondary | ICD-10-CM

## 2024-07-11 NOTE — Progress Notes (Signed)
   S: Patient presents today for review of their specialty medication.   Patient is taking Tezspire  for asthma. Patient is managed by Dr. Luke for this.   Dosing: 210 mg subq q4wks   Adherence: confirmed   Efficacy: reports good efficacy   Monitoring:  S/Sx of hypersensitivity: none PFTs: monitored by Dr. Mariella office S/sx of infection: none    Current adverse effects: none reported   O:     Lab Results  Component Value Date   WBC 4.7 12/26/2023   HGB 16.1 12/26/2023   HCT 45.2 12/26/2023   MCV 81.6 12/26/2023   PLT 216.0 12/26/2023      Chemistry      Component Value Date/Time   NA 138 12/26/2023 1056   K 3.9 12/26/2023 1056   CL 102 12/26/2023 1056   CO2 30 12/26/2023 1056   BUN 16 12/26/2023 1056   CREATININE 0.89 12/26/2023 1056   CREATININE 0.98 04/18/2013 1034      Component Value Date/Time   CALCIUM 9.7 12/26/2023 1056   ALKPHOS 62 12/26/2023 1056   AST 21 12/26/2023 1056   ALT 20 12/26/2023 1056   BILITOT 0.5 12/26/2023 1056       A/P: 1. Medication review: patient is taking Tezspire  for asthma with good efficacy. He is tolerating the Tezspire  well. We discussed the medication including the following: Tezepelumab -ekko is a human monoclonal antibody IgG2? that binds to human thymic stromal lymphopoietin (TSLP), an epithelial cytokine, and prevents human TSLP from interacting with the heterodimeric TSLP receptor. Blocking TSLP with tezepelumab -ekko reduces biomarkers and cytokines associated with inflammation including blood eosinophils, airway submucosal eosinophils, IgE, FeNO, IL-5, and IL-13; however, the mechanism of tezepelumab -ekko action in asthma has not been definitively established. Prior to administration, remove the vial or pre-filled syringe from the refrigerator and allow to reach room temperature for ~60 minutes (do not shake or expose to heat). Administer as a SUBQ injection into the upper arm, thigh, or lower abdomen (avoiding areas within 2  inches of navel). Rotate injection sites; do not inject into areas where the skin is tender, bruised, erythematous, or hardened. Adverse effects are uncommon but may include antibody development, joint pain, back pain, or pharyngitis. Patients should monitor for hypersensitivity reactions and infections. All questions and concerns were addressed. No recommendations for any changes at this time.   Herlene Fleeta Morris, PharmD, JAQUELINE, CPP Clinical Pharmacist [facility] [phone]

## 2024-07-12 ENCOUNTER — Encounter: Payer: Self-pay | Admitting: Sports Medicine

## 2024-07-12 ENCOUNTER — Other Ambulatory Visit (HOSPITAL_COMMUNITY): Payer: Self-pay

## 2024-07-12 ENCOUNTER — Other Ambulatory Visit (INDEPENDENT_AMBULATORY_CARE_PROVIDER_SITE_OTHER): Payer: Self-pay

## 2024-07-12 ENCOUNTER — Ambulatory Visit: Admitting: Sports Medicine

## 2024-07-12 DIAGNOSIS — M25522 Pain in left elbow: Secondary | ICD-10-CM | POA: Diagnosis not present

## 2024-07-12 DIAGNOSIS — M7712 Lateral epicondylitis, left elbow: Secondary | ICD-10-CM

## 2024-07-12 MED ORDER — METHYLPREDNISOLONE 4 MG PO TBPK
ORAL_TABLET | ORAL | 0 refills | Status: DC
  Filled 2024-07-12: qty 21, 6d supply, fill #0

## 2024-07-12 NOTE — Progress Notes (Signed)
 Patient says that he has been having left elbow pain for over a month. He works in the OR and does a lot of lifting and maneuvering, and believes that the repetitive motions have caused his pain. His pain has improved with Ibuprofen  and Voltaren  gel, and he says that he had a knot over the lateral elbow that has also decreased in size. It is still tender to touch the elbow and forearm, and he says that it feels hot compared to the right side. He denies any popping, clicking, or pulling in the elbow.

## 2024-07-12 NOTE — Progress Notes (Signed)
 [name] - 58 y.o. male MRN [id number]  Date of birth: Aug 08, 1966  Office Visit Note: Visit Date: 07/12/2024 PCP: Joshua Debby CROME, MD Referred by: Joshua Debby CROME, MD  Subjective: Chief Complaint  Patient presents with   Left Elbow - Pain   HPI: [name] is a very pleasant 58 y.o. male who presents today for evaluation of left elbow pain > 1 month. He is RHD.  Khamauri does work in the [place], he has been doing a lot of repetitive motions, lifting and maneuvering which has worsened his pain.  No specific injury or inciting event however.  He has had some improvement with ibuprofen  and Voltaren  gel.  Most of his pain is over the lateral aspect of the elbow and had tendinopathy in that location previously.  It is still tender to touch.  He denies any numbness tingling or radiating pain.  Pertinent ROS were reviewed with the patient and found to be negative unless otherwise specified above in HPI.   Assessment & Plan: Visit Diagnoses:  1. Lateral epicondylitis, left elbow   2. Pain in left elbow    Plan: Impression is greater than 1 month of left elbow pain which exam and symptomatology is indicative of lateral epicondylitis.  We discussed treatment options including bracing, rehab/PT, medication and injection therapy. This has gotten somewhat better with topical Voltaren  over-the-counter ibuprofen  but he still has some swelling here. I am less suspicious for any high-grade tearing given that he has preserved strength, more so pain secondary to provocative motions.  We will start him on a 6-day Medrol  Dosepak to help calm down the swelling and inflammation.  We did fit him for a counterforce elbow strap which he will wear with activity.  Did discuss activity modifications in the short-term and long-term.  Note provided for work for the next 1-2 days if needed.  We did print out a customized rehab handout and I did review exercises and stretches with him which he will do on a daily basis.   He will follow-up with me in 1 month if he is not significantly improved.  Additional considerations: Diagnostic ultrasound, injection therapy  Follow-up: Return for 66-month if not improved  .   Meds & Orders:  Meds ordered this encounter  Medications   methylPREDNISolone  (MEDROL  DOSEPAK) 4 MG TBPK tablet    Sig: Take per packet instruction. Taper dosing.    Dispense:  21 tablet    Refill:  0    Orders Placed This Encounter  Procedures   XR Elbow 2 Views Left     Procedures: No procedures performed      Clinical History: No specialty comments available.  He reports that he quit smoking about 30 years ago. His smoking use included cigarettes. He started smoking about 40 years ago. He has a 5 pack-year smoking history. He has never used smokeless tobacco. No results for input(s): HGBA1C, LABURIC in the last 8760 hours.  Objective:    Physical Exam  Gen: Well-appearing, in no acute distress; non-toxic CV: Well-perfused. Warm.  Resp: Breathing unlabored on room air; no wheezing. Psych: Fluid speech in conversation; appropriate affect; normal thought process  Ortho Exam - Left elbow: + TTP over the lateral epicondyle where there is a degree of soft tissue swelling and some warmth but no skin redness.  There is no elbow joint effusion or tenderness within the joint.  Full range of motion with flexion and extension.  There is pain with resisted wrist extension, positive  Maudsley's test, pain with resisted supination.  No varus or valgus instability.  Imaging: XR Elbow 2 Views Left Result Date: 07/12/2024 2 views of the left elbow including AP and lateral film were ordered and reviewed by myself today.  X-rays demonstrate mild radiocapitellar arthritic change with a small spur off the coronoid.  No advanced arthropathy.  No joint effusion.  No other acute bony abnormality.   Past Medical/Family/Surgical/Social History: Medications & Allergies reviewed per EMR, new medications  updated. Patient Active Problem List   Diagnosis Date Noted   Right sided abdominal pain 12/26/2023   Need for shingles vaccine 09/06/2023   Elevated total protein 09/06/2023   Encounter for general adult medical examination with abnormal findings 09/05/2023   Partial loss of tooth due to periodontal diseases 08/30/2022   Need for vaccination 08/26/2021   Colon cancer screening 03/21/2019   Avascular necrosis of bone of right hip (HCC) 11/01/2018   Avascular necrosis of bone of hip, left (HCC) 11/01/2018   Avascular necrosis of hip, unspecified laterality (HCC) 10/24/2018   Spinal stenosis at L4-L5 level 08/23/2018   Essential hypertension, benign 05/24/2016   DDD (degenerative disc disease), lumbar 02/04/2016   Alcohol abuse 01/20/2009   Perennial and seasonal allergic rhinitis 01/20/2009   Severe persistent asthma without complication (HCC) 01/02/2009   Sarcoidosis (HCC) 11/10/2007   Past Medical History:  Diagnosis Date   Asthma    Avascular necrosis (HCC)    Hypertension    Sarcoidosis    Sickle cell anemia (HCC)    Family History  Problem Relation Age of Onset   Asthma Mother    Coronary artery disease Mother    Stroke Mother    Heart attack Mother    Sickle cell trait Mother    Prostate cancer Father    Cancer Father 91       prostate   Alcohol abuse Brother    Early death Brother    Drug abuse Brother    Diabetes Neg Hx    Heart disease Neg Hx    Hyperlipidemia Neg Hx    Hypertension Neg Hx    Kidney disease Neg Hx    Past Surgical History:  Procedure Laterality Date   I & D EXTREMITY  01/19/2012   Procedure: IRRIGATION AND DEBRIDEMENT EXTREMITY; right foot, Surgeon: Jerona LULLA Sage, MD;  Location: [facility] OR;  Service: Orthopedics;  Laterality: Right;   SHOULDER OPEN ROTATOR CUFF REPAIR  2007   TOTAL HIP ARTHROPLASTY Right 11/10/2018   Procedure: RIGHT TOTAL HIP ARTHROPLASTY ANTERIOR APPROACH;  Surgeon: Vernetta Lonni GRADE, MD;  Location: [facility] ORS;  Service:  Orthopedics;  Laterality: Right;   Social History   Occupational History   Not on file  Tobacco Use   Smoking status: Former    Current packs/day: 0.00    Average packs/day: 0.5 packs/day for 10.0 years (5.0 ttl pk-yrs)    Types: Cigarettes    Start date: 10/05/1983    Quit date: 10/04/1993    Years since quitting: 30.7   Smokeless tobacco: Never  Vaping Use   Vaping status: Never Used  Substance and Sexual Activity   Alcohol use: Yes    Alcohol/week: 28.0 standard drinks of alcohol    Types: 14 Cans of beer, 14 Shots of liquor per week   Drug use: No   Sexual activity: Yes    Partners: Female   Patient was instructed in 10 minutes of therapeutic exercises for left elbow to improve strength, ROM and function according to  my instructions and plan of care during the office visit. A customized handout was provided and demonstration of proper technique shown and discussed. Patient did perform exercises and demonstrate understanding through teachback.  All questions discussed and answered.  Lonell Sprang, DO Primary Care Sports Medicine Physician  [facility] - Orthopedics  This note was dictated using Dragon naturally speaking software and may contain errors in syntax, spelling, or content which have not been identified prior to signing this note.

## 2024-07-16 ENCOUNTER — Other Ambulatory Visit (HOSPITAL_COMMUNITY): Payer: Self-pay

## 2024-07-16 ENCOUNTER — Other Ambulatory Visit: Payer: Self-pay

## 2024-07-16 ENCOUNTER — Other Ambulatory Visit: Payer: Self-pay | Admitting: Allergy

## 2024-07-16 ENCOUNTER — Other Ambulatory Visit: Payer: Self-pay | Admitting: Pharmacist

## 2024-07-16 MED ORDER — TEZSPIRE 210 MG/1.91ML ~~LOC~~ SOSY
210.0000 mg | PREFILLED_SYRINGE | SUBCUTANEOUS | 11 refills | Status: AC
  Filled 2024-07-16 – 2024-07-19 (×2): qty 1.91, 28d supply, fill #0
  Filled 2024-08-14: qty 1.91, 28d supply, fill #1
  Filled 2024-09-05: qty 1.91, 28d supply, fill #2
  Filled 2024-10-16: qty 1.91, 28d supply, fill #3
  Filled 2024-11-07: qty 1.91, 28d supply, fill #4

## 2024-07-16 MED ORDER — TEZSPIRE 210 MG/1.91ML ~~LOC~~ SOSY
210.0000 mg | PREFILLED_SYRINGE | SUBCUTANEOUS | 11 refills | Status: DC
  Filled 2024-07-16: qty 1.91, 28d supply, fill #0

## 2024-07-19 ENCOUNTER — Telehealth: Payer: Self-pay | Admitting: Sports Medicine

## 2024-07-19 ENCOUNTER — Other Ambulatory Visit: Payer: Self-pay

## 2024-07-19 ENCOUNTER — Other Ambulatory Visit: Payer: Self-pay | Admitting: Sports Medicine

## 2024-07-19 ENCOUNTER — Other Ambulatory Visit (HOSPITAL_COMMUNITY): Payer: Self-pay

## 2024-07-19 DIAGNOSIS — M7712 Lateral epicondylitis, left elbow: Secondary | ICD-10-CM

## 2024-07-19 MED ORDER — METHYLPREDNISOLONE 4 MG PO TBPK
ORAL_TABLET | ORAL | 0 refills | Status: AC
  Filled 2024-07-19: qty 21, 6d supply, fill #0

## 2024-07-19 NOTE — Progress Notes (Signed)
 Specialty Pharmacy Refill Coordination Note  Clear Bag Patient  [name] is a 58 y.o. male contacted today regarding refills of specialty medication(s) Tezepelumab -ekko (Tezspire )  Doses on hand: 0   Injection appointment: 07/24/24  Patient requested: Courier to Provider Office   Delivery date: 07/23/24   Verified address: [facility] [address] [place] [address]  Medication will be filled on 07/20/24.

## 2024-07-19 NOTE — Telephone Encounter (Signed)
 Patient called and said he lost his Medrol  dose pack and needs another one . CB#[phone]

## 2024-07-20 ENCOUNTER — Other Ambulatory Visit (HOSPITAL_COMMUNITY): Payer: Self-pay

## 2024-07-23 ENCOUNTER — Other Ambulatory Visit (HOSPITAL_COMMUNITY): Payer: Self-pay

## 2024-07-23 ENCOUNTER — Other Ambulatory Visit: Payer: Self-pay | Admitting: Internal Medicine

## 2024-07-23 DIAGNOSIS — M48061 Spinal stenosis, lumbar region without neurogenic claudication: Secondary | ICD-10-CM

## 2024-07-24 ENCOUNTER — Other Ambulatory Visit: Payer: Self-pay

## 2024-07-24 ENCOUNTER — Ambulatory Visit (INDEPENDENT_AMBULATORY_CARE_PROVIDER_SITE_OTHER)

## 2024-07-24 ENCOUNTER — Other Ambulatory Visit (HOSPITAL_COMMUNITY): Payer: Self-pay

## 2024-07-24 DIAGNOSIS — J4550 Severe persistent asthma, uncomplicated: Secondary | ICD-10-CM

## 2024-08-06 ENCOUNTER — Encounter: Payer: Self-pay | Admitting: Radiology

## 2024-08-09 ENCOUNTER — Other Ambulatory Visit: Payer: Self-pay

## 2024-08-14 ENCOUNTER — Other Ambulatory Visit: Payer: Self-pay

## 2024-08-14 ENCOUNTER — Other Ambulatory Visit: Payer: Self-pay | Admitting: Pharmacy Technician

## 2024-08-14 NOTE — Progress Notes (Signed)
 Specialty Pharmacy Refill Coordination Note  [name] is a 58 y.o. male assessed today regarding refills of clinic administered specialty medication(s) Tezepelumab -ekko (Tezspire )   Clinic requested Courier to Provider Office   Delivery date: 08/16/24   Verified address: [facility] [address] [place]   Medication will be filled on: 08/15/24

## 2024-08-15 ENCOUNTER — Other Ambulatory Visit: Payer: Self-pay

## 2024-08-21 ENCOUNTER — Ambulatory Visit (INDEPENDENT_AMBULATORY_CARE_PROVIDER_SITE_OTHER)

## 2024-08-21 DIAGNOSIS — J4550 Severe persistent asthma, uncomplicated: Secondary | ICD-10-CM | POA: Diagnosis not present

## 2024-08-27 ENCOUNTER — Telehealth: Payer: Self-pay | Admitting: Physician Assistant

## 2024-08-27 NOTE — Telephone Encounter (Signed)
 Scheduled with Tory on Wed. 12/3

## 2024-08-27 NOTE — Telephone Encounter (Signed)
 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 Please call pt at [phone].

## 2024-09-05 ENCOUNTER — Ambulatory Visit: Admitting: Physician Assistant

## 2024-09-05 ENCOUNTER — Encounter: Payer: Self-pay | Admitting: Physician Assistant

## 2024-09-05 ENCOUNTER — Other Ambulatory Visit: Payer: Self-pay

## 2024-09-05 ENCOUNTER — Ambulatory Visit: Admitting: Sports Medicine

## 2024-09-05 ENCOUNTER — Other Ambulatory Visit (HOSPITAL_COMMUNITY): Payer: Self-pay

## 2024-09-05 ENCOUNTER — Encounter: Payer: Self-pay | Admitting: Sports Medicine

## 2024-09-05 DIAGNOSIS — G8929 Other chronic pain: Secondary | ICD-10-CM

## 2024-09-05 DIAGNOSIS — M7712 Lateral epicondylitis, left elbow: Secondary | ICD-10-CM

## 2024-09-05 DIAGNOSIS — M25511 Pain in right shoulder: Secondary | ICD-10-CM

## 2024-09-05 MED ORDER — DICLOFENAC SODIUM 75 MG PO TBEC
75.0000 mg | DELAYED_RELEASE_TABLET | Freq: Two times a day (BID) | ORAL | 1 refills | Status: AC
  Filled 2024-09-05: qty 30, 15d supply, fill #0

## 2024-09-05 MED ORDER — BUPIVACAINE HCL 0.25 % IJ SOLN
2.0000 mL | INTRAMUSCULAR | Status: AC | PRN
  Administered 2024-09-05: 2 mL via INTRA_ARTICULAR

## 2024-09-05 MED ORDER — LIDOCAINE HCL 1 % IJ SOLN
2.0000 mL | INTRAMUSCULAR | Status: AC | PRN
  Administered 2024-09-05: 2 mL

## 2024-09-05 MED ORDER — METHYLPREDNISOLONE ACETATE 40 MG/ML IJ SUSP
80.0000 mg | INTRAMUSCULAR | Status: AC | PRN
  Administered 2024-09-05: 80 mg via INTRA_ARTICULAR

## 2024-09-05 NOTE — Progress Notes (Signed)
 HPI Saw comes in today due to right shoulder pain.  He last had an intra-articular injection right shoulder on 03/01/2024 and did well until just recently.  Pain is waking him up.  He has had no injuries.  He does find that whenever he is off from work the pain greatly decreases.  Right shoulder x-rays 3 views in May 2025 did show moderate narrowing of the glenohumeral joint.  Review of systems: Denies any fevers chills.  Physical exam: General Well-developed well-nourished male no acute distress. Bilateral shoulders: He has full overhead activity both shoulders with mild discomfort on the right.  Impingement testing is negative.  Extremes of external rotation right shoulder causes discomfort.  Passive internal/external rotation reveals mild crepitus right shoulder.  Bilateral 5 strength with external and internal rotation against resistance empty can test and liftoff test negative bilaterally. Left elbow: Tenderness over the lateral epicondyle.  Provocative maneuvers causes pain left lateral elbow.  Otherwise good range of motion elbow without pain.   Impression: Right shoulder osteoarthritis Left elbow lateral epicondylitis  Plan: Will have him undergo an ultrasound guided right shoulder intra-articular injection with Dr. Burnetta.  He can also has Mobic  without undergoing shockwave therapy for the lateral epicondylitis of the left elbow as he has had this in the past and was helpful.  He will otherwise follow-up with us  as needed.  Questions were encouraged and answered at length.  Refilled his diclofenac .

## 2024-09-05 NOTE — Progress Notes (Signed)
 Specialty Pharmacy Refill Coordination Note  [name] is a 58 y.o. male assessed today regarding refills of clinic administered specialty medication(s) Tezepelumab -ekko (Tezspire )   Clinic requested Courier to Provider Office   Delivery date: 09/13/24   Verified address: [facility] [address] [place]   Medication will be filled on: 09/12/24

## 2024-09-05 NOTE — Progress Notes (Signed)
   Procedure Note  Patient: [name]             Date of Birth: Apr 03, 1966           MRN: [id number]             Visit Date: 09/05/2024  Procedures: Visit Diagnoses:  1. Chronic right shoulder pain    Large Joint Inj: R glenohumeral on 09/05/2024 8:56 AM Indications: pain Details: 22 G 3.5 in needle, ultrasound-guided posterior approach Medications: 2 mL lidocaine  1 %; 2 mL bupivacaine  0.25 %; 80 mg methylPREDNISolone  acetate 40 MG/ML Outcome: tolerated well, no immediate complications  US -guided glenohumeral joint injection, right shoulder After discussion on risks/benefits/indications, informed verbal consent was obtained. A timeout was then performed. The patient was positioned lying lateral recumbent on examination table. The patient's shoulder was prepped with betadine and multiple alcohol swabs and utilizing ultrasound guidance, the patient's glenohumeral joint was identified on ultrasound. Using ultrasound guidance a 22-gauge, 3.5 inch needle with a mixture of 2:2:2 cc's lidocaine :bupivicaine:depomedrol was directed from a lateral to medial direction via in-plane technique into the glenohumeral joint with visualization of appropriate spread of injectate into the joint. Patient tolerated the procedure well without immediate complications.      Procedure, treatment alternatives, risks and benefits explained, specific risks discussed. Consent was given by the patient. Immediately prior to procedure a time out was called to verify the correct patient, procedure, equipment, support staff and site/side marked as required. Patient was prepped and draped in the usual sterile fashion.     - patient tolerated procedure well, discussed post-injection protocol - follow-up with Tory Melany Poli as indicated; I am happy to see them as needed  Lonell Sprang, DO Primary Care Sports Medicine Physician  [facility] - Orthopedics  This note was dictated using Dragon naturally  speaking software and may contain errors in syntax, spelling, or content which have not been identified prior to signing this note.

## 2024-09-07 ENCOUNTER — Telehealth: Payer: Self-pay | Admitting: Sports Medicine

## 2024-09-07 NOTE — Telephone Encounter (Signed)
 Patient called and said can he get a work note to return back to work Monday. His shoulder is still hurting. CB#[phone]

## 2024-09-07 NOTE — Telephone Encounter (Signed)
 Pt called stating [name] drew up a letter for his employer. Pt states he is unable to make it in today and asking for letter to be sent to his mychart. Pt number is [phone].

## 2024-09-12 ENCOUNTER — Other Ambulatory Visit: Payer: Self-pay

## 2024-09-18 ENCOUNTER — Ambulatory Visit

## 2024-09-18 ENCOUNTER — Other Ambulatory Visit (HOSPITAL_COMMUNITY): Payer: Self-pay

## 2024-09-24 ENCOUNTER — Ambulatory Visit (INDEPENDENT_AMBULATORY_CARE_PROVIDER_SITE_OTHER)

## 2024-09-24 DIAGNOSIS — J4550 Severe persistent asthma, uncomplicated: Secondary | ICD-10-CM | POA: Diagnosis not present

## 2024-10-01 ENCOUNTER — Other Ambulatory Visit: Payer: Self-pay

## 2024-10-16 ENCOUNTER — Other Ambulatory Visit: Payer: Self-pay

## 2024-10-16 NOTE — Progress Notes (Signed)
 Specialty Pharmacy Refill Coordination Note  Alvon Nygaard is a 59 y.o. male assessed today regarding refills of clinic administered specialty medication(s) Tezepelumab -ekko (Tezspire )   Clinic requested Courier to Provider Office   Delivery date: 10/18/24   Verified address: [facility] [address] [place]   Medication will be filled on: 10/17/24  Copay: $0.00 Appointment: 01.20.26

## 2024-10-17 ENCOUNTER — Other Ambulatory Visit: Payer: Self-pay

## 2024-10-18 ENCOUNTER — Other Ambulatory Visit (HOSPITAL_COMMUNITY): Payer: Self-pay

## 2024-10-23 ENCOUNTER — Ambulatory Visit

## 2024-10-23 DIAGNOSIS — J4550 Severe persistent asthma, uncomplicated: Secondary | ICD-10-CM

## 2024-10-29 ENCOUNTER — Other Ambulatory Visit: Payer: Self-pay

## 2024-10-31 ENCOUNTER — Other Ambulatory Visit: Payer: Self-pay

## 2024-10-31 ENCOUNTER — Encounter: Payer: Self-pay | Admitting: Family

## 2024-10-31 ENCOUNTER — Ambulatory Visit: Admitting: Family

## 2024-10-31 ENCOUNTER — Other Ambulatory Visit (HOSPITAL_COMMUNITY): Payer: Self-pay

## 2024-10-31 VITALS — BP 120/92 | HR 104 | Temp 98.0°F

## 2024-10-31 DIAGNOSIS — J3089 Other allergic rhinitis: Secondary | ICD-10-CM | POA: Diagnosis not present

## 2024-10-31 DIAGNOSIS — Z91148 Patient's other noncompliance with medication regimen for other reason: Secondary | ICD-10-CM

## 2024-10-31 DIAGNOSIS — J4550 Severe persistent asthma, uncomplicated: Secondary | ICD-10-CM

## 2024-10-31 DIAGNOSIS — H5789 Other specified disorders of eye and adnexa: Secondary | ICD-10-CM

## 2024-10-31 MED ORDER — LEVALBUTEROL TARTRATE 45 MCG/ACT IN AERO
2.0000 | INHALATION_SPRAY | RESPIRATORY_TRACT | 1 refills | Status: AC | PRN
  Filled 2024-10-31: qty 15, 25d supply, fill #0

## 2024-10-31 MED ORDER — MONTELUKAST SODIUM 10 MG PO TABS
10.0000 mg | ORAL_TABLET | Freq: Every day | ORAL | 1 refills | Status: AC
  Filled 2024-10-31: qty 90, 90d supply, fill #0

## 2024-10-31 MED ORDER — TRELEGY ELLIPTA 200-62.5-25 MCG/ACT IN AEPB
INHALATION_SPRAY | RESPIRATORY_TRACT | 5 refills | Status: AC
  Filled 2024-10-31: qty 60, 30d supply, fill #0

## 2024-10-31 MED ORDER — LEVALBUTEROL HCL 0.63 MG/3ML IN NEBU
0.6300 mg | INHALATION_SOLUTION | RESPIRATORY_TRACT | 1 refills | Status: AC | PRN
  Filled 2024-10-31: qty 75, 5d supply, fill #0

## 2024-10-31 NOTE — Progress Notes (Signed)
 "  [address] [place] [address] Dept: [phone]  FOLLOW UP NOTE  Patient ID: [name], male    DOB: [birth year]  Age: 59 y.o. MRN: [id number] Date of Office Visit: 10/31/2024  Assessment  Chief Complaint: Follow-up (Tezspire  renewal /C/o red eye )  HPI [name] is a 59 year old male who presents today for follow-up of not well controlled severe persistent asthma, noncompliance with medication regimen, seasonal allergic rhinitis due to pollen, allergic rhinitis due to animal dander, allergic rhinitis due to dust mite, allergic rhinitis due to mold, and allergy to cockroaches.  He was last seen on April 02, 2024 by Dr. Luke and did not follow-up in 4 months as recommended.  He is currently on steroids for left tennis elbow.  He has 2 days worth of steroids left.  Not well-controlled severe persistent asthma/noncompliance with medication regimen: He reports that he has not been taking his daily inhaler, Trelegy 200 mcg 1 puff daily, due to being hardheaded.  He knows that he needs to start back.  He is coughing now, only due to doing the breathing test, but prior to that he denies any cough, wheeze, tightness in chest, shortness of breath, and nocturnal awakenings due to breathing problems.  Since his last office visit he has not required any trips to the emergency room or urgent care due to breathing problems.  He is currently on steroids right now for left tennis elbow and has 2 days left.  He also has not been taking montelukast  10 mg at night.  He continues to receive Tezspire  injections every 4 weeks.  He does feel like these injections help his asthma.  He denies any problems or reactions with the Tezspire  injections.  He has not used his albuterol  in a while  Seasonal and perennial allergic rhinitis: He is currently not taking montelukast  10 mg daily and is not taking any antihistamines.  He has not used Ryaltris  nasal spray in a while.  He denies rhinorrhea, nasal congestion,  and postnasal drip.  He has not been treated for any sinus infections since we last saw him.  He reports that he woke up this morning with his left eye being red.  He denies the eye being itchy, painful, or having any crusting.  He does not wear contacts and is not know of any recent trauma.  He wonders whether he was rubbing it last night.  Bright light does not bother him.  He has not noticed any vision changes.  He does not have the sensation of something being in his eye or grittiness.  He also denies fever or chills.  He has seen [facility] in the past, but cannot remember what they are called now.  He was last seen by their office within the last 6 months.   Drug Allergies:  Allergies[1]  Review of Systems: Negative except as per HPI   Physical Exam: BP (!) 120/92   Pulse (!) 104   Temp 98 F (36.7 C)   SpO2 96%    Physical Exam Constitutional:      Appearance: Normal appearance.  HENT:     Head: Normocephalic and atraumatic.     Comments: Pharynx normal, eyes: Right eye normal, left eye: Conjunctival injection present.  No discharge, crusting, or foreign object noted.  Ears normal, nose: Bilateral lower turbinates mildly edematous and slightly erythematous with no drainage noted    Right Ear: Tympanic membrane, ear canal and external ear normal.  Left Ear: Tympanic membrane, ear canal and external ear normal.     Mouth/Throat:     Mouth: Mucous membranes are moist.     Pharynx: Oropharynx is clear.  Cardiovascular:     Rate and Rhythm: Regular rhythm.     Heart sounds: Normal heart sounds.  Pulmonary:     Comments: Lungs clear to auscultation, but diminished at the bases. Musculoskeletal:     Cervical back: Neck supple.  Skin:    General: Skin is warm.  Neurological:     Mental Status: He is alert and oriented to person, place, and time.  Psychiatric:        Mood and Affect: Mood normal.        Behavior: Behavior normal.        Thought Content: Thought  content normal.        Judgment: Judgment normal.     Diagnostics: FVC 2.45 L (58%), FEV1 1.43 L (43%), FEV1/FVC 0.58.  Spirometry indicates moderate airway obstruction.  4 puffs of Xopenex  given.  Postbronchodilator response shows FVC 3.28 L (78%), FEV1 2.12 L (64%), FEV1/FVC 0.65.  Spirometry indicates mild airway obstruction.  There is a 48% change in FEV1.  He reports that his breathing feels better after the 4 puffs of Xopenex .  Assessment and Plan: 1. Perennial and seasonal allergic rhinitis   2. Not well controlled severe persistent asthma (HCC)   3. Non compliance w medication regimen   4. Redness of left eye     Meds ordered this encounter  Medications   levalbuterol  (XOPENEX  HFA) 45 MCG/ACT inhaler    Sig: Inhale 2 puffs into the lungs every 4 (four) hours as needed for wheezing or shortness of breath (coughing fits).    Dispense:  15 g    Refill:  1   levalbuterol  (XOPENEX ) 0.63 MG/3ML nebulizer solution    Sig: Inhale 1 vial via nebulizer every 4 (four) hours as needed for wheezing or shortness of breath (coughing fits).    Dispense:  75 mL    Refill:  1   montelukast  (SINGULAIR ) 10 MG tablet    Sig: Take 1 tablet (10 mg total) by mouth at bedtime.    Dispense:  90 tablet    Refill:  1   Fluticasone -Umeclidin-Vilant (TRELEGY ELLIPTA ) 200-62.5-25 MCG/ACT AEPB    Sig: Inhale 1 puff once a day.  Rinse mouth out afterwards    Dispense:  28 each    Refill:  5    Patient Instructions  Asthma:not well controlled Your breathing test showed moderate obstruction, but after 4 puffs of Xopenex  your FEV1 improved 48%.  Strongly recommend using your Trelegy 200 mcg 1 puff once a day Continue current steroids by your orthopedic for left tennis elbow Daily controller medication(s): Start Trelegy 200mcg 1puff EVERYDAY at night and rinse mouth after each use. You can set reminders on your phone or set the inhaler next to your tooth brush as a reminder 2 Samples given.  Continue  Tezspire  injection every 4 weeks -  Restart montelukast  (singulair ) 10mg  daily at night. Patient cautioned that rarely some children/adults can experience behavioral changes after beginning montelukast . These side effects are rare, however, if you notice any change, notify the clinic and discontinue montelukast . May use levoalbuterol rescue inhaler 2 puffs or nebulizer every 4 to 6 hours as needed for shortness of breath, chest tightness, coughing, and wheezing. May use levoalbuterol rescue inhaler 2 puffs 5 to 15 minutes prior to strenuous physical activities. Monitor frequency  of use - if you need to use it more than twice per week on a consistent basis let us  know.  Asthma control goals:  Full participation in all desired activities (may need albuterol  before activity) Albuterol  use two times or less a week on average (not counting use with activity) Cough interfering with sleep two times or less a month Oral steroids no more than once a year No hospitalizations  Perennial and seasonal allergic rhinitis Continue montelukast  (singulair ) 10mg  daily at night. Use over the counter antihistamines such as Zyrtec (cetirizine), Claritin (loratadine), Allegra (fexofenadine), or Xyzal  (levocetirizine) daily as needed. May take twice a day during allergy flares. May switch antihistamines every few months. May use Ryaltris  (olopatadine  + mometasone  nasal spray combination) 1-2 sprays per nostril twice a day.    Left eye redness- denies photophobia, vision changes, trauma, pain, itchy, crusting fever, and chills.  He does not wear contacts and bright lights do not bother him.  He also denies foreign body sensation/grittiness - Recommend scheduling an appointment with your eye doctor to discuss your left eye redness.  I do not see any foreign objects upon examination  Follow up in 3 months or sooner if needed.  Return in about 3 months (around 01/29/2025).    Thank you for the opportunity to care for  this patient.  Please do not hesitate to contact me with questions.  Wanda Craze, FNP [facility]         [1] No Known Allergies  "

## 2024-10-31 NOTE — Patient Instructions (Addendum)
 Asthma:not well controlled Your breathing test showed moderate obstruction, but after 4 puffs of Xopenex  your FEV1 improved 48%.  Strongly recommend using your Trelegy 200 mcg 1 puff once a day Continue current steroids by your orthopedic for left tennis elbow Daily controller medication(s): Start Trelegy 200mcg 1puff EVERYDAY at night and rinse mouth after each use. You can set reminders on your phone or set the inhaler next to your tooth brush as a reminder 2 Samples given.  Continue Tezspire  injection every 4 weeks -  Restart montelukast  (singulair ) 10mg  daily at night. Patient cautioned that rarely some children/adults can experience behavioral changes after beginning montelukast . These side effects are rare, however, if you notice any change, notify the clinic and discontinue montelukast . May use levoalbuterol rescue inhaler 2 puffs or nebulizer every 4 to 6 hours as needed for shortness of breath, chest tightness, coughing, and wheezing. May use levoalbuterol rescue inhaler 2 puffs 5 to 15 minutes prior to strenuous physical activities. Monitor frequency of use - if you need to use it more than twice per week on a consistent basis let us  know.  Asthma control goals:  Full participation in all desired activities (may need albuterol  before activity) Albuterol  use two times or less a week on average (not counting use with activity) Cough interfering with sleep two times or less a month Oral steroids no more than once a year No hospitalizations  Perennial and seasonal allergic rhinitis Continue montelukast  (singulair ) 10mg  daily at night. Use over the counter antihistamines such as Zyrtec (cetirizine), Claritin (loratadine), Allegra (fexofenadine), or Xyzal  (levocetirizine) daily as needed. May take twice a day during allergy flares. May switch antihistamines every few months. May use Ryaltris  (olopatadine  + mometasone  nasal spray combination) 1-2 sprays per nostril twice a day.    Left eye  redness- denies photophobia, vision changes, trauma, pain, itchy, crusting fever, and chills.  He does not wear contacts and bright lights do not bother him.  He also denies foreign body sensation/grittiness - Recommend scheduling an appointment with your eye doctor to discuss your left eye redness.  I do not see any foreign objects upon examination  Follow up in 3 months or sooner if needed.

## 2024-11-07 ENCOUNTER — Other Ambulatory Visit: Payer: Self-pay

## 2024-11-07 ENCOUNTER — Other Ambulatory Visit: Payer: Self-pay | Admitting: Pharmacy Technician

## 2024-11-07 NOTE — Progress Notes (Signed)
 Specialty Pharmacy Refill Coordination Note  [name] is a 59 y.o. male assessed today regarding refills of clinic administered specialty medication(s) Tezepelumab -ekko (Tezspire )   Clinic requested Courier to Provider Office   Delivery date: 11/14/24   Verified address: [facility] [address] [place]   Medication will be filled on: 11/13/24

## 2024-11-20 ENCOUNTER — Ambulatory Visit

## 2025-01-29 ENCOUNTER — Ambulatory Visit: Admitting: Family
